# Patient Record
Sex: Female | Born: 1935 | Race: White | Hispanic: No | Smoking: Former smoker
Health system: Southern US, Community
[De-identification: ages and names within clinical notes are randomized; demographics above are authoritative.]

## PROBLEM LIST (undated history)

## (undated) DIAGNOSIS — Z8619 Personal history of other infectious and parasitic diseases: Secondary | ICD-10-CM

## (undated) DIAGNOSIS — R0600 Dyspnea, unspecified: Secondary | ICD-10-CM

## (undated) DIAGNOSIS — Z87442 Personal history of urinary calculi: Secondary | ICD-10-CM

## (undated) DIAGNOSIS — M1990 Unspecified osteoarthritis, unspecified site: Secondary | ICD-10-CM

## (undated) DIAGNOSIS — N189 Chronic kidney disease, unspecified: Secondary | ICD-10-CM

## (undated) DIAGNOSIS — G500 Trigeminal neuralgia: Secondary | ICD-10-CM

## (undated) DIAGNOSIS — K5090 Crohn's disease, unspecified, without complications: Secondary | ICD-10-CM

## (undated) DIAGNOSIS — E785 Hyperlipidemia, unspecified: Secondary | ICD-10-CM

## (undated) DIAGNOSIS — I509 Heart failure, unspecified: Secondary | ICD-10-CM

## (undated) DIAGNOSIS — I2510 Atherosclerotic heart disease of native coronary artery without angina pectoris: Secondary | ICD-10-CM

## (undated) DIAGNOSIS — F419 Anxiety disorder, unspecified: Secondary | ICD-10-CM

## (undated) DIAGNOSIS — I10 Essential (primary) hypertension: Secondary | ICD-10-CM

## (undated) DIAGNOSIS — N200 Calculus of kidney: Secondary | ICD-10-CM

## (undated) DIAGNOSIS — J449 Chronic obstructive pulmonary disease, unspecified: Secondary | ICD-10-CM

## (undated) DIAGNOSIS — K219 Gastro-esophageal reflux disease without esophagitis: Secondary | ICD-10-CM

## (undated) DIAGNOSIS — D649 Anemia, unspecified: Secondary | ICD-10-CM

## (undated) DIAGNOSIS — Z8489 Family history of other specified conditions: Secondary | ICD-10-CM

## (undated) DIAGNOSIS — I209 Angina pectoris, unspecified: Secondary | ICD-10-CM

## (undated) HISTORY — DX: Chronic obstructive pulmonary disease, unspecified: J44.9

## (undated) HISTORY — PX: OTHER SURGICAL HISTORY: SHX169

## (undated) HISTORY — PX: CHOLECYSTECTOMY: SHX55

## (undated) HISTORY — DX: Essential (primary) hypertension: I10

## (undated) HISTORY — PX: URETERAL STENT PLACEMENT: SHX822

## (undated) HISTORY — DX: Personal history of urinary calculi: Z87.442

## (undated) HISTORY — DX: Gastro-esophageal reflux disease without esophagitis: K21.9

## (undated) HISTORY — DX: Hyperlipidemia, unspecified: E78.5

## (undated) HISTORY — DX: Crohn's disease, unspecified, without complications: K50.90

## (undated) HISTORY — DX: Dyspnea, unspecified: R06.00

## (undated) HISTORY — PX: LITHOTRIPSY: SUR834

## (undated) HISTORY — DX: Atherosclerotic heart disease of native coronary artery without angina pectoris: I25.10

## (undated) HISTORY — PX: TOTAL ABDOMINAL HYSTERECTOMY: SHX209

## (undated) HISTORY — DX: Trigeminal neuralgia: G50.0

## (undated) HISTORY — DX: Personal history of other infectious and parasitic diseases: Z86.19

---

## 1997-09-24 ENCOUNTER — Emergency Department (HOSPITAL_COMMUNITY): Admission: EM | Admit: 1997-09-24 | Discharge: 1997-09-24 | Payer: Self-pay | Admitting: Emergency Medicine

## 1997-09-26 ENCOUNTER — Ambulatory Visit (HOSPITAL_COMMUNITY): Admission: RE | Admit: 1997-09-26 | Discharge: 1997-09-26 | Payer: Self-pay | Admitting: Urology

## 1998-07-12 ENCOUNTER — Emergency Department (HOSPITAL_COMMUNITY): Admission: EM | Admit: 1998-07-12 | Discharge: 1998-07-12 | Payer: Self-pay | Admitting: Emergency Medicine

## 1998-07-12 ENCOUNTER — Encounter: Payer: Self-pay | Admitting: Emergency Medicine

## 1998-12-16 ENCOUNTER — Encounter (INDEPENDENT_AMBULATORY_CARE_PROVIDER_SITE_OTHER): Payer: Self-pay | Admitting: Specialist

## 1998-12-16 ENCOUNTER — Encounter: Payer: Self-pay | Admitting: Gastroenterology

## 1998-12-16 ENCOUNTER — Ambulatory Visit (HOSPITAL_COMMUNITY): Admission: RE | Admit: 1998-12-16 | Discharge: 1998-12-16 | Payer: Self-pay | Admitting: Gastroenterology

## 1999-03-11 ENCOUNTER — Encounter: Admission: RE | Admit: 1999-03-11 | Discharge: 1999-03-11 | Payer: Self-pay | Admitting: Gastroenterology

## 1999-03-11 ENCOUNTER — Encounter: Payer: Self-pay | Admitting: Gastroenterology

## 1999-05-15 ENCOUNTER — Ambulatory Visit (HOSPITAL_COMMUNITY): Admission: RE | Admit: 1999-05-15 | Discharge: 1999-05-15 | Payer: Self-pay | Admitting: Gastroenterology

## 1999-05-15 ENCOUNTER — Encounter (INDEPENDENT_AMBULATORY_CARE_PROVIDER_SITE_OTHER): Payer: Self-pay | Admitting: Specialist

## 1999-07-03 ENCOUNTER — Encounter: Admission: RE | Admit: 1999-07-03 | Discharge: 1999-07-03 | Payer: Self-pay | Admitting: Gastroenterology

## 1999-07-03 ENCOUNTER — Encounter: Payer: Self-pay | Admitting: Gastroenterology

## 1999-11-17 ENCOUNTER — Encounter (HOSPITAL_COMMUNITY): Admission: RE | Admit: 1999-11-17 | Discharge: 2000-02-15 | Payer: Self-pay | Admitting: Internal Medicine

## 1999-12-22 ENCOUNTER — Encounter: Admission: RE | Admit: 1999-12-22 | Discharge: 1999-12-22 | Payer: Self-pay | Admitting: Gastroenterology

## 1999-12-22 ENCOUNTER — Encounter: Payer: Self-pay | Admitting: Gastroenterology

## 1999-12-31 ENCOUNTER — Encounter: Payer: Self-pay | Admitting: Gastroenterology

## 1999-12-31 ENCOUNTER — Encounter: Admission: RE | Admit: 1999-12-31 | Discharge: 1999-12-31 | Payer: Self-pay | Admitting: Gastroenterology

## 2000-02-16 ENCOUNTER — Encounter (HOSPITAL_COMMUNITY): Admission: RE | Admit: 2000-02-16 | Discharge: 2000-05-16 | Payer: Self-pay | Admitting: Internal Medicine

## 2000-12-06 ENCOUNTER — Ambulatory Visit (HOSPITAL_COMMUNITY): Admission: RE | Admit: 2000-12-06 | Discharge: 2000-12-07 | Payer: Self-pay | Admitting: *Deleted

## 2000-12-13 ENCOUNTER — Ambulatory Visit (HOSPITAL_COMMUNITY): Admission: RE | Admit: 2000-12-13 | Discharge: 2000-12-13 | Payer: Self-pay | Admitting: *Deleted

## 2000-12-17 ENCOUNTER — Encounter: Payer: Self-pay | Admitting: Cardiology

## 2000-12-17 ENCOUNTER — Inpatient Hospital Stay (HOSPITAL_COMMUNITY): Admission: EM | Admit: 2000-12-17 | Discharge: 2000-12-19 | Payer: Self-pay | Admitting: Emergency Medicine

## 2001-01-03 ENCOUNTER — Encounter: Payer: Self-pay | Admitting: Gastroenterology

## 2001-01-03 ENCOUNTER — Encounter: Admission: RE | Admit: 2001-01-03 | Discharge: 2001-01-03 | Payer: Self-pay | Admitting: Gastroenterology

## 2001-12-28 ENCOUNTER — Ambulatory Visit (HOSPITAL_COMMUNITY): Admission: RE | Admit: 2001-12-28 | Discharge: 2001-12-28 | Payer: Self-pay | Admitting: Internal Medicine

## 2002-01-04 ENCOUNTER — Encounter: Admission: RE | Admit: 2002-01-04 | Discharge: 2002-01-04 | Payer: Self-pay | Admitting: Gastroenterology

## 2002-01-04 ENCOUNTER — Encounter: Payer: Self-pay | Admitting: Gastroenterology

## 2002-07-27 ENCOUNTER — Encounter: Payer: Self-pay | Admitting: Internal Medicine

## 2002-07-27 ENCOUNTER — Ambulatory Visit (HOSPITAL_COMMUNITY): Admission: RE | Admit: 2002-07-27 | Discharge: 2002-07-27 | Payer: Self-pay | Admitting: Internal Medicine

## 2003-01-07 ENCOUNTER — Encounter: Admission: RE | Admit: 2003-01-07 | Discharge: 2003-01-07 | Payer: Self-pay | Admitting: Gastroenterology

## 2003-01-07 ENCOUNTER — Encounter: Payer: Self-pay | Admitting: Gastroenterology

## 2003-10-03 ENCOUNTER — Ambulatory Visit (HOSPITAL_COMMUNITY): Admission: RE | Admit: 2003-10-03 | Discharge: 2003-10-03 | Payer: Self-pay | Admitting: Interventional Cardiology

## 2003-10-10 ENCOUNTER — Inpatient Hospital Stay (HOSPITAL_BASED_OUTPATIENT_CLINIC_OR_DEPARTMENT_OTHER): Admission: RE | Admit: 2003-10-10 | Discharge: 2003-10-10 | Payer: Self-pay | Admitting: Interventional Cardiology

## 2004-01-10 ENCOUNTER — Encounter: Admission: RE | Admit: 2004-01-10 | Discharge: 2004-01-10 | Payer: Self-pay | Admitting: Gastroenterology

## 2004-04-27 ENCOUNTER — Ambulatory Visit: Payer: Self-pay | Admitting: Internal Medicine

## 2004-05-22 ENCOUNTER — Ambulatory Visit: Payer: Self-pay | Admitting: Internal Medicine

## 2004-07-09 ENCOUNTER — Ambulatory Visit: Payer: Self-pay | Admitting: Internal Medicine

## 2004-08-04 ENCOUNTER — Ambulatory Visit: Payer: Self-pay | Admitting: Internal Medicine

## 2004-09-18 ENCOUNTER — Ambulatory Visit: Payer: Self-pay | Admitting: Internal Medicine

## 2004-09-20 ENCOUNTER — Emergency Department (HOSPITAL_COMMUNITY): Admission: EM | Admit: 2004-09-20 | Discharge: 2004-09-20 | Payer: Self-pay | Admitting: Emergency Medicine

## 2004-11-13 ENCOUNTER — Ambulatory Visit: Payer: Self-pay | Admitting: Internal Medicine

## 2004-11-16 ENCOUNTER — Ambulatory Visit: Payer: Self-pay | Admitting: Cardiology

## 2004-11-16 ENCOUNTER — Ambulatory Visit: Payer: Self-pay | Admitting: Internal Medicine

## 2004-11-24 ENCOUNTER — Ambulatory Visit: Payer: Self-pay | Admitting: Internal Medicine

## 2005-01-22 ENCOUNTER — Encounter: Admission: RE | Admit: 2005-01-22 | Discharge: 2005-01-22 | Payer: Self-pay | Admitting: Gastroenterology

## 2005-02-08 ENCOUNTER — Ambulatory Visit: Payer: Self-pay | Admitting: Internal Medicine

## 2005-08-09 ENCOUNTER — Ambulatory Visit: Payer: Self-pay | Admitting: Internal Medicine

## 2006-01-26 ENCOUNTER — Encounter: Admission: RE | Admit: 2006-01-26 | Discharge: 2006-01-26 | Payer: Self-pay | Admitting: Gastroenterology

## 2006-02-07 ENCOUNTER — Ambulatory Visit: Payer: Self-pay | Admitting: Internal Medicine

## 2006-03-11 ENCOUNTER — Ambulatory Visit: Payer: Self-pay | Admitting: Internal Medicine

## 2006-09-07 ENCOUNTER — Ambulatory Visit: Payer: Self-pay | Admitting: Internal Medicine

## 2006-10-19 ENCOUNTER — Ambulatory Visit: Payer: Self-pay | Admitting: Internal Medicine

## 2007-02-01 ENCOUNTER — Encounter: Admission: RE | Admit: 2007-02-01 | Discharge: 2007-02-01 | Payer: Self-pay | Admitting: Gastroenterology

## 2007-03-21 ENCOUNTER — Encounter: Payer: Self-pay | Admitting: Internal Medicine

## 2007-03-21 DIAGNOSIS — I2510 Atherosclerotic heart disease of native coronary artery without angina pectoris: Secondary | ICD-10-CM

## 2007-03-21 DIAGNOSIS — K5090 Crohn's disease, unspecified, without complications: Secondary | ICD-10-CM | POA: Insufficient documentation

## 2007-03-21 DIAGNOSIS — J449 Chronic obstructive pulmonary disease, unspecified: Secondary | ICD-10-CM | POA: Insufficient documentation

## 2007-03-21 DIAGNOSIS — K219 Gastro-esophageal reflux disease without esophagitis: Secondary | ICD-10-CM

## 2007-03-21 DIAGNOSIS — J309 Allergic rhinitis, unspecified: Secondary | ICD-10-CM | POA: Insufficient documentation

## 2007-03-21 DIAGNOSIS — R0602 Shortness of breath: Secondary | ICD-10-CM

## 2007-03-21 DIAGNOSIS — I10 Essential (primary) hypertension: Secondary | ICD-10-CM

## 2007-04-21 ENCOUNTER — Ambulatory Visit: Payer: Self-pay | Admitting: Internal Medicine

## 2007-04-27 ENCOUNTER — Ambulatory Visit (HOSPITAL_COMMUNITY): Admission: RE | Admit: 2007-04-27 | Discharge: 2007-04-27 | Payer: Self-pay | Admitting: Urology

## 2007-04-30 ENCOUNTER — Emergency Department (HOSPITAL_COMMUNITY): Admission: EM | Admit: 2007-04-30 | Discharge: 2007-04-30 | Payer: Self-pay | Admitting: Emergency Medicine

## 2007-05-02 ENCOUNTER — Ambulatory Visit (HOSPITAL_BASED_OUTPATIENT_CLINIC_OR_DEPARTMENT_OTHER): Admission: RE | Admit: 2007-05-02 | Discharge: 2007-05-02 | Payer: Self-pay | Admitting: Urology

## 2007-05-02 ENCOUNTER — Other Ambulatory Visit: Payer: Self-pay | Admitting: Urology

## 2007-05-17 ENCOUNTER — Telehealth (INDEPENDENT_AMBULATORY_CARE_PROVIDER_SITE_OTHER): Payer: Self-pay | Admitting: *Deleted

## 2007-06-09 ENCOUNTER — Telehealth (INDEPENDENT_AMBULATORY_CARE_PROVIDER_SITE_OTHER): Payer: Self-pay | Admitting: *Deleted

## 2007-09-20 ENCOUNTER — Observation Stay (HOSPITAL_COMMUNITY): Admission: AD | Admit: 2007-09-20 | Discharge: 2007-09-21 | Payer: Self-pay | Admitting: Interventional Cardiology

## 2007-10-20 ENCOUNTER — Ambulatory Visit: Payer: Self-pay | Admitting: Internal Medicine

## 2007-12-07 ENCOUNTER — Encounter: Payer: Self-pay | Admitting: Internal Medicine

## 2007-12-07 ENCOUNTER — Telehealth (INDEPENDENT_AMBULATORY_CARE_PROVIDER_SITE_OTHER): Payer: Self-pay | Admitting: *Deleted

## 2007-12-28 ENCOUNTER — Encounter: Admission: RE | Admit: 2007-12-28 | Discharge: 2007-12-28 | Payer: Self-pay | Admitting: Internal Medicine

## 2008-01-03 ENCOUNTER — Telehealth: Payer: Self-pay | Admitting: Internal Medicine

## 2008-02-02 ENCOUNTER — Encounter: Admission: RE | Admit: 2008-02-02 | Discharge: 2008-02-02 | Payer: Self-pay | Admitting: Gastroenterology

## 2008-05-07 ENCOUNTER — Telehealth (INDEPENDENT_AMBULATORY_CARE_PROVIDER_SITE_OTHER): Payer: Self-pay | Admitting: *Deleted

## 2008-10-01 ENCOUNTER — Emergency Department (HOSPITAL_COMMUNITY): Admission: EM | Admit: 2008-10-01 | Discharge: 2008-10-02 | Payer: Self-pay | Admitting: Emergency Medicine

## 2008-10-02 ENCOUNTER — Telehealth (INDEPENDENT_AMBULATORY_CARE_PROVIDER_SITE_OTHER): Payer: Self-pay | Admitting: *Deleted

## 2008-10-08 ENCOUNTER — Ambulatory Visit: Payer: Self-pay | Admitting: Internal Medicine

## 2008-10-22 ENCOUNTER — Ambulatory Visit: Payer: Self-pay | Admitting: Internal Medicine

## 2008-10-25 ENCOUNTER — Encounter: Payer: Self-pay | Admitting: Internal Medicine

## 2008-12-19 ENCOUNTER — Telehealth (INDEPENDENT_AMBULATORY_CARE_PROVIDER_SITE_OTHER): Payer: Self-pay | Admitting: *Deleted

## 2008-12-20 ENCOUNTER — Encounter: Payer: Self-pay | Admitting: Internal Medicine

## 2009-02-05 ENCOUNTER — Encounter: Admission: RE | Admit: 2009-02-05 | Discharge: 2009-02-05 | Payer: Self-pay | Admitting: Internal Medicine

## 2009-02-25 ENCOUNTER — Ambulatory Visit: Payer: Self-pay | Admitting: Internal Medicine

## 2009-02-28 ENCOUNTER — Encounter: Payer: Self-pay | Admitting: Internal Medicine

## 2009-03-04 ENCOUNTER — Telehealth: Payer: Self-pay | Admitting: Internal Medicine

## 2009-03-05 LAB — CONVERTED CEMR LAB
Basophils Absolute: 0 10*3/uL (ref 0.0–0.1)
Eosinophils Absolute: 0.1 10*3/uL (ref 0.0–0.7)
Lymphocytes Relative: 22.4 % (ref 12.0–46.0)
MCHC: 34.5 g/dL (ref 30.0–36.0)
MCV: 90.5 fL (ref 78.0–100.0)
Monocytes Absolute: 0.5 10*3/uL (ref 0.1–1.0)
Neutrophils Relative %: 67.6 % (ref 43.0–77.0)
Platelets: 140 10*3/uL — ABNORMAL LOW (ref 150.0–400.0)

## 2009-03-13 ENCOUNTER — Ambulatory Visit: Payer: Self-pay | Admitting: Internal Medicine

## 2009-04-02 ENCOUNTER — Encounter: Payer: Self-pay | Admitting: Internal Medicine

## 2009-04-12 ENCOUNTER — Ambulatory Visit: Payer: Self-pay | Admitting: Internal Medicine

## 2009-04-12 ENCOUNTER — Inpatient Hospital Stay (HOSPITAL_COMMUNITY): Admission: EM | Admit: 2009-04-12 | Discharge: 2009-04-16 | Payer: Self-pay | Admitting: Emergency Medicine

## 2009-04-15 ENCOUNTER — Encounter: Payer: Self-pay | Admitting: Internal Medicine

## 2009-04-28 ENCOUNTER — Telehealth (INDEPENDENT_AMBULATORY_CARE_PROVIDER_SITE_OTHER): Payer: Self-pay | Admitting: *Deleted

## 2009-05-13 ENCOUNTER — Ambulatory Visit: Payer: Self-pay | Admitting: Internal Medicine

## 2009-05-26 ENCOUNTER — Telehealth: Payer: Self-pay | Admitting: Internal Medicine

## 2009-05-27 ENCOUNTER — Telehealth (INDEPENDENT_AMBULATORY_CARE_PROVIDER_SITE_OTHER): Payer: Self-pay | Admitting: *Deleted

## 2009-08-04 ENCOUNTER — Telehealth: Payer: Self-pay | Admitting: Internal Medicine

## 2009-09-11 ENCOUNTER — Ambulatory Visit: Payer: Self-pay | Admitting: Internal Medicine

## 2009-10-02 ENCOUNTER — Telehealth (INDEPENDENT_AMBULATORY_CARE_PROVIDER_SITE_OTHER): Payer: Self-pay | Admitting: *Deleted

## 2009-10-08 ENCOUNTER — Encounter: Payer: Self-pay | Admitting: Internal Medicine

## 2009-10-21 ENCOUNTER — Encounter: Payer: Self-pay | Admitting: Internal Medicine

## 2010-01-31 ENCOUNTER — Emergency Department (HOSPITAL_COMMUNITY): Admission: EM | Admit: 2010-01-31 | Discharge: 2010-01-31 | Payer: Self-pay | Admitting: Emergency Medicine

## 2010-02-25 ENCOUNTER — Encounter: Admission: RE | Admit: 2010-02-25 | Discharge: 2010-02-25 | Payer: Self-pay | Admitting: Internal Medicine

## 2010-03-10 ENCOUNTER — Ambulatory Visit: Payer: Self-pay | Admitting: Internal Medicine

## 2010-05-12 NOTE — Consult Note (Signed)
Summary: [facility]   Imported By: Sherian Rein 04/22/2009 09:34:07  _____________________________________________________________________  External Attachment:    Type:   Image     Comment:   External Document

## 2010-05-12 NOTE — Assessment & Plan Note (Signed)
Summary: 5 months/ mbw   Primary Provider/Referring Provider:  R. Johnella Moloney  CC:  5 month follow up visit-"SOB and tightness with hot air"..  History of Present Illness: 10/22/08- Allergic rhinitis, COPD Now breathing/ dyspnea  much better, staying indoors. Ginette Pitman took a long time to clear after doxycycline.  Does c/o persistent cough- needs hydromet. This dry cough began as she started Lisinopril- replacing Benicar which was too expensive. Little sputum but dry hacking cough til she almost retches. last CXR in June at [facility].  February 25, 2009- Allergic rhinitis, COPD Notices huffing and puffing waliking across yard and wanted to discuss home oxygen. Occasional angina relieved  by NTG and not more frequent.  Very little cough or wheeze and no routine congestion. Changed off lisinopril - helped her cough. left foot will sometimes swell by nighttime. Not aware if anemic but is on B12 shots.  April 15, 2009 seen for Dr Katrinka Blazing as inpt for hypoxemia, see Dictated note. Severe GERD  noted  May 13, 2009- Allergic rhinitis, COPD Hosp early january with chest pain and dyspnea unexplained . Cardiac w/u neg with stable patent coroinary grafts. She had some COPD and GERD contribution.  She notices occasional chest tightness eased by putting her oxygen on. Severe chest pain radiating to back has not returned. Still a little dry cough, not much different from ususal. Feet usually swell a little, left greater than right. She has been sleeping with oxygen at 2 but isn't convinced she needs it.  September 11, 2009- Allergic rhintis, COPD Doing well now. Spring pollen caused watery eyes, itch and sneeze. She had stoped allergy vaccine when she had shingles. Now takes an unknown otc allergy tablet about twice daily. Asks price relief for Spiriva and Symbicort. Sometimes wheeze. Coughs, productive clear mucus.Outside heat is bothering her.    Preventive Screening-Counseling &  Management  Alcohol-Tobacco     Smoking Status: quit > 6 months  Current Medications (verified): 1)  Xopenex Hfa 45 Mcg/act  Aero (Levalbuterol Tartrate) .... As Needed 2)  Symbicort 160-4.5 Mcg/act  Aero (Budesonide-Formoterol Fumarate) .... 2 Puffs Two Times A Day 3)  Zocor 20 Mg  Tabs (Simvastatin) .[name].. 1 By Mouth Daily 4)  Pepcid 20 Mg Tabs (Famotidine) .... Take 2 By Mouth Two Times A Day 5)  Klor-Con M20 20 Meq  Tbcr (Potassium Chloride Crys Cr) .... 2 By Mouth Daily 6)  Multivitamins   Tabs (Multiple Vitamin) .[name].. 1 By Mouth Daily 7)  B12 Injection .[name].. 1 Every Month 8)  Norvasc 10 Mg Tabs (Amlodipine Besylate) .... Take 1 By Mouth Once Daily 9)  Spiriva Handihaler 18 Mcg  Caps (Tiotropium Bromide Monohydrate) .... Inhale One Capsule Daily. 10)  Benicar 20 Mg Tabs (Olmesartan Medoxomil) .... Take 1 By Mouth Once Daily 11)  Isosorbide Mononitrate Cr 30 Mg Xr24h-Tab (Isosorbide Mononitrate) .... Take 1 By Mouth Once Daily  Allergies (verified): 1)  ! Codeine  Past History:  Past Medical History: Last updated: 04/15/2009 DYSPNEA (ICD-786.05) ESOPHAGEAL REFLUX (ICD-530.81)     - Barium swallow 04/15/2008 positive for moderate to severe GERD CAD (ICD-414.00) Stents HYPERTENSION (ICD-401.9)    - Intolerant to ACE CROHN'S DISEASE (ICD-555.9) ALLERGIC RHINITIS (ICD-477.9) COPD (ICD-496)    - PFT's 03/13/2009 FEV1 1.27 (65%) ratio 32 no better after B2, DLC0 48%    - CT Chest April 15, 2009 : mod emphysema only ALLERGIC RHINITIS     - Sinus CT April 15, 2009 : chronic diffuse  thickening without a/f levels Kidney Stone Shingles- left face, trigeminal neuralgia  Past Surgical History: Last updated: 10/20/2007  CoronaryStents lithotripsy ureteral stent Cholecystectomy Hysterectonmy Tonsillectomy Partial resections large and small bowel for Crohn's breast lumps benign  Family History: Last updated: 10/22/2008 Sister - alive breast cancer  Sister - died COPD Mother-  died CVA Father- died prostate cancer Brother- died prostate cancer Brother -died MI  Social History: Last updated: 10/20/2007 Widow Part-time - [employer] Patient states former smoker.   Risk Factors: Smoking Status: quit > 6 months (09/11/2009)  Social History: Smoking Status:  quit > 6 months  Review of Systems      See HPI       The patient complains of shortness of breath with activity, productive cough, and chest pain.  The patient denies shortness of breath at rest, non-productive cough, coughing up blood, irregular heartbeats, acid heartburn, indigestion, loss of appetite, weight change, abdominal pain, difficulty swallowing, sore throat, tooth/dental problems, headaches, and sneezing.         Rare NTG  Vital Signs:  Patient profile:   77 year old female Height:      65 inches Weight:      146 pounds BMI:     24.38 O2 Sat:      97 % on Room air Pulse rate:   75 / minute BP sitting:   122 / 74  (left arm) Cuff size:   regular  Vitals Entered By: Reynaldo Minium CMA (September 11, 2009 10:08 AM)  O2 Flow:  Room air  Physical Exam  Additional Exam:  General: A/Ox3; pleasant and cooperative, NAD, SKIN: no rash, lesions NODES: no lymphadenopathy HEENT: [place]/AT, EOM- WNL, Conjuctivae- clear, PERRLA, TM-WNL, Nose- clear, Throat- clear and wnl, Mallampati  III NECK: Supple w/ fair ROM, JVD- none, normal carotid impulses w/o bruits Thyroid-  CHEST: fine dry crackles. Note resting sat here is 97% on room air. HEART: RRR, no m/g/r heard ABDOMEN: Soft and nl;  WJX:BJYN, nl pulses,no edema  NEURO: Grossly intact to observation      Impression & Recommendations:  Problem # 1:  ALLERGIC RHINITIS (ICD-477.9)  Good control now that she is past the Spring pollens. We could try other therapies or reconsider her allergy vaccine if needed going forward.  Reviewed old allergy skin test results.  Orders: Est. Patient Level IV (82956)  Problem # 2:  COPD  (ICD-496) Stable persistent exertional dyspnea and mild chronic bronchitic cough. Cost of meds is a problem.  We will try taking Spiriva e very other day. Try changing Symbicort to 80/4.5 taken once daily. Stay in airconditioning during worst air quality days.  Medications Added to Medication List This Visit: 1)  Symbicort 80-4.5 Mcg/act Aero (Budesonide-formoterol fumarate) .... 2 puffs and rinse, twice daily 2)  Isosorbide Mononitrate Cr 30 Mg Xr24h-tab (Isosorbide mononitrate) .... Take 1 by mouth once daily  Patient Instructions: 1)  Please schedule a follow-up appointment in 6 months. 2)  Try sample/ script Symbicort 80/4.5- 3)  Try reducing it to 2 puffs once daily if you feel well and stable. Otherwise use it twice daily. rinse mouth. 4)  Try taking Spiriva every other day if you feel stable.  Prescriptions: SYMBICORT 80-4.5 MCG/ACT AERO (BUDESONIDE-FORMOTEROL FUMARATE) 2 puffs and rinse, twice daily  #1 x prn   Entered and Authorized by:   Waymon Budge MD   Signed by:   Waymon Budge MD on 09/11/2009   Method used:  Print then Give to Patient   RxID:   [id number]

## 2010-05-12 NOTE — Assessment & Plan Note (Signed)
Summary: Pulmonary consultation as inpt/ ? now needs 02   Primary Provider/Referring Provider:  R. Johnella Moloney   History of Present Illness: 10/20/07-77 year old woman returning for follow-up of allergic rhinitis and COPD.  Had chest pain with MI ruled out by Dr. Katrinka Blazing after heart catheter.  Stents are okay.  Had kidney stones in January and residual facial pain from shingles.  Her breathing is okay with exertional dyspnea, but stable.  It does not wake her.  Minor daily cough is no longer severe.  Last PFT was in 2006.  We discussed her experience with Spiriva.  Sometimes there is no powder in the capsule.  10/08/08- Allergic rhinitis, COPD Has had 2 colds this month. Prior had done well over past year. Went to ER 1 week ago for persistent cough and congestion. They said she was close to pneumonia. She was having minimal fever, much poroductive cough- green and yellow from chest and nose. They did cxr, gave neb, prednisone and doxycycline. Doing better today. Mucinex is helping. CXR 10/01/08- NAD.  10/22/08- Allergic rhinitis, COPD Now breathing/ dyspnea  much better, staying indoors. Ginette Pitman took a long time to clear after doxycycline.  Does c/o persistent cough- needs hydromet. This dry cough began as she started Lisinopril- replacing Benicar which was too expensive. Little sputum but dry hacking cough til she almost retches. last CXR in June at [facility].  February 25, 2009- Allergic rhinitis, COPD Notices huffing and puffing waliking across yard and wanted to discuss home oxygen. Occasional angina relieved  by NTG and not more frequent.  Very little cough or wheeze and no routine congestion. Changed off lisinopril - helped her cough. left foot will sometimes swell by nighttime. Not aware if anemic but is on B12 shots.  April 15, 2009 seen for Dr Katrinka Blazing as inpt for hypoxemia, see Dictated note. Severe GERD  noted   Current Medications (verified): 1)  Xopenex Hfa 45 Mcg/act  Aero  (Levalbuterol Tartrate) .... As Needed 2)  Symbicort 160-4.5 Mcg/act  Aero (Budesonide-Formoterol Fumarate) .... 2 Puffs Two Times A Day 3)  Zocor 20 Mg  Tabs (Simvastatin) .[name].. 1 By Mouth Daily 4)  Pepcid Ac 10 Mg  Tabs (Famotidine) .[name].. 1 By Mouth Once Daily - Three Times A Day 5)  Klor-Con M20 20 Meq  Tbcr (Potassium Chloride Crys Cr) .... 2 By Mouth Daily 6)  Multivitamins   Tabs (Multiple Vitamin) .[name].. 1 By Mouth Daily 7)  B12 Injection .[name].. 1 Every Month 8)  Norvasc 5 Mg  Tabs (Amlodipine Besylate) .[name].. 1 By Mouth Daily 9)  Spiriva Handihaler 18 Mcg  Caps (Tiotropium Bromide Monohydrate) .... Inhale One Capsule Daily. 10)  Benicar 20 Mg Tabs (Olmesartan Medoxomil) .... Take 1 By Mouth Once Daily  Allergies (verified): 1)  ! Codeine  Past History:  Past Medical History: DYSPNEA (ICD-786.05) ESOPHAGEAL REFLUX (ICD-530.81)     - Barium swallow 04/15/2008 positive for moderate to severe GERD CAD (ICD-414.00) Stents HYPERTENSION (ICD-401.9)    - Intolerant to ACE CROHN'S DISEASE (ICD-555.9) ALLERGIC RHINITIS (ICD-477.9) COPD (ICD-496)    - PFT's 03/13/2009 FEV1 1.27 (65%) ratio 32 no better after B2, DLC0 48%    - CT Chest April 15, 2009 : mod emphysema only ALLERGIC RHINITIS     - Sinus CT April 15, 2009 : chronic diffuse thickening without a/f levels Kidney Stone Shingles- left face, trigeminal neuralgia  Family History: Reviewed history from 10/22/2008 and no changes required. Sister - alive breast cancer  Sister -  died COPD Mother- died CVA Father- died prostate cancer Brother- died prostate cancer Brother -died MI  Social History: Reviewed history from 10/20/2007 and no changes required. Widow Part-time - [employer] Patient states former smoker.    Impression & Recommendations:  Problem # 1:  COPD (ICD-496)  Problem # 2:  ESOPHAGEAL REFLUX (ICD-530.81)  Her updated medication list for this problem includes:    Pepcid Ac 10 Mg Tabs  (Famotidine) .[name]... 1 by mouth once daily - three times a day

## 2010-05-12 NOTE — Assessment & Plan Note (Signed)
Summary: 6 months/apc   Primary Provider/Referring Provider:  R. Johnella Moloney  CC:  6 mth. f/u, sob slightly worse, cough occass. yellow, occass. wheeze, and went to [facility] 1 mth ago for sob.  History of Present Illness:  May 13, 2009- Allergic rhinitis, COPD Hosp early january with chest pain and dyspnea unexplained . Cardiac w/u neg with stable patent coroinary grafts. She had some COPD and GERD contribution.  She notices occasional chest tightness eased by putting her oxygen on. Severe chest pain radiating to back has not returned. Still a little dry cough, not much different from ususal. Feet usually swell a little, left greater than right. She has been sleeping with oxygen at 2 but isn't convinced she needs it.  September 11, 2009- Allergic rhintis, COPD Doing well now. Spring pollen caused watery eyes, itch and sneeze. She had stoped allergy vaccine when she had shingles. Now takes an unknown otc allergy tablet about twice daily. Asks price relief for Spiriva and Symbicort. Sometimes wheeze. Coughs, productive clear mucus.Outside heat is bothering her.  March 10, 2010- Allergic rhintis, COPD CC6 mth. f/u,sob slightly worse,cough occass. yellow,occass. wheeze,went to [facility] 1 mth ago for sob. Hosp 1 month ago SOB, went to [facility] ER, Rx'd prednisone, abx, nebs there. Never back to her baseline with persistent chest congestion and episodic coughing. . More short of breath w/ exertion. Some cough, wheeze and yellow, but no fever, chills or sweat since the fever she had at ER.  Xopenex is too expensive. Will get flu vax soon w/ Dr Kevan Ny.  Preventive Screening-Counseling & Management  Alcohol-Tobacco     Smoking Status: quit     Year Quit: 20 yrs. ago  Current Medications (verified): 1)  Xopenex Hfa 45 Mcg/act  Aero (Levalbuterol Tartrate) .... As Needed 2)  Symbicort 160-4.5 Mcg/act  Aero (Budesonide-Formoterol Fumarate) .... 2 Puffs Two Times A Day 3)  Zocor 20 Mg  Tabs (Simvastatin)  .[name].. 1 By Mouth Daily 4)  Klor-Con M20 20 Meq  Tbcr (Potassium Chloride Crys Cr) .... 2 By Mouth Daily 5)  Multivitamins   Tabs (Multiple Vitamin) .[name].. 1 By Mouth Daily 6)  B12 Injection .[name].. 1 Every Month 7)  Norvasc 10 Mg Tabs (Amlodipine Besylate) .... Take 1 By Mouth Once Daily 8)  Spiriva Handihaler 18 Mcg  Caps (Tiotropium Bromide Monohydrate) .... Inhale One Capsule Daily. 9)  Benicar 20 Mg Tabs (Olmesartan Medoxomil) .... Take 1 By Mouth Once Daily 10)  Isosorbide Mononitrate Cr 30 Mg Xr24h-Tab (Isosorbide Mononitrate) .... Take 1 By Mouth Once Daily 11)  Omeprazole 20 Mg Cpdr (Omeprazole) .... Take 1 Capsule Once Daily By Mouth  Allergies: 1)  ! Codeine 2)  ! Lisinopril  Past History:  Past Medical History: Last updated: 04/15/2009 DYSPNEA (ICD-786.05) ESOPHAGEAL REFLUX (ICD-530.81)     - Barium swallow 04/15/2008 positive for moderate to severe GERD CAD (ICD-414.00) Stents HYPERTENSION (ICD-401.9)    - Intolerant to ACE CROHN'S DISEASE (ICD-555.9) ALLERGIC RHINITIS (ICD-477.9) COPD (ICD-496)    - PFT's 03/13/2009 FEV1 1.27 (65%) ratio 32 no better after B2, DLC0 48%    - CT Chest April 15, 2009 : mod emphysema only ALLERGIC RHINITIS     - Sinus CT April 15, 2009 : chronic diffuse thickening without a/f levels Kidney Stone Shingles- left face, trigeminal neuralgia  Past Surgical History: Last updated: 10/20/2007  CoronaryStents lithotripsy ureteral stent Cholecystectomy Hysterectonmy Tonsillectomy Partial resections large and small bowel for Crohn's breast lumps benign  Family History:  Last updated: 10/22/2008 Sister - alive breast cancer  Sister - died COPD Mother- died CVA Father- died prostate cancer Brother- died prostate cancer Brother -died MI  Social History: Last updated: 10/20/2007 Widow Part-time - [employer] Patient states former smoker.   Risk Factors: Smoking Status: quit (03/10/2010)  Social History: Smoking  Status:  quit  Review of Systems      See HPI       The patient complains of shortness of breath with activity and productive cough.  The patient denies shortness of breath at rest, coughing up blood, chest pain, irregular heartbeats, acid heartburn, indigestion, loss of appetite, weight change, abdominal pain, difficulty swallowing, sore throat, tooth/dental problems, headaches, nasal congestion/difficulty breathing through nose, sneezing, and rash.    Vital Signs:  Patient profile:   77 year old female Height:      65 inches Weight:      142.38 pounds O2 Sat:      91 % on Room air Pulse rate:   83 / minute BP sitting:   148 / 70  (left arm) Cuff size:   regular  Vitals Entered By: Elray Buba RN (March 10, 2010 10:58 AM)  O2 Flow:  Room air CC: 6 mth. f/u,sob slightly worse,cough occass. yellow,occass. wheeze,went to [facility] 1 mth ago for sob Is Patient Diabetic? No Comments Medications reviewed with patient ,phone # verfied.Elray Buba RN  March 10, 2010 10:59 AM    Physical Exam  Additional Exam:  General: A/Ox3; pleasant and cooperative, NAD, medium build SKIN: no rash, lesions NODES: no lymphadenopathy HEENT: [place]/AT, EOM- WNL, Conjuctivae- clear, PERRLA, TM-WNL, Nose- clear, Throat- clear and wnl, Mallampati  III NECK: Supple w/ fair ROM, JVD- none, normal carotid impulses w/o bruits Thyroid-  CHEST: fine dry crackles. Note resting sat here is 91% on room air. Diminished breath sounds. HEART: RRR, no m/g/r heard ABDOMEN: Soft and nl;  BJY:NWGN, nl pulses,no edema  NEURO: Grossly intact to observation      Impression & Recommendations:  Problem # 1:  COPD (ICD-496) Acute exacerbaation of COPD. We will get her CXR report from ER vixsit. She needs sample Spiriva, albuterol replacement, and we will try replacing Symbicort w/ Dulera 200-5, neb and depo.   Problem # 2:  ESOPHAGEAL REFLUX (ICD-530.81) We reviewed reflux symptoms, consoideroing if her bronchitis  could be a result of reflux events, perhaps in sleep. The following medications were removed from the medication list:    Pepcid 20 Mg Tabs (Famotidine) .[name]... Take 2 by mouth two times a day Her updated medication list for this problem includes:    Omeprazole 20 Mg Cpdr (Omeprazole) .[name]... Take 1 capsule once daily by mouth  Medications Added to Medication List This Visit: 1)  Proair Hfa 108 (90 Base) Mcg/act Aers (Albuterol sulfate) .... 2 puffs four times a day as needed rescue inhaler 2)  Dulera 200-5 Mcg/act Aero (Mometasone furo-formoterol fum) .... 2 puffs and rinse mouth well, twice daily 3)  Omeprazole 20 Mg Cpdr (Omeprazole) .... Take 1 capsule once daily by mouth  Other Orders: Est. Patient Level IV (56213) Depo- Medrol 80mg  (J1040) Admin of Therapeutic Inj  intramuscular or subcutaneous (08657) Nebulizer Tx (84696)  Patient Instructions: 1)  Please schedule a follow-up appointment in 2 months. 2)  Sample and script Dulera 200-5, to use for now instead of Symbicort.       2 puffs and rinse mouth, twice daily 3)  Sample Spiriva 1 daily 4)  Sample  and script Proair rescue inhaler  2 puffs four times a day as needed  5)  Neb xop 1.25 depo 80 Prescriptions: PROAIR HFA 108 (90 BASE) MCG/ACT AERS (ALBUTEROL SULFATE) 2 puffs four times a day as needed rescue inhaler  #1 x prn   Entered and Authorized by:   Waymon Budge MD   Signed by:   Waymon Budge MD on 03/10/2010   Method used:   Print then Give to Patient   RxID:   [id number] DULERA 200-5 MCG/ACT AERO (MOMETASONE FURO-FORMOTEROL FUM) 2 puffs and rinse mouth well, twice daily  #1 x prn   Entered and Authorized by:   Waymon Budge MD   Signed by:   Waymon Budge MD on 03/10/2010   Method used:   Print then Give to Patient   RxID:   [id number]    Immunization History:  Influenza Immunization History:    Influenza:  historical (01/10/2009)  Pneumovax Immunization History:    Pneumovax:  historical  (01/10/2009)    Medication Administration  Injection # 1:    Medication: Depo- Medrol 80mg     Diagnosis: COPD (ICD-496)    Route: IM    Site: RUOQ gluteus    Exp Date: 08/2012    Lot #: [id number]    Mfr: Pharmacia    Patient tolerated injection without complications    Given by: Elray Buba RN (March 10, 2010 11:45 AM)  Medication # 1:    Medication: Xopenex 1.25mg     Diagnosis: COPD (ICD-496)    Dose: 1 vial    Route: inhaled    Exp Date: 08-12    Lot #: [id number]    Mfr: SEPRACOR    Patient tolerated medication without complications    Given by: Elray Buba RN (March 10, 2010 11:47 AM)  Orders Added: 1)  Est. Patient Level IV [52841] 2)  Depo- Medrol 80mg  [J1040] 3)  Admin of Therapeutic Inj  intramuscular or subcutaneous [96372] 4)  Nebulizer Tx [32440]

## 2010-05-12 NOTE — Progress Notes (Signed)
Summary: prescript  Phone Note Call from Patient   Caller: Patient Call For: young Summary of Call: need prescript for symbicort and spiriva called to pharmacy [facility] Initial call taken by: Rickard Patience,  August 04, 2009 1:56 PM  Follow-up for Phone Call        rx sent. pt request 90 day supply. Carron Curie CMA  August 04, 2009 4:12 PM     Prescriptions: SPIRIVA HANDIHALER 18 MCG  CAPS (TIOTROPIUM BROMIDE MONOHYDRATE) Inhale one capsule daily.  #90 x 3   Entered by:   Carron Curie CMA   Authorized by:   Waymon Budge MD   Signed by:   Carron Curie CMA on 08/04/2009   Method used:   Electronically to        [facility] [address] (retail)       [address] [place], [place]  [address]       Ph: [phone]       Fax: [phone]   RxID:   [id number] SYMBICORT 160-4.5 MCG/ACT  AERO (BUDESONIDE-FORMOTEROL FUMARATE) 2 puffs two times a day  #3 x 3   Entered by:   Carron Curie CMA   Authorized by:   Waymon Budge MD   Signed by:   Carron Curie CMA on 08/04/2009   Method used:   Electronically to        [facility] [address] (retail)       [address] [place], [place]  [address]       Ph: [phone]       Fax: [phone]   RxID:   [id number]

## 2010-05-12 NOTE — Progress Notes (Signed)
Summary: antibiotics  Phone Note Other Incoming Call back at [phone]   Caller: [facility]stephanie Summary of Call: Pt had one round of antibiotics, but now she's having increased sinus headaches and copia mucous. Should she have another round of antibiotics, please advise. [facility] Initial call taken by: Darletta Moll,  April 28, 2009 10:28 AM  Follow-up for Phone Call        Spoke with nurse at [facility].  She states that pt finished zithromax a few days ago and is now having increased sinus drainage and facial pressure/HA.  Would like to know if pt needs more abx called in.  Please advise thanks allergic to codiene Vernie Murders  April 28, 2009 10:47 AM   Additional Follow-up for Phone Call Additional follow up Details #1::        Per CD[facility]- give Biaxin 500mg  #14 take 1 by mouth two times a day after meals. no refills.Reynaldo Minium CMA  April 28, 2009 12:05 PM     Additional Follow-up for Phone Call Additional follow up Details #2::    Rx was sent to pharm.  Spoke with Judeth Cornfield and made aware this was done and she states that she will inform pt. Follow-up by: Vernie Murders,  April 28, 2009 12:09 PM  New/Updated Medications: BIAXIN 500 MG TABS (CLARITHROMYCIN) 1 by mouth two times a day after meals Prescriptions: BIAXIN 500 MG TABS (CLARITHROMYCIN) 1 by mouth two times a day after meals  #14 x 0   Entered by:   Vernie Murders   Authorized by:   Waymon Budge MD   Signed by:   Vernie Murders on 04/28/2009   Method used:   Electronically to        [facility]* (retail)       [address]       [place], [place]  [address]       Ph: [phone]       Fax: [phone]   RxID:   [id number]

## 2010-05-12 NOTE — Assessment & Plan Note (Signed)
Summary: ROV/ MBW   Primary Provider/Referring Provider:  R. Johnella Moloney  CC:  Follow up visit.  History of Present Illness: 10/22/08- Allergic rhinitis, COPD Now breathing/ dyspnea  much better, staying indoors. Ginette Pitman took a long time to clear after doxycycline.  Does c/o persistent cough- needs hydromet. This dry cough began as she started Lisinopril- replacing Benicar which was too expensive. Little sputum but dry hacking cough til she almost retches. last CXR in June at [facility].  February 25, 2009- Allergic rhinitis, COPD Notices huffing and puffing waliking across yard and wanted to discuss home oxygen. Occasional angina relieved  by NTG and not more frequent.  Very little cough or wheeze and no routine congestion. Changed off lisinopril - helped her cough. left foot will sometimes swell by nighttime. Not aware if anemic but is on B12 shots.  April 15, 2009 seen for Dr Katrinka Blazing as inpt for hypoxemia, see Dictated note. Severe GERD  noted  May 13, 2009- Allergic rhinitis, COPD Hosp early january with chest pain and dyspnea unexplained . Cardiac w/u neg with stable patent coroinary grafts. She had some COPD and GERD contribution.  She notices occasional chest tightness eased by putting her oxygen on. Severe chest pain radiating to back has not returned. Still a little dry cough, not much different from ususal. Feet usually swell a little, left greater than right. She has been sleeping with oxygen at 2 but isn't convinced she needs it.  Current Medications (verified): 1)  Xopenex Hfa 45 Mcg/act  Aero (Levalbuterol Tartrate) .... As Needed 2)  Symbicort 160-4.5 Mcg/act  Aero (Budesonide-Formoterol Fumarate) .... 2 Puffs Two Times A Day 3)  Zocor 20 Mg  Tabs (Simvastatin) .[name].. 1 By Mouth Daily 4)  Pepcid 20 Mg Tabs (Famotidine) .... Take 2 By Mouth Two Times A Day 5)  Klor-Con M20 20 Meq  Tbcr (Potassium Chloride Crys Cr) .... 2 By Mouth Daily 6)  Multivitamins   Tabs (Multiple  Vitamin) .[name].. 1 By Mouth Daily 7)  B12 Injection .[name].. 1 Every Month 8)  Norvasc 10 Mg Tabs (Amlodipine Besylate) .... Take 1 By Mouth Once Daily 9)  Spiriva Handihaler 18 Mcg  Caps (Tiotropium Bromide Monohydrate) .... Inhale One Capsule Daily. 10)  Benicar 20 Mg Tabs (Olmesartan Medoxomil) .... Take 1 By Mouth Once Daily 11)  Biaxin 500 Mg Tabs (Clarithromycin) .[name].. 1 By Mouth Two Times A Day After Meals  Allergies (verified): 1)  ! Codeine  Past History:  Past Medical History: Last updated: 04/15/2009 DYSPNEA (ICD-786.05) ESOPHAGEAL REFLUX (ICD-530.81)     - Barium swallow 04/15/2008 positive for moderate to severe GERD CAD (ICD-414.00) Stents HYPERTENSION (ICD-401.9)    - Intolerant to ACE CROHN'S DISEASE (ICD-555.9) ALLERGIC RHINITIS (ICD-477.9) COPD (ICD-496)    - PFT's 03/13/2009 FEV1 1.27 (65%) ratio 32 no better after B2, DLC0 48%    - CT Chest April 15, 2009 : mod emphysema only ALLERGIC RHINITIS     - Sinus CT April 15, 2009 : chronic diffuse thickening without a/f levels Kidney Stone Shingles- left face, trigeminal neuralgia  Past Surgical History: Last updated: 10/20/2007  CoronaryStents lithotripsy ureteral stent Cholecystectomy Hysterectonmy Tonsillectomy Partial resections large and small bowel for Crohn's breast lumps benign  Family History: Last updated: 10/22/2008 Sister - alive breast cancer  Sister - died COPD Mother- died CVA Father- died prostate cancer Brother- died prostate cancer Brother -died MI  Social History: Last updated: 10/20/2007 Widow Part-time - [employer] Patient  states former smoker.   Risk Factors: Smoking Status: quit (10/20/2007)  Review of Systems      See HPI       The patient complains of dyspnea on exertion and peripheral edema.  The patient denies anorexia, fever, weight loss, weight gain, vision loss, decreased hearing, hoarseness, chest pain, syncope, prolonged cough, headaches, hemoptysis,  abdominal pain, and severe indigestion/heartburn.    Vital Signs:  Patient profile:   77 year old female Height:      65 inches Weight:      146.50 pounds BMI:     24.47 O2 Sat:      92 % on Room air Pulse rate:   83 / minute BP sitting:   140 / 74  (left arm) Cuff size:   regular  Vitals Entered By: Reynaldo Minium CMA (May 13, 2009 4:06 PM)  O2 Flow:  Room air  Physical Exam  Additional Exam:  General: A/Ox3; pleasant and cooperative, NAD, SKIN: no rash, lesions NODES: no lymphadenopathy HEENT: [place]/AT, EOM- WNL, Conjuctivae- clear, PERRLA, TM-WNL, Nose- clear, Throat- clear and wnl NECK: Supple w/ fair ROM, JVD- none, normal carotid impulses w/o bruits Thyroid-  CHEST: fine dry crackles. Note resting sat here is 92% on room air. HEART: RRR, no m/g/r heard ABDOMEN: Soft and nl;  WUJ:WJXB, nl pulses, trace edema  NEURO: Grossly intact to observation      Impression & Recommendations:  Problem # 1:  COPD (ICD-496) Dyspnea reflects COPD and probably some cardiac limitation, but she seems near her baseline. She does her own housework and has gone back to bowling. She will let us know when meds need refilling. I'm not sure she needs oxygen any longer.  Problem # 2:  ALLERGIC RHINITIS (ICD-477.9) Anticipate more issues with season change and will respond then as needed.  Medications Added to Medication List This Visit: 1)  Pepcid 20 Mg Tabs (Famotidine) .... Take 2 by mouth two times a day 2)  Norvasc 10 Mg Tabs (Amlodipine besylate) .... Take 1 by mouth once daily 3)  Oxygen 2 L [facility]   Other Orders: Est. Patient Level II (14782)  Patient Instructions: 1)  Please schedule a follow-up appointment in 4 months. 2)  Call as needed. 3)  Ok to see how you do not using the oxygen.- [facility]

## 2010-05-12 NOTE — Progress Notes (Signed)
Summary: refills  Phone Note Call from Patient Call back at Home Phone [phone]   Caller: Patient Call For: young Reason for Call: Refill Medication Summary of Call: Needs refills for: xopenex hfa , spiriva , symbicort 160-4.84mcg, symbicort8-4.101mcg [facility]ed to [facility] @ [phone], Initial call taken by: Darletta Moll,  October 02, 2009 10:28 AM  Follow-up for Phone Call        PLS ADVISE  WHICH DOSE OF SYMBICORT IS CORRECT BECAUSE SHE IS ASKING FOR A REFILL ON BOTH STRENGTHS  Philipp Deputy [facility]  October 02, 2009 12:36 PM   Additional Follow-up for Phone Call Additional follow up Details #1::        Explain to her that insurance unlikely to allow refill of both. Suggest she stick with the stronger 160/4.5 until I see her next. Additional Follow-up by: Waymon Budge MD,  October 02, 2009 1:55 PM    Additional Follow-up for Phone Call Additional follow up Details #2::    Spoke with pt informed of Dr [name] recommendations and rx faxed to [facility].Kandice Hams [facility]  October 02, 2009 2:19 PM  Follow-up by: Kandice Hams CMA,  October 02, 2009 2:19 PM  Prescriptions: SPIRIVA HANDIHALER 18 MCG  CAPS (TIOTROPIUM BROMIDE MONOHYDRATE) Inhale one capsule daily.  #90 x 3   Entered by:   Kandice Hams CMA   Authorized by:   Waymon Budge MD   Signed by:   Kandice Hams CMA on 10/02/2009   Method used:   Faxed to ...       [facility] (mail-order)       [address]       [place], [place]  [address]       Ph: [phone]       Fax: [phone]   RxID:   [id number] SYMBICORT 160-4.5 MCG/ACT  AERO (BUDESONIDE-FORMOTEROL FUMARATE) 2 puffs two times a day  #3 x 3   Entered by:   Kandice Hams CMA   Authorized by:   Waymon Budge MD   Signed by:   Kandice Hams CMA on 10/02/2009   Method used:   Faxed to ...       [facility] (mail-order)       [address]       [place], [place]  [address]       Ph: [phone]  Fax: [phone]   RxID:   [id number] HCWCBJS HFA 45 MCG/ACT  AERO (LEVALBUTEROL TARTRATE) as needed  #3 x 3   Entered by:   Kandice Hams CMA   Authorized by:   Waymon Budge MD   Signed by:   Kandice Hams CMA on 10/02/2009   Method used:   Faxed to ...       [facility]* (mail-order)       [address]       [place], [place]  [address]       Ph: [phone]       Fax: [phone]   RxID:   [id number]

## 2010-05-12 NOTE — Progress Notes (Signed)
Summary: d/c o2  Phone Note Call from Patient   Caller: Patient Call For: young Summary of Call: pt need order to have oxygen picked up Initial call taken by: Rickard Patience,  May 27, 2009 8:27 AM  Follow-up for Phone Call        Cy sent order yesterday 05-26-2009 to d/c oxygen.  Will forward message to PCCs. Arman Filter LPN  May 27, 2009 8:43 AM   Additional Follow-up for Phone Call Additional follow up Details #1::        order was given to [facility] to dc 02  Additional Follow-up by: Oneita Jolly,  May 27, 2009 10:16 AM

## 2010-05-12 NOTE — Medication Information (Signed)
Summary: [facility]   Imported By: Lester Maywood 05/21/2009 07:57:01  _____________________________________________________________________  External Attachment:    Type:   Image     Comment:   External Document

## 2010-05-12 NOTE — Medication Information (Signed)
Summary: [facility]   Imported By: Valinda Hoar 10/21/2009 09:43:10  _____________________________________________________________________  External Attachment:    Type:   Image     Comment:   External Document

## 2010-05-12 NOTE — Progress Notes (Signed)
Summary: Ready to D/C home oxygen  Phone Note Call from Patient   Caller: [email or web address]  Call For: Ayjah Show Summary of Call: pt has called [facility] and wants her 02 picked up will need order from [facility]  Initial call taken by: Oneita Jolly,  May 26, 2009 12:16 PM  Follow-up for Phone Call        please advise if Natasha Bence to place order to have oxygen picked up. Carron Curie CMA  May 26, 2009 12:17 PM

## 2010-05-12 NOTE — Medication Information (Signed)
Summary: [facility] for Albuterol/[facility]  [facility] for Albuterol/[facility]   Imported By: Sherian Rein 10/20/2009 09:21:22  _____________________________________________________________________  External Attachment:    Type:   Image     Comment:   External Document

## 2010-06-24 LAB — COMPREHENSIVE METABOLIC PANEL
ALT: 39 U/L — ABNORMAL HIGH (ref 0–35)
AST: 40 U/L — ABNORMAL HIGH (ref 0–37)
Albumin: 3.4 g/dL — ABNORMAL LOW (ref 3.5–5.2)
Alkaline Phosphatase: 212 U/L — ABNORMAL HIGH (ref 39–117)
Chloride: 104 mEq/L (ref 96–112)
Creatinine, Ser: 0.95 mg/dL (ref 0.4–1.2)
GFR calc Af Amer: >60 mL/min (ref >60)
Potassium: 3.9 mEq/L (ref 3.5–5.1)
Sodium: 130 mEq/L — ABNORMAL LOW (ref 135–145)
Total Bilirubin: 2.2 mg/dL — ABNORMAL HIGH (ref 0.3–1.2)

## 2010-06-24 LAB — DIFFERENTIAL
Basophils Absolute: 0 10*3/uL (ref 0.0–0.1)
Eosinophils Absolute: 0 10*3/uL (ref 0.0–0.7)
Eosinophils Relative: 1 % (ref 0–5)
Lymphocytes Relative: 4 % — ABNORMAL LOW (ref 12–46)
Monocytes Absolute: 0.3 10*3/uL (ref 0.1–1.0)

## 2010-06-24 LAB — URINALYSIS, ROUTINE W REFLEX MICROSCOPIC
Bilirubin Urine: NEGATIVE
Ketones, ur: NEGATIVE mg/dL
Nitrite: NEGATIVE
Protein, ur: NEGATIVE mg/dL
pH: 5.5 (ref 5.0–8.0)

## 2010-06-24 LAB — CBC
Hemoglobin: 11.8 g/dL — ABNORMAL LOW (ref 12.0–15.0)
Platelets: 111 10*3/uL — ABNORMAL LOW (ref 150–400)
RBC: 3.89 MIL/uL (ref 3.87–5.11)
WBC: 6.7 10*3/uL (ref 4.0–10.5)

## 2010-06-24 LAB — URINE MICROSCOPIC-ADD ON

## 2010-06-28 LAB — BASIC METABOLIC PANEL
CO2: 22 mEq/L (ref 19–32)
CO2: 26 mEq/L (ref 19–32)
Calcium: 8.9 mg/dL (ref 8.4–10.5)
Calcium: 8.9 mg/dL (ref 8.4–10.5)
Chloride: 107 mEq/L (ref 96–112)
Creatinine, Ser: 0.92 mg/dL (ref 0.4–1.2)
Creatinine, Ser: 1.07 mg/dL (ref 0.4–1.2)
GFR calc Af Amer: 58 mL/min — ABNORMAL LOW (ref >60)
GFR calc Af Amer: >60 mL/min (ref >60)
GFR calc non Af Amer: 48 mL/min — ABNORMAL LOW (ref >60)
Glucose, Bld: 92 mg/dL (ref 70–99)
Sodium: 135 mEq/L (ref 135–145)
Sodium: 136 mEq/L (ref 135–145)

## 2010-06-28 LAB — POCT CARDIAC MARKERS: Troponin i, poc: <0.05 ng/mL (ref 0.00–0.09)

## 2010-06-28 LAB — COMPREHENSIVE METABOLIC PANEL
Albumin: 3 g/dL — ABNORMAL LOW (ref 3.5–5.2)
BUN: 14 mg/dL (ref 6–23)
Calcium: 9 mg/dL (ref 8.4–10.5)
Chloride: 108 mEq/L (ref 96–112)
Creatinine, Ser: 0.85 mg/dL (ref 0.4–1.2)
Total Bilirubin: 1.3 mg/dL — ABNORMAL HIGH (ref 0.3–1.2)

## 2010-06-28 LAB — CBC
HCT: 32.3 % — ABNORMAL LOW (ref 36.0–46.0)
Hemoglobin: 10.2 g/dL — ABNORMAL LOW (ref 12.0–15.0)
Hemoglobin: 12 g/dL (ref 12.0–15.0)
MCHC: 34.9 g/dL (ref 30.0–36.0)
MCHC: 35.3 g/dL (ref 30.0–36.0)
MCV: 89.7 fL (ref 78.0–100.0)
MCV: 89.8 fL (ref 78.0–100.0)
Platelets: 129 10*3/uL — ABNORMAL LOW (ref 150–400)
RBC: 3.25 MIL/uL — ABNORMAL LOW (ref 3.87–5.11)
RBC: 3.83 MIL/uL — ABNORMAL LOW (ref 3.87–5.11)
RDW: 14.2 % (ref 11.5–15.5)

## 2010-06-28 LAB — TSH: TSH: 1.796 u[IU]/mL (ref 0.350–4.500)

## 2010-06-28 LAB — LIPID PANEL
HDL: 54 mg/dL (ref >39)
HDL: 57 mg/dL (ref >39)
LDL Cholesterol: 48 mg/dL (ref 0–99)
Total CHOL/HDL Ratio: 2.3 RATIO
Triglycerides: 92 mg/dL (ref <150)
Triglycerides: 97 mg/dL (ref <150)
VLDL: 18 mg/dL (ref 0–40)
VLDL: 19 mg/dL (ref 0–40)

## 2010-06-28 LAB — DIFFERENTIAL
Basophils Relative: 0 % (ref 0–1)
Eosinophils Absolute: 0 10*3/uL (ref 0.0–0.7)
Eosinophils Relative: 1 % (ref 0–5)
Lymphs Abs: 0.7 10*3/uL (ref 0.7–4.0)
Monocytes Relative: 8 % (ref 3–12)
Neutrophils Relative %: 80 % — ABNORMAL HIGH (ref 43–77)

## 2010-06-28 LAB — CARDIAC PANEL(CRET KIN+CKTOT+MB+TROPI)
Relative Index: INVALID (ref 0.0–2.5)
Troponin I: 0.01 ng/mL (ref 0.00–0.06)
Troponin I: 0.01 ng/mL (ref 0.00–0.06)

## 2010-06-28 LAB — CK TOTAL AND CKMB (NOT AT ARMC)
CK, MB: 1.5 ng/mL (ref 0.3–4.0)
Total CK: 79 U/L (ref 7–177)

## 2010-06-28 LAB — BRAIN NATRIURETIC PEPTIDE: Pro B Natriuretic peptide (BNP): <30 pg/mL (ref 0.0–100.0)

## 2010-06-28 LAB — HEMOGLOBIN A1C: Hgb A1c MFr Bld: 6.4 % — ABNORMAL HIGH (ref 4.6–6.1)

## 2010-07-20 LAB — POCT CARDIAC MARKERS: Myoglobin, poc: 73.2 ng/mL (ref 12–200)

## 2010-07-20 LAB — CBC
MCHC: 33 g/dL (ref 30.0–36.0)
MCV: 90.8 fL (ref 78.0–100.0)
Platelets: 105 10*3/uL — ABNORMAL LOW (ref 150–400)

## 2010-07-20 LAB — DIFFERENTIAL
Basophils Relative: 0 % (ref 0–1)
Eosinophils Absolute: 0 10*3/uL (ref 0.0–0.7)
Neutrophils Relative %: 76 % (ref 43–77)

## 2010-08-25 NOTE — Assessment & Plan Note (Signed)
[facility]                             PULMONARY OFFICE NOTE   JREAM, BROYLES ANN                        MRN:          [id number]  DATE:09/07/2006                            DOB:          [birth year]    PROBLEMS:  1. Asthma/chronic obstructive pulmonary disease.  2. Allergic rhinitis.  3. Crohn's disease.  4. Hypertension.  5. Coronary disease/stent.  6. Esophageal reflux.   HISTORY:  She says that seasonal allergies have been bad for her this  spring with face itching, sneezing, post-nasal drip causing cough, but  no chest tightness or wheeze. Singulair no evident help. Benadryl helps  some and she says that it works better for her than either Zyrtec or  Claritin. Advair stimulated her heart too much which is why she needs to  stay with Xopenex HFA rescue inhaler. Bad heartburn especially when  supine, although she does have the head of her bed elevated. It makes  her cough and she is clear this is happening.   MEDICATIONS:  She had stopped allergy vaccine around 2006. She  continues:  1. Spiriva.  2. Zocor 20 mg.  3. Potassium.  4. Vitamins.  5. Prevacid.  6. Vitamin B-12 injection.  7. Norvasc 5 mg.  8. Singulair.  9. Red yeast rice.  10.Asmanex SR 1 b.i.d.  11.Home nebulizer with albuterol used occasionally p.r.n.  12.Xopenex HFA rescue inhaler.   Drug intolerant of CODEINE which makes her chest tight, and DIOVAN which  she thought made her chest hurt.   OBJECTIVE:  Weight 150 pounds, blood pressure 148/78, pulse 75, room air  saturation 95%. Heart sounds are regular with occasional extra beat, no  murmur or rub. No neck vein distension or edema. Chest is very clear,  although sounds seem a bit diminished bilaterally. Her throat is very  red. She is not hoarse. There is no strider. There is minor stuffing  quality to her speech, but the nasal membranes look normal and  unobstructed.   IMPRESSION:  Her complaints today focused  most around seasonal allergic  rhinitis and symptomatic GERD despite elevating the head of her bed and  taking Prevacid.   PLAN:  1. I emphasized reflux precautions and asked her to follow up with Dr.      Sherin Quarry on this issue.  2. I suggested that she add a second dose of acid blocker every day,      perhaps an over-the-counter if her insurance does not want to pay      for a b.i.d. of her current medication.  3. Try sample Symbicort 80 mg while continuing her Asmanex for now.      Medication talks were done.  4. She will use her Benadryl. She has some leftover nasal steroid      spray and is going to go back to using that regularly until she      uses up what she has, paying attention to what effect it may      develop on a cumulative basis.  5. Schedule return 6 weeks, earlier p.r.n.  [name], MD, Tonny Bollman, FACP  Electronically Signed    CDY/MedQ  DD: 09/11/2006  DT: 09/12/2006  Job #: [id number]   cc:   Tasia Catchings, M.D.

## 2010-08-25 NOTE — Assessment & Plan Note (Signed)
[facility]                             PULMONARY OFFICE NOTE   Brandi Cline, Brandi Cline                        MRN:          [id number]  DATE:10/19/2006                            DOB:          Oct 13, 1930    PROBLEM LIST:  1. Asthma/chronic obstructive pulmonary disease.  2. Allergic rhinitis.  3. Crohn's disease.  4. Hypertension.  5. Coronary disease and stent.  6. Esophageal reflux.   HISTORY:  Symbicort much better than Asthmanex alone at controlling  respiratory symptoms and she has been doing pretty well.  Occasional  breakthrough heartburn.  When Prevacid is not enough, she will  supplement with Zantac and tends to prefer using that b.i.d. instead of  Prevacid.   MEDICATIONS:  1. Spiriva.  2. Zocor.  3. Potassium.  4. Vitamins.  5. Zantac.  6. B12 injection.  7. Norvasc 5 mg.  8. Singulair.  9. Asthmanex 1 puff b.i.d.  10.Symbicort 80/4.5.  11.Nebulizer with albuterol.  12.Xopenex HFA rescue inhaler.   DRUG INTOLERANT:  1. CODEINE MAKES CHEST TIGHT.  2. DIOVAN CAUSED CHEST PAIN.   OBJECTIVE:  VITAL SIGNS:  Weight 150 pounds, BP 150/80, pulse 75, room  air saturation 975.  CHEST:  Breath sounds are clear, a little diminished but unlabored.  HEART:  Sounds are regular.  I do not hear a murmur.  HEENT:  Her throat looks somewhat red with drainage, adenopathy, or  stridor.  Nasal airway is not obstructed.   IMPRESSION:  Suspect her reflux is keeping her throat red and we  discussed this again.   She is encouraged to see Dr. Sherin Quarry.  Otherwise her rhinitis and  asthma/chronic obstructive pulmonary disease have been pretty stable.   PLAN:  1. Reflux precautions.  2. Gave permission to use Tums occasionally if she had any acute sense      of heartburn.  3. We will stop Asthmanex.  4. Change her Symbicort to 160/4.5 two puffs b.i.d.  5. Schedule return 6 months, earlier p.r.n.     Brandi D. [name], MD, Brandi Cline, FACP  Electronically Signed    CDY/MedQ  DD: 11/05/2006  DT: 11/06/2006  Job #: [id number]   cc:   Tasia Catchings, M.D.

## 2010-08-25 NOTE — Op Note (Signed)
NAME:  Brandi Cline, Brandi Cline                   ACCOUNT NO.:  [id number]   MEDICAL RECORD NO.:  [id number]          PATIENT TYPE:  AMB   LOCATION:  [facility]                         FACILITY:  [facility]   PHYSICIAN:  Mark C. Vernie Ammons, M.D.  DATE OF BIRTH:  04/16/1930   DATE OF PROCEDURE:  05/02/2007  DATE OF DISCHARGE:                               OPERATIVE REPORT   PREOP DIAGNOSIS:  Left ureteral calculi.   POSTOP DIAGNOSES:  1. One left ureteral calculi.  2. Structural abnormality of left ureter.   PROCEDURES:  Cystoscopy, left retrograde pyelogram with interpretation,  left ureteroscopy, stone extraction and double-J stent placement (no  string).   SURGEON:  Ihor Gully, M.D.   ANESTHESIA:  General.   SPECIMENS:  None.   BLOOD LOSS:  Minimal.   DRAINS:  A 5-French 24 cm Polaris stent (no string).   COMPLICATIONS:  None.   INDICATIONS:  The patient is a 77 year old white female who is status  post lithotripsy this past Thursday.  She had excellent fragmentation of  the stone and I thought it would easily pass, however, she had to go to  the emergency room over the weekend and presented to my office today in  severe pain requiring continued parenteral narcotics for pain control.  She, therefore, is brought to the operating room for ureteroscopic  extraction of the stone fragments.  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  The risks, complications and alternatives were  discussed.  She understands and elected to proceed.   DESCRIPTION OF OPERATION:  After informed consent, the patient was  brought to the major OR, placed on the table and administered general  anesthesia and then moved to the dorsal lithotomy position.  Her  genitalia was sterilely prepped with Betadine and draped and an official  time-out was then performed.   Cystoscopy was performed using the 22-French cystoscope and 12 degree  lens.  I note  the bladder upon full inspection is noted to be of free of  any tumors or inflammatory lesions.  Ureteral orifices were of normal  configuration and position.  There were multiple sand grain size stone  fragments on the floor of the bladder.  The left orifice was then  identified and cannulated with a 6-French open-end ureteral catheter.   A left retrograde pyelogram was then performed by injecting full  strength contrast through the left open-ended ureteral catheter which  was placed just in the opening of the left ureteral orifice.  This  revealed a normal ureter without abnormality up to the level of the  exact midportion of the L4 vertebral body.  At that location the ureter  takes a U-turn deviating medially and then back onto its normal course  where filling defects consistent with the Steinstrasse seen on KUB are  noted.  Proximal to that there is dilation of the ureter.  The  collecting system appeared normal though with no filling defects in the  kidney.   A 0.038 inch  floppy tip guidewire was then passed through the open-ended  ureteral stent and up the ureter in an attempt to negotiate this through  the U turn of the ureter.  I thought the guidewire was passing through,  but there seemed to be some difficulty so I did not force it and removed  the guidewire and replaced that with a Glidewire with a curve on the tip  hoping that this could follow the curve of the ureter.  I was still  unable to get this to pass up the ureter easily.  I, therefore, removed  the Glidewire and passed the 6-French rigid short ureteroscope under  direct visualization into the left orifice and up the left ureter  without difficulty until I encountered the area of the U-turn.  I was  able to manipulate the scope by gently directing it medially and then  again laterally and under direct visualization with gentle pressure I  was easily able to follow the course of the ureter and make it through  the  area of the U-turn and into the more proximal ureter where a few  small stone fragments were identified.  There were much fewer than were  noted on the KUB in my office and there appeared to be less when I  performed initial fluoroscopy at the beginning of the procedure.  None  was large enough to be causing obstruction such as a lead fragment.  I  passed the ureteroscope on up the ureter further and then passed the  guidewire through the ureteroscope into the area of the renal pelvis  where it curled appropriately in the renal pelvis.  I then backed  ureteroscope down out of the ureter, along the guidewire and noted a few  small mm to submillimeter stone fragments.  The ureter did appear normal  through this region with no filling defect or other abnormality seen.  The ureteroscope was then completely extracted.   The cystoscope was back loaded over the guidewire and the Polaris stent  passed over the guidewire.  As I removed the guidewire good curl was  noted in the renal pelvis.  There was the loops of the Polaris stent  noted exiting the ureteral orifice in the bladder.  I reinspected the  bladder and noted no further stone fragments present.  As I did back the  scope out I was able to extract some stones as they followed the scope  down and out of the ureter.  These were flushed from the bladder and no  further stones were identified on the floor of the bladder  cystoscopically.  I, therefore, drained the bladder and removed the  cystoscope and the patient was awakened and taken to the recovery room  in satisfactory condition.  She tolerated the procedure well.  There  were no intraoperative complications.  She was given a B&O suppository  at the end of the operation and also will be given a prescription for  Pyridium Plus #24.  I will have her remain on Flomax 0.4 mg a day.  I  gave her samples of those and I will plan to see her back in the office  in 1 week.  Written instructions  were given at the time of discharge.      Mark C. Vernie Ammons, M.D.  Electronically Signed     MCO/MEDQ  D:  05/02/2007  T:  05/03/2007  Job:  [id number]

## 2010-08-25 NOTE — H&P (Signed)
[name], [name] NO.:  [id number]   MEDICAL RECORD NO.:  [id number]          PATIENT TYPE:  INP   LOCATION:  3703                         FACILITY:  [facility]   PHYSICIAN:  Lyn Records, M.D.   DATE OF BIRTH:  May 25, 1930   DATE OF ADMISSION:  09/20/2007  DATE OF DISCHARGE:                              HISTORY & PHYSICAL   PRIMARY CARE PHYSICIAN:  Lyn Records, MD   CHIEF COMPLAINT:  Chest pain.   HISTORY OF PRESENT ILLNESS:  Brandi Cline is a pleasant 77 year old  female patient of Dr. Verdis Cline with known CAD status post stent  placement to the LAD and circumflex in August 2002.  She has had in the  last 2 weeks chest pain consistent with her usual angina.  It has been  increasing and is present at rest.  Nitroglycerin will typically relieve  it.  There was some associated shortness of breath but no sweats or  nausea.  Discomfort does not radiate.  On the morning of admission, she  was seeing at a computer and began experiencing that.  Describes as a  lower mid sternal pain.  It was 8 on a scale of 1/10 in severity.  She  took 1 nitroglycerin and by the time she reported to the office had  taken another one and was pain free.  She is now being admitted for  unstable angina and cardiac catheterization in the morning.   REVIEW OF SYSTEMS:  GENERAL:  No recent weight gain or loss.  No fever  or chills.  Energy level is fair.  HEENT:  No problems with vision, no  diplopia, no earache, tinnitus, nasal obstruction, epistaxis or sore  throat.  NECK:  No stiffness.  Occasionally, pain will radiate up into  her jaw.  CHEST:  Denies PND or orthopnea.  No cough or wheezing.  CARDIOVASCULAR:  As above.  No palpitations or tachyarrhythmias.  Also  denies any pleuritic discomfort.  ABDOMEN:  No abdominal pain.  History  of Crohn's, stable.  No nausea, vomiting, diarrhea, constipation,  hematochezia, or melena.  GU:  Denies dysuria, urinary frequency, or  hematuria.   EXTREMITIES:  No usual joint pain, no edema.  NEURO:  No  numbness, tingling, headache, seizures, memory loss, lightheadedness,  presyncope or syncope.   PAST MEDICAL HISTORY:  1. CAD, prior stent implantations to the LAD, circumflex August 2002.      a.     Repeat cath June 2005, widely patent stents, no significant       obstruction.  EF 60%.  2. Hypercholesterolemia.  3. Hypertension.  4. COPD.  5. GERD.  6. Anxiety.  7. Crohn disease.   PAST SURGICAL HISTORY:  1. Tonsillectomy.  2. Cholecystectomy.  3. TAH.  4. Breast biopsy on the left.  5. Terminal ileal bypass followed by resection and bypass with right      hemicolectomy.  6. Nasal septal repair.  7. Multiple urethral dilatations.  8. Basal cell carcinoma removed from back.  9. Stent implantation as above.   CURRENT MEDICATIONS:  1. Aspirin 81 mg a day.  2. Zocor 20 mg a day.  3. Potassium 20 mEq twice a day.  4. Vitamin B12 shots monthly.  5. Magnesium.  6. Spiriva 1 puff daily.  7. Prevacid 30 mg one a day.  8. Norvasc 5 mg a day.  9. Symbicort 2 puffs b.i.d.  10.HCT 25 mg 1 tablet daily.  11.Nitroglycerin 0.4 mg sublingual p.r.n.   DRUG ALLERGIES:  CODEINE.   SOCIAL HISTORY:  [place] native.  Part-time office work.  Widowed,  husband died of a stroke in 2000-09-17.  Quit smoking in 09-18-1991 after 20-pack  years.  No alcohol.   FAMILY HISTORY:  Father died at age 97 from prostate cancer.  Mother  died at age 39 with multiple infarct, dementia, diabetes, hypertension,  and uremia.  Two brothers, one died of an aneurysm, the other of  cirrhosis of the liver with peptic ulcer disease and prostate cancer.  He had also had a AAA repair.  Three sisters, one died of COPD, one  alive with chronic bronchitis, and third sister alive with breast  cancer.   OBJECTIVE:  VITAL SIGNS:  Weight is 143.4, pulse is 72, blood pressure  172/60.  GENERAL:  Very pleasant, no acute distress.  SKIN:  Warm and dry.  HEENT:  Ears,  nose, and throat unremarkable.  NECK:  No JVD.  No carotid bruits auscultated.  No adenopathy.  Thyroid  gland is not enlarged.  CHEST:  Good excursion, clear throughout without rales, wheezing, or  rhonchi.  HEART:  Normal S1 and S2 with a grade 1/6 systolic murmur, unchanged.  No rub or click.  ABDOMEN:  Soft, nondistended.  Bowel sounds are present.  No abdominal  bruits are auscultated.  No hepatosplenomegaly or masses.  GU/RECTAL:  Deferred.  EXTREMITIES:  MAE x4.  No clubbing, cyanosis or edema.  +2 distal  pulses.  NEURO:  Nonfocal.   EKG reveals normal sinus rhythm with no ST segment changes.   IMPRESSION:  1. Unstable angina.  2. Known coronary artery disease status post intervention as above.  3. Hypertension.  4. Hyperlipidemia, excellent control.  5. Crohn disease, stable.  6. Chronic bronchitis.   PLAN:  Admit to telemetry bed.  Obtain cardiac markers.  IV  nitroglycerin for chest pain and Lovenox therapy.  N.p.o. after midnight  for cardiac catheterization in the morning.  Dr. Katrinka Blazing to follow.      Tamera C. Lewis, N.P.      Lyn Records, M.D.  Electronically Signed    TCL/MEDQ  D:  09/20/2007  T:  09/21/2007  Job:  [id number]

## 2010-08-28 NOTE — Cardiovascular Report (Signed)
[place]. [facility]  Patient:    Brandi Cline, Brandi Cline Visit Number: [id number] MRN: [id number]          Service Type: CAT Location: 6500 [id number] Attending Physician:  Mora Appl Dictated by:   Darci Needle, M.D. Proc. Date: 12/06/00 Adm. Date:  12/06/2000   CC:         Genene Churn. Sherin Quarry, M.D.  Dr. Tresa Endo, [facility]  Meade Maw, M.D.   Cardiac Catheterization  INDICATION:  A two-month history of angina, abnormal exercise treadmill test. This procedure is being done after diagnostic cardiac catheterization found high-grade circumflex and significant LAD lesions.  PROCEDURES: 1. Direct stent left anterior descending artery. 2. Direct stent circumflex.  CARDIOLOGIST:  Darci Needle, M.D.  DESCRIPTION OF PROCEDURE:  After informed consent, the 6-French sheath previously inserted by Dr. Fraser Din for diagnostic catheterization was exchanged using double glove technique.  We then placed a 7-French sheath. The 7-French #4 left Judkins catheter was used to obtain guiding shots.  A BMW wire was used; 4000 units of IV heparin was administered; a double bolus followed by an infusion of Integrilin was then used.  The patient then had a 3.0 mm diameter x 15 mm long NIR stent deployed to 11 atmospheres.  A pristine result was obtained.  A 3.0 x 9 mm NIR was then deployed in the circumflex over the guidewire.  A beautiful result was achieved.  TIMI flow was 3 in both vessels.  ACT postprocedure 244 seconds.  CONCLUSIONS: 1. Successful stent to left anterior descending artery from 70% to 0%. 2. Successful stent circumflex from 95% to 0%.  PLAN:  Aspirin, Plavix, Integrilin.  Further care per Dr. Hillary Bow. Dictated by:   Darci Needle, M.D. Attending Physician:  Mora Appl DD:  12/06/00 TD:  12/07/00 Job: [id number]

## 2010-08-28 NOTE — Assessment & Plan Note (Signed)
[facility]                             PULMONARY OFFICE NOTE   Brandi Cline, Brandi Cline                        MRN:          [id number]  DATE:03/11/2006                            DOB:          1930-06-17    PROBLEMS:  1. Asthma/chronic obstructive pulmonary disease.  2. Allergic rhinitis.  3. Crohn's disease.  4. Hypertension.  5. Coronary disease/stent.   HISTORY:  She is quite comfortable now using a Xopenex HFA rescue  inhaler and Asmanex 1 puff b.i.d. as her maintenance steroid, and says  with these she is not getting the foot swelling that she attributed to  Advair.  She thinks she has a sinus infection because of frontal  headache, some dizziness and mildly bloody discharge.  We discussed  infection versus dryness from indoor heat.  Has had flu shot.   MEDICATIONS:  1. Spiriva.  2. Zocor 20 mg.  3. Potassium.  4. Prevacid.  5. B12 injection.  6. Steroid eye drops.  7. Norvasc 5 mg.  8. Singulair.  9. Red yeast rice.  10.Asmanex 1 puff b.i.d.  11.Home nebulizer used occasionally with albuterol.  12.Xopenex HFA.   ALLERGIES:  DRUG INTOLERANT OF CODEINE which caused chest tightness, and  DIOVAN, thought to cause chest pain.   OBJECTIVE:  Weight 150 pounds, BP 146/74, pulse regular 70, room air  saturation 97%.  Narrow bilateral nasal airway.  I do not see postnasal drainage or  obvious purulent discharge.  There is no adenopathy.  LUNGS:  Fields are quiet today with unlabored breathing and clear.  HEART:  Sounds regular, no murmur or gallop.   IMPRESSION:  1. Stable asthma with chronic obstructive pulmonary disease, well      controlled with maintenance inhaled steroid.  2. Rhinitis, possible early sinusitis, although I favor more of this      being a nasal drying congestion.   PLAN:  1. We discussed Sudafed for p.r.n. use, and gave a prescription      generic Ceftin 500 mg b.i.d. for 7 days to hold, with discussion.      We  have converted her Xopenex and Asmanex to the 61-month      prescriptions.  2. Schedule return in 6 months, earlier p.r.n.  3. She will follow with Dr. Sherin Quarry for primary care.     [name], MD, Tonny Bollman, FACP  Electronically Signed    CDY/MedQ  DD: 03/11/2006  DT: 03/12/2006  Job #: [id number]   cc:   Tasia Catchings, M.D.

## 2010-08-28 NOTE — Discharge Summary (Signed)
[place]. [facility]  Patient:    Brandi Cline, Brandi Cline Visit Number: [id number] MRN: [id number]          Service Type: MED Location: [phone] Attending Physician:  Meade Maw A Dictated by:   Anselm Lis, N.P. Admit Date:  12/17/2000 Discharge Date: 12/19/2000   CC:         Genene Churn. Sherin Quarry, M.D., primary care Keyera Hattabaugh                           Discharge Summary  DATE OF BIRTH:  1930/06/17.  PROCEDURES:  December 19, 2000, by Meade Maw, M.D.:  Previously placed stent as were patent.  No obstructive CAD apparent.  DISCHARGE DIAGNOSIS:  Chest discomfort, noncardiac in etiology.  Question related to gastroesophageal reflux disease.  PLAN: 1. The patient discharged to home in stable condition. 2. Discharge medications:    a. Plavix 75 mg p.o. q.d.    b. Aspirin once daily.    c. Zocor 20 mg per day.    d. B12.    e. Advair.    f. Albuterol.    g. Atrovent.    h. Potassium supplement.    i. (New) Aciphex 20 mg p.o. b.i.d. x 8 weeks. 3. Activity:  No heavy lifting or pushing or stair-climbing for 48 hours. 4. The patient to call our clinic for bleeding, pain, or swelling at cath    site. 5. Follow-up:  Meade Maw, M.D., in two weeks.  HISTORY OF PRESENT ILLNESS:  Brandi Cline is a pleasant 77 year old female with history of CAD, status post stent to left anterior descending and left circumflex December 06, 2000.  The patient presented to the emergency room after having intermittent chest discomfort over the last 12-24 hours.  Discomfort substernal radiating to her jaw with upper lip tightness similar to discomfort prior to placement of stents.  She originally had a night prior to admission causing her to wake up and lasting about 20 minutes with a repeat episode 11 a.m. the day of admission.  She had another subsequent episode with persistent heaviness and dyspnea.  She took sublingual nitrates with partial relief and was admitted to the  ER by Colleen Can. Deborah Chalk, M.D.  She was started on IV nitroglycerin and IV heparin.  The patient ruled out for MI by negative serial cardiac enzymes though her first troponin I was slightly elevated at 0.09.  On December 19, 2000, she was taken for coronary angiography by Dr. Hillary Bow who found area of previously placed stents and the LAD and circumflex were widely patent.  He had no evidence of obstructive CAD.  The patient was returned to her room in stable condition.  An H2 blocker was initiated.  She was discharged home later that evening.  PAST MEDICAL HISTORY:  1. History of coronary atherosclerotic heart disease.     a. December 06, 2000, PTCA/stent to the LAD and circumflex.  2. History of anxiety.  3. History of asthma.  4. Bronchitis.  5. COPD.  6. Crohns disease with previous resection of the bowel.  7. GERD.  8. Chronic urinary tract infection.  9. Dyslipidemia. 10. History of kidney stones. 11. History of vertigo. 12. History of tonsillectomy. 13. Cholecystectomy. 14. Total abdominal hysterectomy. 15. History of left breast biopsy. 16. Terminal ileal bypass.  LABORATORY TESTS AND DATA:  First CK of 87 with MB fraction 3.6 and troponin I 0.09, second  CK of 63 with MB fraction 3.2 and troponin I 0.02, third CK 57 with MB fraction 1.4.  Sodium 136, potassium 3.5, chloride 106, CO2 22, BUN 12, creatinine 0.7, glucose 107.  LFTs within normal range.  Admission coags within normal range.  Her wbc was 7.2, hemoglobin 11.7, hematocrit 32.5, platelets 308.  Chest x-ray revealed probable underlying COPD, no overt acute process seen.  Admission EKG revealed no ischemic changes. Dictated by:   Anselm Lis, N.P. Attending Physician:  Mora Appl DD:  01/25/01 TD:  01/26/01 Job: [id number]

## 2010-08-28 NOTE — Cardiovascular Report (Signed)
Brandi Cline, Brandi Cline                           ACCOUNT NO.:  [id number]   MEDICAL RECORD NO.:  [id number]                   PATIENT TYPE:  OIB   LOCATION:  6598                                 FACILITY:  [facility]   PHYSICIAN:  Lesleigh Noe, M.D.            DATE OF BIRTH:  May 10, 1930   DATE OF PROCEDURE:  10/10/2003  DATE OF DISCHARGE:                              CARDIAC CATHETERIZATION   INDICATIONS FOR PROCEDURE:  The patient is 48 and has a history of coronary  artery disease.  She underwent coronary artery stent implantation in the  left anterior descending and circumflex arteries in 2002.  Recent Cardiolite  study was negative with evidence of ischemia.  However, she has chest  discomfort and prior intervention on both the LAD and the circumflex.  She  had a normal Cardiolite in 2002.  This study is being done to rule out  significant coronary artery disease in this patient with known prior  Cardiolite but significant coronary disease.  She has abnormal  electrocardiographic response to exercise testing ischemia.   PROCEDURE PERFORMED:  1. Left heart catheterization.  2. Selective coronary angiogram.  3. Left ventriculography.   DESCRIPTION OF PROCEDURE:  After informed consent, a 4 French sheath was  placed in the right femoral artery using a modified Seldinger technique. A 4  French B2 multipurpose catheter was used for hemodynamic recordings, left  ventriculography by hand injection, and selective right coronary artery  angiography.  The left coronary angiography was performed with the 4 French  #4 left catheter.  The patient tolerated the procedure without  complications.   RESULTS:  I:  HEMODYNAMIC DATA:  a.  Aortic pressure 170/72.  b.  Left ventricular pressure 170/11.   II:  LEFT VENTRICULOGRAPHY:  The left ventricular cavity size and overall  systolic function are normal. The LVEF is 60%.  .   III:  SELECTIVE CORONARY ANGIOGRAPHY:  a.  Left main coronary  artery: Widely patent.  b.  Left anterior descending coronary: The LAD is a large vessel that wraps  around the left ventricular apex.  There is a widely patent stent in the  proximal LAD but perhaps 20-30% narrowing.  The diagonal arises from within  the stent and contains ostial 60% narrowing.  The diagonal is otherwise free  of any obstruction as is the remainder of the left anterior descending.  The  left anterior descending is transapical.  c.  Circumflex artery: The circumflex coronary artery is a large vessel and  gives origin to a large trifurcating first obtuse marginal.  The entire  circumflex system is normal.  d.  Right coronary: The right coronary is free of any significant  obstruction. There is eccentric 30% mid vessel narrowing.  Large PDA and  three left ventricular branches are noted.   CONCLUSIONS:  1. Widely patent left anterior descending and circumflex stents.  The LAD  contains up to 20-30% in-stent narrowing.  The ETL diagonal contains 50-     70% ostial narrowing and is unchanged from prior studies.  There is a 20-     30% narrowing in the mid right coronary artery.  No significant     obstructive lesions are present.  2. Normal left ventricular function.  3. False positive electrocardiographic response to exercise.   PLAN:  Continue risk factor modification and current medical therapy.  No  further cardiac evaluation or management changes are necessary.                                               Lesleigh Noe, M.D.    HWS/MEDQ  D:  10/10/2003  T:  10/10/2003  Job:  [id number]   cc:   Tasia Catchings, M.D.  [address]  [place]  [place] [address]  Fax: [phone]

## 2010-08-28 NOTE — Assessment & Plan Note (Signed)
[facility]                               PULMONARY OFFICE NOTE   MISHEEL, GOWANS ANN                        MRN:          [id number]  DATE:02/07/2006                            DOB:          Sep 07, 1930    PROBLEMS:  1. Asthmatic bronchitis.  2. Chronic obstructive pulmonary disease.  3. Allergic rhinitis.  4. Crohn's disease.  5. Hypertension.  6. Coronary artery disease/stent.   HISTORY:  She has felt somewhat more short of breath over the last few  months, nothing very specific but she is using her albuterol inhaler 3 or 4  times a day and noting more exertional dyspnea.  Little cough, which is  nonproductive.  No chest pain.  Breathing does not wake her at night.  She  has not had as much ankle edema as she used to.  There has been no fever or  bleeding and nothing very specific.  Pulmonary function tests in 2006 had  shown moderate COPD.  Spiriva does not hold her, seeming to wear off after a  few hours.  Advair seems to over stimulate.  She asked to try Asmanex  because she saw in a TV ad.  We discussed it.  She had stopped allergy  vaccine as planned last year.   MEDICATION:  1. Spiriva.  2. Zocor 20 mg.  3. Potassium.  4. Vitamins.  5. Prevacid.  6. B-12 injection.  7. Prednisone eyedrops.  8. Norvasc 5 mg.  9. Singulair.  10.Red yeast rice.  11.Nebulizer with albuterol use q.i.d. p.r.n. occasionally.  12.Hydromet cough syrup.  13.Albuterol rescue inhaler.  14.Drug intolerant of codeine which caused chest tightness.  15.Diovan which seemed to cause chest pain.   OBJECTIVE:  Weight 152 pounds, which is about stable from the spring.  BP  138/78.  Pulse regular 67.  Room air saturation 97%.  She looks comfortable sitting in the exam room.  Color is normal.  There is very faint, end-expiratory wheeze bilaterally without dullness or  increased work of breathing.  No neck vein distention or edema.  Heart sounds are regular without  murmur.   IMPRESSION:  Increased dyspnea.  May reflect exacerbation of chronic  obstructive pulmonary disease.  I am not sure if she did not just have  difficulty with the air quality from the summer.   PLAN:  1. Try Xopenex HFA metered inhaler sample as potentially less stimulating.  2. Try Asmanex 1 puff b.i.d. with instructions.  3. She will get her flu vaccine at Dr. Lucretia Kern, who also tracks her      Pneumovax.  4. Chest x-ray.  5. Schedule a return in 1 month, earlier p.r.n.     [name], MD, Tonny Bollman, FACP  Electronically Signed    CDY/MedQ  DD: 02/12/2006  DT: 02/13/2006  Job #: [id number]   cc:   Tasia Catchings, M.D.

## 2010-08-28 NOTE — H&P (Signed)
[place]. [facility]  Patient:    Brandi Cline, Brandi Cline Visit Number: [id number] MRN: [id number]          Service Type: MED Location: [phone] Attending Physician:  Hanley Seamen Dictated by:   Colleen Can. Deborah Chalk, M.D. Admit Date:  12/17/2000   CC:         Meade Maw, M.D.  Darci Needle, M.D.   History and Physical  CHIEF COMPLAINT: Ms. Sambrano is a 77 year old female, with known coronary artery disease.  She had stents to the left anterior descending and left circumflex on December 06, 2000.  She presented to the emergency room today after having intermittent chest discomfort over the last 12-24 hours.  HISTORY OF PRESENT ILLNESS: It is a substernal discomfort radiating to her jaw, with upper lip tightness, and was very similar to what her pain was before she had stents placed.  She originally had it last night and it caused her to wake up, lasting about 20 minutes, and then today at about 11 a.m. she had another episode with persistent heaviness and dyspnea.  She took nitroglycerin with partial relief.  She is admitted now for evaluation.  PAST MEDICAL HISTORY:  1. Prior abnormal exercise tolerance test with catheterization and with     stents to the LAD and left circumflex (NIR stents) on December 06, 2000.     The LAD stent was a 3.0 x 15 NIR stent and the left circumflex stent was a     3.0 x 9-mm NIR stent.  He felt he had excellent flow post procedure.  2. History of anxiety.  3. History of asthma.  4. Bronchitis.  5. COPD.  6. Crohns disease, with previous resection of the bowel.  7. Gastroesophageal reflux disease.  8. Chronic urinary tract infections.  9. Dyslipidemia. 10. History of kidney stones. 11. History of vertigo. 12. History of tonsillectomy. 13. Cholecystectomy. 14. Status post total abdominal hysterectomy. 15. History of left breast biopsy. 16. Terminal ileal bypass.  ALLERGIES:  1. CODEINE.  2. SULFA.  MEDICATIONS:  1. Plavix 75 mg.  2. Aspirin one q.d.  3. Zocor 20 mg q.d.  4. B-12.  5. Advair.  6. Albuterol.  7. Atrovent.  8. Potassium supplement.  FAMILY HISTORY: Father died at age 51 with prostate cancer.  Mother died at age 74 with dementia, diabetes, and hypertension.  SOCIAL HISTORY: Remote tobacco abuse, none since 1993; previously 20-pack-year history.  She has been widowed since January 2002.  She has two children.  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  REVIEW OF SYSTEMS: HEENT: Negative.  There has been some cough but this has been chronic.  Appetite has been reasonably satisfactory.  She has chronic diarrhea.  She has some generalized arthritis and sore feet.  GU: Negative. There has been slight increase in peripheral edema.  Otherwise, the Review Of Systems has been negative.  PHYSICAL EXAMINATION:  GENERAL: She is quiet, in no acute distress.  She is soft-spoken.  She is not depressed.  VITAL SIGNS: Blood pressure 159/88, heart rate 70s, respiratory rate 18.  SKIN: Warm and dry.  Color is normal.  NECK: Supple without bruits.  LUNGS: Basically clear.  Few crackles, especially on the right.  No wheezing.  HEART: Regular rate and rhythm.  No murmur.  ABDOMEN: Soft, normal bowel sounds.  No masses, no hepatosplenomegaly.  EXTREMITIES: Fleshy.  There is a thumb-size nodule in the right groin site  from a small resolving hematoma.  LABORATORY DATA: WBC 6000, hematocrit 35; platelets 308,000.  Potassium 3.5, BUN 12, creatinine 0.7, glucose 104.  CK negative.  Troponin 0.09.  EKG shows no acute changes.  OVERALL IMPRESSION:  1. Chest pain and angina.  2. Known coronary artery disease with recent stents to the left anterior     descending and circumflex.  3. Asthma.  4. Hypercholesterolemia.  PLAN: Will admit.  Certainly worry about subacute thrombosis.  Will start her on IV nitroglycerin and IV heparin.  She will need to have a  repeat catheterization. Dictated by:   Colleen Can Deborah Chalk, M.D. Attending Physician:  Hanley Seamen DD:  12/17/00 TD:  12/18/00 Job: [id number]

## 2010-08-28 NOTE — Discharge Summary (Signed)
NAMEKIMESHA, Brandi Cline NO.:  [id number]   MEDICAL RECORD NO.:  [id number]          PATIENT TYPE:  OBV   LOCATION:  3703                         FACILITY:  [facility]   PHYSICIAN:  Lyn Records, M.D.   DATE OF BIRTH:  10-17-1930   DATE OF ADMISSION:  09/20/2007  DATE OF DISCHARGE:  09/21/2007                               DISCHARGE SUMMARY   DISCHARGE DIAGNOSES:  1. Chest pain, resolved.  2. Nonobstructive coronary artery disease, medical therapy, normal LV      function.  3. Known coronary artery disease.  History of prior stent to the LAD      and circumflex in 2002.  4. Hypercholesterolemia.  5. Hypertension.  6. Chronic obstructive pulmonary disease.  7. Gastroesophageal reflux disease.  8. Anxiety.  9. Crohn's status post colon resection.   HOSPITAL COURSE:  Ms. Braddy is a 77 year old female with known coronary  artery disease as discussed above.  Over the 2 weeks prior to admission,  she complained of chest pain consistent with angina that she has had in  the past.  It mainly occurs at rest and nitroglycerin would typically  relieve it.   She was then admitted to [facility] for further evaluation.  Her  cardiac isoenzymes were negative for myocardial infarction.  Her BNP was  41, magnesium 2.0, sodium 139, potassium 3.4, BUN 12, creatinine 0.57,  hemoglobin of 13, and hematocrit of 38.2.   Because of her symptoms and known coronary artery disease, we elected to  perform a cardiac catheterization upon this patient.  The procedure was  performed by Dr. Verdis Prime and the patient was found to have  nonobstructive disease with normal LV function.  She was then discharged  to home in stable, but improved condition.   DISCHARGE MEDICATIONS:  1. Baby aspirin 81 mg a day.  2. Zocor 20 mg daily.  3. Potassium 20 mEq twice a day.  4. Prevacid 30 mg a day.  5. Norvasc 5 mg a day.  6. Symbicort two puffs twice a day.  7. Hydrochlorothiazide 25 mg a day.  8. Spiriva once daily.  9. Xanax 0.25 mg p.r.n.  10.Plavix 75 mg a day for 1 month.  11.Nitroglycerin p.r.n. chest pain.   The patient is to remain on a low-sodium, heart-healthy diet.  Activity  as tolerated.  Clean the cath site gently with soap and water.  No  scrubbing.  Follow up with Dr. Dellia Cloud, nurse practitioner on  October 05, 2007, at 1 p.m.      Guy Franco, P.A.      Lyn Records, M.D.  Electronically Signed    LB/MEDQ  D:  10/24/2007  T:  10/25/2007  Job:  [id number]   cc:   Lyn Records, M.D.

## 2010-08-28 NOTE — Cardiovascular Report (Signed)
[place]. [facility]  Patient:    Brandi Cline, Brandi Cline Visit Number: [id number] MRN: [id number]          Service Type: Attending:  Lemont Fillers. Fraser Din, M.D. Dictated by:   Lemont Fillers Fraser Din, M.D. Proc. Date: 12/19/00                          Cardiac Catheterization  PROCEDURE PERFORMED: Left heart catheterization, coronary angiography.  INDICATIONS FOR PROCEDURE: Recurrent chest pain following recently placed stents in the LAD and circumflex.  DESCRIPTION OF PROCEDURE: After obtaining written informed consent, the patient was brought to the cardiac catheterization lab in the postabsorptive state.  Preoperative sedation was achieved using IV Versed. The left groin was prepped and draped in the usual sterile fashion. It was elected to proceed with left femoral artery as there was still a small hematoma over the right femoral artery. Local anesthesia was achieved using 1% Xylocaine. A 6 French hemostasis sheath was placed into the left femoral artery using a modified Seldinger technique.  Selective coronary angiography was performed using a JL4, JR4 Judkins catheter. Left heart pressures were obtained using a 6 French pigtail curved catheter. All catheters exchanged were made over a guide wire. The hemostasis sheath was flushed following each engagement following the procedure.  The stents were patent.  The patient was transferred to holding area. The hemostasis sheath was removed. Hemostasis was achieved using digital pressure.  FINDINGS: There was no gradient on pullback.  CORONARY ANGIOGRAPHY: Left main coronary artery: The left main coronary artery bifurcated into the left anterior descending and circumflex vessels. There was no significant disease in the left main coronary artery.  Left anterior descending: The left anterior descending gave rise to a moderate bifurcating diagonal #1 and a smaller diagonal #2. The left anterior descending was somewhat tortuous.  The previously stented region in the LAD was patent.  Circumflex vessel: The circumflex vessel gave rise to a small OM-1, large bifurcating OM-2 and then went on to end as an AV groove vessel.  The previously stented region in the circumflex vessel was patent.  Right coronary artery: The right coronary artery was a large artery, gave rise to multiple RV marginals, moderate sized PDA, large PL branch which trifurcated.  There was no disease in the right coronary artery.  IMPRESSION: Previously placed stents, remain patent.  No gradient noted on pullback.  End-diastolic pressure of 12.  RECOMMENDATIONS: To consider other etiologies for her chest pain. Dictated by:   Lemont Fillers Fraser Din, M.D. Attending:  Lemont Fillers Fraser Din, M.D. DD:  12/19/00 TD:  12/19/00 Job: [id number]

## 2010-08-28 NOTE — Cardiovascular Report (Signed)
[place]. [facility]  Patient:    Brandi Cline, Brandi Cline Visit Number: [id number] MRN: [id number]          Service Type: CAT Location: [facility] 2899 [id number] Attending Physician:  Mora Appl Proc. Date: 12/06/00 Adm. Date:  12/06/2000                          Cardiac Catheterization  REFERRING PHYSICIANS:  Jerl Santos, M.D. and Genene Churn. Sherin Quarry, M.D.  INDICATIONS FOR PROCEDURE:  Early positive stress test.  BRIEF HISTORY:  The patient is a 77 year old female, who has been diagnosed with COPD in the last three years.  She was scheduled for a stress Cardiolite for further evaluation of her chest discomfort which she has had for the past three weeks.  The chest discomfort is becoming more frequent and provoked by less exertion.  She underwent a stress Cardiolite.  At 2 minutes into the stress test she was noted to have ST depression with abrupt onset of shortness of breath.  The ECG at peak revealed sinus tachycardia with 2 mm of horizontal ST depression.  At 4 minutes into the test she developed her typical chest tightness.  Based on these findings, it was elected to proceed with coronary angiography.  Of note, she underwent a stress Cardiolite which did not reveal reversible disease.  DESCRIPTION OF PROCEDURE:  After obtaining written informed consent, the patient was brought to the cardiac catheterization lab in the postabsorptive state.  Preoperative sedation was achieved using IV Versed.  The right groin was prepped and draped in the usual sterile fashion.  Local anesthesia was achieved using 1% Xylocaine.  A 6 French hemostasis sheath was placed into the right femoral artery using modified Seldinger technique.  Selective coronary angiography was performed using a JL4, JR4 Judkins catheter.  Multiple views were obtained.  All catheter exchanges were made over a guide wire.  Single plane ventriculogram was performed in the RAO position using a 6  French straight pigtail curved catheter.  Following review of the films, it was felt that intervention would be the patients best option.  Dr. Verdis Prime was consulted to review the films.  The patient was transferred to the holding area until films could be reviewed.  FINDINGS:  The aortic pressure is 150/65, LV pressure is 149/11.   Single plane ventriculogram revealed normal wall motion. The ejection fraction was approximately 65%.  CORONARY ANGIOGRAPHY:  Left main coronary artery:  The left main coronary artery bifurcates into the left anterior descending and circumflex vessel. There was no significant disease in the left main coronary artery.  Left anterior descending:  The left anterior descending gave rise to a moderate diagonal #1 and went on to end as an apical recurrent branch. There was a 70% lesion in the left anterior descending following the first diagonal.  Circumflex vessel:  The circumflex vessel was a large tortuous vessel.  There was an 80-90% proximal lesion noted in the circumflex.  It gave rise to a small OM-1, and a large trifurcating OM-2.  The circumflex went on to end as an AV groove vessel.  Right coronary artery:  The right coronary artery is was a large dominant artery that gave rise to three RV marginals and a moderate sized PDA as well as PL branch.  IMPRESSION:  Critical disease involving the mid left anterior descending and proximal circumflex.  The films are to be reviewed with Dr. Verdis Prime. Further  recommendations pending the review of the films. Attending Physician:  Mora Appl DD:  12/06/00 TD:  12/06/00 Job: [id number]

## 2010-09-03 ENCOUNTER — Encounter: Payer: Self-pay | Admitting: Internal Medicine

## 2010-09-14 ENCOUNTER — Ambulatory Visit (INDEPENDENT_AMBULATORY_CARE_PROVIDER_SITE_OTHER): Payer: Medicare Other | Admitting: Internal Medicine

## 2010-09-14 ENCOUNTER — Ambulatory Visit (INDEPENDENT_AMBULATORY_CARE_PROVIDER_SITE_OTHER)
Admission: RE | Admit: 2010-09-14 | Discharge: 2010-09-14 | Disposition: A | Discharge status: Home / Self Care | Payer: Medicare Other | Source: Ambulatory Visit | Attending: Internal Medicine | Admitting: Internal Medicine

## 2010-09-14 ENCOUNTER — Encounter: Payer: Self-pay | Admitting: Internal Medicine

## 2010-09-14 VITALS — BP 138/62 | HR 69 | Ht 65.0 in | Wt 144.8 lb

## 2010-09-14 DIAGNOSIS — J449 Chronic obstructive pulmonary disease, unspecified: Secondary | ICD-10-CM

## 2010-09-14 DIAGNOSIS — J309 Allergic rhinitis, unspecified: Secondary | ICD-10-CM

## 2010-09-14 DIAGNOSIS — K219 Gastro-esophageal reflux disease without esophagitis: Secondary | ICD-10-CM

## 2010-09-14 MED ORDER — TIOTROPIUM BROMIDE MONOHYDRATE 18 MCG IN CAPS: 18.0000 ug | ORAL_CAPSULE | Freq: Every day | RESPIRATORY_TRACT | Status: DC

## 2010-09-14 NOTE — Assessment & Plan Note (Signed)
Controlled.  

## 2010-09-14 NOTE — Assessment & Plan Note (Signed)
Known reflux. We will have her try bid acid blocker, reduce the strength of her inhaler, and see if that affects her hoarseness. She wishes to try those measures before referral to ENT.

## 2010-09-14 NOTE — Progress Notes (Signed)
  Subjective:    Patient ID: Brandi Cline, female    DOB: 03-03-1931, 77 y.o.   MRN: [id number]  HPI 09/14/10- 39 Yo F followed for allergic rhinitis, COPD, complicated by GERD Last here March 10, 2010- note reviewed.  Since last here she has not had any more significant respiratory problems or infections. Concerned about persistent variable hoarseness. Denies choke or strangle in sleep or with meals. Gradually worse over 6-8 months.  She has been alternating between [facility] and Symbicort. Minor dry cough at times. Some postnasal drip. Aware of minimal reflux and has been told on barium swallow that she had severe reflux- last year.    Review of Systems Constitutional:   No weight loss, night sweats,  Fevers, chills, fatigue, lassitude. HEENT:   No headaches,  Difficulty swallowing,  Tooth/dental problems,  Sore throat,                No sneezing, itching, ear ache, nasal congestion,   CV:  No chest pain,  Orthopnea, PND, swelling in lower extremities, anasarca, dizziness, palpitations  GI  No heartburn, indigestion, abdominal pain, nausea, vomiting, diarrhea, change in bowel habits, loss of appetite  Resp: No shortness of breath with exertion or at rest. ,  No coughing up of blood.  No change in color of mucus.  No wheezing.   Skin: no rash or lesions.  GU: no dysuria, change in color of urine, no urgency or frequency.  No flank pain.  MS:  No joint pain or swelling.  No decreased range of motion.  No back pain.  Psych:  No change in mood or affect. No depression or anxiety.  No memory loss.      Objective:   Physical Exam General- Alert, Oriented, Affect-appropriate, Distress- none acute  Skin- rash-none, lesions- none, excoriation- none  Lymphadenopathy- none  Head- atraumatic  Eyes- Gross vision intact, PERRLA, conjunctivae clear secretions  Ears- Hearing, canals, Tm - normal  Nose- Clear, no- Septal dev, mucus, polyps, erosion, perforation   Throat- Mallampati II ,  mucosa clear , drainage- none, tonsils- atrophic  Neck- flexible , trachea midline, no stridor , thyroid nl, carotid no bruit  Chest - symmetrical excursion , unlabored     Heart/CV- RRR , no murmur , no gallop  , no rub, nl s1 s2                     - JVD- none , edema- none, stasis changes- none, varices- none     Lung- Few crackles right > left wheeze- none, cough- none , dullness-none, rub- none     Chest wall-   Abd- tender-no, distended-no, bowel sounds-present, HSM- no  Br/ Gen/ Rectal- Not done, not indicated  Extrem- cyanosis- none, clubbing, none, atrophy- none, strength- nl  Neuro- grossly intact to observation        Assessment & Plan:

## 2010-09-14 NOTE — Patient Instructions (Signed)
Instead of Symbicort 160 or Dulera 200,   Use Symbicort 80   2 puffs and rinse mouth twice daily - watch over a few weeks to see if this helps your hoarseness  Spiriva refilled

## 2010-09-14 NOTE — Assessment & Plan Note (Signed)
See if we can get away with Symbicort 80 to reduce the effect of the higher steroid inhalers on her throat.

## 2010-09-18 ENCOUNTER — Telehealth: Payer: Self-pay | Admitting: Internal Medicine

## 2010-09-18 NOTE — Telephone Encounter (Signed)
Called spoke with patient, advised of cxr results as stated by CDY.  Pt verbalized her understanding.

## 2010-12-31 LAB — BASIC METABOLIC PANEL
BUN: 22
BUN: 11
BUN: 16
CO2: 29
CO2: 25
CO2: 25
Calcium: 9.1
Calcium: 9.1
Calcium: 9.3
Chloride: 105
Chloride: 98
Creatinine, Ser: 0.82
Creatinine, Ser: 1
GFR calc Af Amer: >60
GFR calc Af Amer: >60
GFR calc non Af Amer: >60
GFR calc non Af Amer: 41 — ABNORMAL LOW
GFR calc non Af Amer: 54 — ABNORMAL LOW
Glucose, Bld: 133 — ABNORMAL HIGH
Glucose, Bld: 111 — ABNORMAL HIGH
Glucose, Bld: 133 — ABNORMAL HIGH
Potassium: 2.9 — ABNORMAL LOW
Potassium: 3.5
Potassium: 3.6
Sodium: 141
Sodium: 131 — ABNORMAL LOW
Sodium: 133 — ABNORMAL LOW

## 2010-12-31 LAB — URINALYSIS, ROUTINE W REFLEX MICROSCOPIC
Bilirubin Urine: NEGATIVE
Ketones, ur: NEGATIVE
Leukocytes, UA: NEGATIVE
Nitrite: NEGATIVE
Protein, ur: NEGATIVE
pH: 5.5

## 2010-12-31 LAB — POTASSIUM: Potassium: 4.7

## 2010-12-31 LAB — HEMOGLOBIN AND HEMATOCRIT, BLOOD
HCT: 37.4
Hemoglobin: 13

## 2011-01-07 LAB — CBC
Hemoglobin: 13
Hemoglobin: 13.8
MCHC: 33.7
MCV: 88.1
Platelets: 128 — ABNORMAL LOW
RBC: 4.64
RDW: 13.7
RDW: 14.2
WBC: 6.6

## 2011-01-07 LAB — COMPREHENSIVE METABOLIC PANEL
ALT: 48 — ABNORMAL HIGH
BUN: 12
CO2: 25
Calcium: 9.6
Creatinine, Ser: 0.57
GFR calc non Af Amer: >60
Glucose, Bld: 132 — ABNORMAL HIGH
Total Protein: 6.6

## 2011-01-07 LAB — CARDIAC PANEL(CRET KIN+CKTOT+MB+TROPI)
CK, MB: 2.3
Relative Index: INVALID
Relative Index: INVALID
Total CK: 82
Total CK: 86
Total CK: 93
Troponin I: 0.01

## 2011-01-07 LAB — DIFFERENTIAL
Eosinophils Absolute: 0.1
Lymphocytes Relative: 25
Lymphs Abs: 2
Neutro Abs: 5.2
Neutrophils Relative %: 68

## 2011-01-07 LAB — MAGNESIUM: Magnesium: 2

## 2011-01-07 LAB — B-NATRIURETIC PEPTIDE (CONVERTED LAB): Pro B Natriuretic peptide (BNP): 41

## 2011-01-21 ENCOUNTER — Other Ambulatory Visit: Payer: Self-pay | Admitting: Internal Medicine

## 2011-01-21 DIAGNOSIS — Z1231 Encounter for screening mammogram for malignant neoplasm of breast: Secondary | ICD-10-CM

## 2011-03-11 ENCOUNTER — Ambulatory Visit
Admission: RE | Admit: 2011-03-11 | Discharge: 2011-03-11 | Disposition: A | Discharge status: Home / Self Care | Payer: Medicare Other | Source: Ambulatory Visit | Attending: Internal Medicine | Admitting: Internal Medicine

## 2011-03-11 DIAGNOSIS — Z1231 Encounter for screening mammogram for malignant neoplasm of breast: Secondary | ICD-10-CM

## 2011-03-16 ENCOUNTER — Encounter: Payer: Self-pay | Admitting: Internal Medicine

## 2011-03-16 ENCOUNTER — Ambulatory Visit (INDEPENDENT_AMBULATORY_CARE_PROVIDER_SITE_OTHER): Payer: Medicare Other | Admitting: Internal Medicine

## 2011-03-16 VITALS — BP 170/78 | HR 79 | Ht 65.0 in | Wt 146.4 lb

## 2011-03-16 DIAGNOSIS — J449 Chronic obstructive pulmonary disease, unspecified: Secondary | ICD-10-CM

## 2011-03-16 NOTE — Patient Instructions (Signed)
Please call as needed 

## 2011-03-16 NOTE — Progress Notes (Signed)
Patient ID: Brandi Cline, female    DOB: 06-15-1930, 77 y.o.   MRN: [id number]  HPI 09/14/10- 60 Yo F followed for allergic rhinitis, COPD, complicated by GERD Last here March 10, 2010- note reviewed.  Since last here she has not had any more significant respiratory problems or infections. Concerned about persistent variable hoarseness. Denies choke or strangle in sleep or with meals. Gradually worse over 6-8 months.  She has been alternating between [facility] and Symbicort. Minor dry cough at times. Some postnasal drip. Aware of minimal reflux and has been told on barium swallow that she had severe reflux- last year.   03/16/11-  77 Yo F followed for allergic rhinitis, COPD, complicated by GERD, Crohn's. Has had flu vaccine. Coughing again with throat tickle that goes comes, him up to a time. She does feels the irritation in the right side of her throat. Denies sinus drainage or headache. Rarely notices a little wheeze. Sometimes brings up small amounts of green sputum. Aware of ongoing reflux while continuing Nexium twice daily. She still bowls and does her own housework. Occasional hoarseness. This started an over-the-counter "allergy pill". Some days notices shortness of breath. PFT- 03/13/09-reviewed with her-severe COPD, FEV1/FVC 0.33 with no response to bronchodilator, hyperinflation, diffusion capacity reduced at 48%.   Review of Systems Constitutional:   No-   weight loss, night sweats, fevers, chills, fatigue, lassitude. HEENT:   No-  headaches, difficulty swallowing, tooth/dental problems, sore throat,       No-  sneezing, itching, ear ache, nasal congestion, post nasal drip,  CV:  No-   chest pain, orthopnea, PND, swelling in lower extremities, anasarca,                                  dizziness, palpitations Resp: + shortness of breath with exertion or at rest.              +  productive cough,  No non-productive cough,  No- coughing up of blood.              No-   change in color of  mucus.  No- wheezing.   Skin: No-   rash or lesions. GI:  +  heartburn, indigestion, abdominal pain, nausea, vomiting, diarrhea,                 change in bowel habits, loss of appetite GU: No-   dysuria, change in color of urine, no urgency or frequency.  No- flank pain. MS:  No-   joint pain or swelling.  No- decreased range of motion.  No- back pain. Neuro-     nothing unusual Psych:  No- change in mood or affect. No depression or anxiety.  No memory loss.     Objective:   Physical Exam General- Alert, Oriented, Affect-appropriate, Distress- none acute; trim Skin- rash-none, lesions- none, excoriation- none Lymphadenopathy- none Head- atraumatic            Eyes- Gross vision intact, PERRLA, conjunctivae clear secretions            Ears- Hearing, canals-normal            Nose- Clear, no-Septal dev, mucus, polyps, erosion, perforation             Throat- Mallampati II , mucosa clear , drainage- none, tonsils- atrophic Neck- flexible , trachea midline, no stridor , thyroid nl, carotid no bruit Chest - symmetrical  excursion , unlabored           Heart/CV- RRR , no murmur , no gallop  , no rub, nl s1 s2                           - JVD- none , edema- none, stasis changes- none, varices- none           Lung-  Decreased, clear to P&A, wheeze- none, cough- none , dullness-none, rub- none           Chest wall-  Abd- tender-no, distended-no, bowel sounds-present, HSM- no Br/ Gen/ Rectal- Not done, not indicated Extrem- cyanosis- none, clubbing, none, atrophy- none, strength- nl Neuro- grossly intact to observation

## 2011-03-19 NOTE — Assessment & Plan Note (Signed)
Acute exacerbation of COPD with bronchitis is part of the pattern of waxing and waning chronic bronchitis. Reviewed the use of maintenance medicines such as Qvar.

## 2011-07-15 ENCOUNTER — Telehealth: Payer: Self-pay | Admitting: Internal Medicine

## 2011-07-15 NOTE — Telephone Encounter (Signed)
I spoke with pt and she c/o trouble breathing, cough w/ green phlem, wheezing, chest tx, chest congestion, sinus headache x Saturday. She saw Dr. Kevan Ny on Monday and was given a zpak and prednisone taper. Pt states this is not helping. Starting today she take 4 tablets of the prednisone. Pt is wanting to come in and be seen but nothing available. Please advise Dr. [name], thanks  Allergies  Allergen Reactions  . Codeine   . Lisinopril

## 2011-07-15 NOTE — Telephone Encounter (Signed)
Per Cy pt can see TP .[name]  Spoke to [facility] TP doesn't have any appts today ...scheduled pt for fri April 4th  With TP .the patient is aware.advised pt is she got worse to go to the ED.Pt understood  And nothing further was needed.

## 2011-07-16 ENCOUNTER — Encounter: Payer: Self-pay | Admitting: Adult Health

## 2011-07-16 ENCOUNTER — Ambulatory Visit (INDEPENDENT_AMBULATORY_CARE_PROVIDER_SITE_OTHER)
Admission: RE | Admit: 2011-07-16 | Discharge: 2011-07-16 | Disposition: A | Discharge status: Home / Self Care | Payer: Medicare Other | Source: Ambulatory Visit | Attending: Adult Health | Admitting: Adult Health

## 2011-07-16 ENCOUNTER — Ambulatory Visit (INDEPENDENT_AMBULATORY_CARE_PROVIDER_SITE_OTHER): Payer: Medicare Other | Admitting: Adult Health

## 2011-07-16 VITALS — BP 156/78 | HR 94 | Temp 96.9°F | Ht 65.0 in | Wt 141.4 lb

## 2011-07-16 DIAGNOSIS — J449 Chronic obstructive pulmonary disease, unspecified: Secondary | ICD-10-CM

## 2011-07-16 MED ORDER — LEVALBUTEROL HCL 0.63 MG/3ML IN NEBU
0.6300 mg | INHALATION_SOLUTION | Freq: Once | RESPIRATORY_TRACT | Status: AC
  Administered 2011-07-16: 0.63 mg via RESPIRATORY_TRACT

## 2011-07-16 MED ORDER — ALBUTEROL SULFATE (2.5 MG/3ML) 0.083% IN NEBU: 2.5000 mg | INHALATION_SOLUTION | RESPIRATORY_TRACT | Status: DC | PRN

## 2011-07-16 MED ORDER — MOXIFLOXACIN HCL 400 MG PO TABS: 400.0000 mg | ORAL_TABLET | Freq: Every day | ORAL | Status: AC

## 2011-07-16 MED ORDER — METHYLPREDNISOLONE ACETATE 80 MG/ML IJ SUSP
120.0000 mg | Freq: Once | INTRAMUSCULAR | Status: AC
  Administered 2011-07-16: 120 mg via INTRAMUSCULAR

## 2011-07-16 MED ORDER — HYDROCODONE-HOMATROPINE 5-1.5 MG/5ML PO SYRP: 5.0000 mL | ORAL_SOLUTION | Freq: Four times a day (QID) | ORAL | Status: AC | PRN

## 2011-07-16 NOTE — Patient Instructions (Addendum)
Avelox 400mg  daily for 7 days  Mucinex DM Twice daily  As needed  Cough/congestion  Fluids and rest  Taper prednisone as directed.  Albuterol Neb every 4-6 hr As needed  Wheezing  Hydromet 1 tsp every 6 hr As needed  Cough- may make you sleepy.  Please contact office for sooner follow up if symptoms do not improve or worsen or seek emergency care  follow up Dr. [name]  In 1-2 weeks .

## 2011-07-16 NOTE — Assessment & Plan Note (Signed)
Exacerbation  Depo Medrol 120mg  IM x 1  Albuterol neb in office  Xray pending.   Plan:  Avelox 400mg  daily for 7 days  Mucinex DM Twice daily  As needed  Cough/congestion  Fluids and rest  Taper prednisone as directed.  Albuterol Neb every 4-6 hr As needed  Wheezing --new neb set up sent to [facility]  Hydromet 1 tsp every 6 hr As needed  Cough- may make you sleepy.  Please contact office for sooner follow up if symptoms do not improve or worsen or seek emergency care  follow up Dr. [name]  In 1-2 weeks .

## 2011-07-16 NOTE — Progress Notes (Signed)
   Patient ID: Brandi Cline, female    DOB: 05-23-1930, 78 y.o.   MRN: [id number]  HPI 09/14/10- 18 Yo F followed for allergic rhinitis, COPD, complicated by GERD Last here March 10, 2010- note reviewed.  Since last here she has not had any more significant respiratory problems or infections. Concerned about persistent variable hoarseness. Denies choke or strangle in sleep or with meals. Gradually worse over 6-8 months.  She has been alternating between [facility] and Symbicort. Minor dry cough at times. Some postnasal drip. Aware of minimal reflux and has been told on barium swallow that she had severe reflux- last year.   03/16/11-  79 Yo F followed for allergic rhinitis, COPD, complicated by GERD, Crohn's. Has had flu vaccine. Coughing again with throat tickle that goes comes, him up to a time. She does feels the irritation in the right side of her throat. Denies sinus drainage or headache. Rarely notices a little wheeze. Sometimes brings up small amounts of green sputum. Aware of ongoing reflux while continuing Nexium twice daily. She still bowls and does her own housework. Occasional hoarseness. This started an over-the-counter "allergy pill". Some days notices shortness of breath. PFT- 03/13/09-reviewed with her-severe COPD, FEV1/FVC 0.33 with no response to bronchodilator, hyperinflation, diffusion capacity reduced at 48%.  07/16/2011 Acute OV  Complains of tightness in chest, increased SOB, wheezing, prod cough with thick green mucus x1week. Was given zpak and pred taper on 4.1.13, finish abx this AM by PCP . Currently on Prednisone 30mg  today. No hemoptysis or chest pain . No edema . Appetite is good, w/ no n/vd.     Review of Systems Constitutional:   No  weight loss, night sweats,  Fevers, chills,  +fatigue, or  lassitude.  HEENT:   No headaches,  Difficulty swallowing,  Tooth/dental problems, or  Sore throat,                No sneezing, itching, ear ache, + nasal congestion, post nasal drip,    CV:  No chest pain,  Orthopnea, PND, swelling in lower extremities, anasarca, dizziness, palpitations, syncope.   GI  No heartburn, indigestion, abdominal pain, nausea, vomiting, diarrhea, change in bowel habits, loss of appetite, bloody stools.   Resp:   No coughing up of blood.   No chest wall deformity  Skin: no rash or lesions.  GU: no dysuria, change in color of urine, no urgency or frequency.  No flank pain, no hematuria   MS:  No joint pain or swelling.  No decreased range of motion.  No back pain.  Psych:  No change in mood or affect. No depression or anxiety.  No memory loss.      Objective:   Physical Exam  GEN: A/Ox3; pleasant , NAD, elderly   HEENT:  [place]/AT,  EACs-clear, TMs-wnl, NOSE-clear drainage , THROAT-clear, no lesions, no postnasal drip or exudate noted.   NECK:  Supple w/ fair ROM; no JVD; normal carotid impulses w/o bruits; no thyromegaly or nodules palpated; no lymphadenopathy.  RESP  Coarse BS with exp wheezes no accessory muscle use, no dullness to percussion  CARD:  RRR, no m/r/g  , no peripheral edema, pulses intact, no cyanosis or clubbing.  GI:   Soft & nt; nml bowel sounds; no organomegaly or masses detected.  Musco: Warm bil, no deformities or joint swelling noted.   Neuro: alert, no focal deficits noted.    Skin: Warm, no lesions or rashes

## 2011-07-21 ENCOUNTER — Telehealth: Payer: Self-pay | Admitting: Internal Medicine

## 2011-07-21 NOTE — Telephone Encounter (Signed)
I spoke with pt and she states she believes she is having an reaction to the avelox. She started this Saturday and since last night she c/o increase SOB, feels weak, shaking all over, BP is 175/89 and HR 87, and was not able to sleep. Pt states she has no rash and her throat does not feel like it's closing or swollen. I advised pt to seek emergency care bc she sounded like she was having a hard time breathing but refused. She asked I get recs from CDY. Please advise thanks  Allergies  Allergen Reactions  . Codeine   . Lisinopril

## 2011-07-21 NOTE — Telephone Encounter (Signed)
I spoke with pt and advised her of CDY recs. She voiced her understanding. I advised pt if she worsens then she needed to seek emergency care. She voiced her understanding and needed nothing further

## 2011-07-21 NOTE — Telephone Encounter (Signed)
This would be an unusual reaction, but stop the Avelox and see what happens.

## 2011-08-04 ENCOUNTER — Encounter: Payer: Self-pay | Admitting: Internal Medicine

## 2011-08-04 ENCOUNTER — Ambulatory Visit (INDEPENDENT_AMBULATORY_CARE_PROVIDER_SITE_OTHER): Payer: Medicare Other | Admitting: Internal Medicine

## 2011-08-04 VITALS — BP 124/68 | HR 78 | Ht 65.0 in | Wt 141.0 lb

## 2011-08-04 DIAGNOSIS — J449 Chronic obstructive pulmonary disease, unspecified: Secondary | ICD-10-CM

## 2011-08-04 DIAGNOSIS — J309 Allergic rhinitis, unspecified: Secondary | ICD-10-CM

## 2011-08-04 MED ORDER — AZITHROMYCIN 500 MG PO TABS: ORAL_TABLET | ORAL | Status: DC

## 2011-08-04 NOTE — Progress Notes (Signed)
Patient ID: Brandi Cline, female    DOB: Apr 28, 1930, 78 y.o.   MRN: [id number]  HPI 09/14/10- 50 Yo F followed for allergic rhinitis, COPD, complicated by GERD Last here March 10, 2010- note reviewed.  Since last here she has not had any more significant respiratory problems or infections. Concerned about persistent variable hoarseness. Denies choke or strangle in sleep or with meals. Gradually worse over 6-8 months.  She has been alternating between [facility] and Symbicort. Minor dry cough at times. Some postnasal drip. Aware of minimal reflux and has been told on barium swallow that she had severe reflux- last year.   03/16/11-  78 Yo F followed for allergic rhinitis, COPD, complicated by GERD, Crohn's. Has had flu vaccine. Coughing again with throat tickle that goes comes, him up to a time. She does feels the irritation in the right side of her throat. Denies sinus drainage or headache. Rarely notices a little wheeze. Sometimes brings up small amounts of green sputum. Aware of ongoing reflux while continuing Nexium twice daily. She still bowls and does her own housework. Occasional hoarseness. This started an over-the-counter "allergy pill". Some days notices shortness of breath. PFT- 03/13/09-reviewed with her-severe COPD, FEV1/FVC 0.33 with no response to bronchodilator, hyperinflation, diffusion capacity reduced at 48%.  07/16/2011 Acute OV  Complains of tightness in chest, increased SOB, wheezing, prod cough with thick green mucus x1week. Was given zpak and pred taper on 4.1.13, finish abx this AM by PCP . Currently on Prednisone 30mg  today. No hemoptysis or chest pain . No edema . Appetite is good, w/ no n/vd.   08/04/11- 78 Yo F followed for allergic rhinitis, COPD, complicated by GERD, Crohn's  F/U after seeing TP; feels better now; had reaction to Avelox with flushing, head congestion and shaking. Not coughing up colored phelgm anymore. Slowly feeling better.  Review of Systems Constitutional:    No  weight loss, night sweats,  Fevers, chills,  +fatigue, or  lassitude.  HEENT:   No headaches,  Difficulty swallowing,  Tooth/dental problems, or  Sore throat,                No sneezing, itching, ear ache, + nasal congestion, post nasal drip,   CV:  No chest pain,  Orthopnea, PND, swelling in lower extremities, anasarca, dizziness, palpitations, syncope.   GI  No heartburn, indigestion, abdominal pain, nausea, vomiting, diarrhea, change in bowel habits, loss of appetite, bloody stools.   Resp:   No coughing up of blood.   No chest wall deformity  Skin: no rash or lesions.  GU: no dysuria, change in color of urine, no urgency or frequency.  No flank pain, no hematuria   MS:  No joint pain or swelling.  No decreased range of motion.  No back pain.  Psych:  No change in mood or affect. No depression or anxiety.  No memory loss.   Objective:  OBJ- Physical Exam General- Alert, Oriented, Affect-appropriate, Distress- none acute, medium build Skin- rash-none, lesions- none, excoriation- none Lymphadenopathy- none Head- atraumatic            Eyes- Gross vision intact, PERRLA, conjunctivae and secretions clear            Ears- Hearing, canals-normal            Nose- Clear, no-Septal dev, mucus, polyps, erosion, perforation             Throat- Mallampati II , mucosa -red throat , drainage- none, tonsils- atrophic Neck-  flexible , trachea midline, no stridor , thyroid nl, carotid no bruit Chest - symmetrical excursion , unlabored           Heart/CV- RRR , no murmur , no gallop  , no rub, nl s1 s2                           - JVD- none , edema- none, stasis changes- none, varices- none           Lung- clear to P&A, wheeze- none, cough- none , dullness-none, rub- none           Chest wall-  Abd-  Br/ Gen/ Rectal- Not done, not indicated Extrem- cyanosis- none, clubbing, none, atrophy- none, strength- nl Neuro- grossly intact to observation

## 2011-08-04 NOTE — Patient Instructions (Signed)
Script printed for [employer]

## 2011-08-07 NOTE — Assessment & Plan Note (Signed)
Acute bronchitis syndrome did not respond to Zithromax plus prednisone. Which she was given Avelox at last visit it seemed to trigger a systemic reaction with flushing, head congestion and shaking so she only took took a few days before stopping. We think she is clearing now without further antibiotics. After discussion I agreed to give a standby prescription for her to start Zithromax on short notice in hopes of avoiding prolonged episodes.

## 2011-08-07 NOTE — Assessment & Plan Note (Signed)
Feels controls now. Antihistamines if needed.

## 2011-09-14 ENCOUNTER — Ambulatory Visit: Payer: Medicare Other | Admitting: Internal Medicine

## 2011-09-15 ENCOUNTER — Telehealth: Payer: Self-pay | Admitting: Internal Medicine

## 2011-09-15 ENCOUNTER — Ambulatory Visit (INDEPENDENT_AMBULATORY_CARE_PROVIDER_SITE_OTHER)
Admission: RE | Admit: 2011-09-15 | Discharge: 2011-09-15 | Disposition: A | Discharge status: Home / Self Care | Payer: Medicare Other | Source: Ambulatory Visit | Attending: Pulmonary Disease | Admitting: Pulmonary Disease

## 2011-09-15 ENCOUNTER — Ambulatory Visit (INDEPENDENT_AMBULATORY_CARE_PROVIDER_SITE_OTHER): Payer: Medicare Other | Admitting: Pulmonary Disease

## 2011-09-15 ENCOUNTER — Encounter: Payer: Self-pay | Admitting: Pulmonary Disease

## 2011-09-15 VITALS — BP 132/64 | HR 82 | Temp 97.7°F | Ht 65.0 in | Wt 141.8 lb

## 2011-09-15 DIAGNOSIS — J449 Chronic obstructive pulmonary disease, unspecified: Secondary | ICD-10-CM

## 2011-09-15 DIAGNOSIS — J441 Chronic obstructive pulmonary disease with (acute) exacerbation: Secondary | ICD-10-CM

## 2011-09-15 MED ORDER — PREDNISONE 10 MG PO TABS: ORAL_TABLET | ORAL | Status: DC

## 2011-09-15 NOTE — Telephone Encounter (Signed)
LMTCB

## 2011-09-15 NOTE — Patient Instructions (Signed)
Will treat with a course of prednisone.  If you do not see improvement in next 48hrs, please call us.  Continue your inhalers, and can use your nebulizer during day if needed. We must keep in mind the possibility this may be related to your known heart disease.  If your chest discomfort and breathing worsens, you need to go to emergency room Will send you down for cxr now, and will call you with results.

## 2011-09-15 NOTE — Telephone Encounter (Signed)
Please order ONOX on room air and 6 MWT  For dx COPD exacerbation

## 2011-09-15 NOTE — Progress Notes (Signed)
  Subjective:    Patient ID: Brandi Cline, female    DOB: Mar 31, 1931, 78 y.o.   MRN: [id number]  HPI The patient comes in today for an acute sick visit.  She has known significant COPD, and is usually followed by Dr. [name].  She gives a 2 to three-day history of increasing shortness of breath, and this culminated in severe shortness of breath this morning.  She is currently a little better after using her morning bronchodilators and her neb treatment.  She has no significant cough, mucus, or congestion.  She does feel a chest heaviness, especially on the left side.  She has a history of coronary disease with stents, but thinks that chest discomfort is different from her usual angina.  Surprisingly, her oxygen saturations today are excellent.   Review of Systems  Constitutional: Negative for fever and unexpected weight change.  HENT: Positive for rhinorrhea. Negative for ear pain, nosebleeds, congestion, sore throat, sneezing, trouble swallowing, dental problem, postnasal drip and sinus pressure.   Eyes: Negative.  Negative for redness and itching.  Respiratory: Positive for cough, chest tightness, shortness of breath and wheezing.   Cardiovascular: Positive for chest pain. Negative for palpitations and leg swelling.       Left sided chest pain below breast x 1 day  Gastrointestinal: Negative.  Negative for nausea and vomiting.  Genitourinary: Negative.  Negative for dysuria.  Musculoskeletal: Negative.  Negative for joint swelling.  Skin: Negative for rash.  Neurological: Positive for weakness and light-headedness. Negative for headaches.  Hematological: Negative.  Does not bruise/bleed easily.  Psychiatric/Behavioral: Negative.  Negative for dysphoric mood. The patient is not nervous/anxious.        Objective:   Physical Exam Well-developed female in no acute distress Nose without purulence or discharge noted Oropharynx clear Chest with mildly decreased breath sounds, but no wheezes or  rhonchi Cardiac exam with regular rate and rhythm Lower extremities with very mild left ankle edema, no cyanosis, no calf tenderness. Alert and oriented, moves all 4 extremities.       Assessment & Plan:

## 2011-09-15 NOTE — Assessment & Plan Note (Signed)
The patient has had increasing shortness of breath over the last 2-3 days with severe increase in shortness of breath this morning.  She is currently better.  Her oxygen saturations are adequate, and she does not have significant bronchospasm on exam.  It is unclear whether this is related to a COPD exacerbation, but need to keep in mind her history of coronary disease and her complaint of chest pressure.  She feels that her angina in the past has been different from this.  We'll treat with a course of prednisone, but have cautioned her that if it does not improve she needs to let us know.  I also asked her to go to the emergency room if her symptoms worsen.  We'll check a chest x-ray for completeness.  There is really nothing to her history to suggest thromboembolic disease, but need to keep this in mind as well.

## 2011-09-15 NOTE — Telephone Encounter (Signed)
Per Florentina Addison I have put pt on CY's schedule today. Nothing further needed. Hazel Sams

## 2011-09-15 NOTE — Telephone Encounter (Signed)
Please advise 

## 2011-09-16 NOTE — Telephone Encounter (Signed)
Spoke with pt and notified of recs per CDY. Pt verbalized understanding and states nothing further needed. Order sent to [facility] for ONO and appt for 6mw was scheduled.

## 2011-09-20 ENCOUNTER — Ambulatory Visit (INDEPENDENT_AMBULATORY_CARE_PROVIDER_SITE_OTHER): Payer: Medicare Other | Admitting: Internal Medicine

## 2011-09-20 DIAGNOSIS — J449 Chronic obstructive pulmonary disease, unspecified: Secondary | ICD-10-CM

## 2011-09-25 NOTE — Progress Notes (Signed)
Documentation for 6 minute walk test 

## 2011-09-30 ENCOUNTER — Telehealth: Payer: Self-pay | Admitting: Internal Medicine

## 2011-09-30 NOTE — Telephone Encounter (Signed)
Made in error. Brandi Cline  °

## 2011-10-04 ENCOUNTER — Other Ambulatory Visit: Payer: Self-pay | Admitting: Cardiology

## 2011-10-04 ENCOUNTER — Other Ambulatory Visit: Payer: Self-pay | Admitting: Interventional Cardiology

## 2011-10-04 NOTE — H&P (Signed)
Hs/referred back by pulmonology. 2. Brought in meds.      HPI:     General:           Brandi Cline is a 78 yo female followed by Dr Katrinka Blazing with a hx of CAD (status post stent to LAD, 2002. last cardiac cath 1/11 with-- Mod. LAD with < 50% in RCA and Cfx. She also has hx of significant COPD followed by pulmonolgy. Pt was seen by Dr Shelle Iron on 09/15/11 and noted she is more chest heaviness over the last 2-3 months associated with increase SOB. Dr Shelle Iron did 6 minute walk test and SaO2 > 93 % and over night pulse oximetry was also stable. CXR stable per pt report. She has alot of chest tightness and he ask she be seen by cardiology. She describes incresing episodes of chest tightness that feels like a knot in her chest that occurs with little exertion, such as ADLS, or even at times with walking in home. The discomfort does not radiate, rarely occurs at rest. It is associated with increasing SOB. She has not tried NTG sl. Inhalers have slightly helped. She has occasional right jaw pain unrelated to the chest discomfort, not related to eating or jaw movement. Last episode of chest tightness was yesterday. She has been having alot of nausea and using Phenergan prn, especially noted nausea earlier this am, yet she feels her Crohns has been stable and only occasional diarrhea. No GI pain, vomiting, black or bloody BMs, no fever, nor chills. .      ROS:      as noted in HPI, no headache, no neurological changes no falls, numbness, nor visual changes. denies any allergy to IVP dye.     Medical History: ASHD status post stent placement in LAD, 2002. Mod. LAD with < 505% in RCA and Cfx, 2011., Hypercholesterolemia, Chronic bronchitis, Crohn's disease status post 20 inches of terminal ileum and right colon removed in 93 with terminal ileal disease seen on colonoscopy in 2001, Left-sided diverticulosis, GERD, Muscle cramps from hypokalemia, Intermittent vertigo, Chronic urinary tract infection, Mild chronic anxiety, Right lower  rib cage pain, Kidney stones, Borderline glaucoma, Mildly abnormal liver function studies, Shingles involving left eye, with postherpetic neuralgia, low-back pain with right lower extremity sciatica, Fall, 2010, Allergic conjunctivitis, COPD, followed by Dr. Jetty Duhamel, sinusitis, January, 2011 oh hospitalized for chest pain/dyspnea, Rectal stricture/narrowing.      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      Family History:  Father: deceased 35 yrs Prostate CA Mother: deceased 74 yrs multi-infarct dementia, hypertension and uremia Brother 1: deceased aneurysm Brother2: deceased cirrhosis of the liver, peptic ulcer disease, prostate cancer and AAA repair Sister 1: deceased COPD Sister 2: alive Chronic bronchitis Sister 3: alive Breast cancer  2 children alive and well.     Social History:      General: History of smoking  cigarettes:  Former smoker, Quit in year  1993, Pack-year Hx:  20. no Smoking, Quit in 1993 after 20 pack/years. no Alcohol. Caffeine: yes, 2 cups daily. Occupation: Retired Film/video editor. Marital Status: Married from 31 until she was widowed in 2002.     Shoals native.     Medications: Isosorbide Mononitrate 30 MG Tablet Extended Release 24 Hour 1 tablet Once a day,  Amlodipine Besylate 10 MG Tablet 1 tablet Once a day, HCTZ 25 MG 25 MG Tablet 1 tablet q AM, Potassium Chloride 20 MEQ Tablet Extended Release one tablet Once a day, Vitamin B 12 injection 1000ug liquid 1 ml monthly, Magnesium Oxide 400 MG Tablet 3 tablets daily, Centrum Silver Ultra Womens Tablet 1 tablet one daily, Tramadol HCl 50 MG Tablet 1 tablet as needed for pain Once a day, Acyclovir 800 MG Tablet 1 tablet Three times a day for 5 days as needed for fever blisters, Simvastatin 20 MG Tablet  STOPPED 1 MONTH AGO MWF---followed by Dr Kevan Ny, Spiriva HandiHaler 18 MCG Capsule 1 capsule by mouth Once a day, Symbicort 160-4.5 MCG/ACT Aerosol 2 puffs Twice a day, Aspirin EC 81 mg Tablet Delayed Release 1 tablet qd, Fish Oil 1200 MG m 2 tablets qd, Albuterol Sulfate HFA 108 (90 Base) MCG/ACT Aerosol Solution 2 puffs as needed every 4 hrs, Promethazine HCl 12.5 MG Tablet 1 tablet as needed for nausea Three times a day, Azithromycin 250 MG Tablet 2 tablets on the first day, then 1 tablet daily for 4 days Once a day, stop date 10/02/2011, Xanax 0.5 MG Tablet 1 tablet bid prn, Nitroglycerin 0.4 MG Tablet Sublingual 1 tablet under the tongue as needed, Losartan Potassium 50 MG Tablet 1 tablet Once a day, Estradiol 0.5 MG Tablet 1/2 tablet Once a day, Omeprazole 20 MG Capsule Delayed Release 1 capsule Once a day, Albuterol Sulfate (2.5 MG/3ML) 0.083% Nebulization Solution 3 ml as needed a sdirected, Medication List reviewed and reconciled with the patient     Allergies: Codeine, Sulfa drugs, Altace, Lisinopril: itching and a cough, simvastatin: severe muscle aches at 20 mg.      Objective:    Vitals: Wt 143.4, Wt change .4 lb, Ht 64, BMI 24.61, Pulse sitting 96, BP sitting 152/72.     Examination:     Cardiology Exam:         GENERAL APPEARANCE:  NAD, anxious.  HEENT: normal.  CAROTID UPSTROKE: no bruit, upstrokes intact.  JVD: flat.  HEART: regular rate and rhythm, normal S1S2, no rub, no gallop, or click.  HEART MURMUR: 1/6 systolic murmur.  LUNGS: clear to auscultation, no wheezing/rhonchi/rales, bibasilar decrease breath sounds.  ABDOMEN: soft, non-tender,+ bowel sounds.  EXTREMITIES: no leg edema.  PERIPHERAL PULSES: 2+, bilateral.  NEUROLOGIC: grossly intact, cranial nerves intact, gait WNL.  MOOD: normal.            Assessment:    Assessment:  1. Shortness of breath - 786.05 (Primary)   2. Coronary atherosclerosis of unspecified type of vessel, native or graft - 414.00   3. Essential  hypertension, benign - 401.1   4. Coronary atherosclerosis of native coronary artery - 414.01   5. Chest pain - 786.50     Plan:    1. Essential hypertension, benign  Continue Amlodipine Besylate Tablet, 10 MG, 1 tablet, Orally, Once a day ;  Continue HCTZ 25 MG Tablet, 25 MG, 1 tablet, Orally, q AM ;  Continue Potassium Chloride Tablet Extended Release, 20 MEQ, one tablet, Orally, Once a day ;  Continue Losartan Potassium Tablet, 50 MG, 1 tablet, Orally, Once a day .       2. Coronary atherosclerosis of native coronary artery  Continue Aspirin EC Tablet Delayed Release, 81 mg, 1 tablet, Orally, qd ;  Continue Simvastatin Tablet, 20 MG, STOPPED 1 MONTH AGO, Orally, MWF---followed by Dr Kevan Ny .    Diagnostic Imaging:EKG NSR, Boehler,Eileen 10/01/2011 11:33:57 AM >  Updated Dr Katrinka Blazing by phone, previous cath 1/11 reviewed and plan to proceed with cardiac cath next week. Risks and benefits of cardiac catheterization have been reviewed including risk of stroke, heart attack, death, bleeding, renal impariment and arterial damage. There was ample oppurtuny to answer questions. Alternatives were discussed. Patient understands and wishes to proceed. I explained to pt if symptoms worsening to call 911 and do not wait for upcoming procedure. pt verbalized understanding and agrees to increase her Imdur to 60 mg po qd starting today upon returning home.       3. Chest pain  Increase Isosorbide Mononitrate Tablet Extended Release 24 Hour, 30 MG, 2 tablet, Orally, Once a day ;  Continue Nitroglycerin Tablet Sublingual, 0.4 MG, 1 tablet under the tongue, Sublingual, as needed .         LAB: CBC with Diff     WBC 6.0 4.0-11.0 - K/ul        RBC 4.57 4.20-5.40 - M/uL        HGB 14.0 12.0-16.0 - g/dL        HCT 16.1 09.6-04.5 - %        MCH 30.7 27.0-33.0 - pg        MPV 8.9 7.5-10.7 - fL        MCV 92.7 81.0-99.0 - fL        MCHC 33.1 32.0-36.0 - g/dL        RDW 40.9 81.1-91.4 - %        NRBC# 0.00 -          PLT 145 150-400 - K/uL L        NEUT % 72.3 43.3-71.9 - % H       NRBC% 0.00 - %         LYMPH% 14.3 16.8-43.5 - % L       MONO % 11.8 4.6-12.4 - %        EOS % 1.0 0.0-7.8 - %        BASO % 0.6 0.0-1.0 - %        NEUT # 4.3 1.9-7.2 - K/uL         LYMPH# 0.90 1.10-2.70 - K/uL L       MONO # 0.7 0.3-0.8 - K/uL        EOS # 0.1 0.0-0.6 - K/uL        BASO # 0.0 0.0-0.1 - K/uL               Bobetta Korf A 10/01/2011 01:09:10 PM > ok for cath        LAB: Comp Metabolic Panel      GLUCOSE 101 70-99 - mg/dL H       BUN 15 7-82 - mg/dL        CREATININE 9.56 0.60-1.30 - mg/dl        eGFR (NON-AFRICAN AMERICAN) 73 >60 - calc        eGFR (AFRICAN AMERICAN) 89 >60 - calc         SODIUM 134 136-145 - mmol/L L       POTASSIUM 4.6 3.5-5.5 - mmol/L        CHLORIDE 101 98-107 - mmol/L        C02 29 22-32 - mg/dL        ANION GAP 8.7 2.1-30.8 - mmol/L        CALCIUM 9.7 8.6-10.3 - mg/dL        T PROTEIN 6.7 6.5-7.8 -  g/dL        ALBUMIN 4.0 1.6-1.0 - g/dL         T.BILI 1.9 0.3-1.0 - mg/dL H        ALP 960 45-409 - U/L H        AST 52 0-39 - U/L H        ALT 53 0-52 - U/L H              Dee Paden A 10/01/2011 03:32:30 PM > reviewed with Dr Katrinka Blazing, ok for cath cc to PCP [facility] 10/01/2011 04:34:51 PM > Pt notified.To Dr. Marden Noble.        LAB: PT and PTT ([id number])     aPTT 29 24-33 - SEC        INR 1.1 0.8-1.2 -        Prothrombin Time 11.3 9.1-12.0 - SEC               Ritik Stavola A 10/03/2011 07:27:55 PM > ok for cath   Diagnostic Imaging:Chest PA/Lat (Ordered for 10/01/2011) Phairas,Jodi 10/01/2011 12:21:26 PM > , x-ray completed.          Immunizations:       Labs:      Procedure Codes: 78295 EKG I AND R, 85025 ECL CBC PLATELET DIFF, 80053 ECL COMP METABOLIC PANEL, 36415 BLOOD COLLECTION ROUTINE VENIPUNCTURE     Preventive:           Follow Up: HS pending cath (Reason: S/P cardiac cath)        Provider: Michaell Cowing. Emelda Fear, NP  Patient: Brandi Cline, [place]  DOB:  06/15/1930  Date: 10/01/2011

## 2011-10-05 ENCOUNTER — Emergency Department (HOSPITAL_COMMUNITY): Payer: Medicare Other

## 2011-10-05 ENCOUNTER — Inpatient Hospital Stay (HOSPITAL_COMMUNITY)
Admission: EM | Admit: 2011-10-05 | Discharge: 2011-10-08 | DRG: 190 | Disposition: A | Discharge status: Home / Self Care | Payer: Medicare Other | Attending: Internal Medicine | Admitting: Internal Medicine

## 2011-10-05 ENCOUNTER — Encounter (HOSPITAL_COMMUNITY): Payer: Self-pay | Admitting: Pharmacy Technician

## 2011-10-05 ENCOUNTER — Encounter (HOSPITAL_COMMUNITY): Payer: Self-pay | Admitting: *Deleted

## 2011-10-05 DIAGNOSIS — R0600 Dyspnea, unspecified: Secondary | ICD-10-CM

## 2011-10-05 DIAGNOSIS — K5090 Crohn's disease, unspecified, without complications: Secondary | ICD-10-CM | POA: Diagnosis present

## 2011-10-05 DIAGNOSIS — R079 Chest pain, unspecified: Secondary | ICD-10-CM

## 2011-10-05 DIAGNOSIS — J441 Chronic obstructive pulmonary disease with (acute) exacerbation: Principal | ICD-10-CM | POA: Diagnosis present

## 2011-10-05 DIAGNOSIS — I10 Essential (primary) hypertension: Secondary | ICD-10-CM | POA: Diagnosis present

## 2011-10-05 DIAGNOSIS — J189 Pneumonia, unspecified organism: Secondary | ICD-10-CM | POA: Diagnosis present

## 2011-10-05 DIAGNOSIS — F411 Generalized anxiety disorder: Secondary | ICD-10-CM | POA: Diagnosis present

## 2011-10-05 DIAGNOSIS — I2510 Atherosclerotic heart disease of native coronary artery without angina pectoris: Secondary | ICD-10-CM | POA: Diagnosis present

## 2011-10-05 DIAGNOSIS — Z9861 Coronary angioplasty status: Secondary | ICD-10-CM

## 2011-10-05 DIAGNOSIS — Z9049 Acquired absence of other specified parts of digestive tract: Secondary | ICD-10-CM

## 2011-10-05 DIAGNOSIS — J449 Chronic obstructive pulmonary disease, unspecified: Secondary | ICD-10-CM | POA: Diagnosis present

## 2011-10-05 DIAGNOSIS — Z87891 Personal history of nicotine dependence: Secondary | ICD-10-CM

## 2011-10-05 DIAGNOSIS — K219 Gastro-esophageal reflux disease without esophagitis: Secondary | ICD-10-CM | POA: Diagnosis present

## 2011-10-05 DIAGNOSIS — Z85828 Personal history of other malignant neoplasm of skin: Secondary | ICD-10-CM

## 2011-10-05 DIAGNOSIS — E7800 Pure hypercholesterolemia, unspecified: Secondary | ICD-10-CM | POA: Diagnosis present

## 2011-10-05 DIAGNOSIS — B0229 Other postherpetic nervous system involvement: Secondary | ICD-10-CM | POA: Diagnosis present

## 2011-10-05 DIAGNOSIS — Z79899 Other long term (current) drug therapy: Secondary | ICD-10-CM

## 2011-10-05 LAB — DIFFERENTIAL
Basophils Relative: 1 % (ref 0–1)
Eosinophils Absolute: 0.1 10*3/uL (ref 0.0–0.7)
Monocytes Absolute: 0.4 10*3/uL (ref 0.1–1.0)
Monocytes Relative: 7 % (ref 3–12)

## 2011-10-05 LAB — BLOOD GAS, ARTERIAL
Bicarbonate: 22.2 mEq/L (ref 20.0–24.0)
Patient temperature: 98.6
pH, Arterial: 7.401 — ABNORMAL HIGH (ref 7.350–7.400)
pO2, Arterial: 86.1 mmHg (ref 80.0–100.0)

## 2011-10-05 LAB — CBC
HCT: 37.8 % (ref 36.0–46.0)
HCT: 37.2 % (ref 36.0–46.0)
Hemoglobin: 12.8 g/dL (ref 12.0–15.0)
Hemoglobin: 12.9 g/dL (ref 12.0–15.0)
MCH: 30.1 pg (ref 26.0–34.0)
MCHC: 33.9 g/dL (ref 30.0–36.0)
RBC: 4.24 MIL/uL (ref 3.87–5.11)
WBC: 5.8 10*3/uL (ref 4.0–10.5)

## 2011-10-05 LAB — BASIC METABOLIC PANEL
BUN: 17 mg/dL (ref 6–23)
Chloride: 103 mEq/L (ref 96–112)
Creatinine, Ser: 0.72 mg/dL (ref 0.50–1.10)
GFR calc non Af Amer: 80 mL/min — ABNORMAL LOW (ref >90)
Glucose, Bld: 131 mg/dL — ABNORMAL HIGH (ref 70–99)
Potassium: 3.5 mEq/L (ref 3.5–5.1)

## 2011-10-05 LAB — HEPATIC FUNCTION PANEL
AST: 55 U/L — ABNORMAL HIGH (ref 0–37)
Albumin: 2.9 g/dL — ABNORMAL LOW (ref 3.5–5.2)
Bilirubin, Direct: 0.2 mg/dL (ref 0.0–0.3)

## 2011-10-05 LAB — D-DIMER, QUANTITATIVE: D-Dimer, Quant: 0.34 ug/mL-FEU (ref 0.00–0.48)

## 2011-10-05 MED ORDER — ENOXAPARIN SODIUM 40 MG/0.4ML ~~LOC~~ SOLN
40.0000 mg | SUBCUTANEOUS | Status: DC
  Administered 2011-10-05 – 2011-10-07 (×3): 40 mg via SUBCUTANEOUS
  Filled 2011-10-05 (×4): qty 0.4

## 2011-10-05 MED ORDER — SENNA 8.6 MG PO TABS
1.0000 | ORAL_TABLET | Freq: Two times a day (BID) | ORAL | Status: DC
  Administered 2011-10-05: 8.6 mg via ORAL
  Filled 2011-10-05 (×7): qty 1

## 2011-10-05 MED ORDER — ISOSORBIDE MONONITRATE ER 60 MG PO TB24
60.0000 mg | ORAL_TABLET | Freq: Every day | ORAL | Status: DC
  Administered 2011-10-06 – 2011-10-08 (×3): 60 mg via ORAL
  Filled 2011-10-05 (×3): qty 1

## 2011-10-05 MED ORDER — SODIUM CHLORIDE 0.9 % IV SOLN
INTRAVENOUS | Status: DC
  Administered 2011-10-06 (×2): via INTRAVENOUS

## 2011-10-05 MED ORDER — ALBUTEROL SULFATE (5 MG/ML) 0.5% IN NEBU: 2.5000 mg | INHALATION_SOLUTION | Freq: Four times a day (QID) | RESPIRATORY_TRACT | Status: DC

## 2011-10-05 MED ORDER — POTASSIUM CHLORIDE CRYS ER 20 MEQ PO TBCR
20.0000 meq | EXTENDED_RELEASE_TABLET | Freq: Every day | ORAL | Status: DC
  Administered 2011-10-06 – 2011-10-08 (×3): 20 meq via ORAL
  Filled 2011-10-05 (×3): qty 1

## 2011-10-05 MED ORDER — TIOTROPIUM BROMIDE MONOHYDRATE 18 MCG IN CAPS
18.0000 ug | ORAL_CAPSULE | Freq: Every day | RESPIRATORY_TRACT | Status: DC
  Filled 2011-10-05: qty 5

## 2011-10-05 MED ORDER — SODIUM CHLORIDE 0.9 % IJ SOLN
3.0000 mL | Freq: Two times a day (BID) | INTRAMUSCULAR | Status: DC
  Administered 2011-10-05 – 2011-10-07 (×3): 3 mL via INTRAVENOUS

## 2011-10-05 MED ORDER — LEVALBUTEROL HCL 0.63 MG/3ML IN NEBU
0.6300 mg | INHALATION_SOLUTION | Freq: Four times a day (QID) | RESPIRATORY_TRACT | Status: DC
  Administered 2011-10-06 – 2011-10-07 (×8): 0.63 mg via RESPIRATORY_TRACT
  Filled 2011-10-05 (×10): qty 3

## 2011-10-05 MED ORDER — ONDANSETRON HCL 4 MG PO TABS: 4.0000 mg | ORAL_TABLET | Freq: Four times a day (QID) | ORAL | Status: DC | PRN

## 2011-10-05 MED ORDER — IPRATROPIUM BROMIDE 0.02 % IN SOLN
0.5000 mg | Freq: Four times a day (QID) | RESPIRATORY_TRACT | Status: DC
  Administered 2011-10-06 (×2): 0.5 mg via RESPIRATORY_TRACT
  Filled 2011-10-05 (×2): qty 2.5

## 2011-10-05 MED ORDER — ALPRAZOLAM 0.25 MG PO TABS
0.2500 mg | ORAL_TABLET | Freq: Two times a day (BID) | ORAL | Status: DC | PRN
  Administered 2011-10-06 – 2011-10-07 (×3): 0.25 mg via ORAL
  Filled 2011-10-05 (×3): qty 1

## 2011-10-05 MED ORDER — ACETAMINOPHEN 325 MG PO TABS: 650.0000 mg | ORAL_TABLET | Freq: Four times a day (QID) | ORAL | Status: DC | PRN

## 2011-10-05 MED ORDER — LOSARTAN POTASSIUM 50 MG PO TABS
50.0000 mg | ORAL_TABLET | Freq: Every day | ORAL | Status: DC
  Administered 2011-10-06 – 2011-10-08 (×3): 50 mg via ORAL
  Filled 2011-10-05 (×3): qty 1

## 2011-10-05 MED ORDER — DEXTROSE 5 % IV SOLN
1.0000 g | INTRAVENOUS | Status: DC
  Administered 2011-10-06 (×2): 1 g via INTRAVENOUS
  Filled 2011-10-05 (×3): qty 10

## 2011-10-05 MED ORDER — ACETAMINOPHEN 650 MG RE SUPP: 650.0000 mg | Freq: Four times a day (QID) | RECTAL | Status: DC | PRN

## 2011-10-05 MED ORDER — POTASSIUM CHLORIDE CRYS ER 20 MEQ PO TBCR: 20.0000 meq | EXTENDED_RELEASE_TABLET | Freq: Every day | ORAL | Status: DC

## 2011-10-05 MED ORDER — AMLODIPINE BESYLATE 10 MG PO TABS
10.0000 mg | ORAL_TABLET | Freq: Every day | ORAL | Status: DC
  Administered 2011-10-06 – 2011-10-08 (×3): 10 mg via ORAL
  Filled 2011-10-05 (×3): qty 1

## 2011-10-05 MED ORDER — DOCUSATE SODIUM 100 MG PO CAPS
100.0000 mg | ORAL_CAPSULE | Freq: Two times a day (BID) | ORAL | Status: DC
  Administered 2011-10-05: 100 mg via ORAL
  Filled 2011-10-05 (×7): qty 1

## 2011-10-05 MED ORDER — AZITHROMYCIN 250 MG PO TABS
250.0000 mg | ORAL_TABLET | Freq: Every day | ORAL | Status: DC
  Administered 2011-10-06: 250 mg via ORAL
  Filled 2011-10-05: qty 1

## 2011-10-05 MED ORDER — NITROGLYCERIN 0.4 MG SL SUBL: 0.4000 mg | SUBLINGUAL_TABLET | SUBLINGUAL | Status: DC | PRN

## 2011-10-05 MED ORDER — IPRATROPIUM BROMIDE 0.02 % IN SOLN: 0.5000 mg | Freq: Four times a day (QID) | RESPIRATORY_TRACT | Status: DC

## 2011-10-05 MED ORDER — ONDANSETRON HCL 4 MG/2ML IJ SOLN
4.0000 mg | Freq: Four times a day (QID) | INTRAMUSCULAR | Status: DC | PRN
  Administered 2011-10-05: 4 mg via INTRAVENOUS
  Filled 2011-10-05: qty 2

## 2011-10-05 MED ORDER — OMEGA-3-ACID ETHYL ESTERS 1 G PO CAPS
2.0000 g | ORAL_CAPSULE | Freq: Two times a day (BID) | ORAL | Status: DC
  Administered 2011-10-05 – 2011-10-08 (×6): 2 g via ORAL
  Filled 2011-10-05 (×7): qty 2

## 2011-10-05 MED ORDER — BUDESONIDE-FORMOTEROL FUMARATE 160-4.5 MCG/ACT IN AERO
2.0000 | INHALATION_SPRAY | Freq: Two times a day (BID) | RESPIRATORY_TRACT | Status: DC
  Administered 2011-10-06: 2 via RESPIRATORY_TRACT
  Filled 2011-10-05: qty 6

## 2011-10-05 MED ORDER — HYDROCHLOROTHIAZIDE 25 MG PO TABS
25.0000 mg | ORAL_TABLET | Freq: Every day | ORAL | Status: DC
  Administered 2011-10-06 – 2011-10-08 (×3): 25 mg via ORAL
  Filled 2011-10-05 (×3): qty 1

## 2011-10-05 MED ORDER — AZITHROMYCIN 500 MG PO TABS
500.0000 mg | ORAL_TABLET | Freq: Every day | ORAL | Status: AC
  Administered 2011-10-06: 500 mg via ORAL
  Filled 2011-10-05: qty 1

## 2011-10-05 MED ORDER — OMEGA-3 FATTY ACIDS 1000 MG PO CAPS: 2.0000 g | ORAL_CAPSULE | Freq: Two times a day (BID) | ORAL | Status: DC

## 2011-10-05 NOTE — ED Notes (Signed)
Pt . Continues to have sob with exertion, She denies any chest pain , skin is w/d, resp. E/u , family at bedside

## 2011-10-05 NOTE — ED Provider Notes (Signed)
Dr. Jayme Cloud with [facility] will see patient tonight. Pt complaining of subjective shortness of breath. Cardiac Cath scheduled to be done 2 days, she currently has two stents.   PCP: Dr. Kevan Ny   When pt is walked to the bathroom, less than 10 steps, she becomes too labored in breathing to continue to walk on her own. She has a pmh of COPD, hypertension, and Crohns dz. The patient is due for a cath in two days and Dr. Effie Shy has advised to stay in the ER until her cath is completed.  I have consulted [facility] who have agreed to admit her to [facility] Team 8, Telemetry.   Dorthula Matas, PA 10/05/11 [phone]

## 2011-10-05 NOTE — ED Notes (Signed)
Pt from home.  Reports chest tightness x 2 weeks.  Pt became SOB (hx of COPD) today so decided to call EMS.  EKG unremarkable.  Pt breathing through pursed lips, bilaterally clear BS.  No acute distress.  Vitals stable.  2SL nitro, 324 asa.  Pt noted to have a cough-productive yellow sputum.  20ga R-wrist.

## 2011-10-05 NOTE — H&P (Addendum)
H&P      HPI:   General:  Brandi Cline is a 78 yo female followed by Dr Katrinka Blazing with a hx of CAD (status post stent to LAD, 2002. last cardiac cath 1/11 with-- Mod. LAD with < 50% in RCA and Cfx. She also has hx of significant COPD followed by pulmonolgy and sees Dr Shelle Iron .She presented with persistent, and worsening, shortness of breath. She was treated by EMS prior to and during transport with subsequent nitroglycerin aspirin, and oxygen. The patient feels better after oxygen . She's had a productive cough of yellow sputum for 5 days; which is an improvement from green sputum following a course of Zithromax.    . Pt was seen by Dr Shelle Iron on 09/15/11 and noted she is more chest heaviness over the last 2-3 months described as chest pressure ,  associated with increase SOB. Dr Shelle Iron did 6 minute walk test and SaO2 > 93 % and over night pulse oximetry was also stable. CXR stable per pt report. She has alot of chest tightness and he asked she be seen by cardiology. She describes incresing episodes of chest tightness that feels like a knot in her chest that occurs with little exertion, such as ADLS, or even at times with walking in home. The discomfort does not radiate, rarely occurs at rest. It is associated with increasing SOB. She has not tried NTG sl. Inhalers have slightly helped. She has occasional right jaw pain unrelated to the chest discomfort, not related to eating or jaw movement. Last episode of chest tightness was yesterday. She has been having alot of nausea and using Phenergan prn, especially noted nausea earlier this am, yet she feels her Crohns has been stable and only occasional diarrhea. No GI pain, vomiting, black or bloody BMs, no fever, nor chills. .    ROS:  as noted in HPI, no headache, no neurological changes no falls, numbness, nor visual changes.   denies any allergy to IVP dye.    Past Medical History   Diagnosis  Date   .  Dyspnea    .  Esophageal reflux    .  CAD (coronary  artery disease)    .  Hypertension    .  Crohn's disease    .  Allergic rhinitis    .  COPD (chronic obstructive pulmonary disease)    .  History of kidney stones    .  History of shingles    .  Trigeminal neuralgia      Medical History: ASHD status post stent placement in LAD, 2002. Mod. LAD with < 505% in RCA and Cfx, 2011., Hypercholesterolemia, Chronic bronchitis, Crohn's disease status post 20 inches of terminal ileum and right colon removed in 93 with terminal ileal disease seen on colonoscopy in 2001, Left-sided diverticulosis, GERD, Muscle cramps from hypokalemia, Intermittent vertigo, Chronic urinary tract infection, Mild chronic anxiety, Right lower rib cage pain, Kidney stones, Borderline glaucoma, Mildly abnormal liver function studies, Shingles involving left eye, with postherpetic neuralgia, low-back pain with right lower extremity sciatica, Fall, 2010, Allergic conjunctivitis, COPD, followed by Dr. Jetty Duhamel, sinusitis, January, 2011 [place] hospitalized for chest pain/dyspnea, Rectal stricture/narrowing.    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    Family History: Father: deceased 13 yrs Prostate CA Mother: deceased  82 yrs multi-infarct dementia, hypertension and uremia Brother 1: deceased aneurysm Brother2: deceased cirrhosis of the liver, peptic ulcer disease, prostate cancer and AAA repair Sister 1: deceased COPD Sister 2: alive Chronic bronchitis Sister 3: alive Breast cancer  2 children alive and well.    Social History:  General: History of smoking cigarettes: Former smoker, Quit in year 1993, Pack-year Hx: 20. no Smoking, Quit in 1993 after 20 pack/years. no Alcohol. Caffeine: yes, 2 cups daily. Occupation: Retired Film/video editor. Marital Status: Married  from 52 until she was widowed in 2002.  [place] native.    Medications: Isosorbide Mononitrate 30 MG Tablet Extended Release 24 Hour 1 tablet Once a day, Amlodipine Besylate 10 MG Tablet 1 tablet Once a day, HCTZ 25 MG 25 MG Tablet 1 tablet q AM, Potassium Chloride 20 MEQ Tablet Extended Release one tablet Once a day, Vitamin B 12 injection 1000ug liquid 1 ml monthly, Magnesium Oxide 400 MG Tablet 3 tablets daily, Centrum Silver Ultra Womens Tablet 1 tablet one daily, Tramadol HCl 50 MG Tablet 1 tablet as needed for pain Once a day, Acyclovir 800 MG Tablet 1 tablet Three times a day for 5 days as needed for fever blisters, Simvastatin 20 MG Tablet STOPPED 1 MONTH AGO MWF---followed by Dr Kevan Ny, Spiriva HandiHaler 18 MCG Capsule 1 capsule by mouth Once a day, Symbicort 160-4.5 MCG/ACT Aerosol 2 puffs Twice a day, Aspirin EC 81 mg Tablet Delayed Release 1 tablet qd, Fish Oil 1200 MG m 2 tablets qd, Albuterol Sulfate HFA 108 (90 Base) MCG/ACT Aerosol Solution 2 puffs as needed every 4 hrs, Promethazine HCl 12.5 MG Tablet 1 tablet as needed for nausea Three times a day, Azithromycin 250 MG Tablet 2 tablets on the first day, then 1 tablet daily for 4 days Once a day, stop date 10/02/2011, Xanax 0.5 MG Tablet 1 tablet bid prn, Nitroglycerin 0.4 MG Tablet Sublingual 1 tablet under the tongue as needed, Losartan Potassium 50 MG Tablet 1 tablet Once a day, Estradiol 0.5 MG Tablet 1/2 tablet Once a day, Omeprazole 20 MG Capsule Delayed Release 1 capsule Once a day, Albuterol Sulfate (2.5 MG/3ML) 0.083% Nebulization Solution 3 ml as needed a sdirected, Medication List reviewed and reconciled with the patient   Allergies: Codeine, Sulfa drugs, Altace, Lisinopril: itching and a cough, simvastatin: severe muscle aches at 20 mg.    Objective:  Vitals: Wt 143.4, Wt change .4 lb, Ht 64, BMI 24.61, Pulse sitting 96, BP sitting 152/72.  Examination:  Cardiology Exam:  GENERAL APPEARANCE:Constitutional: She is  oriented to person, place, and time. She appears well-developed and well-nourished. She appears distressed (Uncomfortable).  HENT:  Head: Normocephalic and atraumatic.  Eyes: Conjunctivae and EOM are normal. Pupils are equal, round, and reactive to light.  Neck: Normal range of motion and phonation normal. Neck supple.  Cardiovascular: Normal rate, regular rhythm and intact distal pulses.  Pulmonary/Chest: Effort normal. She has no wheezes. She exhibits no tenderness.  She is breathless at rest  Abdominal: Soft. She exhibits no distension. There is no tenderness. There is no guarding.  Musculoskeletal: Normal range of motion.  Neurological: She is alert and oriented to person, place, and time. She has normal strength. She exhibits normal muscle tone.  Skin: Skin is warm and dry.  Psychiatric: Her behavior is normal. Judgment and thought content normal.  She is anxious  .  Assessment:  Assessment:  1. Shortness of breath - due to copd exacerbation/ early PNA, anginal equivalent  2. Coronary atherosclerosis of  unspecified type of vessel, native or graft  3. Essential hypertension, benign  4. Coronary atherosclerosis of native coronary artery  5. Chest pain   Plan:  1. Essential hypertension, benign Continue Amlodipine Besylate Tablet, 10 MG, 1 tablet, Orally, Once a day ; Continue HCTZ 25 MG Tablet, 25 MG, 1 tablet, Orally, q AM ; Continue Potassium Chloride Tablet Extended Release, 20 MEQ, one tablet, Orally, Once a day ; Continue Losartan Potassium Tablet, 50 MG, 1 tablet, Orally, Once a day .    2. Coronary atherosclerosis of native coronary artery Continue Aspirin EC Tablet Delayed Release, 81 mg, 1 tablet, Orally, qd ; Continue Simvastatin Tablet, 20 MG, Cardiology consulted by EDP for possible cardiac cath scheduled this week.continue  Isosorbide Mononitrate Tablet Extended Release 24 Hour, 30 MG, 2 tablet, Orally, Once a day ; Continue Nitroglycerin Tablet Sublingual, 0.4 MG, 1 tablet  under the tongue, Sublingual, as needed . Cardiology consult called by EDP, to [facility] cardiology   Full code

## 2011-10-05 NOTE — ED Notes (Signed)
Pt reports productive cough that she thinks is the cause of her SOB.  Pt noted to have labored breathing through pursed lips.  Expiratory wheezing noted.  Pt does report a tightness in her chest.  Pt reports nausea and diaphoresis.  No acute distress noted.  Pt A/O x 4.

## 2011-10-05 NOTE — ED Provider Notes (Signed)
History     CSN: [id number]  Arrival date & time 10/05/11  1050   First MD Initiated Contact with Patient 10/05/11 1127      Chief Complaint  Patient presents with  . Chest Pain    (Consider location/radiation/quality/duration/timing/severity/associated sxs/prior treatment) HPI Comments: Brandi Cline is a 78 y.o. Female with persistent, and worsening, shortness of breath. She was treated by EMS prior to and during transport with subsequent left glycerin aspirin, and oxygen. The patient feels better after oxygen . She's had a productive cough of yellow sputum for 5 days; which is an improvement from green sputum following a course of Zithromax. She has increased short of breath  sensation with exertion. She has persistent chest tightness , for 3 weeks. She's been using her usual medications without relief. She was evaluated by cardiology last week, scheduled for a outpatient catheterization in 2 days. Her shortness of breath is progressive, and not manageable currently at home. She has not seen her PCP, recently. She had a test for need of oxygen 3 weeks ago, by a pulmonologist and that was negative.  The history is provided by the patient.    Past Medical History  Diagnosis Date  . Dyspnea   . Esophageal reflux   . CAD (coronary artery disease)   . Hypertension   . Crohn's disease   . Allergic rhinitis   . COPD (chronic obstructive pulmonary disease)   . History of kidney stones   . History of shingles   . Trigeminal neuralgia     Past Surgical History  Procedure Date  . Coronary stents   . Lithotripsy   . Ureteral stent placement   . Cholecystectomy   . Total abdominal hysterectomy   . Partial resections large and small bowel for crohn's   . Breast lumps benign     Family History  Problem Relation Age of Onset  . Breast cancer Sister   . COPD Sister   . Stroke Mother   . Prostate cancer Father   . Prostate cancer Brother   . Heart attack Brother     History    Substance Use Topics  . Smoking status: Former Smoker -- 1.0 packs/day for 30 years    Types: Cigarettes    Quit date: 04/12/1990  . Smokeless tobacco: Not on file  . Alcohol Use: Not on file    OB History    Grav Para Term Preterm Abortions TAB SAB Ect Mult Living                  Review of Systems  All other systems reviewed and are negative.    Allergies  Avelox; Codeine; Milk-related compounds; and Lisinopril  Home Medications   Current Outpatient Rx  Name Route Sig Dispense Refill  . ALBUTEROL SULFATE HFA 108 (90 BASE) MCG/ACT IN AERS Inhalation Inhale 2 puffs into the lungs every 4 (four) hours as needed. Shortness of breath    . ALBUTEROL SULFATE (2.5 MG/3ML) 0.083% IN NEBU Nebulization Take 2.5 mg by nebulization every 4 (four) hours as needed. Shortness of breath    . ALPRAZOLAM 0.5 MG PO TABS Oral Take 0.25 mg by mouth 2 (two) times daily as needed. For anxiety    . AMLODIPINE BESYLATE 10 MG PO TABS Oral Take 10 mg by mouth daily.     . ASPIRIN 81 MG PO TABS Oral Take 81 mg by mouth daily.    . BUDESONIDE-FORMOTEROL FUMARATE 160-4.5 MCG/ACT IN AERO Inhalation Inhale 2 puffs  into the lungs 2 (two) times daily.     [name] ESTRADIOL 0.5 MG PO TABS Oral Take 0.25 mg by mouth daily.     . OMEGA-3 FATTY ACIDS 1000 MG PO CAPS Oral Take 2 g by mouth 2 (two) times daily.    . GUAIFENESIN 400 MG PO TABS Oral Take 400 mg by mouth every 4 (four) hours as needed. For chest congestion    . HYDROCHLOROTHIAZIDE 25 MG PO TABS Oral Take 25 mg by mouth daily.    . ISOSORBIDE MONONITRATE ER 30 MG PO TB24 Oral Take 60 mg by mouth daily.     [name] LOSARTAN POTASSIUM 25 MG PO TABS Oral Take 50 mg by mouth daily.     [name] MAGNESIUM 250 MG PO TABS Oral Take 2 tablets by mouth daily.     [name] ONE-DAILY MULTI VITAMINS PO TABS Oral Take 1 tablet by mouth daily.     [name] NITROGLYCERIN 0.4 MG SL SUBL Sublingual Place 0.4 mg under the tongue every 5 (five) minutes as needed. Chest pain    . OMEPRAZOLE 20 MG PO  CPDR Oral Take 20 mg by mouth daily.     [name] POTASSIUM CHLORIDE CRYS ER 20 MEQ PO TBCR Oral Take 20 mEq by mouth daily.     [name] TIOTROPIUM BROMIDE MONOHYDRATE 18 MCG IN CAPS Inhalation Place 18 mcg into inhaler and inhale daily.    [name] VITAMIN D (ERGOCALCIFEROL) PO Oral Take 1 tablet by mouth every other day.      BP 154/60  Pulse 73  Temp 97.6 F (36.4 C)  Resp 22  SpO2 96%  Physical Exam  Nursing note and vitals reviewed. Constitutional: She is oriented to person, place, and time. She appears well-developed and well-nourished. She appears distressed (Uncomfortable).  HENT:  Head: Normocephalic and atraumatic.  Eyes: Conjunctivae and EOM are normal. Pupils are equal, round, and reactive to light.  Neck: Normal range of motion and phonation normal. Neck supple.  Cardiovascular: Normal rate, regular rhythm and intact distal pulses.   Pulmonary/Chest: Effort normal. She has no wheezes. She exhibits no tenderness.       She is breathless at rest  Abdominal: Soft. She exhibits no distension. There is no tenderness. There is no guarding.  Musculoskeletal: Normal range of motion.  Neurological: She is alert and oriented to person, place, and time. She has normal strength. She exhibits normal muscle tone.  Skin: Skin is warm and dry.  Psychiatric: Her behavior is normal. Judgment and thought content normal.       She is anxious    ED Course  Procedures (including critical care time)  Reevaluation: While on oxygen her saturation is 95%, normal. She is comfortable, and has no respiratory distress or complaint of shortness of breath.    I discussed the case with cardiology, Dr. Anne Fu; he will see as a consultant and help to arrange the cardiac catheterization as an IP in 2 days    Labs Reviewed  BASIC METABOLIC PANEL - Abnormal; Notable for the following:    Glucose, Bld 131 (*)     GFR calc non Af Amer 80 (*)     All other components within normal limits  CBC - Abnormal; Notable for  the following:    Platelets 133 (*)     All other components within normal limits  DIFFERENTIAL  TROPONIN I   Dg Chest Port 1 View  10/05/2011  *RADIOLOGY REPORT*  Clinical Data: Weakness and shortness of breath.  History of prior cardiac stents.  PORTABLE CHEST - 1 VIEW  Comparison: 09/15/2011  Findings: Mild hyperinflation. Midline trachea.  Mild cardiomegaly. No right and no definite left pleural effusion.  Lower lobe predominant interstitial thickening is again identified.  More confluent opacity at the left lung base is similar back to 07/16/2011, given differences in technique.  IMPRESSION: COPD/chronic bronchitis.  Apical and probable left base scarring. If there is ongoing concern of left lower lobe pneumonia, consider PA and lateral radiographs.  Original Report Authenticated By: Consuello Bossier, M.D.     1. Dyspnea   2. Chest pain       MDM  Nonspecific dyspnea, without project the findings of cardiac or pulmonary illness. She is in the process of being evaluated for coronary artery disease. The patient feels that she cannot go home.        Flint Melter, MD 10/05/11 [phone]

## 2011-10-06 DIAGNOSIS — J441 Chronic obstructive pulmonary disease with (acute) exacerbation: Principal | ICD-10-CM

## 2011-10-06 DIAGNOSIS — J189 Pneumonia, unspecified organism: Secondary | ICD-10-CM

## 2011-10-06 DIAGNOSIS — R0609 Other forms of dyspnea: Secondary | ICD-10-CM

## 2011-10-06 DIAGNOSIS — K219 Gastro-esophageal reflux disease without esophagitis: Secondary | ICD-10-CM

## 2011-10-06 LAB — CBC
HCT: 36 % (ref 36.0–46.0)
MCH: 30.4 pg (ref 26.0–34.0)
MCHC: 34.2 g/dL (ref 30.0–36.0)
MCV: 88.9 fL (ref 78.0–100.0)
RDW: 14.2 % (ref 11.5–15.5)

## 2011-10-06 LAB — HEMOGLOBIN A1C: Hgb A1c MFr Bld: 6 % — ABNORMAL HIGH (ref <5.7)

## 2011-10-06 LAB — CARDIAC PANEL(CRET KIN+CKTOT+MB+TROPI)
CK, MB: 1.9 ng/mL (ref 0.3–4.0)
Relative Index: INVALID (ref 0.0–2.5)
Total CK: 51 U/L (ref 7–177)
Total CK: 65 U/L (ref 7–177)

## 2011-10-06 LAB — BASIC METABOLIC PANEL
BUN: 12 mg/dL (ref 6–23)
CO2: 25 mEq/L (ref 19–32)
Chloride: 104 mEq/L (ref 96–112)
Creatinine, Ser: 0.8 mg/dL (ref 0.50–1.10)
GFR calc Af Amer: 80 mL/min — ABNORMAL LOW (ref >90)
Glucose, Bld: 107 mg/dL — ABNORMAL HIGH (ref 70–99)

## 2011-10-06 MED ORDER — METHYLPREDNISOLONE SODIUM SUCC 40 MG IJ SOLR
40.0000 mg | Freq: Two times a day (BID) | INTRAMUSCULAR | Status: DC
  Administered 2011-10-06 (×2): 40 mg via INTRAVENOUS
  Filled 2011-10-06 (×5): qty 1

## 2011-10-06 MED ORDER — METHYLPREDNISOLONE SODIUM SUCC 40 MG IJ SOLR: 30.0000 mg | Freq: Two times a day (BID) | INTRAMUSCULAR | Status: DC

## 2011-10-06 NOTE — ED Provider Notes (Signed)
Medical screening examination/treatment/procedure(s) were performed by non-physician practitioner and as supervising physician I was immediately available for consultation/collaboration.   Javontae Marlette, MD 10/06/11 0025 

## 2011-10-06 NOTE — Progress Notes (Addendum)
Subjective: This disease to presented to the hospital yesterday with increasing shortness of breath.  Has severe COPD but also coronary artery disease and plans were for a heart catheterization tomorrow to rule out recurrent coronary ischemia causing chest tightness and shortness of breath.  She is bringing up some yellow phlegm but has been afebrile.  White count has been normal.  Denies fevers or chills.  Has been primarily concerned with her severe shortness of breath with any activity  Objective: Weight change:   Intake/Output Summary (Last 24 hours) at 10/06/11 0802 Last data filed at 10/06/11 0140  Gross per 24 hour  Intake     50 ml  Output    250 ml  Net   -200 ml    Filed Vitals:   10/05/11 2100 10/05/11 2234 10/06/11 0217 10/06/11 0501  BP: 138/53 142/65  104/41  Pulse: 75 72  77  Temp:  98.2 F (36.8 C)  98.1 F (36.7 C)  TempSrc:  Oral  Oral  Resp: 18 20  18   Height:  5\' 5"  (1.651 m)    Weight:  63.8 kg (140 lb 10.5 oz)    SpO2: 97% 95% 96% 95%    General Appearance: Alert, cooperative, no distress, appears stated age Head: Normocephalic, without obvious abnormality, atraumatic Neck: Supple, symmetrical Lungs: Distant breath sounds throughout, barely audible.  No rales Heart: Regular rate and rhythm, S1 and S2 normal, no murmur, rub or gallop Abdomen: Soft, non-tender, bowel sounds active all four quadrants, no masses, no organomegaly Extremities: Extremities normal, atraumatic, no cyanosis or edema Pulses: 2+ and symmetric all extremities Skin: Skin color, texture, turgor normal, no rashes or lesions Neuro: CNII-XII intact. Normal strength, sensation and reflexes throughout   Lab Results:  Basename 10/06/11 0455 10/05/11 1151  NA 139 138  K 3.9 3.5  CL 104 103  CO2 25 22  GLUCOSE 107* 131*  BUN 12 17  CREATININE 0.80 0.72  CALCIUM 9.0 9.1  MG -- --  PHOS -- --    Basename 10/05/11 2138  AST 55*  ALT 44*  ALKPHOS 159*  BILITOT 0.9  PROT 6.1    ALBUMIN 2.9*   No results found for this basename: LIPASE:2,AMYLASE:2 in the last 72 hours  Basename 10/06/11 0455 10/05/11 2138 10/05/11 1151  WBC 4.0 5.8 --  NEUTROABS -- -- 3.8  HGB 12.3 12.9 --  HCT 36.0 37.2 --  MCV 88.9 87.7 --  PLT 127* 131* --    Basename 10/06/11 0450 10/05/11 2138 10/05/11 1151  CKTOTAL 51 58 --  CKMB 1.9 2.4 --  CKMBINDEX -- -- --  TROPONINI <0.30 <0.30 <0.30   No components found with this basename: POCBNP:3  Basename 10/05/11 1946  DDIMER 0.34    Basename 10/05/11 2138  HGBA1C 6.0*   No results found for this basename: CHOL:2,HDL:2,LDLCALC:2,TRIG:2,CHOLHDL:2,LDLDIRECT:2 in the last 72 hours  Basename 10/05/11 2138  TSH 1.541  T4TOTAL --  T3FREE --  THYROIDAB --   No results found for this basename: VITAMINB12:2,FOLATE:2,FERRITIN:2,TIBC:2,IRON:2,RETICCTPCT:2 in the last 72 hours  Studies/Results: Dg Chest Port 1 View  10/05/2011  *RADIOLOGY REPORT*  Clinical Data: Weakness and shortness of breath.  History of prior cardiac stents.  PORTABLE CHEST - 1 VIEW  Comparison: 09/15/2011  Findings: Mild hyperinflation. Midline trachea.  Mild cardiomegaly. No right and no definite left pleural effusion.  Lower lobe predominant interstitial thickening is again identified.  More confluent opacity at the left lung base is similar back to 07/16/2011, given differences in  technique.  IMPRESSION: COPD/chronic bronchitis.  Apical and probable left base scarring. If there is ongoing concern of left lower lobe pneumonia, consider PA and lateral radiographs.  Original Report Authenticated By: Consuello Bossier, M.D.   Medications: Scheduled Meds:   . amLODipine  10 mg Oral Daily  . azithromycin  500 mg Oral Daily   Followed by  . azithromycin  250 mg Oral Daily  . budesonide-formoterol  2 puff Inhalation BID  . cefTRIAXone (ROCEPHIN)  IV  1 g Intravenous Q24H  . docusate sodium  100 mg Oral BID  . enoxaparin  40 mg Subcutaneous Q24H  . hydrochlorothiazide   25 mg Oral Daily  . ipratropium  0.5 mg Nebulization Q6H  . isosorbide mononitrate  60 mg Oral Daily  . levalbuterol  0.63 mg Nebulization Q6H  . losartan  50 mg Oral Daily  . omega-3 acid ethyl esters  2 g Oral BID  . potassium chloride SA  20 mEq Oral Daily  . senna  1 tablet Oral BID  . sodium chloride  3 mL Intravenous Q12H  . DISCONTD: albuterol  2.5 mg Nebulization QID  . DISCONTD: fish oil-omega-3 fatty acids  2 g Oral BID  . DISCONTD: ipratropium  0.5 mg Nebulization Q6H  . DISCONTD: potassium chloride SA  20 mEq Oral Daily  . DISCONTD: tiotropium  18 mcg Inhalation Daily   Continuous Infusions:   . sodium chloride 50 mL/hr at 10/06/11 0139   PRN Meds:.acetaminophen, acetaminophen, ALPRAZolam, nitroGLYCERIN, ondansetron (ZOFRAN) IV, ondansetron  Assessment/Plan: Patient Active Problem List   Diagnosis Date Noted  . COPD exacerbation - we'll add low-dose Solu-Medrol 40 milligrams every 12  09/15/2011  . HYPERTENSION - controlled  03/21/2007  . CAD - may indeed have recurrent ischemia, coronary.  Dr. Katrinka Blazing aware of admission  03/21/2007  . ALLERGIC RHINITIS 03/21/2007    03/21/2007  . Esophageal reflux 03/21/2007  . CROHN'S DISEASE 03/21/2007  . DYSPNEA 03/21/2007     LOS: 1 day   Jsean Taussig NEVILL 10/06/2011, 8:02 AM

## 2011-10-06 NOTE — Progress Notes (Addendum)
Patient Name: Brandi Cline Date of Encounter: 10/06/2011    SUBJECTIVE: Dyspnea and chest tightness but dissilmilar to prior angina.  TELEMETRY:  NSR: Filed Vitals:   10/06/11 0217 10/06/11 0501 10/06/11 0819 10/06/11 1050  BP:  104/41  126/69  Pulse:  77  83  Temp:  98.1 F (36.7 C)    TempSrc:  Oral    Resp:  18  16  Height:      Weight:      SpO2: 96% 95% 96% 95%    Intake/Output Summary (Last 24 hours) at 10/06/11 1337 Last data filed at 10/06/11 1107  Gross per 24 hour  Intake    253 ml  Output    250 ml  Net      3 ml    LABS: Basic Metabolic Panel:  Basename 10/06/11 0455 10/05/11 1151  NA 139 138  K 3.9 3.5  CL 104 103  CO2 25 22  GLUCOSE 107* 131*  BUN 12 17  CREATININE 0.80 0.72  CALCIUM 9.0 9.1  MG -- --  PHOS -- --   CBC:  Basename 10/06/11 0455 10/05/11 2138 10/05/11 1151  WBC 4.0 5.8 --  NEUTROABS -- -- 3.8  HGB 12.3 12.9 --  HCT 36.0 37.2 --  MCV 88.9 87.7 --  PLT 127* 131* --   Cardiac Enzymes:  Basename 10/06/11 1244 10/06/11 0450 10/05/11 2138  CKTOTAL PENDING 51 58  CKMB 2.1 1.9 2.4  CKMBINDEX -- -- --  TROPONINI <0.30 <0.30 <0.30   BNP: No components found with this basename: POCBNP:3 Hemoglobin A1C:  Basename 10/05/11 2138  HGBA1C 6.0*    Radiology/Studies:  IMPRESSION:  COPD/chronic bronchitis. Apical and probable left base scarring.  If there is ongoing concern of left lower lobe pneumonia, consider  PA and lateral radiographs.   Physical Exam: Blood pressure 126/69, pulse 83, temperature 98.1 F (36.7 C), temperature source Oral, resp. rate 16, height 5\' 5"  (1.651 m), weight 63.8 kg (140 lb 10.5 oz), SpO2 95.00%. Weight change:    S4 gallop. Chest clear  ASSESSMENT:  1. This seems more pulmonary. She is was set for cath tomorrow but I will cancel and follow progress with you.   Plan:  1. Cancel cath for AM.  Signed, Lesleigh Noe 10/06/2011, 1:37 PM

## 2011-10-06 NOTE — Progress Notes (Signed)
Utilization review completed.  

## 2011-10-06 NOTE — Consult Note (Signed)
Name: Brandi Cline MRN: [id number] DOB: 02/22/1931    LOS: 1  Referring Provider:  triad Reason for Referral:  AECOPD  PULMONARY / CRITICAL CARE MEDICINE  HPI:  78 yo wf, pulmonary pt. of dr [name] who presents with purulent yellow-green sputum, fever, chills, no sweats and increased SOB. She is limited in ADL by sob in walking >50 feet flat. Currently SOB at rest.  Took z-pak last week ( self administered per prior discussions with dr [name]) , received prednisone taper after sick OV 09/14/11  Past Medical History  Diagnosis Date  . Dyspnea   . Esophageal reflux   . CAD (coronary artery disease)   . Hypertension   . Crohn's disease   . Allergic rhinitis   . COPD (chronic obstructive pulmonary disease)   . History of kidney stones   . History of shingles   . Trigeminal neuralgia    Past Surgical History  Procedure Date  . Coronary stents   . Lithotripsy   . Ureteral stent placement   . Cholecystectomy   . Total abdominal hysterectomy   . Partial resections large and small bowel for crohn's   . Breast lumps benign    Prior to Admission medications   Medication Sig Start Date End Date Taking? Authorizing Provider  albuterol (PROAIR HFA) 108 (90 BASE) MCG/ACT inhaler Inhale 2 puffs into the lungs every 4 (four) hours as needed. Shortness of breath   Yes Historical Provider, MD  albuterol (PROVENTIL) (2.5 MG/3ML) 0.083% nebulizer solution Take 2.5 mg by nebulization every 4 (four) hours as needed. Shortness of breath 07/16/11 07/15/12 Yes Tammy S Parrett, NP  ALPRAZolam (XANAX) 0.5 MG tablet Take 0.25 mg by mouth 2 (two) times daily as needed. For anxiety   Yes Historical Provider, MD  amLODipine (NORVASC) 10 MG tablet Take 10 mg by mouth daily.    Yes Historical Provider, MD  aspirin 81 MG tablet Take 81 mg by mouth daily.   Yes Historical Provider, MD  budesonide-formoterol (SYMBICORT) 160-4.5 MCG/ACT inhaler Inhale 2 puffs into the lungs 2 (two) times daily.    Yes Historical  Provider, MD  estradiol (ESTRACE) 0.5 MG tablet Take 0.25 mg by mouth daily.    Yes Historical Provider, MD  fish oil-omega-3 fatty acids 1000 MG capsule Take 2 g by mouth 2 (two) times daily.   Yes Historical Provider, MD  guaifenesin (HUMIBID E) 400 MG TABS Take 400 mg by mouth every 4 (four) hours as needed. For chest congestion   Yes Historical Provider, MD  hydrochlorothiazide (HYDRODIURIL) 25 MG tablet Take 25 mg by mouth daily.   Yes Historical Provider, MD  isosorbide mononitrate (IMDUR) 30 MG 24 hr tablet Take 60 mg by mouth daily.    Yes Historical Provider, MD  losartan (COZAAR) 25 MG tablet Take 50 mg by mouth daily.    Yes Historical Provider, MD  Magnesium 250 MG TABS Take 2 tablets by mouth daily.    Yes Historical Provider, MD  Multiple Vitamin (MULTIVITAMIN) tablet Take 1 tablet by mouth daily.    Yes Historical Provider, MD  nitroGLYCERIN (NITROSTAT) 0.4 MG SL tablet Place 0.4 mg under the tongue every 5 (five) minutes as needed. Chest pain   Yes Historical Provider, MD  omeprazole (PRILOSEC) 20 MG capsule Take 20 mg by mouth daily.    Yes Historical Provider, MD  potassium chloride SA (K-DUR,KLOR-CON) 20 MEQ tablet Take 20 mEq by mouth daily.    Yes Historical Provider, MD  tiotropium (SPIRIVA) 18 MCG  inhalation capsule Place 18 mcg into inhaler and inhale daily. 09/14/10  Yes Waymon Budge, MD  VITAMIN D, ERGOCALCIFEROL, PO Take 1 tablet by mouth every other day.   Yes Historical Provider, MD   Allergies Allergies  Allergen Reactions  . Avelox (Moxifloxacin Hcl In Nacl) Shortness Of Breath, Swelling and Rash  . Codeine Other (See Comments)    Couldn't breath  . Milk-Related Compounds Other (See Comments)    Hurts stomach  . Lisinopril Rash    Family History Family History  Problem Relation Age of Onset  . Breast cancer Sister   . COPD Sister   . Stroke Mother   . Prostate cancer Father   . Prostate cancer Brother   . Heart attack Brother    Social History   reports that she quit smoking about 21 years ago. Her smoking use included Cigarettes. She has a 30 pack-year smoking history. She does not have any smokeless tobacco history on file. She reports that she does not drink alcohol or use illicit drugs.  Review Of Systems: Taken extensively see hpi.  Brief patient description:  EWF sob at rest  Events Since Admission:   Current Status: NAD at rest Vital Signs: Temp:  [97.6 F (36.4 C)-98.2 F (36.8 C)] 98.1 F (36.7 C) (06/26 0501) Pulse Rate:  [68-83] 83  (06/26 1050) Resp:  [16-22] 16  (06/26 1050) BP: (104-154)/(41-97) 126/69 mmHg (06/26 1050) SpO2:  [95 %-98 %] 95 % (06/26 1050) FiO2 (%):  [28 %] 28 % (06/26 0217) Weight:  [140 lb 10.5 oz (63.8 kg)] 140 lb 10.5 oz (63.8 kg) (06/25 2234)  Physical Examination: General:  WNWD Neuro:  intact HEENT:  No adenopathy Neck:  No jvd Cardiovascular:  hsr Lungs:  +exp wheeze. Congested cough. Green sputum Abdomen:  +bs Musculoskeletal:  intact Skin:  Warm and dry  Active Problems:  HYPERTENSION  CAD  COPD  Esophageal reflux  COPD exacerbation   ASSESSMENT AND PLAN  PULMONARY  Lab 10/05/11 2248  PHART 7.401*  PCO2ART 36.6  PO2ART 86.1  HCO3 22.2  O2SAT 97.4   Ventilator Settings: Vent Mode:  [-]  FiO2 (%):  [28 %] 28 % CXR:  Dg Chest Port 1 View  10/05/2011  *RADIOLOGY REPORT*  Clinical Data: Weakness and shortness of breath.  History of prior cardiac stents.  PORTABLE CHEST - 1 VIEW  Comparison: 09/15/2011  Findings: Mild hyperinflation. Midline trachea.  Mild cardiomegaly. No right and no definite left pleural effusion.  Lower lobe predominant interstitial thickening is again identified.  More confluent opacity at the left lung base is similar back to 07/16/2011, given differences in technique.  IMPRESSION: COPD/chronic bronchitis.  Apical and probable left base scarring. If there is ongoing concern of left lower lobe pneumonia, consider PA and lateral radiographs.   Original Report Authenticated By: Consuello Bossier, M.D.    ETT:   A:  AECOPD with purulent sputum and bronchitis - failed out pt therapy, note LLL asd on CXR s/o CAP P:   DC symbicort and atrovent. Use Xopenex and steroids . Agree with ceftx , dc azithro since she took this as outpt already O2 as needed BID PPI Solumedrol 40 q 12h  CARDIOVASCULAR  Lab 10/06/11 0450 10/05/11 2138 10/05/11 1151  TROPONINI <0.30 <0.30 <0.30  LATICACIDVEN -- -- --  PROBNP -- 128.3 --   ECG:   Lines:  A: CAD with hx of stents P: Agree with deferring cath, this appears more pulmonary  A: GERD  P: -ppi Steve Minor ACNP [facility] PCCM Pager [phone] till 3 pm If no answer page [phone] 10/06/2011, 12:33 PM   Independently examined pt, evaluated data & formulated above care plan with NP  [facility].  [phone]

## 2011-10-07 ENCOUNTER — Encounter (HOSPITAL_COMMUNITY): Admission: RE | Payer: Self-pay | Source: Ambulatory Visit

## 2011-10-07 ENCOUNTER — Ambulatory Visit (HOSPITAL_COMMUNITY)
Admission: RE | Admit: 2011-10-07 | Payer: Medicare Other | Source: Ambulatory Visit | Admitting: Interventional Cardiology

## 2011-10-07 SURGERY — LEFT HEART CATHETERIZATION WITH CORONARY ANGIOGRAM
Anesthesia: LOCAL

## 2011-10-07 MED ORDER — GUAIFENESIN ER 600 MG PO TB12
600.0000 mg | ORAL_TABLET | Freq: Two times a day (BID) | ORAL | Status: DC
  Administered 2011-10-07 – 2011-10-08 (×3): 600 mg via ORAL
  Filled 2011-10-07 (×5): qty 1

## 2011-10-07 MED ORDER — CEFUROXIME AXETIL 500 MG PO TABS
500.0000 mg | ORAL_TABLET | Freq: Two times a day (BID) | ORAL | Status: DC
  Administered 2011-10-07 – 2011-10-08 (×3): 500 mg via ORAL
  Filled 2011-10-07 (×5): qty 1

## 2011-10-07 MED ORDER — LEVALBUTEROL HCL 0.63 MG/3ML IN NEBU
0.6300 mg | INHALATION_SOLUTION | Freq: Four times a day (QID) | RESPIRATORY_TRACT | Status: DC | PRN
  Filled 2011-10-07: qty 3

## 2011-10-07 MED ORDER — CALCIUM CARBONATE ANTACID 500 MG PO CHEW
3.0000 | CHEWABLE_TABLET | Freq: Once | ORAL | Status: AC
  Administered 2011-10-07: 600 mg via ORAL
  Filled 2011-10-07: qty 3

## 2011-10-07 MED ORDER — LEVALBUTEROL HCL 0.63 MG/3ML IN NEBU
0.6300 mg | INHALATION_SOLUTION | Freq: Three times a day (TID) | RESPIRATORY_TRACT | Status: DC
  Administered 2011-10-08: 0.63 mg via RESPIRATORY_TRACT
  Filled 2011-10-07 (×4): qty 3

## 2011-10-07 MED ORDER — PREDNISONE 50 MG PO TABS
50.0000 mg | ORAL_TABLET | Freq: Every day | ORAL | Status: DC
  Administered 2011-10-07 – 2011-10-08 (×2): 50 mg via ORAL
  Filled 2011-10-07 (×3): qty 1

## 2011-10-07 MED ORDER — PANTOPRAZOLE SODIUM 40 MG PO TBEC
40.0000 mg | DELAYED_RELEASE_TABLET | Freq: Every day | ORAL | Status: DC
  Administered 2011-10-07: 40 mg via ORAL
  Filled 2011-10-07: qty 1

## 2011-10-07 NOTE — Progress Notes (Signed)
1800 . Placed a call to Dr. Kevan Ny re; pt's complaint of indigestion . Dr Clent Ridges called with orders

## 2011-10-07 NOTE — Progress Notes (Signed)
SATURATION QUALIFICATIONS:  Patient Saturations on Room Air at Rest = 94%  Patient Saturations on Room Air while Ambulating = 87%  Patient Saturations on 2 Liters of oxygen while Ambulating = 93%   

## 2011-10-07 NOTE — Progress Notes (Signed)
Subjective: Patient is feeling much better today.  Chest pressure has resolved.  Antibiotic therapy as well as bronchodilators and steroids have helped a great deal.  She has been walking to the bathroom.  She has not ambulated in the halls yet.  Needs O2 saturation checks at room air at rest and also after ambulating.  Appreciate Dr. Reginia Naas help and guidance with her COPD  Objective: Weight change: 0.429 kg (15.1 oz)  Intake/Output Summary (Last 24 hours) at 10/07/11 0831 Last data filed at 10/07/11 0829  Gross per 24 hour  Intake 2740.5 ml  Output    725 ml  Net 2015.5 ml   Filed Vitals:   10/06/11 2117 10/06/11 2120 10/07/11 0221 10/07/11 0627  BP: 96/48 110/49  131/62  Pulse: 77 75 69 75  Temp:  97.7 F (36.5 C)  97.4 F (36.3 C)  TempSrc:  Oral  Oral  Resp:  16 16 16   Height:      Weight:    64.229 kg (141 lb 9.6 oz)  SpO2:  95% 96% 96%    General Appearance: Alert, cooperative, no distress, appears stated age  Head: Normocephalic, without obvious abnormality, atraumatic  Neck: Supple, symmetrical  Lungs: Distant breath sounds throughout, barely audible. No rales  Heart: Regular rate and rhythm, S1 and S2 normal, no murmur, rub or gallop  Extremities: Extremities normal, atraumatic, no cyanosis or edema  Pulses: 2+ and symmetric all extremities  Skin:  no rashes or lesions  Neuro: CNII-XII intact. Normal strength, sensation and reflexes throughout    Lab Results:  Basename 10/06/11 0455 10/05/11 1151  NA 139 138  K 3.9 3.5  CL 104 103  CO2 25 22  GLUCOSE 107* 131*  BUN 12 17  CREATININE 0.80 0.72  CALCIUM 9.0 9.1  MG -- --  PHOS -- --    Basename 10/05/11 2138  AST 55*  ALT 44*  ALKPHOS 159*  BILITOT 0.9  PROT 6.1  ALBUMIN 2.9*   No results found for this basename: LIPASE:2,AMYLASE:2 in the last 72 hours  Basename 10/06/11 0455 10/05/11 2138 10/05/11 1151  WBC 4.0 5.8 --  NEUTROABS -- -- 3.8  HGB 12.3 12.9 --  HCT 36.0 37.2 --  MCV 88.9 87.7  --  PLT 127* 131* --    Basename 10/06/11 1244 10/06/11 0450 10/05/11 2138  CKTOTAL 65 51 58  CKMB 2.1 1.9 2.4  CKMBINDEX -- -- --  TROPONINI <0.30 <0.30 <0.30   No components found with this basename: POCBNP:3  Basename 10/05/11 1946  DDIMER 0.34    Basename 10/05/11 2138  HGBA1C 6.0*   No results found for this basename: CHOL:2,HDL:2,LDLCALC:2,TRIG:2,CHOLHDL:2,LDLDIRECT:2 in the last 72 hours  Basename 10/05/11 2138  TSH 1.541  T4TOTAL --  T3FREE --  THYROIDAB --   No results found for this basename: VITAMINB12:2,FOLATE:2,FERRITIN:2,TIBC:2,IRON:2,RETICCTPCT:2 in the last 72 hours  Studies/Results: Dg Chest Port 1 View  10/05/2011  *RADIOLOGY REPORT*  Clinical Data: Weakness and shortness of breath.  History of prior cardiac stents.  PORTABLE CHEST - 1 VIEW  Comparison: 09/15/2011  Findings: Mild hyperinflation. Midline trachea.  Mild cardiomegaly. No right and no definite left pleural effusion.  Lower lobe predominant interstitial thickening is again identified.  More confluent opacity at the left lung base is similar back to 07/16/2011, given differences in technique.  IMPRESSION: COPD/chronic bronchitis.  Apical and probable left base scarring. If there is ongoing concern of left lower lobe pneumonia, consider PA and lateral radiographs.  Original Report Authenticated  By: Consuello Bossier, M.D.   Medications: Scheduled Meds:   . amLODipine  10 mg Oral Daily  . cefUROXime  500 mg Oral BID WC  . docusate sodium  100 mg Oral BID  . enoxaparin  40 mg Subcutaneous Q24H  . hydrochlorothiazide  25 mg Oral Daily  . isosorbide mononitrate  60 mg Oral Daily  . levalbuterol  0.63 mg Nebulization Q6H  . losartan  50 mg Oral Daily  . omega-3 acid ethyl esters  2 g Oral BID  . potassium chloride SA  20 mEq Oral Daily  . predniSONE  50 mg Oral Q breakfast  . senna  1 tablet Oral BID  . sodium chloride  3 mL Intravenous Q12H  . DISCONTD: azithromycin  250 mg Oral Daily  .  DISCONTD: budesonide-formoterol  2 puff Inhalation BID  . DISCONTD: cefTRIAXone (ROCEPHIN)  IV  1 g Intravenous Q24H  . DISCONTD: ipratropium  0.5 mg Nebulization Q6H  . DISCONTD: methylPREDNISolone (SOLU-MEDROL) injection  40 mg Intravenous Q12H   Continuous Infusions:   . sodium chloride 50 mL/hr at 10/06/11 2224   PRN Meds:.acetaminophen, acetaminophen, ALPRAZolam, nitroGLYCERIN, ondansetron (ZOFRAN) IV, ondansetron  Assessment/Plan:  Patient Active Problem List  Diagnosis  Date Noted  .  COPD exacerbation -  we'll go ahead and change Solu-Medrol to oral prednisone and discontinue Rocephin and start Ceftin for 7 days.  Check room air O2 sats 4 and after ambulation.  May need home O2 09/15/2011  .  HYPERTENSION - controlled  03/21/2007  .  CAD - chest pressure has resolved with current therapies.  He not be ischemic heart disease.  We'll plan for delay cardiac catheterization 03/21/2007  .  ALLERGIC RHINITIS  03/21/2007  03/21/2007  .  Esophageal reflux  03/21/2007  .  CROHN'S DISEASE  03/21/2007  .  DYSPNEA  03/21/2007  Disposition - probable discharge in a.m., June 28 with home oxygen to be set up     LOS: 2 days   Deaire Mcwhirter NEVILL 10/07/2011, 8:31 AM

## 2011-10-07 NOTE — Progress Notes (Addendum)
Name: Brandi Cline MRN: [id number] DOB: December 10, 1930    LOS: 2  Referring Provider:  triad Reason for Referral:  AECOPD  PULMONARY / CRITICAL CARE MEDICINE  HPI:  78 yo wf, pulmonary pt. of dr [name] who presents with purulent yellow-green sputum, fever, chills, no sweats and increased SOB. She is limited in ADL by sob in walking >50 feet flat. Currently SOB at rest.  Took z-pak last week ( self administered per prior discussions with dr [name]) , received prednisone taper after sick OV 09/14/11  Past Medical History  Diagnosis Date  . Dyspnea   . Esophageal reflux   . CAD (coronary artery disease)   . Hypertension   . Crohn's disease   . Allergic rhinitis   . COPD (chronic obstructive pulmonary disease)   . History of kidney stones   . History of shingles   . Trigeminal neuralgia    Allergies Allergies  Allergen Reactions  . Avelox (Moxifloxacin Hcl In Nacl) Shortness Of Breath, Swelling and Rash  . Codeine Other (See Comments)    Couldn't breath  . Milk-Related Compounds Other (See Comments)    Hurts stomach  . Lisinopril Rash    Current Status: Breathing better, c/o cough but not able to bring up mucus  Vital Signs: Temp:  [97.4 F (36.3 C)-98.6 F (37 C)] 97.4 F (36.3 C) (06/27 0627) Pulse Rate:  [69-83] 75  (06/27 0627) Resp:  [16-18] 16  (06/27 0627) BP: (96-131)/(48-69) 131/62 mmHg (06/27 0627) SpO2:  [95 %-96 %] 96 % (06/27 0627) Weight:  [141 lb 9.6 oz (64.229 kg)] 141 lb 9.6 oz (64.229 kg) (06/27 4098)  Physical Examination: General:  WNWD. feels better 6/27, off o2 Neuro:  intact HEENT:  No adenopathy Neck:  No jvd Cardiovascular:  hsr Lungs:  Decreased exp wheeze. Congested cough. Green sputum Abdomen:  +bs Musculoskeletal:  intact Skin:  Warm and dry  Active Problems:  HYPERTENSION  CAD  COPD  Esophageal reflux  COPD exacerbation  Pneumonia, community acquired   ASSESSMENT AND PLAN  PULMONARY  Lab 10/05/11 2248  PHART 7.401*  PCO2ART  36.6  PO2ART 86.1  HCO3 22.2  O2SAT 97.4   Ventilator Settings:   CXR:  Dg Chest Port 1 View  10/05/2011  *RADIOLOGY REPORT*  Clinical Data: Weakness and shortness of breath.  History of prior cardiac stents.  PORTABLE CHEST - 1 VIEW  Comparison: 09/15/2011  Findings: Mild hyperinflation. Midline trachea.  Mild cardiomegaly. No right and no definite left pleural effusion.  Lower lobe predominant interstitial thickening is again identified.  More confluent opacity at the left lung base is similar back to 07/16/2011, given differences in technique.  IMPRESSION: COPD/chronic bronchitis.  Apical and probable left base scarring. If there is ongoing concern of left lower lobe pneumonia, consider PA and lateral radiographs.  Original Report Authenticated By: Consuello Bossier, M.D.    ETT:   A:  AECOPD with purulent sputum and bronchitis - failed out pt therapy, note LLL asd on CXR s/o CAP P:    Use Xopenex and steroids . Agree with ceftx , dc azithro since she took this as outpt already O2 as needed Resume  PPI Solumedrol 40 q 12h - OK to change to PO steroids, slow taper over 2 weeks based on improvement Add mucinex Resume symbicort and spiriva on dc. Desatn to 89% on walking -hopefully will not need O2 on dc  CARDIOVASCULAR  Lab 10/06/11 1244 10/06/11 0450 10/05/11 2138 10/05/11 1151  TROPONINI <0.30 <0.30 <  0.30 <0.30  LATICACIDVEN -- -- -- --  PROBNP -- -- 128.3 --   ECG:   Lines:  A: CAD with hx of stents P: Agree with deferring cath, this appears more pulmonary  A: GERD P: -ppi   Steve Minor ACNP [facility] PCCM Pager [phone] till 3 pm If no answer page [phone] 10/07/2011, 8:50 AM  Independently examined pt, evaluated data & formulated above care plan with NP  [facility].

## 2011-10-07 NOTE — Progress Notes (Signed)
Patient Name: Brandi Cline Date of Encounter: 10/07/2011    SUBJECTIVE:She is breathing much better today. Tightness in chest is resolving.  TELEMETRY:  NSR: Filed Vitals:   10/07/11 0221 10/07/11 0627 10/07/11 1000 10/07/11 1038  BP:  131/62  148/55  Pulse: 69 75  92  Temp:  97.4 F (36.3 C)    TempSrc:  Oral    Resp: 16 16    Height:      Weight:  64.229 kg (141 lb 9.6 oz)    SpO2: 96% 96% 94% 95%    Intake/Output Summary (Last 24 hours) at 10/07/11 1110 Last data filed at 10/07/11 0829  Gross per 24 hour  Intake 2537.5 ml  Output    725 ml  Net 1812.5 ml    LABS: Basic Metabolic Panel:  Basename 10/06/11 0455 10/05/11 1151  NA 139 138  K 3.9 3.5  CL 104 103  CO2 25 22  GLUCOSE 107* 131*  BUN 12 17  CREATININE 0.80 0.72  CALCIUM 9.0 9.1  MG -- --  PHOS -- --   CBC:  Basename 10/06/11 0455 10/05/11 2138 10/05/11 1151  WBC 4.0 5.8 --  NEUTROABS -- -- 3.8  HGB 12.3 12.9 --  HCT 36.0 37.2 --  MCV 88.9 87.7 --  PLT 127* 131* --   Cardiac Enzymes:  Basename 10/06/11 1244 10/06/11 0450 10/05/11 2138  CKTOTAL 65 51 58  CKMB 2.1 1.9 2.4  CKMBINDEX -- -- --  TROPONINI <0.30 <0.30 <0.30   BNP: No components found with this basename: POCBNP:3 Hemoglobin A1C:  Basename 10/05/11 2138  HGBA1C 6.0*    Radiology/Studies:  No new.  Physical Exam: Blood pressure 148/55, pulse 92, temperature 97.4 F (36.3 C), temperature source Oral, resp. rate 16, height 5\' 5"  (1.651 m), weight 64.229 kg (141 lb 9.6 oz), SpO2 95.00%. Weight change: 0.429 kg (15.1 oz)   Seems to be moving more air. No rales.  Less coughing with deep inspiration.  ASSESSMENT:  1. Chest tightness and dyspnea improving with therapy for lungs.   Plan:  1. No role for further cardiac w/u. Will not cath unless something else comes up. Call if we can help further.  Selinda Eon 10/07/2011, 11:10 AM

## 2011-10-08 ENCOUNTER — Inpatient Hospital Stay (HOSPITAL_COMMUNITY): Payer: Medicare Other

## 2011-10-08 MED ORDER — CEFUROXIME AXETIL 500 MG PO TABS: 500.0000 mg | ORAL_TABLET | Freq: Two times a day (BID) | ORAL | Status: AC

## 2011-10-08 MED ORDER — PREDNISONE 10 MG PO TABS: ORAL_TABLET | ORAL | Status: AC

## 2011-10-08 MED ORDER — PREDNISONE 50 MG PO TABS: ORAL_TABLET | ORAL | Status: DC

## 2011-10-08 NOTE — Discharge Summary (Signed)
Physician Discharge Summary  NAME:Brandi Cline  JYN:[id number]  DOB: January 30, 1931   Admit date: 10/05/2011 Discharge date: 10/08/2011  Discharge Diagnoses:  Active Problems:  COPD exacerbation - symptomatically much improved at discharge.  Purulent phlegm has resolved and chest tightness has resolved.  We'll continue prednisone taper over 11 days and 7 more days of antibiotic therapy orally.  Will be discharged on home oxygen therapy to use 2 liters nasal cannula while sleeping and while ambulating   HYPERTENSION - controlled  CAD - cardiac catheterization postponed at this time.  Followup with Dr. Verdis Prime  Esophageal reflux - continue PPI therapy  Left lower lobe scarring    Discharge Condition: Improved and stable  Hospital Course:   Very pleasant 78 year old female with history of COPD followed by Dr. Fannie Knee.  Prior to hospitalization she was having a lot of chest tightness and discomfort and shortness of breath with exertion.  Plans were for cardiac catheterization.  However, she was admitted on 10/05/2011 for increasing respiratory symptoms including purulent phlegm, chest tightness assessment and, and increasing dyspnea on exertion.           [name] has responded very well to first intravenous steroids and Rocephin and she has been changed over to oral prednisone and Ceftin and she was found to have an O2 saturation of 87% on ambulation and has being prescribed home O2. Purulent phlegm has resolved.  She is eager to go home.  We'll followup in the office in one week.  Oxygen saturation testing on June 27 at 6:50 PM:  SATURATION QUALIFICATIONS:  Patient Saturations on Room Air at Rest = 94%  Patient Saturations on [employer] while Ambulating =87%  Patient Saturations on 2 Liters of oxygen while Ambulating =93%       Consults: Treatment Team:     Cardiology: Lesleigh Noe, MD                                                    Pulmonary critical care:  Dr. Cyril Mourning  Disposition:  Plans are for discharge home this morning using portable O2  Discharge physical exam:  Filed Vitals:   10/07/11 1300 10/07/11 2015 10/07/11 2048 10/08/11 0501  BP: 128/66 128/61  121/54  Pulse: 90 79  75  Temp: 97.3 F (36.3 C) 97.6 F (36.4 C)  97.7 F (36.5 C)  TempSrc: Oral Oral  Oral  Resp: 18 18  18   Height:      Weight:    64.048 kg (141 lb 3.2 oz)  SpO2: 96% 98% 95% 98%   General Appearance: Alert, cooperative, no distress, appears stated age  Head: Normocephalic, without obvious abnormality, atraumatic  Neck: Supple, symmetrical  Lungs: Distant breath sounds throughout, barely audible. No rales  Heart: Regular rate and rhythm, S1 and S2 normal, no murmur, rub or gallop  Extremities: Extremities normal, atraumatic, no cyanosis or edema  Pulses: 2+ and symmetric all extremities  Skin: no rashes or lesions  Neuro: CNII-XII intact. Normal strength, sensation and reflexes throughout   Discharge Orders    Future Appointments: Provider: Department: Dept Phone: Center:   12/06/2011 10:15 AM Waymon Budge, MD [facility] [phone] None     Future Orders Please Complete By Expires   For home use only DME oxygen  Comments:   2 liters nasal cannula O2 portable and with home oxygen concentrator.  Recommend using at all times during sleep and also with ambulation     Medication List  As of 10/08/2011  7:35 AM   TAKE these medications         ALPRAZolam 0.5 MG tablet   Commonly known as: XANAX   Take 0.25 mg by mouth 2 (two) times daily as needed. For anxiety      amLODipine 10 MG tablet   Commonly known as: NORVASC   Take 10 mg by mouth daily.      aspirin 81 MG tablet   Take 81 mg by mouth daily.      budesonide-formoterol 160-4.5 MCG/ACT inhaler   Commonly known as: SYMBICORT   Inhale 2 puffs into the lungs 2 (two) times daily.      cefUROXime 500 MG tablet   Commonly known as: CEFTIN   Take 1 tablet (500 mg total) by mouth 2  (two) times daily with a meal.      estradiol 0.5 MG tablet   Commonly known as: ESTRACE   Take 0.25 mg by mouth daily.      fish oil-omega-3 fatty acids 1000 MG capsule   Take 2 g by mouth 2 (two) times daily.      guaifenesin 400 MG Tabs   Commonly known as: HUMIBID E   Take 400 mg by mouth every 4 (four) hours as needed. For chest congestion      hydrochlorothiazide 25 MG tablet   Commonly known as: HYDRODIURIL   Take 25 mg by mouth daily.      isosorbide mononitrate 30 MG 24 hr tablet   Commonly known as: IMDUR   Take 60 mg by mouth daily.      losartan 25 MG tablet   Commonly known as: COZAAR   Take 50 mg by mouth daily.      Magnesium 250 MG Tabs   Take 2 tablets by mouth daily.      multivitamin tablet   Take 1 tablet by mouth daily.      nitroGLYCERIN 0.4 MG SL tablet   Commonly known as: NITROSTAT   Place 0.4 mg under the tongue every 5 (five) minutes as needed. Chest pain      potassium chloride SA 20 MEQ tablet   Commonly known as: K-DUR,KLOR-CON   Take 20 mEq by mouth daily.      predniSONE 10 MG tablet   Commonly known as: DELTASONE   Take 5 pills daily in the morning with food for 3 days and then 4 pills daily for 2 days then 3 pills daily for 2 days then 2 pills daily for 2 days then 1 pill daily for 2 days then discontinue - 5,5,5,4,4,3,3,2,2,1,1      PRILOSEC 20 MG capsule   Generic drug: omeprazole   Take 20 mg by mouth daily.      PROAIR HFA 108 (90 BASE) MCG/ACT inhaler   Generic drug: albuterol   Inhale 2 puffs into the lungs every 4 (four) hours as needed. Shortness of breath      albuterol (2.5 MG/3ML) 0.083% nebulizer solution   Commonly known as: PROVENTIL   Take 2.5 mg by nebulization every 4 (four) hours as needed. Shortness of breath      tiotropium 18 MCG inhalation capsule   Commonly known as: SPIRIVA   Place 18 mcg into inhaler and inhale daily.      VITAMIN D (  ERGOCALCIFEROL) PO   Take 1 tablet by mouth every other day.              Things to follow up in the outpatient setting: If patient develops increasing shortness of breath or fever she should contact physician immediately.  Plans are to followup in office in one week  Time coordinating discharge:  33 minutes  The results of significant diagnostics from this hospitalization (including imaging, microbiology, ancillary and laboratory) are listed below for reference.    Significant Diagnostic Studies: Dg Chest 2 View  09/15/2011  *RADIOLOGY REPORT*  Clinical Data: COPD, hypertension, shortness of breath.  CHEST - 2 VIEW  Comparison: 07/16/2011  Findings: There is hyperinflation of the lungs compatible with COPD.  Scarring in the apices and left base, stable.  No acute infiltrates or effusions.  No acute bony abnormality.  IMPRESSION: COPD/chronic changes.  No active disease.  Original Report Authenticated By: [name], M.D.   Dg Chest Port 1 View  10/05/2011  *RADIOLOGY REPORT*  Clinical Data: Weakness and shortness of breath.  History of prior cardiac stents.  PORTABLE CHEST - 1 VIEW  Comparison: 09/15/2011  Findings: Mild hyperinflation. Midline trachea.  Mild cardiomegaly. No right and no definite left pleural effusion.  Lower lobe predominant interstitial thickening is again identified.  More confluent opacity at the left lung base is similar back to 07/16/2011, given differences in technique.  IMPRESSION: COPD/chronic bronchitis.  Apical and probable left base scarring. If there is ongoing concern of left lower lobe pneumonia, consider PA and lateral radiographs.  Original Report Authenticated By: Consuello Bossier, M.D.    Microbiology: No results found for this or any previous visit (from the past 240 hour(s)).   Labs: Results for orders placed during the hospital encounter of 10/05/11  BASIC METABOLIC PANEL      Component Value Range   Sodium 138  135 - 145 mEq/L   Potassium 3.5  3.5 - 5.1 mEq/L   Chloride 103  96 - 112 mEq/L   CO2 22  19 - 32  mEq/L   Glucose, Bld 131 (*) 70 - 99 mg/dL   BUN 17  6 - 23 mg/dL   Creatinine, Ser 0.98  0.50 - 1.10 mg/dL   Calcium 9.1  8.4 - 11.9 mg/dL   GFR calc non Af Amer 80 (*) >90 mL/min   GFR calc Af Amer >90  >90 mL/min  CBC      Component Value Range   WBC 5.3  4.0 - 10.5 K/uL   RBC 4.25  3.87 - 5.11 MIL/uL   Hemoglobin 12.8  12.0 - 15.0 g/dL   HCT 14.7  82.9 - 56.2 %   MCV 88.9  78.0 - 100.0 fL   MCH 30.1  26.0 - 34.0 pg   MCHC 33.9  30.0 - 36.0 g/dL   RDW 13.0  86.5 - 78.4 %   Platelets 133 (*) 150 - 400 K/uL  DIFFERENTIAL      Component Value Range   Neutrophils Relative 72  43 - 77 %   Neutro Abs 3.8  1.7 - 7.7 K/uL   Lymphocytes Relative 19  12 - 46 %   Lymphs Abs 1.0  0.7 - 4.0 K/uL   Monocytes Relative 7  3 - 12 %   Monocytes Absolute 0.4  0.1 - 1.0 K/uL   Eosinophils Relative 2  0 - 5 %   Eosinophils Absolute 0.1  0.0 - 0.7 K/uL   Basophils  Relative 1  0 - 1 %   Basophils Absolute 0.0  0.0 - 0.1 K/uL  TROPONIN I      Component Value Range   Troponin I <0.30  <0.30 ng/mL  D-DIMER, QUANTITATIVE      Component Value Range   D-Dimer, Quant 0.34  0.00 - 0.48 ug/mL-FEU  CBC      Component Value Range   WBC 5.8  4.0 - 10.5 K/uL   RBC 4.24  3.87 - 5.11 MIL/uL   Hemoglobin 12.9  12.0 - 15.0 g/dL   HCT 91.4  78.2 - 95.6 %   MCV 87.7  78.0 - 100.0 fL   MCH 30.4  26.0 - 34.0 pg   MCHC 34.7  30.0 - 36.0 g/dL   RDW 21.3  08.6 - 57.8 %   Platelets 131 (*) 150 - 400 K/uL  BLOOD GAS, ARTERIAL      Component Value Range   FIO2 0.28     Delivery systems NASAL CANNULA     pH, Arterial 7.401 (*) 7.350 - 7.400   pCO2 arterial 36.6  35.0 - 45.0 mmHg   pO2, Arterial 86.1  80.0 - 100.0 mmHg   Bicarbonate 22.2  20.0 - 24.0 mEq/L   TCO2 23.4  0 - 100 mmol/L   Acid-base deficit 1.8  0.0 - 2.0 mmol/L   O2 Saturation 97.4     Patient temperature 98.6     Collection site RIGHT RADIAL     Drawn by [phone]     Sample type ARTERIAL DRAW     Allens test (pass/fail) PASS  PASS  HEPATIC  FUNCTION PANEL      Component Value Range   Total Protein 6.1  6.0 - 8.3 g/dL   Albumin 2.9 (*) 3.5 - 5.2 g/dL   AST 55 (*) 0 - 37 U/L   ALT 44 (*) 0 - 35 U/L   Alkaline Phosphatase 159 (*) 39 - 117 U/L   Total Bilirubin 0.9  0.3 - 1.2 mg/dL   Bilirubin, Direct 0.2  0.0 - 0.3 mg/dL   Indirect Bilirubin 0.7  0.3 - 0.9 mg/dL  TSH      Component Value Range   TSH 1.541  0.350 - 4.500 uIU/mL  CARDIAC PANEL(CRET KIN+CKTOT+MB+TROPI)      Component Value Range   Total CK 51  7 - 177 U/L   CK, MB 1.9  0.3 - 4.0 ng/mL   Troponin I <0.30  <0.30 ng/mL   Relative Index RELATIVE INDEX IS INVALID  0.0 - 2.5  HEMOGLOBIN A1C      Component Value Range   Hemoglobin A1C 6.0 (*) <5.7 %   Mean Plasma Glucose 126 (*) <117 mg/dL  BASIC METABOLIC PANEL      Component Value Range   Sodium 139  135 - 145 mEq/L   Potassium 3.9  3.5 - 5.1 mEq/L   Chloride 104  96 - 112 mEq/L   CO2 25  19 - 32 mEq/L   Glucose, Bld 107 (*) 70 - 99 mg/dL   BUN 12  6 - 23 mg/dL   Creatinine, Ser 9.52  0.50 - 1.10 mg/dL   Calcium 9.0  8.4 - 84.1 mg/dL   GFR calc non Af Amer 69 (*) >90 mL/min   GFR calc Af Amer 80 (*) >90 mL/min  CBC      Component Value Range   WBC 4.0  4.0 - 10.5 K/uL   RBC 4.05  3.87 - 5.11 MIL/uL  Hemoglobin 12.3  12.0 - 15.0 g/dL   HCT 16.1  09.6 - 04.5 %   MCV 88.9  78.0 - 100.0 fL   MCH 30.4  26.0 - 34.0 pg   MCHC 34.2  30.0 - 36.0 g/dL   RDW 40.9  81.1 - 91.4 %   Platelets 127 (*) 150 - 400 K/uL  CARDIAC PANEL(CRET KIN+CKTOT+MB+TROPI)      Component Value Range   Total CK 58  7 - 177 U/L   CK, MB 2.4  0.3 - 4.0 ng/mL   Troponin I <0.30  <0.30 ng/mL   Relative Index RELATIVE INDEX IS INVALID  0.0 - 2.5  PRO B NATRIURETIC PEPTIDE      Component Value Range   Pro B Natriuretic peptide (BNP) 128.3  0 - 450 pg/mL  CARDIAC PANEL(CRET KIN+CKTOT+MB+TROPI)      Component Value Range   Total CK 65  7 - 177 U/L   CK, MB 2.1  0.3 - 4.0 ng/mL   Troponin I <0.30  <0.30 ng/mL   Relative Index  RELATIVE INDEX IS INVALID  0.0 - 2.5     Signed: Zoeann Mol NEVILL 10/08/2011, 7:35 AM

## 2011-10-08 NOTE — Progress Notes (Signed)
Discharge instructions given Verbalized understanding . Case manager notified for need of home O2  Upon discharge

## 2011-10-08 NOTE — Progress Notes (Signed)
Subjective:  Feeling better, no CP. Ready to go home  Objective:  Vital Signs in the last 24 hours: Temp:  [97.3 F (36.3 C)-97.7 F (36.5 C)] 97.7 F (36.5 C) (06/28 0501) Pulse Rate:  [75-92] 75  (06/28 0501) Resp:  [18] 18  (06/28 0501) BP: (121-148)/(54-66) 121/54 mmHg (06/28 0501) SpO2:  [93 %-98 %] 93 % (06/28 0746) Weight:  [64.048 kg (141 lb 3.2 oz)] 64.048 kg (141 lb 3.2 oz) (06/28 0501)  Intake/Output from previous day: 06/27 0701 - 06/28 0700 In: 960 [P.O.:960] Out: 1125 [Urine:1125]   Physical Exam: General: Well developed, well nourished, in no acute distress. Head:  Normocephalic and atraumatic. Lungs: Clear to auscultation and percussion. Heart: Normal S1 and S2.  No murmur, rubs or gallops.  Abdomen: soft, non-tender, positive bowel sounds. Extremities: No clubbing or cyanosis. No edema. Neurologic: Alert and oriented x 3.    Lab Results:  Basename 10/06/11 0455 10/05/11 2138  WBC 4.0 5.8  HGB 12.3 12.9  PLT 127* 131*    Basename 10/06/11 0455 10/05/11 1151  NA 139 138  K 3.9 3.5  CL 104 103  CO2 25 22  GLUCOSE 107* 131*  BUN 12 17  CREATININE 0.80 0.72    Basename 10/06/11 1244 10/06/11 0450  TROPONINI <0.30 <0.30   Hepatic Function Panel  Basename 10/05/11 2138  PROT 6.1  ALBUMIN 2.9*  AST 55*  ALT 44*  ALKPHOS 159*  BILITOT 0.9  BILIDIR 0.2  IBILI 0.7    Imaging: Dg Chest 2 View  10/08/2011  *RADIOLOGY REPORT*  Clinical Data: Pneumonia, cough, congestion.  CHEST - 2 VIEW  Comparison: 10/05/2011  Findings: There is hyperinflation of the lungs compatible with COPD.  Biapical and bibasilar scarring.  Heart is normal size.  No acute infiltrates or effusions.  No acute bony abnormality.  IMPRESSION: COPD/chronic changes.  No active disease.  Original Report Authenticated By: [name], M.D.   Personally viewed.   Telemetry: No adverse rhythm Personally viewed.   Assessment/Plan:  Active Problems:  HYPERTENSION  CAD   COPD  Esophageal reflux  COPD exacerbation  Pneumonia, community acquired   Dr. Katrinka Blazing will see as outpatient continued. Stable. Cardiac cath cancelled.  Pulmonary issues.   Brandi Cline 10/08/2011, 10:21 AM

## 2011-10-08 NOTE — Progress Notes (Signed)
Name: Brandi Cline MRN: [id number] DOB: 12/13/1930    LOS: 3  Referring Provider:  [facility] Reason for Referral:  [facility]  PULMONARY / CRITICAL CARE MEDICINE  HPI:  78 yo wf, pulmonary pt. of dr [name] who presents with purulent yellow-green sputum, fever, chills, no sweats and increased SOB. She is limited in ADL by sob in walking >50 feet flat. Currently SOB at rest.  Took z-pak last week ( self administered per prior discussions with dr [name]) , received prednisone taper after sick OV 09/14/11  Past Medical History  Diagnosis Date  . Dyspnea   . Esophageal reflux   . CAD (coronary artery disease)   . Hypertension   . Crohn's disease   . Allergic rhinitis   . COPD (chronic obstructive pulmonary disease)   . History of kidney stones   . History of shingles   . Trigeminal neuralgia    Allergies Allergies  Allergen Reactions  . Avelox (Moxifloxacin Hcl In Nacl) Shortness Of Breath, Swelling and Rash  . Codeine Other (See Comments)    Couldn't breath  . Milk-Related Compounds Other (See Comments)    Hurts stomach  . Lisinopril Rash    Current Status: Breathing better, c/o cough but not able to bring up mucus  Vital Signs: Temp:  [97.3 F (36.3 C)-97.7 F (36.5 C)] 97.7 F (36.5 C) (06/28 0501) Pulse Rate:  [75-92] 75  (06/28 0501) Resp:  [18] 18  (06/28 0501) BP: (121-148)/(54-66) 121/54 mmHg (06/28 0501) SpO2:  [93 %-98 %] 93 % (06/28 0746) Weight:  [141 lb 3.2 oz (64.048 kg)] 141 lb 3.2 oz (64.048 kg) (06/28 0501)  Physical Examination: General:  WNWD. feels better 6/28, off o2 Neuro:  intact HEENT:  No adenopathy Neck:  No jvd Cardiovascular:  hsr Lungs:  Decreased exp wheeze.  Abdomen:  +bs Musculoskeletal:  intact Skin:  Warm and dry  Active Problems:  HYPERTENSION  CAD  COPD  Esophageal reflux  COPD exacerbation  Pneumonia, community acquired   ASSESSMENT AND PLAN  PULMONARY  Lab 10/05/11 2248  PHART 7.401*  PCO2ART 36.6  PO2ART 86.1  HCO3  22.2  O2SAT 97.4   Ventilator Settings:   CXR:  Dg Chest 2 View  10/08/2011  *RADIOLOGY REPORT*  Clinical Data: Pneumonia, cough, congestion.  CHEST - 2 VIEW  Comparison: 10/05/2011  Findings: There is hyperinflation of the lungs compatible with COPD.  Biapical and bibasilar scarring.  Heart is normal size.  No acute infiltrates or effusions.  No acute bony abnormality.  IMPRESSION: COPD/chronic changes.  No active disease.  Original Report Authenticated By: [name], M.D.       A:  AECOPD with purulent sputum and bronchitis - failed out pt therapy, note LLL asd on CXR s/o CAP P:    Use Xopenex and steroids . Agree with ceftx , dc azithro since she took this as outpt already O2 as needed Resume  PPI OK to change to PO steroids, slow taper over 2 weeks based on improvement Add mucinex Resume symbicort and spiriva on dc. Desatn to 89% on walking -hopefully will not need O2 on dc Off o2 has appointment with Clint Young in august.  CARDIOVASCULAR  Lab 10/06/11 1244 10/06/11 0450 10/05/11 2138 10/05/11 1151  TROPONINI <0.30 <0.30 <0.30 <0.30  LATICACIDVEN -- -- -- --  PROBNP -- -- 128.3 --   ECG:   Lines:  A: CAD with hx of stents P: Agree with deferring cath, this appears more pulmonary  A: GERD  P: -ppi   Brandi Cline ACNP Brandi Cline PCCM Pager [phone] till 3 pm If no answer page [phone] 10/08/2011, 10:32 AM  Independently examined pt, evaluated data & formulated above care plan with NP  She is much improved & ready for dc today Taper prednisone over 2 weeks Brandi Chestnut V.

## 2011-10-26 ENCOUNTER — Encounter (HOSPITAL_COMMUNITY): Payer: Self-pay | Admitting: Pharmacy Technician

## 2011-10-27 ENCOUNTER — Other Ambulatory Visit: Payer: Self-pay | Admitting: Interventional Cardiology

## 2011-10-27 ENCOUNTER — Other Ambulatory Visit: Payer: Self-pay | Admitting: Cardiology

## 2011-10-27 NOTE — H&P (Signed)
HS/PER Brandi Cline/FOLLOWUP CP/POST HOSPITAL & pre cath work up.      HPI:     General:           Brandi Cline is a 78 yo female followed by Dr Katrinka Blazing with a hx of CAD (status post stent to LAD, 2002. last cardiac cath 1/11 with-- Mod. LAD with < 50% in RCA and Cfx. She also has hx of significant COPD followed by pulmonology. Pt was seen by Dr Brandi Cline on 09/15/11 and noted she is more chest heaviness over the last 2-3 months associated with increase SOB. She was hospitalized 10/05/11 after increasing SOB and productive cough, and she states she was told by she had CAP, and CXR with out NAD--increase COPD. She was treated with prednisone, antibiotics and home oxygen. She states the cough resolved and her breathing is stable now. She last saw me 10/01/11 due to having alot of chest tightness that feels like a knot in her chest and radiated at times into her right jaw. The jaw discomfort is mild and aching. She ask to be seen today due intermittent chest pain across her entire chest into her back along with right jaw aching that feels like chest pain she use to have with her heart and is different than on 10/01/11. Sunday she was awakened at 5 am with chest pain releaved by NTG X 1 then reoccurred at rest while at church and took ntg x3, xanax 1/2 tab and omeprazole before pain resolved. She has had 2 slight episodes yesterday but did not take NTG.         She denies palpitations, no problems with GERD at this time, no dizziness nor syncope, no PND.     ROS:      as noted in HPI, no headache, no neurological changes, no falls, numbness, nor visual changes. denies any allergy to IVP dye,.     Medical History: ASHD status post stent placement in LAD, 2002. Mod. LAD with < 505% in RCA and Cfx, 2011., Hypercholesterolemia, Chronic bronchitis, Crohn's disease status post 20 inches of terminal ileum and right colon removed in 93 with terminal ileal disease seen on colonoscopy in 2001, Left-sided diverticulosis, GERD, Muscle cramps  from hypokalemia, Intermittent vertigo, Chronic urinary tract infection, Mild chronic anxiety, Right lower rib cage pain, Kidney stones, Borderline glaucoma, Mildly abnormal liver function studies, Shingles involving left eye, with postherpetic neuralgia, low-back pain with right lower extremity sciatica, Fall, 2010, Allergic conjunctivitis, COPD, followed by Dr. Jetty Duhamel, sinusitis, January, 2011 [place] hospitalized for chest pain/dyspnea, Rectal stricture/narrowing.      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      Family History:  Father: deceased 33 yrs Prostate CA Mother: deceased 57 yrs multi-infarct dementia, hypertension and uremia Brother 1: deceased aneurysm Brother2: deceased cirrhosis of the liver, peptic ulcer disease, prostate cancer and AAA repair Sister 1: deceased COPD Sister 2: alive Chronic bronchitis Sister 3: alive Breast cancer  2 children alive and well.     Social History:      General: History of smoking  cigarettes:  Former smoker, Quit in year  1993, Pack-year Hx:  20. no Smoking, Quit in 1993 after 20 pack/years. no Alcohol. Caffeine: yes, 2 cups daily. Occupation: Retired Film/video editor. Marital Status: Married from 71 until she  was widowed in 2002.     [place] native.     Medications: Vitamin B 12 injection 1000ug liquid 1 ml monthly, Magnesium Oxide 400 MG Tablet 3 tablets daily, Centrum Silver Ultra Womens Tablet 1 tablet one daily, Tramadol HCl 50 MG Tablet 1 tablet as needed for pain Once a day, Acyclovir 800 MG Tablet 1 tablet Three times a day for 5 days as needed for fever blisters, Spiriva HandiHaler 18 MCG Capsule 1 capsule by mouth Once a day, Symbicort 160-4.5 MCG/ACT Aerosol 2 puffs Twice a day, Fish Oil 1200 MG m 2 tablets qd, Albuterol  Sulfate HFA 108 (90 Base) MCG/ACT Aerosol Solution 2 puffs as needed every 4 hrs, Promethazine HCl 12.5 MG Tablet 1 tablet as needed for nausea Three times a day, Estradiol 0.5 MG Tablet 1/2 tablet Once a day, Omeprazole 20 MG Capsule Delayed Release 1 capsule Once a day, Albuterol Sulfate (2.5 MG/3ML) 0.083% Nebulization Solution 3 ml as needed a sdirected, Amlodipine Besylate 10 MG Tablet 1 tablet Once a day, HCTZ 25 MG 25 MG Tablet 1 tablet q AM, Potassium Chloride 20 MEQ Tablet Extended Release one tablet Once a day, Losartan Potassium 50 MG Tablet 1 tablet Once a day, Isosorbide Mononitrate 30 MG Tablet Extended Release 24 Hour 2 tablet Once a day, Nitroglycerin 0.4 MG Tablet Sublingual 1 tablet under the tongue as needed, Aspirin EC 81 mg Tablet Delayed Release 1 tablet qd, Simvastatin 20 MG Tablet STOPPED 1 MONTH AGO MWF---followed by Dr Kevan Ny, Xanax 0.5 MG Tablet 1 tablet bid, Medication List reviewed and reconciled with the patient     Allergies: Codeine, Sulfa drugs, Altace, Lisinopril: itching and a cough, simvastatin: severe muscle aches at 20 mg, Avelox.      Objective:    Vitals: Wt 142.8, Wt change .8 lb, Ht 64, BMI 24.51, Pulse sitting 98, BP sitting 130/70.     Examination:     Cardiology Exam:         GENERAL APPEARANCE: NAD, currently pain free.  HEENT: normal.  CAROTID UPSTROKE: no bruit, upstrokes intact.  JVD: flat.  HEART: regular rate and rhythm, normal S1S2, no rub, no gallop, or click.  HEART MURMUR: 1/6 systolic murmur.  LUNGS: clear to auscultation, no wheezing/rhonchi/rales, bibasilar decrease breath sounds.  ABDOMEN: soft, non-tender,+ bowel sounds.  EXTREMITIES: trace edema in feet only.  PERIPHERAL PULSES: 2+, bilateral.  NEUROLOGIC: grossly intact, cranial nerves intact, gait WNL.  MOOD: normal.            Assessment:    Assessment:  1. Atherosclerotic heart disease native coronary artery w/angina pectoris - 414.01 (Primary)   2. Coronary atherosclerosis of native  coronary artery - 414.01   3. Essential hypertension, benign - 401.1   4. Chest pain - 786.50     Plan:    1. Coronary atherosclerosis of native coronary artery   Diagnostic Imaging:EKG NSR, Boehler,Brandi Cline 10/26/2011 12:35:07 PM > Adriel Desrosier A 10/26/2011 01:27:22 PM > unchanged compared to 10/01/11       2. Chest pain        LAB: CBC with Diff     WBC 8.6 4.0-11.0 - K/ul        RBC 4.44 4.20-5.40 - M/uL        HGB 13.1 12.0-16.0 - g/dL        HCT 16.1 09.6-04.5 - %        MCH 29.5 27.0-33.0 - pg        MPV 8.8 7.5-10.7 - fL  MCV 93.7 81.0-99.0 - fL         MCHC 31.5 32.0-36.0 - g/dL L       RDW 91.4 78.2-95.6 - %        NRBC# 0.00 -         PLT 138 150-400 - K/uL L        NEUT % 74.7 43.3-71.9 - % H       NRBC% 0.00 - %         LYMPH% 15.2 16.8-43.5 - % L       MONO % 8.2 4.6-12.4 - %        EOS % 1.2 0.0-7.8 - %        BASO % 0.7 0.0-1.0 - %        NEUT # 6.5 1.9-7.2 - K/uL        LYMPH# 1.30 1.10-2.70 - K/uL        MONO # 0.7 0.3-0.8 - K/uL        EOS # 0.1 0.0-0.6 - K/uL        BASO # 0.1 0.0-0.1 - K/uL               Sevanna Ballengee A 10/26/2011 01:27:47 PM > ok for cath        LAB: Comp Metabolic Panel      GLUCOSE 125 70-99 - mg/dL H       BUN 15 2-13 - mg/dL        CREATININE 0.86 0.60-1.30 - mg/dl        eGFR (NON-AFRICAN AMERICAN) 69 >60 - calc        eGFR (AFRICAN AMERICAN) 84 >60 - calc        SODIUM 139 136-145 - mmol/L        POTASSIUM 3.8 3.5-5.5 - mmol/L        CHLORIDE 102 98-107 - mmol/L        C02 28 22-32 - mg/dL        ANION GAP 57.8 4.6-96.2 - mmol/L        CALCIUM 9.5 8.6-10.3 - mg/dL        T PROTEIN 7.0 9.5-2.8 - g/dL        ALBUMIN 4.1 4.1-3.2 - g/dL         T.BILI 1.5 0.3-1.0 - mg/dL H        ALP 440 10-272 - U/L H        AST 52 0-39 - U/L H        ALT 64 0-52 - U/L H              Addalynne Golding A 10/26/2011 05:33:53 PM > for cath, cc to Dr Kevan Ny        LAB: PT and PTT ([id number])     aPTT 24 24-33 - SEC        INR 1.1 0.8-1.2  -        Prothrombin Time 11.0 9.1-12.0 - SEC               Jeancarlos Marchena A 10/26/2011 05:29:03 PM > ok   Dr Katrinka Blazing in with the recurrent chest pain that is changing and responding to her nTG we will proceed with cardiac cath. Risks and benefits of cardiac catheterization have been reviewed including risk of stroke, heart attack, death, bleeding, renal impariment and arterial damage. There was ample oppurtuny to answer questions. Alternatives were discussed. Patient understands and wishes to proceed.  Immunizations:       Labs:      Procedure Codes: 08657 ECL CBC PLATELET DIFF, 80053 ECL COMP METABOLIC PANEL, 93000 EKG I AND R, [id number] BLOOD COLLECTION ROUTINE VENIPUNCTURE     Preventive:           Follow Up: HS post cath (Reason: CAD, chest pain S/P cath)        Provider: Michaell Cowing. Emelda Fear, NP  Patient: Brandi Cline, Brandi Cline  DOB: 1930/08/25  Date: 10/26/2011

## 2011-10-28 ENCOUNTER — Ambulatory Visit (HOSPITAL_COMMUNITY)
Admission: RE | Admit: 2011-10-28 | Discharge: 2011-10-28 | Disposition: A | Discharge status: Home / Self Care | Payer: Medicare Other | Source: Ambulatory Visit | Attending: Interventional Cardiology | Admitting: Interventional Cardiology

## 2011-10-28 ENCOUNTER — Encounter (HOSPITAL_COMMUNITY)
Admission: RE | Disposition: A | Discharge status: Home / Self Care | Payer: Self-pay | Source: Ambulatory Visit | Attending: Interventional Cardiology

## 2011-10-28 DIAGNOSIS — T82897A Other specified complication of cardiac prosthetic devices, implants and grafts, initial encounter: Secondary | ICD-10-CM | POA: Insufficient documentation

## 2011-10-28 DIAGNOSIS — I2510 Atherosclerotic heart disease of native coronary artery without angina pectoris: Secondary | ICD-10-CM | POA: Insufficient documentation

## 2011-10-28 DIAGNOSIS — R079 Chest pain, unspecified: Secondary | ICD-10-CM | POA: Insufficient documentation

## 2011-10-28 DIAGNOSIS — Z9861 Coronary angioplasty status: Secondary | ICD-10-CM | POA: Insufficient documentation

## 2011-10-28 DIAGNOSIS — Y831 Surgical operation with implant of artificial internal device as the cause of abnormal reaction of the patient, or of later complication, without mention of misadventure at the time of the procedure: Secondary | ICD-10-CM | POA: Insufficient documentation

## 2011-10-28 DIAGNOSIS — M542 Cervicalgia: Secondary | ICD-10-CM | POA: Insufficient documentation

## 2011-10-28 HISTORY — PX: LEFT HEART CATHETERIZATION WITH CORONARY ANGIOGRAM: SHX5451

## 2011-10-28 SURGERY — LEFT HEART CATHETERIZATION WITH CORONARY ANGIOGRAM
Anesthesia: LOCAL

## 2011-10-28 MED ORDER — MIDAZOLAM HCL 2 MG/2ML IJ SOLN
INTRAMUSCULAR | Status: AC
  Filled 2011-10-28: qty 2

## 2011-10-28 MED ORDER — ACETAMINOPHEN 325 MG PO TABS: 650.0000 mg | ORAL_TABLET | ORAL | Status: DC | PRN

## 2011-10-28 MED ORDER — SODIUM CHLORIDE 0.9 % IV SOLN
INTRAVENOUS | Status: DC
  Administered 2011-10-28: 1000 mL via INTRAVENOUS

## 2011-10-28 MED ORDER — DIAZEPAM 5 MG PO TABS
5.0000 mg | ORAL_TABLET | ORAL | Status: AC
  Administered 2011-10-28: 5 mg via ORAL
  Filled 2011-10-28: qty 1

## 2011-10-28 MED ORDER — NITROGLYCERIN 0.2 MG/ML ON CALL CATH LAB
INTRAVENOUS | Status: AC
  Filled 2011-10-28: qty 1

## 2011-10-28 MED ORDER — ASPIRIN 81 MG PO CHEW
324.0000 mg | CHEWABLE_TABLET | ORAL | Status: AC
  Administered 2011-10-28: 324 mg via ORAL
  Filled 2011-10-28: qty 4

## 2011-10-28 MED ORDER — SODIUM CHLORIDE 0.9 % IV SOLN: INTRAVENOUS | Status: AC

## 2011-10-28 MED ORDER — SODIUM CHLORIDE 0.9 % IJ SOLN: 3.0000 mL | INTRAMUSCULAR | Status: DC | PRN

## 2011-10-28 MED ORDER — LIDOCAINE HCL (PF) 1 % IJ SOLN
INTRAMUSCULAR | Status: AC
  Filled 2011-10-28: qty 30

## 2011-10-28 MED ORDER — ONDANSETRON HCL 4 MG/2ML IJ SOLN: 4.0000 mg | Freq: Four times a day (QID) | INTRAMUSCULAR | Status: DC | PRN

## 2011-10-28 MED ORDER — HEPARIN (PORCINE) IN NACL 2-0.9 UNIT/ML-% IJ SOLN
INTRAMUSCULAR | Status: AC
  Filled 2011-10-28: qty 2000

## 2011-10-28 MED ORDER — FENTANYL CITRATE 0.05 MG/ML IJ SOLN
INTRAMUSCULAR | Status: AC
  Filled 2011-10-28: qty 2

## 2011-10-28 MED ORDER — OXYCODONE-ACETAMINOPHEN 5-325 MG PO TABS: 1.0000 | ORAL_TABLET | ORAL | Status: DC | PRN

## 2011-10-28 MED ORDER — SODIUM CHLORIDE 0.9 % IV SOLN: 250.0000 mL | INTRAVENOUS | Status: DC | PRN

## 2011-10-28 MED ORDER — SODIUM CHLORIDE 0.9 % IJ SOLN
3.0000 mL | Freq: Two times a day (BID) | INTRAMUSCULAR | Status: DC
Start: 2011-10-28 — End: 2011-10-28

## 2011-10-28 NOTE — H&P (Signed)
  She was previously scheduled to have coronary angiography in the early part of July after developing exertional dyspnea and chest discomfort. She was ultimately admitted to the hospital with a respiratory infection and dyspnea improved. Since discharge from the hospital however she is continued to experience chest and neck discomfort is responsive to nitroglycerin. She feels this is compatible with her prior angina. She has a history of drug-eluting stent implantation in the LAD and circumflex in 2002. Last catheterization 2 years ago revealed mild LAD in-stent restenosis. The studies being done to evaluate the possibility of progression of coronary disease accounting for the current symptoms. No significant change in symptoms have occurred since the office notes contained within the computer records in preparation for this procedure.  In the presence of the patient's family we discussed the procedure and risks of catheterization and PCI including stroke, death, MI, emergency bypass surgery, bleeding, among others. The patient accepted the procedure and is willing to proceed.

## 2011-10-28 NOTE — CV Procedure (Signed)
     Diagnostic Cardiac Catheterization Report  Brandi Cline  78 y.o.  female 1931/04/05  Procedure Date:10/28/2011 Referring Physician: Johnella Moloney M.D. Primary Cardiologist:: Gwynneth Albright, M.D.   PROCEDURE:  Left heart catheterization with selective coronary angiography, left ventriculogram.  INDICATIONS:  Recurring chest and neck discomfort responsive to nitroglycerin. History of CAD with prior LAD and circumflex drug-eluting stents in 2002. The studies indicated and is being performed to exclude progression of coronary disease and or in-stent restenosis the  The risks, benefits, and details of the procedure were explained to the patient.  The patient verbalized understanding and wanted to proceed.  Informed written consent was obtained.  PROCEDURE TECHNIQUE:  After Xylocaine anesthesia a 5 French sheath was placed in the right radial artery with a single anterior needle wall stick.   Coronary angiography was done using a 5 [place] A2 MP catheter.  Left ventriculography was done using a same catheter.    CONTRAST:  Total of 90 cc.  COMPLICATIONS:  None.    HEMODYNAMICS:  Aortic pressure was 130/60 mmHg; LV pressure was 137/15 mmHg; LVEDP 17 mm mercury.  There was no gradient between the left ventricle and aorta.    ANGIOGRAPHIC DATA:   The left main coronary artery is widely patent..  The left anterior descending artery is widely patent and contains mid vessel irregularities with mild in-stent restenosis. A large first diagonal previously jailed by the LAD stent contains ostial 80-90% obstruction. This is unchanged from prior imaging over the last 11 years to.  The left circumflex artery is mild irregularities within the mid circumflex stent. Less than 40% obstruction is noted in the proximal and mid vessel..  The right coronary artery is mid eccentric 30% narrowing. Otherwise widely patent.[name]  LEFT VENTRICULOGRAM:  Left ventricular angiogram was done in the 30 RAO  projection and revealed normal left ventricular wall motion and systolic function with an estimated ejection fraction of 60% %.    IMPRESSIONS:  1. Mild to moderate LAD in-stent restenosis. There is not significant progression since the last catheterization. There is 80-90% obstruction in the ostium of the first diagonal. This branches have this appearance since initial stenting in 2002.  2. 30% proximal and mid circumflex and 30-40% mid RCA stenoses.  3. Normal LV function   RECOMMENDATION:  Treat for the possibility of coronary artery spasm. Will increase the dose of long-acting nitrate therapy. No mechanical intervention seems necessary at this time.[name]

## 2011-11-03 ENCOUNTER — Encounter: Payer: Self-pay | Admitting: Internal Medicine

## 2011-12-06 ENCOUNTER — Encounter: Payer: Self-pay | Admitting: Internal Medicine

## 2011-12-06 ENCOUNTER — Ambulatory Visit (INDEPENDENT_AMBULATORY_CARE_PROVIDER_SITE_OTHER): Payer: Medicare Other | Admitting: Internal Medicine

## 2011-12-06 VITALS — BP 122/68 | HR 74 | Ht 65.0 in | Wt 145.8 lb

## 2011-12-06 DIAGNOSIS — J449 Chronic obstructive pulmonary disease, unspecified: Secondary | ICD-10-CM

## 2011-12-06 DIAGNOSIS — J4489 Other specified chronic obstructive pulmonary disease: Secondary | ICD-10-CM

## 2011-12-06 DIAGNOSIS — J189 Pneumonia, unspecified organism: Secondary | ICD-10-CM

## 2011-12-06 NOTE — Patient Instructions (Addendum)
Order- [facility]- evaluate for light portable O2 system, 2 L/M     Dx COPD  Use your aerochamber spacer tube on your Symbicort   Please call as needed  Sample Symbicort 160 and Spiriva if available

## 2011-12-06 NOTE — Progress Notes (Signed)
Subjective:    Patient ID: Brandi Cline, female    DOB: 12-Apr-1931, 78 y.o.   MRN: [id number] Acute visit- Dr Shelle Iron 09/28/11- The patient comes in today for an acute sick visit.  She has known significant COPD, and is usually followed by Dr. [name].  She gives a 2 to three-day history of increasing shortness of breath, and this culminated in severe shortness of breath this morning.  She is currently a little better after using her morning bronchodilators and her neb treatment.  She has no significant cough, mucus, or congestion.  She does feel a chest heaviness, especially on the left side.  She has a history of coronary disease with stents, but thinks that chest discomfort is different from her usual angina.  Surprisingly, her oxygen saturations today are excellent.  12/06/11 - 80 yoF followed for COPD, complicated by allergic rhinitis, GERD, Crohns Disease, CAD/ stents Patient states had pneumonia/ hosp/ heart cath( ok w/ no new stents) in June. States breathing is a little worse since last office visit. States wears oxygen on 2L as needed. c/o sob, wheezing, and chest tightness every now and then. Denies chest pain and cough. Occasional scant clear mucus. Denies chest pain. Notices dyspnea mainly with exertion, worse in humid weather. CXR 10/08/11-reviewed with her IMPRESSION:  COPD/chronic changes. No active disease.  Original Report Authenticated By: [name], M.D.   ROS-see HPI Constitutional:   No-   weight loss, night sweats, fevers, chills, fatigue, lassitude. HEENT:   No-  headaches, difficulty swallowing, tooth/dental problems, sore throat,       No-  sneezing, itching, ear ache, nasal congestion, post nasal drip,  CV:  No-   chest pain, orthopnea, PND, swelling in lower extremities, anasarca, dizziness, palpitations Resp: +  shortness of breath with exertion or at rest.              No-   productive cough,  No non-productive cough,  No- coughing up of blood.              No-    change in color of mucus.  No- wheezing.   Skin: No-   rash or lesions. GI:  No-   heartburn, indigestion, abdominal pain, nausea, vomiting,  GU:  MS:  No-   joint pain or swelling.   Neuro-     nothing unusual Psych:  No- change in mood or affect. No depression or anxiety.  No memory loss.  Objective:  OBJ- Physical Exam General- Alert, Oriented, Affect-appropriate, Distress- none acute Skin- rash-none, lesions- none, excoriation- none Lymphadenopathy- none Head- atraumatic            Eyes- Gross vision intact, PERRLA, conjunctivae and secretions clear            Ears- +Hearing aids            Nose- Clear, no-Septal dev, mucus, polyps, erosion, perforation             Throat- Mallampati II , mucosa clear , drainage- none, tonsils- atrophic Neck- flexible , trachea midline, no stridor , thyroid nl, carotid no bruit Chest - symmetrical excursion , unlabored           Heart/CV- RRR , no murmur , no gallop  , no rub, nl s1 s2                           - JVD- none , edema- none, stasis changes- none, varices-  none           Lung- + mild basilar crackles/decreased breath sounds, wheeze- none, cough- none , dullness-none, rub- none           Chest wall-  Abd-  Br/ Gen/ Rectal- Not done, not indicated Extrem- cyanosis- none, clubbing, none, atrophy- none, strength- nl Neuro- grossly intact to observation  Assessment & Plan:

## 2011-12-12 ENCOUNTER — Encounter: Payer: Self-pay | Admitting: Internal Medicine

## 2011-12-12 NOTE — Assessment & Plan Note (Signed)
She is near baseline now, using oxygen only occasionally and recognizing dyspnea with exertion mostly in humid weather. Her portable oxygen is too heavy and she would probably use a lighter device routinely. Plan-[facility] to assess for light portable oxygen system

## 2011-12-12 NOTE — Assessment & Plan Note (Signed)
Clinically resolved after acute episode this summer. We discussed when to update chest x-ray

## 2012-01-04 ENCOUNTER — Telehealth: Payer: Self-pay | Admitting: Internal Medicine

## 2012-01-04 MED ORDER — PREDNISONE 10 MG PO TABS: 10.0000 mg | ORAL_TABLET | Freq: Every day | ORAL | Status: DC

## 2012-01-04 NOTE — Telephone Encounter (Signed)
Per CY-okay to give Prednisone 10 mg #20 take 4 x 2 days, 3 x 2 days, 2 x 2 days, 1 x 2 days then stop no refills.  

## 2012-01-04 NOTE — Telephone Encounter (Signed)
  Pt c/o increased sob and chest tightness since yesterday afternoon. She says she cannot take a deep breath. She denies any cough or congestion, no wheezing, no fever. She is using all of her inhalers as prescribed. Pt was last seen 12/06/11 and  Pls advise. Allergies  Allergen Reactions  . Avelox (Moxifloxacin Hcl In Nacl) Shortness Of Breath, Swelling and Rash  . Codeine Other (See Comments)    Couldn't breath  . Milk-Related Compounds Other (See Comments)    Hurts stomach  . Lisinopril Rash

## 2012-01-04 NOTE — Telephone Encounter (Signed)
Called and spoke with patient, patient aware Rx will be sent [facility] as requested. Nothing further needed at this time.  Prednisone 10 mg #20 take 4 x 2 days, 3 x 2 days,2 x 2 days, 1 x 2 days then stop no refills.

## 2012-02-07 ENCOUNTER — Other Ambulatory Visit: Payer: Self-pay | Admitting: Internal Medicine

## 2012-02-07 DIAGNOSIS — Z1231 Encounter for screening mammogram for malignant neoplasm of breast: Secondary | ICD-10-CM

## 2012-03-13 ENCOUNTER — Ambulatory Visit
Admission: RE | Admit: 2012-03-13 | Discharge: 2012-03-13 | Disposition: A | Discharge status: Home / Self Care | Payer: Medicare Other | Source: Ambulatory Visit | Attending: Internal Medicine | Admitting: Internal Medicine

## 2012-03-13 DIAGNOSIS — Z1231 Encounter for screening mammogram for malignant neoplasm of breast: Secondary | ICD-10-CM

## 2012-03-17 ENCOUNTER — Ambulatory Visit: Payer: Medicare Other | Admitting: Internal Medicine

## 2012-03-17 ENCOUNTER — Ambulatory Visit (INDEPENDENT_AMBULATORY_CARE_PROVIDER_SITE_OTHER): Payer: Medicare Other | Admitting: Internal Medicine

## 2012-03-17 ENCOUNTER — Encounter: Payer: Self-pay | Admitting: Internal Medicine

## 2012-03-17 VITALS — BP 122/64 | HR 80 | Ht 65.0 in | Wt 147.2 lb

## 2012-03-17 DIAGNOSIS — J441 Chronic obstructive pulmonary disease with (acute) exacerbation: Secondary | ICD-10-CM

## 2012-03-17 DIAGNOSIS — I2510 Atherosclerotic heart disease of native coronary artery without angina pectoris: Secondary | ICD-10-CM

## 2012-03-17 NOTE — Patient Instructions (Addendum)
Sample Breo Ellipta    1 puff then rinse mouth, one time every day...    Try this instead of Symbicort  Sample Tudorza         1 puff, twice daily        Try this instead of Spiriva  When the samples run out, go back to your regular meds and compare.   Please call as needed

## 2012-03-17 NOTE — Progress Notes (Signed)
Subjective:    Patient ID: Brandi Cline, female    DOB: 1930/10/07, 78 y.o.   MRN: [id number] Acute visit- Dr Shelle Iron 09/28/11- The patient comes in today for an acute sick visit.  She has known significant COPD, and is usually followed by Dr. [name].  She gives a 2 to three-day history of increasing shortness of breath, and this culminated in severe shortness of breath this morning.  She is currently a little better after using her morning bronchodilators and her neb treatment.  She has no significant cough, mucus, or congestion.  She does feel a chest heaviness, especially on the left side.  She has a history of coronary disease with stents, but thinks that chest discomfort is different from her usual angina.  Surprisingly, her oxygen saturations today are excellent.  12/06/11 - 78 yoF followed for COPD, complicated by allergic rhinitis, GERD, Crohns Disease, CAD/ stents Patient states had pneumonia/ hosp/ heart cath( ok w/ no new stents) in June. States breathing is a little worse since last office visit. States wears oxygen on 2L as needed. c/o sob, wheezing, and chest tightness every now and then. Denies chest pain and cough. Occasional scant clear mucus. Denies chest pain. Notices dyspnea mainly with exertion, worse in humid weather. CXR 10/08/11-reviewed with her IMPRESSION:  COPD/chronic changes. No active disease.  Original Report Authenticated By: [name], M.D.   03/17/12- 78 yoF followed for COPD, complicated by allergic rhinitis, GERD, Crohns Disease, CAD/ stents FOLLOWS FOR: feels like symibcort and Spiriva not helping anymore-would like something different and cheaper Sister here 2 URI's since August treated by PCP. Needed prednisone.  Feels Symbicort wear off before 12 hour next dose. Wearing O2 more and using neb twice daily.  Little cough- scant clear mucus. Aware of angina despite isorbide. Feet swell- varies, not new or progressive. CXR 10/07/11- COPD  ROS-see  HPI Constitutional:   No-   weight loss, night sweats, fevers, chills, fatigue, lassitude. HEENT:   No-  headaches, difficulty swallowing, tooth/dental problems, sore throat,       No-  sneezing, itching, ear ache, nasal congestion, post nasal drip,  CV:  No-   chest pain, orthopnea, PND, +swelling in lower extremities, anasarca, dizziness, palpitations Resp: +  shortness of breath with exertion or at rest.              No-   productive cough,  No non-productive cough,  No- coughing up of blood.              No-   change in color of mucus.  No- wheezing.   Skin: No-   rash or lesions. GI:  No-   heartburn, indigestion, abdominal pain, nausea, vomiting,  GU:  MS:  No-   joint pain or swelling.   Neuro-     nothing unusual Psych:  No- change in mood or affect. No depression or anxiety.  No memory loss.  Objective:  OBJ- Physical Exam General- Alert, Oriented, Affect-appropriate, Distress- none acute Skin- rash-none, lesions- none, excoriation- none Lymphadenopathy- none Head- atraumatic            Eyes- Gross vision intact, PERRLA, conjunctivae and secretions clear            Ears- +Hearing aids            Nose- Clear, no-Septal dev, mucus, polyps, erosion, perforation             Throat- Mallampati II , mucosa clear , drainage- none, tonsils-  atrophic Neck- flexible , trachea midline, no stridor , thyroid nl, carotid no bruit Chest - symmetrical excursion , unlabored           Heart/CV- RRR , no murmur , no gallop  , no rub, nl s1 s2                           - JVD-?trace , edema- 2+, stasis changes- none, varices- none           Lung- decreased breath sounds, wheeze- none, cough- none , dullness-none, rub- none           Chest wall-  Abd-  Br/ Gen/ Rectal- Not done, not indicated Extrem- cyanosis- none, clubbing, none, atrophy- none, strength- nl Neuro- grossly intact to observation  Assessment & Plan:

## 2012-03-17 NOTE — Assessment & Plan Note (Signed)
Cost and duration of medication effectiveness are issues. Ankle edema suggests at least cor pulmonale. Last CXR in June w/o CHF and she was not anemic this summer. Stable angina raises concern for angina equivalent dyspnea, but not only exertional.  We will try extended duration Breo Ellipta sample to compare with Symbicort.

## 2012-03-17 NOTE — Assessment & Plan Note (Signed)
Describing stable non-exertional angina. Not new.

## 2012-04-27 ENCOUNTER — Ambulatory Visit (INDEPENDENT_AMBULATORY_CARE_PROVIDER_SITE_OTHER): Payer: Medicare Other | Admitting: Internal Medicine

## 2012-04-27 ENCOUNTER — Encounter: Payer: Self-pay | Admitting: Internal Medicine

## 2012-04-27 VITALS — BP 120/72 | HR 75 | Ht 65.0 in | Wt 146.6 lb

## 2012-04-27 DIAGNOSIS — J449 Chronic obstructive pulmonary disease, unspecified: Secondary | ICD-10-CM

## 2012-04-27 DIAGNOSIS — R0602 Shortness of breath: Secondary | ICD-10-CM

## 2012-04-27 MED ORDER — TIOTROPIUM BROMIDE MONOHYDRATE 18 MCG IN CAPS: 18.0000 ug | ORAL_CAPSULE | Freq: Every day | RESPIRATORY_TRACT | Status: DC

## 2012-04-27 MED ORDER — BUDESONIDE-FORMOTEROL FUMARATE 160-4.5 MCG/ACT IN AERO: 2.0000 | INHALATION_SPRAY | Freq: Two times a day (BID) | RESPIRATORY_TRACT | Status: DC

## 2012-04-27 NOTE — Progress Notes (Signed)
Subjective:    Patient ID: Brandi Cline, female    DOB: 1930-11-23, 79 y.o.   MRN: [id number] Acute visit- Dr Shelle Iron 09/28/11- The patient comes in today for an acute sick visit.  She has known significant COPD, and is usually followed by Dr. [name].  She gives a 2 to three-day history of increasing shortness of breath, and this culminated in severe shortness of breath this morning.  She is currently a little better after using her morning bronchodilators and her neb treatment.  She has no significant cough, mucus, or congestion.  She does feel a chest heaviness, especially on the left side.  She has a history of coronary disease with stents, but thinks that chest discomfort is different from her usual angina.  Surprisingly, her oxygen saturations today are excellent.  12/06/11 - 80 yoF followed for COPD, complicated by allergic rhinitis, GERD, Crohns Disease, CAD/ stents Patient states had pneumonia/ hosp/ heart cath( ok w/ no new stents) in June. States breathing is a little worse since last office visit. States wears oxygen on 2L as needed. c/o sob, wheezing, and chest tightness every now and then. Denies chest pain and cough. Occasional scant clear mucus. Denies chest pain. Notices dyspnea mainly with exertion, worse in humid weather. CXR 10/08/11-reviewed with her IMPRESSION:  COPD/chronic changes. No active disease.  Original Report Authenticated By: [name], M.D.   03/17/12- 80 yoF followed for COPD, complicated by allergic rhinitis, GERD, Crohns Disease, CAD/ stents FOLLOWS FOR: feels like symibcort and Spiriva not helping anymore-would like something different and cheaper Sister here 2 URI's since August treated by PCP. Needed prednisone.  Feels Symbicort wear off before 12 hour next dose. Wearing O2 more and using neb twice daily.  Little cough- scant clear mucus. Aware of angina despite isorbide. Feet swell- varies, not new or progressive. CXR 10/07/11- COPD  04/27/12-  81 yoF  followed for COPD, complicated by allergic rhinitis, GERD, Crohns Disease, CAD/ stents FOLLOWS FOR: Increased SOB today-unsure of cause(used nebulizer before OV-not much  difference), did not hear any wheezing. Denies any cough or chest congestion Often feels tight at night. Aware of increased fluid retention occasionally. Denies chest pain, infection, wheeze or cough. Last visit we gave sample Breo ellipta, which she blames for fluid retention. Continues oxygen 2 L/Advanced for sleep and as needed  ROS-see HPI Constitutional:   No-   weight loss, night sweats, fevers, chills, fatigue, lassitude. HEENT:   No-  headaches, difficulty swallowing, tooth/dental problems, sore throat,       No-  sneezing, itching, ear ache, nasal congestion, post nasal drip,  CV:  No-   chest pain, orthopnea, PND, +swelling in lower extremities, anasarca, dizziness, palpitations Resp: +  shortness of breath with exertion or at rest.              No-   productive cough,  No non-productive cough,  No- coughing up of blood.              No-   change in color of mucus.  No- wheezing.   Skin: No-   rash or lesions. GI:  No-   heartburn, indigestion, abdominal pain, nausea, vomiting,  GU:  MS:  No-   joint pain or swelling.   Neuro-     nothing unusual Psych:  No- change in mood or affect. No depression or anxiety.  No memory loss.  Objective:  OBJ- Physical Exam General- Alert, Oriented, Affect-appropriate, Distress- none acute Skin- rash-none, lesions- none,  excoriation- none Lymphadenopathy- none Head- atraumatic            Eyes- Gross vision intact, PERRLA, conjunctivae and secretions clear            Ears- +Hearing aids            Nose- Clear, no-Septal dev, mucus, polyps, erosion, perforation             Throat- Mallampati II , mucosa clear , drainage- none, tonsils- atrophic Neck- flexible , trachea midline, no stridor , thyroid nl, carotid no bruit Chest - symmetrical excursion , unlabored            Heart/CV- RRR , no murmur , no gallop  , no rub, nl s1 s2                           - JVD-+ full, edema- 1+, stasis changes- none, varices- none           Lung- decreased breath sounds, wheeze- none, cough- none , dullness-none, rub- none           Chest wall-  Abd-  Br/ Gen/ Rectal- Not done, not indicated Extrem- cyanosis- none, clubbing, none, atrophy- none, strength- nl Neuro- grossly intact to observation  Assessment & Plan:

## 2012-04-27 NOTE — Patient Instructions (Addendum)
Try taking an extra HCTZ diuretic once today when you get home. See if that makes any difference in the next day in your shortness of breath.  Please call as needed  Sample       Spiriva  script sent  Sample        script for Symbicort sent

## 2012-05-10 NOTE — Assessment & Plan Note (Signed)
Full neck veins and mild ankle edema suggest an important part of her dyspnea is fluid retention. We will have her increase her HCTZ diuretic

## 2012-05-10 NOTE — Assessment & Plan Note (Signed)
Known lung disease. The question is whether the recent worsening is heart failure or pulmonary Plan-gentle increase in diuresis.

## 2012-05-13 ENCOUNTER — Emergency Department (HOSPITAL_COMMUNITY): Payer: Medicare Other

## 2012-05-13 ENCOUNTER — Inpatient Hospital Stay (HOSPITAL_COMMUNITY): Payer: Medicare Other

## 2012-05-13 ENCOUNTER — Encounter (HOSPITAL_COMMUNITY): Payer: Self-pay | Admitting: Emergency Medicine

## 2012-05-13 ENCOUNTER — Inpatient Hospital Stay (HOSPITAL_COMMUNITY)
Admission: EM | Admit: 2012-05-13 | Discharge: 2012-05-15 | DRG: 192 | Disposition: A | Discharge status: Home / Self Care | Payer: Medicare Other | Attending: Internal Medicine | Admitting: Internal Medicine

## 2012-05-13 DIAGNOSIS — I2510 Atherosclerotic heart disease of native coronary artery without angina pectoris: Secondary | ICD-10-CM | POA: Diagnosis present

## 2012-05-13 DIAGNOSIS — Z9981 Dependence on supplemental oxygen: Secondary | ICD-10-CM

## 2012-05-13 DIAGNOSIS — Z9861 Coronary angioplasty status: Secondary | ICD-10-CM

## 2012-05-13 DIAGNOSIS — I10 Essential (primary) hypertension: Secondary | ICD-10-CM | POA: Diagnosis present

## 2012-05-13 DIAGNOSIS — K219 Gastro-esophageal reflux disease without esophagitis: Secondary | ICD-10-CM | POA: Diagnosis present

## 2012-05-13 DIAGNOSIS — Z79899 Other long term (current) drug therapy: Secondary | ICD-10-CM

## 2012-05-13 DIAGNOSIS — Z87891 Personal history of nicotine dependence: Secondary | ICD-10-CM

## 2012-05-13 DIAGNOSIS — J449 Chronic obstructive pulmonary disease, unspecified: Secondary | ICD-10-CM

## 2012-05-13 DIAGNOSIS — G500 Trigeminal neuralgia: Secondary | ICD-10-CM | POA: Diagnosis present

## 2012-05-13 DIAGNOSIS — Z87442 Personal history of urinary calculi: Secondary | ICD-10-CM

## 2012-05-13 DIAGNOSIS — J9600 Acute respiratory failure, unspecified whether with hypoxia or hypercapnia: Secondary | ICD-10-CM

## 2012-05-13 DIAGNOSIS — J441 Chronic obstructive pulmonary disease with (acute) exacerbation: Principal | ICD-10-CM | POA: Diagnosis present

## 2012-05-13 DIAGNOSIS — J309 Allergic rhinitis, unspecified: Secondary | ICD-10-CM | POA: Diagnosis present

## 2012-05-13 DIAGNOSIS — J9611 Chronic respiratory failure with hypoxia: Secondary | ICD-10-CM | POA: Diagnosis present

## 2012-05-13 DIAGNOSIS — J9601 Acute respiratory failure with hypoxia: Secondary | ICD-10-CM

## 2012-05-13 LAB — COMPREHENSIVE METABOLIC PANEL
ALT: 70 U/L — ABNORMAL HIGH (ref 0–35)
AST: 75 U/L — ABNORMAL HIGH (ref 0–37)
Albumin: 4.1 g/dL (ref 3.5–5.2)
Alkaline Phosphatase: 129 U/L — ABNORMAL HIGH (ref 39–117)
Chloride: 98 mEq/L (ref 96–112)
Potassium: 3.8 mEq/L (ref 3.5–5.1)
Sodium: 131 mEq/L — ABNORMAL LOW (ref 135–145)
Total Bilirubin: 2.1 mg/dL — ABNORMAL HIGH (ref 0.3–1.2)
Total Protein: 7.2 g/dL (ref 6.0–8.3)

## 2012-05-13 LAB — TROPONIN I: Troponin I: <0.3 ng/mL (ref <0.30)

## 2012-05-13 LAB — CBC
HCT: 40.8 % (ref 36.0–46.0)
Hemoglobin: 14.3 g/dL (ref 12.0–15.0)
MCH: 31.5 pg (ref 26.0–34.0)
MCHC: 35.5 g/dL (ref 30.0–36.0)
MCHC: 35 g/dL (ref 30.0–36.0)
MCV: 89.9 fL (ref 78.0–100.0)
Platelets: 153 10*3/uL (ref 150–400)
RDW: 13.7 % (ref 11.5–15.5)
WBC: 9.7 10*3/uL (ref 4.0–10.5)

## 2012-05-13 LAB — CREATININE, SERUM: Creatinine, Ser: 0.75 mg/dL (ref 0.50–1.10)

## 2012-05-13 MED ORDER — PANTOPRAZOLE SODIUM 40 MG PO TBEC
40.0000 mg | DELAYED_RELEASE_TABLET | Freq: Every day | ORAL | Status: DC
  Administered 2012-05-13 – 2012-05-14 (×2): 40 mg via ORAL
  Filled 2012-05-13 (×2): qty 1

## 2012-05-13 MED ORDER — OMEGA-3 FATTY ACIDS 1000 MG PO CAPS
2.0000 g | ORAL_CAPSULE | Freq: Two times a day (BID) | ORAL | Status: DC
  Administered 2012-05-13: 2 g via ORAL
  Filled 2012-05-13 (×2): qty 2

## 2012-05-13 MED ORDER — ALPRAZOLAM 0.25 MG PO TABS
0.2500 mg | ORAL_TABLET | Freq: Two times a day (BID) | ORAL | Status: DC | PRN
  Administered 2012-05-13 – 2012-05-14 (×2): 0.25 mg via ORAL
  Filled 2012-05-13 (×2): qty 1

## 2012-05-13 MED ORDER — ENOXAPARIN SODIUM 40 MG/0.4ML ~~LOC~~ SOLN
40.0000 mg | SUBCUTANEOUS | Status: DC
  Administered 2012-05-13 – 2012-05-14 (×2): 40 mg via SUBCUTANEOUS
  Filled 2012-05-13 (×3): qty 0.4

## 2012-05-13 MED ORDER — POTASSIUM CHLORIDE CRYS ER 20 MEQ PO TBCR
EXTENDED_RELEASE_TABLET | ORAL | Status: AC
  Filled 2012-05-13: qty 1

## 2012-05-13 MED ORDER — POTASSIUM CHLORIDE CRYS ER 20 MEQ PO TBCR
20.0000 meq | EXTENDED_RELEASE_TABLET | Freq: Every day | ORAL | Status: DC
  Administered 2012-05-13 – 2012-05-15 (×3): 20 meq via ORAL
  Filled 2012-05-13 (×2): qty 1

## 2012-05-13 MED ORDER — SODIUM CHLORIDE 0.9 % IV SOLN: 250.0000 mL | INTRAVENOUS | Status: DC | PRN

## 2012-05-13 MED ORDER — IPRATROPIUM BROMIDE 0.02 % IN SOLN: 0.5000 mg | Freq: Once | RESPIRATORY_TRACT | Status: DC

## 2012-05-13 MED ORDER — IOHEXOL 350 MG/ML SOLN
80.0000 mL | Freq: Once | INTRAVENOUS | Status: AC | PRN
  Administered 2012-05-13: 80 mL via INTRAVENOUS

## 2012-05-13 MED ORDER — ACETAMINOPHEN 650 MG RE SUPP: 650.0000 mg | Freq: Four times a day (QID) | RECTAL | Status: DC | PRN

## 2012-05-13 MED ORDER — ALBUTEROL SULFATE (5 MG/ML) 0.5% IN NEBU: 5.0000 mg | INHALATION_SOLUTION | RESPIRATORY_TRACT | Status: DC | PRN

## 2012-05-13 MED ORDER — IPRATROPIUM BROMIDE 0.02 % IN SOLN
0.5000 mg | Freq: Once | RESPIRATORY_TRACT | Status: AC
  Administered 2012-05-13: 0.5 mg via RESPIRATORY_TRACT
  Filled 2012-05-13: qty 2.5

## 2012-05-13 MED ORDER — ALBUTEROL SULFATE (5 MG/ML) 0.5% IN NEBU: 5.0000 mg | INHALATION_SOLUTION | Freq: Once | RESPIRATORY_TRACT | Status: DC

## 2012-05-13 MED ORDER — ASPIRIN 81 MG PO TABS
81.0000 mg | ORAL_TABLET | Freq: Every day | ORAL | Status: DC
  Administered 2012-05-13: 81 mg via ORAL
  Filled 2012-05-13 (×2): qty 1

## 2012-05-13 MED ORDER — SENNOSIDES-DOCUSATE SODIUM 8.6-50 MG PO TABS
1.0000 | ORAL_TABLET | Freq: Every evening | ORAL | Status: DC | PRN
  Filled 2012-05-13: qty 1

## 2012-05-13 MED ORDER — SODIUM CHLORIDE 0.9 % IJ SOLN: 3.0000 mL | INTRAMUSCULAR | Status: DC | PRN

## 2012-05-13 MED ORDER — ACETAMINOPHEN 325 MG PO TABS
650.0000 mg | ORAL_TABLET | Freq: Four times a day (QID) | ORAL | Status: DC | PRN
  Administered 2012-05-14: 650 mg via ORAL
  Filled 2012-05-13: qty 2

## 2012-05-13 MED ORDER — SODIUM CHLORIDE 0.9 % IJ SOLN: 3.0000 mL | Freq: Two times a day (BID) | INTRAMUSCULAR | Status: DC

## 2012-05-13 MED ORDER — OMEGA-3-ACID ETHYL ESTERS 1 G PO CAPS
2.0000 g | ORAL_CAPSULE | Freq: Two times a day (BID) | ORAL | Status: DC
  Administered 2012-05-14 – 2012-05-15 (×3): 2 g via ORAL
  Filled 2012-05-13 (×5): qty 2

## 2012-05-13 MED ORDER — ALBUTEROL SULFATE (5 MG/ML) 0.5% IN NEBU
5.0000 mg | INHALATION_SOLUTION | Freq: Once | RESPIRATORY_TRACT | Status: AC
  Administered 2012-05-13: 5 mg via RESPIRATORY_TRACT
  Filled 2012-05-13: qty 1

## 2012-05-13 MED ORDER — ONDANSETRON HCL 4 MG/2ML IJ SOLN: 4.0000 mg | Freq: Four times a day (QID) | INTRAMUSCULAR | Status: DC | PRN

## 2012-05-13 MED ORDER — ALBUTEROL SULFATE (5 MG/ML) 0.5% IN NEBU: 2.5000 mg | INHALATION_SOLUTION | RESPIRATORY_TRACT | Status: DC | PRN

## 2012-05-13 MED ORDER — IPRATROPIUM BROMIDE 0.02 % IN SOLN
0.5000 mg | Freq: Four times a day (QID) | RESPIRATORY_TRACT | Status: DC
  Administered 2012-05-13 – 2012-05-15 (×8): 0.5 mg via RESPIRATORY_TRACT
  Filled 2012-05-13 (×9): qty 2.5

## 2012-05-13 MED ORDER — ISOSORBIDE MONONITRATE ER 60 MG PO TB24
60.0000 mg | ORAL_TABLET | Freq: Every day | ORAL | Status: DC
  Administered 2012-05-13 – 2012-05-15 (×3): 60 mg via ORAL
  Filled 2012-05-13 (×3): qty 1

## 2012-05-13 MED ORDER — LOSARTAN POTASSIUM 25 MG PO TABS
25.0000 mg | ORAL_TABLET | Freq: Every day | ORAL | Status: DC
  Administered 2012-05-13 – 2012-05-15 (×3): 25 mg via ORAL
  Filled 2012-05-13 (×3): qty 1

## 2012-05-13 MED ORDER — ONE-DAILY MULTI VITAMINS PO TABS
1.0000 | ORAL_TABLET | Freq: Every day | ORAL | Status: DC
  Administered 2012-05-13 – 2012-05-15 (×3): 1 via ORAL
  Filled 2012-05-13 (×3): qty 1

## 2012-05-13 MED ORDER — SODIUM CHLORIDE 0.9 % IV SOLN
INTRAVENOUS | Status: DC
  Administered 2012-05-13: 20 mL/h via INTRAVENOUS

## 2012-05-13 MED ORDER — SODIUM CHLORIDE 0.9 % IJ SOLN
3.0000 mL | Freq: Two times a day (BID) | INTRAMUSCULAR | Status: DC
  Administered 2012-05-13 – 2012-05-15 (×4): 3 mL via INTRAVENOUS

## 2012-05-13 MED ORDER — BUDESONIDE-FORMOTEROL FUMARATE 160-4.5 MCG/ACT IN AERO
2.0000 | INHALATION_SPRAY | Freq: Two times a day (BID) | RESPIRATORY_TRACT | Status: DC
  Administered 2012-05-13 – 2012-05-15 (×4): 2 via RESPIRATORY_TRACT
  Filled 2012-05-13: qty 6

## 2012-05-13 MED ORDER — ALBUTEROL SULFATE (5 MG/ML) 0.5% IN NEBU
2.5000 mg | INHALATION_SOLUTION | Freq: Once | RESPIRATORY_TRACT | Status: AC
  Administered 2012-05-13: 5 mg via RESPIRATORY_TRACT
  Filled 2012-05-13: qty 1

## 2012-05-13 MED ORDER — OXYCODONE HCL 5 MG PO TABS: 5.0000 mg | ORAL_TABLET | ORAL | Status: DC | PRN

## 2012-05-13 MED ORDER — AMLODIPINE BESYLATE 5 MG PO TABS
5.0000 mg | ORAL_TABLET | Freq: Every day | ORAL | Status: DC
  Administered 2012-05-13 – 2012-05-15 (×3): 5 mg via ORAL
  Filled 2012-05-13 (×3): qty 1

## 2012-05-13 MED ORDER — METHYLPREDNISOLONE SODIUM SUCC 125 MG IJ SOLR
80.0000 mg | Freq: Three times a day (TID) | INTRAMUSCULAR | Status: DC
  Administered 2012-05-13 – 2012-05-14 (×3): 80 mg via INTRAVENOUS
  Filled 2012-05-13 (×6): qty 1.28

## 2012-05-13 MED ORDER — METHYLPREDNISOLONE SODIUM SUCC 125 MG IJ SOLR
125.0000 mg | Freq: Once | INTRAMUSCULAR | Status: AC
  Administered 2012-05-13: 125 mg via INTRAVENOUS
  Filled 2012-05-13: qty 2

## 2012-05-13 MED ORDER — NITROGLYCERIN 0.4 MG SL SUBL: 0.4000 mg | SUBLINGUAL_TABLET | SUBLINGUAL | Status: DC | PRN

## 2012-05-13 MED ORDER — ONDANSETRON HCL 4 MG PO TABS: 4.0000 mg | ORAL_TABLET | Freq: Four times a day (QID) | ORAL | Status: DC | PRN

## 2012-05-13 MED ORDER — ALBUTEROL SULFATE (5 MG/ML) 0.5% IN NEBU
2.5000 mg | INHALATION_SOLUTION | Freq: Four times a day (QID) | RESPIRATORY_TRACT | Status: DC
  Administered 2012-05-13 – 2012-05-15 (×8): 2.5 mg via RESPIRATORY_TRACT
  Filled 2012-05-13 (×9): qty 0.5

## 2012-05-13 NOTE — ED Notes (Signed)
States took 1/2 xanax this morning.

## 2012-05-13 NOTE — ED Notes (Signed)
C/o increased shortness of breath since Tuesday=had a cortisone injection on Monday in right shoulder and left elbow. States shortness of breath worse since then,

## 2012-05-13 NOTE — ED Notes (Signed)
Pt given meal

## 2012-05-13 NOTE — H&P (Addendum)
[facility]          History and Physical    PCP:   GATES,ROBERT NEVILL, MD   Chief Complaint:  SOB  HPI: Pleasant 79 y/o woman with a h/o oxygen dependant COPD, CAD s/p PTCA and HTN, presents to the hospital with a 3 day history of progressively worsening SOB. She has been using her albuterol every 3 hours at least for the past 24 hours. She gest very short-winded even going to the bathroom. She denies fever, chills, sick contacts, cough. She does have pleuritic CP that worsens with deep inspiration over her right rib cage. She has been given a few nebs in the ED and is still SOB and we have been asked to admit her for further evaluation and management.  Allergies:   Allergies  Allergen Reactions  . Avelox (Moxifloxacin Hcl In Nacl) Shortness Of Breath, Swelling and Rash  . Codeine Other (See Comments)    Couldn't breath  . Milk-Related Compounds Other (See Comments)    Hurts stomach..   . Lisinopril Rash      Past Medical History  Diagnosis Date  . Dyspnea   . Esophageal reflux   . CAD (coronary artery disease)   . Hypertension   . Crohn's disease   . Allergic rhinitis   . COPD (chronic obstructive pulmonary disease)   . History of kidney stones   . History of shingles   . Trigeminal neuralgia     Past Surgical History  Procedure Date  . Coronary stents   . Lithotripsy   . Ureteral stent placement   . Cholecystectomy   . Total abdominal hysterectomy   . Partial resections large and small bowel for crohn's   . Breast lumps benign     Prior to Admission medications   Medication Sig Start Date End Date Taking? Authorizing Provider  albuterol (PROAIR HFA) 108 (90 BASE) MCG/ACT inhaler Inhale 2 puffs into the lungs every 4 (four) hours as needed. Shortness of breath   Yes Historical Provider, MD  albuterol (PROVENTIL) (2.5 MG/3ML) 0.083% nebulizer solution Take 2.5 mg by nebulization every 4 (four) hours as needed. Shortness of breath 07/16/11  07/15/12 Yes Tammy S Parrett, NP  ALPRAZolam (XANAX) 0.5 MG tablet Take 0.25 mg by mouth 2 (two) times daily as needed. For anxiety   Yes Historical Provider, MD  amLODipine (NORVASC) 10 MG tablet Take 5 mg by mouth daily.    Yes Historical Provider, MD  aspirin 81 MG tablet Take 81 mg by mouth daily.   Yes Historical Provider, MD  azithromycin (ZITHROMAX) 500 MG tablet Take 500 mg by mouth daily. 4 day course completed yesterday   Yes Historical Provider, MD  budesonide-formoterol (SYMBICORT) 160-4.5 MCG/ACT inhaler Inhale 2 puffs into the lungs 2 (two) times daily. 04/27/12  Yes Waymon Budge, MD  estradiol (ESTRACE) 0.5 MG tablet Take 0.25 mg by mouth daily.    Yes Historical Provider, MD  fish oil-omega-3 fatty acids 1000 MG capsule Take 2 g by mouth 2 (two) times daily.   Yes Historical Provider, MD  hydrochlorothiazide (HYDRODIURIL) 25 MG tablet Take 25 mg by mouth daily.   Yes Historical Provider, MD  isosorbide mononitrate (IMDUR) 30 MG 24 hr tablet Take 60 mg by mouth daily.    Yes Historical Provider, MD  losartan (COZAAR) 25 MG tablet Take 25 mg by mouth daily.    Yes Historical Provider, MD  Magnesium 250 MG TABS Take 2 tablets by mouth 3 (three) times daily.  Yes Historical Provider, MD  Multiple Vitamin (MULTIVITAMIN) tablet Take 1 tablet by mouth daily.    Yes Historical Provider, MD  nitroGLYCERIN (NITROSTAT) 0.4 MG SL tablet Place 0.4 mg under the tongue every 5 (five) minutes as needed. Chest pain   Yes Historical Provider, MD  omeprazole (PRILOSEC) 20 MG capsule Take 20 mg by mouth daily.    Yes Historical Provider, MD  potassium chloride SA (K-DUR,KLOR-CON) 20 MEQ tablet Take 20 mEq by mouth daily.    Yes Historical Provider, MD  tiotropium (SPIRIVA) 18 MCG inhalation capsule Place 1 capsule (18 mcg total) into inhaler and inhale daily. 04/27/12 04/27/13 Yes Clinton D Young, MD  VITAMIN D, ERGOCALCIFEROL, PO Take 1 tablet by mouth daily.    Yes Historical Provider, MD    Social  History:  reports that she quit smoking about 22 years ago. Her smoking use included Cigarettes. She has a 30 pack-year smoking history. She does not have any smokeless tobacco history on file. She reports that she does not drink alcohol or use illicit drugs.  Family History  Problem Relation Age of Onset  . Breast cancer Sister   . COPD Sister   . Stroke Mother   . Prostate cancer Father   . Prostate cancer Brother   . Heart attack Brother     Review of Systems:  Constitutional: Denies fever, chills, diaphoresis, appetite change and fatigue.  HEENT: Denies photophobia, eye pain, redness, hearing loss, ear pain, congestion, sore throat, rhinorrhea, sneezing, mouth sores, trouble swallowing, neck pain, neck stiffness and tinnitus.   Respiratory: Denies  Cough. Cardiovascular: Denies palpitations and leg swelling.  Gastrointestinal: Denies nausea, vomiting, abdominal pain, diarrhea, constipation, blood in stool and abdominal distention.  Genitourinary: Denies dysuria, urgency, frequency, hematuria, flank pain and difficulty urinating.  Musculoskeletal: Denies myalgias, back pain, joint swelling, arthralgias and gait problem.  Skin: Denies pallor, rash and wound.  Neurological: Denies dizziness, seizures, syncope, weakness, light-headedness, numbness and headaches.  Hematological: Denies adenopathy. Easy bruising, personal or family bleeding history  Psychiatric/Behavioral: Denies suicidal ideation, mood changes, confusion, nervousness, sleep disturbance and agitation   Physical Exam: Blood pressure 150/44, pulse 72, temperature 98.1 F (36.7 C), temperature source Oral, resp. rate 18, SpO2 96.00%. Gen: AA Ox3, having difficulty speaking in full sentences. HEENT: [place]/AT/PERRL/EOMI Neck: supple, no JVD, no LAD, no bruits, no goiter. CV:RRR, no M/R/G Lungs: no wheezes, poor air movement. Abd: S/NT/ND/+BS/no masses or organomegaly noted. Ext: no C/C/E, +pedal pulses. Neuro: grossly  intact and non-focal.  Labs on Admission:  Results for orders placed during the hospital encounter of 05/13/12 (from the past 48 hour(s))  CBC     Status: Normal   Collection Time   05/13/12 10:46 AM      Component Value Range Comment   WBC 9.7  4.0 - 10.5 K/uL    RBC 4.54  3.87 - 5.11 MIL/uL    Hemoglobin 14.5  12.0 - 15.0 g/dL    HCT 16.1  09.6 - 04.5 %    MCV 89.9  78.0 - 100.0 fL    MCH 31.9  26.0 - 34.0 pg    MCHC 35.5  30.0 - 36.0 g/dL    RDW 40.9  81.1 - 91.4 %    Platelets 153  150 - 400 K/uL   COMPREHENSIVE METABOLIC PANEL     Status: Abnormal   Collection Time   05/13/12 10:46 AM      Component Value Range Comment   Sodium 131 (*) 135 -  145 mEq/L    Potassium 3.8  3.5 - 5.1 mEq/L    Chloride 98  96 - 112 mEq/L    CO2 19  19 - 32 mEq/L    Glucose, Bld 93  70 - 99 mg/dL    BUN 25 (*) 6 - 23 mg/dL    Creatinine, Ser 1.61  0.50 - 1.10 mg/dL    Calcium 9.7  8.4 - 09.6 mg/dL    Total Protein 7.2  6.0 - 8.3 g/dL    Albumin 4.1  3.5 - 5.2 g/dL    AST 75 (*) 0 - 37 U/L    ALT 70 (*) 0 - 35 U/L    Alkaline Phosphatase 129 (*) 39 - 117 U/L    Total Bilirubin 2.1 (*) 0.3 - 1.2 mg/dL    GFR calc non Af Amer 80 (*) >90 mL/min    GFR calc Af Amer >90  >90 mL/min   TROPONIN I     Status: Normal   Collection Time   05/13/12 10:47 AM      Component Value Range Comment   Troponin I <0.30  <0.30 ng/mL   PRO B NATRIURETIC PEPTIDE     Status: Normal   Collection Time   05/13/12 10:47 AM      Component Value Range Comment   Pro B Natriuretic peptide (BNP) 264.5  0 - 450 pg/mL     Radiological Exams on Admission: Dg Chest Portable 1 View  05/13/2012  *RADIOLOGY REPORT*  Clinical Data: Short of breath  PORTABLE CHEST - 1 VIEW  Comparison: Prior chest x-ray 10/08/2011  Findings: Increased linear atelectasis versus scarring in the left base.  Otherwise, stable background changes of hyperexpansion chronic bronchitic changes and emphysema.  Cardiac and mediastinal contours remain within normal  limits.  No pneumothorax or pleural effusion.  Aortic atherosclerosis again noted.  IMPRESSION:  1.  Left basilar atelectasis versus scarring 2.  Stable background changes of COPD/emphysema   Original Report Authenticated By: Malachy Moan, M.D.     Assessment/Plan Principal Problem:  *Acute respiratory failure with hypoxia Active Problems:  HYPERTENSION  CAD  COPD with acute exacerbation   Acute Hypoxic Respiratory Failure -CXR without evidence for PNA or edema. -I think it is important to rule out PE, as she is not wheezing at present, making a COPD Exacerbation less likely. -Will check CT Angio Chest. -IV steroids, frequent nebs. -Will also rule out ACS by cycling cardiac enzymes. -Note that first trop in the ED is WNL and an EKG shows no acute abnormality.  DVT Prophylaxis -Lovenox.  Code Status -Full code. -Discussed with patient's son and daughter at bedside.  She will be admitted to the service of Dr. Kevan Ny. Please call [facility] with questions until tomorrow am.    Time Spent on Admission: 75 minutes  HERNANDEZ ACOSTA,ESTELA [facility] Pager: [phone] 05/13/2012, 1:36 PM

## 2012-05-13 NOTE — ED Provider Notes (Signed)
History     CSN: [id number]  Arrival date & time 05/13/12  1002   First MD Initiated Contact with Patient 05/13/12 1025      Chief Complaint  Patient presents with  . Shortness of Breath    (Consider location/radiation/quality/duration/timing/severity/associated sxs/prior treatment) Patient is a 79 y.o. female presenting with shortness of breath. The history is provided by the patient and a relative.  Shortness of Breath  Associated symptoms include shortness of breath. Pertinent negatives include no fever and no cough.  pt with hx cad/stent, copd on home o2, c/o progressive dyspnea x 3-4 days. Worse w activity/exertion. Denies cough, nasal congestion, fever or other uri/flu symptoms. Feels tight in chest, constant, but no episodic chest pain. No pleuritic pain. No orthopnea or pnd. No leg pain or swelling. Similar symptoms w copd in past. Denies recent change in meds x received cortisone injection in shoulder for rotator cuff injury earlier this week. Compliant w normal meds. Had not used any albuterol at home yet today.     Past Medical History  Diagnosis Date  . Dyspnea   . Esophageal reflux   . CAD (coronary artery disease)   . Hypertension   . Crohn's disease   . Allergic rhinitis   . COPD (chronic obstructive pulmonary disease)   . History of kidney stones   . History of shingles   . Trigeminal neuralgia     Past Surgical History  Procedure Date  . Coronary stents   . Lithotripsy   . Ureteral stent placement   . Cholecystectomy   . Total abdominal hysterectomy   . Partial resections large and small bowel for crohn's   . Breast lumps benign     Family History  Problem Relation Age of Onset  . Breast cancer Sister   . COPD Sister   . Stroke Mother   . Prostate cancer Father   . Prostate cancer Brother   . Heart attack Brother     History  Substance Use Topics  . Smoking status: Former Smoker -- 1.0 packs/day for 30 years    Types: Cigarettes    Quit  date: 04/12/1990  . Smokeless tobacco: Not on file  . Alcohol Use: No    OB History    Grav Para Term Preterm Abortions TAB SAB Ect Mult Living                  Review of Systems  Constitutional: Negative for fever and chills.  HENT: Negative for neck pain.   Eyes: Negative for redness.  Respiratory: Positive for shortness of breath. Negative for cough.   Cardiovascular: Negative for palpitations and leg swelling.  Gastrointestinal: Negative for abdominal pain.  Genitourinary: Negative for flank pain.  Musculoskeletal: Negative for back pain.  Skin: Negative for rash.  Neurological: Negative for headaches.  Hematological: Does not bruise/bleed easily.  Psychiatric/Behavioral: Negative for confusion.    Allergies  Avelox; Codeine; Milk-related compounds; and Lisinopril  Home Medications   Current Outpatient Rx  Name  Route  Sig  Dispense  Refill  . ALBUTEROL SULFATE HFA 108 (90 BASE) MCG/ACT IN AERS   Inhalation   Inhale 2 puffs into the lungs every 4 (four) hours as needed. Shortness of breath         . ALBUTEROL SULFATE (2.5 MG/3ML) 0.083% IN NEBU   Nebulization   Take 2.5 mg by nebulization every 4 (four) hours as needed. Shortness of breath         . ALPRAZOLAM  0.5 MG PO TABS   Oral   Take 0.25 mg by mouth 2 (two) times daily as needed. For anxiety         . AMLODIPINE BESYLATE 10 MG PO TABS   Oral   Take 5 mg by mouth daily.          . ASPIRIN 81 MG PO TABS   Oral   Take 81 mg by mouth daily.         . BUDESONIDE-FORMOTEROL FUMARATE 160-4.5 MCG/ACT IN AERO   Inhalation   Inhale 2 puffs into the lungs 2 (two) times daily.   3 Inhaler   3   . BUDESONIDE-FORMOTEROL FUMARATE 160-4.5 MCG/ACT IN AERO   Inhalation   Inhale 2 puffs into the lungs 2 (two) times daily.   1 Inhaler   0   . ESTRADIOL 0.5 MG PO TABS   Oral   Take 0.25 mg by mouth daily.          . OMEGA-3 FATTY ACIDS 1000 MG PO CAPS   Oral   Take 2 g by mouth 2 (two) times  daily.         . GUAIFENESIN 400 MG PO TABS   Oral   Take 400 mg by mouth every 4 (four) hours as needed. For chest congestion         . HYDROCHLOROTHIAZIDE 25 MG PO TABS   Oral   Take 25 mg by mouth daily.         . ISOSORBIDE MONONITRATE ER 30 MG PO TB24   Oral   Take 60 mg by mouth daily.          [name] LOSARTAN POTASSIUM 25 MG PO TABS   Oral   Take 25 mg by mouth daily.          [name] MAGNESIUM 250 MG PO TABS   Oral   Take 2 tablets by mouth 3 (three) times daily.          [name] ONE-DAILY MULTI VITAMINS PO TABS   Oral   Take 1 tablet by mouth daily.          [name] NITROGLYCERIN 0.4 MG SL SUBL   Sublingual   Place 0.4 mg under the tongue every 5 (five) minutes as needed. Chest pain         . OMEPRAZOLE 20 MG PO CPDR   Oral   Take 20 mg by mouth daily.          [name] POTASSIUM CHLORIDE CRYS ER 20 MEQ PO TBCR   Oral   Take 20 mEq by mouth daily.          [name] TIOTROPIUM BROMIDE MONOHYDRATE 18 MCG IN CAPS   Inhalation   Place 1 capsule (18 mcg total) into inhaler and inhale daily.   90 capsule   3   . TIOTROPIUM BROMIDE MONOHYDRATE 18 MCG IN CAPS   Inhalation   Place 1 capsule (18 mcg total) into inhaler and inhale daily.   10 capsule   0   . VITAMIN D (ERGOCALCIFEROL) PO   Oral   Take 1 tablet by mouth daily.            BP 171/61  Pulse 69  Temp 98.1 F (36.7 C) (Oral)  Resp 22  SpO2 99%  Physical Exam  Nursing note and vitals reviewed. Constitutional: She is oriented to person, place, and time. She appears well-developed and well-nourished. No distress.  HENT:  Mouth/Throat: Oropharynx is clear  and moist.  Eyes: Conjunctivae normal are normal. No scleral icterus.  Neck: Neck supple. No JVD present. No tracheal deviation present.  Cardiovascular: Normal rate, regular rhythm, normal heart sounds and intact distal pulses.   Pulmonary/Chest: Effort normal. No respiratory distress.       Decreased bs bil. Exp wheeze.   Abdominal: Soft. Normal  appearance. She exhibits no distension. There is no tenderness.  Musculoskeletal: She exhibits no edema and no tenderness.  Neurological: She is alert and oriented to person, place, and time.  Skin: Skin is warm and dry. No rash noted.  Psychiatric: She has a normal mood and affect.    ED Course  Procedures (including critical care time)   Results for orders placed during the hospital encounter of 05/13/12  CBC      Component Value Range   WBC 9.7  4.0 - 10.5 K/uL   RBC 4.54  3.87 - 5.11 MIL/uL   Hemoglobin 14.5  12.0 - 15.0 g/dL   HCT 16.1  09.6 - 04.5 %   MCV 89.9  78.0 - 100.0 fL   MCH 31.9  26.0 - 34.0 pg   MCHC 35.5  30.0 - 36.0 g/dL   RDW 40.9  81.1 - 91.4 %   Platelets 153  150 - 400 K/uL  COMPREHENSIVE METABOLIC PANEL      Component Value Range   Sodium 131 (*) 135 - 145 mEq/L   Potassium 3.8  3.5 - 5.1 mEq/L   Chloride 98  96 - 112 mEq/L   CO2 19  19 - 32 mEq/L   Glucose, Bld 93  70 - 99 mg/dL   BUN 25 (*) 6 - 23 mg/dL   Creatinine, Ser 7.82  0.50 - 1.10 mg/dL   Calcium 9.7  8.4 - 95.6 mg/dL   Total Protein 7.2  6.0 - 8.3 g/dL   Albumin 4.1  3.5 - 5.2 g/dL   AST 75 (*) 0 - 37 U/L   ALT 70 (*) 0 - 35 U/L   Alkaline Phosphatase 129 (*) 39 - 117 U/L   Total Bilirubin 2.1 (*) 0.3 - 1.2 mg/dL   GFR calc non Af Amer 80 (*) >90 mL/min   GFR calc Af Amer >90  >90 mL/min  TROPONIN I      Component Value Range   Troponin I <0.30  <0.30 ng/mL  PRO B NATRIURETIC PEPTIDE      Component Value Range   Pro B Natriuretic peptide (BNP) 264.5  0 - 450 pg/mL   Dg Chest Portable 1 View  05/13/2012  *RADIOLOGY REPORT*  Clinical Data: Short of breath  PORTABLE CHEST - 1 VIEW  Comparison: Prior chest x-ray 10/08/2011  Findings: Increased linear atelectasis versus scarring in the left base.  Otherwise, stable background changes of hyperexpansion chronic bronchitic changes and emphysema.  Cardiac and mediastinal contours remain within normal limits.  No pneumothorax or pleural  effusion.  Aortic atherosclerosis again noted.  IMPRESSION:  1.  Left basilar atelectasis versus scarring 2.  Stable background changes of COPD/emphysema   Original Report Authenticated By: Malachy Moan, M.D.       MDM  Iv ns. Labs. ecg cxr.   Reviewed nursing notes and prior charts for additional history.    Date: 05/13/2012  Rate: 68  Rhythm: normal sinus rhythm  QRS Axis: normal  Intervals: normal  ST/T Wave abnormalities: nonspecific ST changes  Conduction Disutrbances:none  Narrative Interpretation:   Old EKG Reviewed: unchanged  Albuterol and atrovent neb.  Post 3 alb/atrovent nebs. Improved air exchange, but wheezing/dyspnea persists, not at baseline.  No chest pain.   [facility] called to admit - they will admit, they request tele to Dr Kevan Ny, they will do admit.        Suzi Roots, MD 05/13/12 1256

## 2012-05-13 NOTE — ED Notes (Signed)
Pt undressed, in gown, on monitor, continuous pulse oximetry, blood pressure cuff and oxygen [place](2L); vitals and EKG being performed

## 2012-05-14 LAB — BASIC METABOLIC PANEL
BUN: 27 mg/dL — ABNORMAL HIGH (ref 6–23)
CO2: 22 mEq/L (ref 19–32)
Chloride: 97 mEq/L (ref 96–112)
GFR calc non Af Amer: 79 mL/min — ABNORMAL LOW (ref >90)
Glucose, Bld: 201 mg/dL — ABNORMAL HIGH (ref 70–99)
Potassium: 4.3 mEq/L (ref 3.5–5.1)
Sodium: 130 mEq/L — ABNORMAL LOW (ref 135–145)

## 2012-05-14 LAB — CBC
HCT: 36 % (ref 36.0–46.0)
Hemoglobin: 12.6 g/dL (ref 12.0–15.0)
MCH: 31.4 pg (ref 26.0–34.0)
MCHC: 35 g/dL (ref 30.0–36.0)
MCV: 89.8 fL (ref 78.0–100.0)
RBC: 4.01 MIL/uL (ref 3.87–5.11)

## 2012-05-14 MED ORDER — PREDNISONE 50 MG PO TABS
60.0000 mg | ORAL_TABLET | Freq: Every day | ORAL | Status: DC
  Administered 2012-05-15: 60 mg via ORAL
  Filled 2012-05-14 (×2): qty 1

## 2012-05-14 MED ORDER — PANTOPRAZOLE SODIUM 40 MG PO TBEC
40.0000 mg | DELAYED_RELEASE_TABLET | Freq: Every day | ORAL | Status: DC
  Administered 2012-05-15: 40 mg via ORAL
  Filled 2012-05-14: qty 1

## 2012-05-14 MED ORDER — ASPIRIN 81 MG PO CHEW
81.0000 mg | CHEWABLE_TABLET | Freq: Every day | ORAL | Status: DC
  Administered 2012-05-14 – 2012-05-15 (×2): 81 mg via ORAL
  Filled 2012-05-14 (×2): qty 1

## 2012-05-14 NOTE — Progress Notes (Signed)
Subjective: Fells better but still some tightness in chest  Objective: Vital signs in last 24 hours: Temp:  [98.2 F (36.8 C)-98.6 F (37 C)] 98.6 F (37 C) (02/02 0654) Pulse Rate:  [61-82] 82  (02/02 0654) Resp:  [17-20] 20  (02/02 0654) BP: (128-157)/(37-49) 132/48 mmHg (02/02 0654) SpO2:  [95 %-98 %] 95 % (02/02 0746) Weight:  [63.141 kg (139 lb 3.2 oz)-63.231 kg (139 lb 6.4 oz)] 63.231 kg (139 lb 6.4 oz) (02/02 0654) Weight change:  Last BM Date: 05/13/12  Intake/Output from previous day: 02/01 0701 - 02/02 0700 In: 110 [P.O.:110] Out: -  Intake/Output this shift: Total I/O In: 240 [P.O.:240] Out: -   General appearance: alert and cooperative Resp: few faint bilateral expiratory wheezes Cardio: regular rate and rhythm, S1, S2 normal, no murmur, click, rub or gallop GI: soft, non-tender; bowel sounds normal; no masses,  no organomegaly Extremities: extremities normal, atraumatic, no cyanosis or edema  Lab Results:  Basename 05/14/12 0550 05/13/12 1615  WBC 6.3 6.8  HGB 12.6 14.3  HCT 36.0 40.8  PLT 120* 126*   BMET  Basename 05/14/12 0550 05/13/12 1615 05/13/12 1046  NA 130* -- 131*  K 4.3 -- 3.8  CL 97 -- 98  CO2 22 -- 19  GLUCOSE 201* -- 93  BUN 27* -- 25*  CREATININE 0.73 0.75 --  CALCIUM 9.4 -- 9.7    Studies/Results: Ct Angio Chest Pe W/cm &/or Wo Cm  05/13/2012  *RADIOLOGY REPORT*  Clinical Data: Chest tightness, difficulty breathing, evaluate for PE  CT ANGIOGRAPHY CHEST  Technique:  Multidetector CT imaging of the chest using the standard protocol during bolus administration of intravenous contrast. Multiplanar reconstructed images including MIPs were obtained and reviewed to evaluate the vascular anatomy.  Contrast: 80mL OMNIPAQUE IOHEXOL 350 MG/ML SOLN  Comparison: Chest radiographs dated 05/13/2012  Findings: No evidence of pulmonary embolism.  Extensive centrilobular emphysematous changes.  No suspicious pulmonary nodules.  Mild linear scarring at  the left lung base.  No pleural effusion or pneumothorax.  Visualized thyroid is unremarkable.  The heart is normal in size.  No pericardial effusion.  Coronary atherosclerosis.  Atherosclerotic calcifications of the aortic arch.  No suspicious mediastinal or axillary lymphadenopathy.  Visualized upper abdomen is notable for cirrhosis and trace perihepatic ascites.  Mild degenerative changes of the visualized thoracolumbar spine.  IMPRESSION: No evidence of pulmonary embolism.  Extensive centrilobular emphysematous changes.  Left basilar scarring.  Suspected cirrhosis.   Original Report Authenticated By: Charline Bills, M.D.    Dg Chest Portable 1 View  05/13/2012  *RADIOLOGY REPORT*  Clinical Data: Short of breath  PORTABLE CHEST - 1 VIEW  Comparison: Prior chest x-ray 10/08/2011  Findings: Increased linear atelectasis versus scarring in the left base.  Otherwise, stable background changes of hyperexpansion chronic bronchitic changes and emphysema.  Cardiac and mediastinal contours remain within normal limits.  No pneumothorax or pleural effusion.  Aortic atherosclerosis again noted.  IMPRESSION:  1.  Left basilar atelectasis versus scarring 2.  Stable background changes of COPD/emphysema   Original Report Authenticated By: Malachy Moan, M.D.     Medications: I have reviewed the patient's current medications.  Assessment/Plan: Principal Problem:  *Acute respiratory failure with hypoxia improved oxygenating well on O2 2L, apparently hypoxemic yesterday. CT neg for PE Active Problems:  COPD with acute exacerbation very little wheezing on exam, change steroids to lower dose/po, and  Continue bronchodilaters and O2, no antibiotics  HYPERTENSION  CAD stable   LOS: 1  day   [facility] 05/14/2012, 11:07 AM

## 2012-05-15 LAB — CBC
MCH: 30.6 pg (ref 26.0–34.0)
MCHC: 34.1 g/dL (ref 30.0–36.0)
MCV: 89.6 fL (ref 78.0–100.0)
Platelets: 119 10*3/uL — ABNORMAL LOW (ref 150–400)
RBC: 4.15 MIL/uL (ref 3.87–5.11)

## 2012-05-15 LAB — BASIC METABOLIC PANEL
BUN: 32 mg/dL — ABNORMAL HIGH (ref 6–23)
CO2: 24 mEq/L (ref 19–32)
Calcium: 9.3 mg/dL (ref 8.4–10.5)
GFR calc non Af Amer: 77 mL/min — ABNORMAL LOW (ref >90)
Glucose, Bld: 103 mg/dL — ABNORMAL HIGH (ref 70–99)

## 2012-05-15 MED ORDER — PREDNISONE 10 MG PO TABS: 40.0000 mg | ORAL_TABLET | Freq: Every day | ORAL | Status: AC

## 2012-05-15 MED ORDER — AMLODIPINE BESYLATE 10 MG PO TABS: 5.0000 mg | ORAL_TABLET | Freq: Every day | ORAL | Status: DC

## 2012-05-15 NOTE — Discharge Summary (Signed)
Physician Discharge Summary  NAME:Brandi Cline  XBJ:[id number]  DOB: [birth year]   Admit date: 05/13/2012 Discharge date: 05/15/2012  Discharge Diagnoses:  Principal Problem:  Acute respiratory failure with hypoxia - patient has had a COPD exacerbation, perhaps by a viral syndrome.  Workup for PE negative.  Much improved on bronchodilator therapy and steroids.  She is chronically O2 dependent but not on steroids chronically Active Problems:  HYPERTENSION - controlled  CAD - complaining of some chest pressure felt to be likely secondary to COPD  COPD with acute exacerbation - improving   Discharge Physical Exam:  General Appearance: Alert, cooperative, no distress, appears stated age  Weight change: 0.181 kg (6.4 oz)  Intake/Output Summary (Last 24 hours) at 05/15/12 0759 Last data filed at 05/14/12 1300  Gross per 24 hour  Intake    480 ml  Output      0 ml  Net    480 ml   Filed Vitals:   05/14/12 2038 05/14/12 2100 05/15/12 0224 05/15/12 0530  BP:  118/43  112/47  Pulse:  78  72  Temp:  97.6 F (36.4 C)  98.5 F (36.9 C)  TempSrc:  Oral  Oral  Resp:  16  16  Height:      Weight:    63.322 kg (139 lb 9.6 oz)  SpO2: 96% 95% 97% 98%    Lungs: Diminished breath sounds throughout, chronic and consistent with usual exam Heart: Regular rate and rhythm, S1 and S2 normal, no murmur, rub or gallop Abdomen: Soft, non-tender, bowel sounds active all four quadrants, no masses, no organomegaly Extremities: Extremities normal, atraumatic, no cyanosis or edema Neuro: Alert and oriented x3, nonfocal  Discharge Condition: Improved  Hospital Course: Very pleasant 79 year old female with history of severe COPD, oxygen dependent, CAD status post PTCA and hypertension.  Patient presented to the hospital 2 days ago with a three-day history of progressively worsening shortness of breath.  Albuterol nebs every 3 hours for 24 hours was not helpful.  Shortness of breath was accompanied with  pleuritic chest pain.  CT angio was negative for PE.  Has improved with usual therapies for COPD exacerbation.  Currently not on antibiotics and is afebrile.  If patient is able to walk well in the halls this morning we'll discharge this afternoon on a prednisone taper and usual medications  Things to follow up in the outpatient setting:     Return of shortness of breath  Consults: Noncontributory  Disposition: 01-Home or Self Care  Discharge Orders    Future Appointments: Provider: Department: Dept Phone: Center:   06/26/2012 2:15 PM Waymon Budge, MD [facility] [phone] None     Future Orders Please Complete By Expires   Diet - low sodium heart healthy      Increase activity slowly      Discharge instructions      Comments:   Continue nebulizer therapy and oxygen at home.  Notify M.D. if fever occurs or increasing shortness of breath.  Continue prednisone 40 milligrams daily until you see Dr. Kevan Ny in the office later this week   Call MD for:  temperature >100.4      Call MD for:  difficulty breathing, headache or visual disturbances          Medication List     As of 05/15/2012  7:59 AM    STOP taking these medications         azithromycin 500 MG tablet   Commonly known  as: ZITHROMAX      TAKE these medications         ALPRAZolam 0.5 MG tablet   Commonly known as: XANAX   Take 0.25 mg by mouth 2 (two) times daily as needed. For anxiety      amLODipine 10 MG tablet   Commonly known as: NORVASC   Take 0.5 tablets (5 mg total) by mouth daily.      aspirin 81 MG tablet   Take 81 mg by mouth daily.      budesonide-formoterol 160-4.5 MCG/ACT inhaler   Commonly known as: SYMBICORT   Inhale 2 puffs into the lungs 2 (two) times daily.      estradiol 0.5 MG tablet   Commonly known as: ESTRACE   Take 0.25 mg by mouth daily.      fish oil-omega-3 fatty acids 1000 MG capsule   Take 2 g by mouth 2 (two) times daily.      hydrochlorothiazide 25 MG tablet    Commonly known as: HYDRODIURIL   Take 25 mg by mouth daily.      isosorbide mononitrate 30 MG 24 hr tablet   Commonly known as: IMDUR   Take 60 mg by mouth daily.      losartan 25 MG tablet   Commonly known as: COZAAR   Take 25 mg by mouth daily.      Magnesium 250 MG Tabs   Take 2 tablets by mouth 3 (three) times daily.      multivitamin tablet   Take 1 tablet by mouth daily.      nitroGLYCERIN 0.4 MG SL tablet   Commonly known as: NITROSTAT   Place 0.4 mg under the tongue every 5 (five) minutes as needed. Chest pain      potassium chloride SA 20 MEQ tablet   Commonly known as: K-DUR,KLOR-CON   Take 20 mEq by mouth daily.      predniSONE 10 MG tablet   Commonly known as: DELTASONE   Take 4 tablets (40 mg total) by mouth daily.      PRILOSEC 20 MG capsule   Generic drug: omeprazole   Take 20 mg by mouth daily.      PROAIR HFA 108 (90 BASE) MCG/ACT inhaler   Generic drug: albuterol   Inhale 2 puffs into the lungs every 4 (four) hours as needed. Shortness of breath      albuterol (2.5 MG/3ML) 0.083% nebulizer solution   Commonly known as: PROVENTIL   Take 2.5 mg by nebulization every 4 (four) hours as needed. Shortness of breath      tiotropium 18 MCG inhalation capsule   Commonly known as: SPIRIVA   Place 1 capsule (18 mcg total) into inhaler and inhale daily.      VITAMIN D (ERGOCALCIFEROL) PO   Take 1 tablet by mouth daily.         The results of significant diagnostics from this hospitalization (including imaging, microbiology, ancillary and laboratory) are listed below for reference.    Significant Diagnostic Studies: Ct Angio Chest Pe W/cm &/or Wo Cm  05/13/2012  *RADIOLOGY REPORT*  Clinical Data: Chest tightness, difficulty breathing, evaluate for PE  CT ANGIOGRAPHY CHEST  Technique:  Multidetector CT imaging of the chest using the standard protocol during bolus administration of intravenous contrast. Multiplanar reconstructed images including MIPs were  obtained and reviewed to evaluate the vascular anatomy.  Contrast: 80mL OMNIPAQUE IOHEXOL 350 MG/ML SOLN  Comparison: Chest radiographs dated 05/13/2012  Findings: No evidence of pulmonary embolism.  Extensive centrilobular emphysematous changes.  No suspicious pulmonary nodules.  Mild linear scarring at the left lung base.  No pleural effusion or pneumothorax.  Visualized thyroid is unremarkable.  The heart is normal in size.  No pericardial effusion.  Coronary atherosclerosis.  Atherosclerotic calcifications of the aortic arch.  No suspicious mediastinal or axillary lymphadenopathy.  Visualized upper abdomen is notable for cirrhosis and trace perihepatic ascites.  Mild degenerative changes of the visualized thoracolumbar spine.  IMPRESSION: No evidence of pulmonary embolism.  Extensive centrilobular emphysematous changes.  Left basilar scarring.  Suspected cirrhosis.   Original Report Authenticated By: Charline Bills, M.D.    Dg Chest Portable 1 View  05/13/2012  *RADIOLOGY REPORT*  Clinical Data: Short of breath  PORTABLE CHEST - 1 VIEW  Comparison: Prior chest x-ray 10/08/2011  Findings: Increased linear atelectasis versus scarring in the left base.  Otherwise, stable background changes of hyperexpansion chronic bronchitic changes and emphysema.  Cardiac and mediastinal contours remain within normal limits.  No pneumothorax or pleural effusion.  Aortic atherosclerosis again noted.  IMPRESSION:  1.  Left basilar atelectasis versus scarring 2.  Stable background changes of COPD/emphysema   Original Report Authenticated By: Malachy Moan, M.D.     Microbiology: No results found for this or any previous visit (from the past 240 hour(s)).   Labs: Results for orders placed during the hospital encounter of 05/13/12  CBC      Component Value Range   WBC 9.7  4.0 - 10.5 K/uL   RBC 4.54  3.87 - 5.11 MIL/uL   Hemoglobin 14.5  12.0 - 15.0 g/dL   HCT 21.3  08.6 - 57.8 %   MCV 89.9  78.0 - 100.0 fL   MCH  31.9  26.0 - 34.0 pg   MCHC 35.5  30.0 - 36.0 g/dL   RDW 46.9  62.9 - 52.8 %   Platelets 153  150 - 400 K/uL  COMPREHENSIVE METABOLIC PANEL      Component Value Range   Sodium 131 (*) 135 - 145 mEq/L   Potassium 3.8  3.5 - 5.1 mEq/L   Chloride 98  96 - 112 mEq/L   CO2 19  19 - 32 mEq/L   Glucose, Bld 93  70 - 99 mg/dL   BUN 25 (*) 6 - 23 mg/dL   Creatinine, Ser 4.13  0.50 - 1.10 mg/dL   Calcium 9.7  8.4 - 24.4 mg/dL   Total Protein 7.2  6.0 - 8.3 g/dL   Albumin 4.1  3.5 - 5.2 g/dL   AST 75 (*) 0 - 37 U/L   ALT 70 (*) 0 - 35 U/L   Alkaline Phosphatase 129 (*) 39 - 117 U/L   Total Bilirubin 2.1 (*) 0.3 - 1.2 mg/dL   GFR calc non Af Amer 80 (*) >90 mL/min   GFR calc Af Amer >90  >90 mL/min  TROPONIN I      Component Value Range   Troponin I <0.30  <0.30 ng/mL  PRO B NATRIURETIC PEPTIDE      Component Value Range   Pro B Natriuretic peptide (BNP) 264.5  0 - 450 pg/mL  CBC      Component Value Range   WBC 6.8  4.0 - 10.5 K/uL   RBC 4.54  3.87 - 5.11 MIL/uL   Hemoglobin 14.3  12.0 - 15.0 g/dL   HCT 01.0  27.2 - 53.6 %   MCV 89.9  78.0 - 100.0 fL   MCH 31.5  26.0 -  34.0 pg   MCHC 35.0  30.0 - 36.0 g/dL   RDW 16.1  09.6 - 04.5 %   Platelets 126 (*) 150 - 400 K/uL  CREATININE, SERUM      Component Value Range   Creatinine, Ser 0.75  0.50 - 1.10 mg/dL   GFR calc non Af Amer 78 (*) >90 mL/min   GFR calc Af Amer >90  >90 mL/min  BASIC METABOLIC PANEL      Component Value Range   Sodium 130 (*) 135 - 145 mEq/L   Potassium 4.3  3.5 - 5.1 mEq/L   Chloride 97  96 - 112 mEq/L   CO2 22  19 - 32 mEq/L   Glucose, Bld 201 (*) 70 - 99 mg/dL   BUN 27 (*) 6 - 23 mg/dL   Creatinine, Ser 4.09  0.50 - 1.10 mg/dL   Calcium 9.4  8.4 - 81.1 mg/dL   GFR calc non Af Amer 79 (*) >90 mL/min   GFR calc Af Amer >90  >90 mL/min  CBC      Component Value Range   WBC 6.3  4.0 - 10.5 K/uL   RBC 4.01  3.87 - 5.11 MIL/uL   Hemoglobin 12.6  12.0 - 15.0 g/dL   HCT 91.4  78.2 - 95.6 %   MCV 89.8   78.0 - 100.0 fL   MCH 31.4  26.0 - 34.0 pg   MCHC 35.0  30.0 - 36.0 g/dL   RDW 21.3  08.6 - 57.8 %   Platelets 120 (*) 150 - 400 K/uL  BASIC METABOLIC PANEL      Component Value Range   Sodium 130 (*) 135 - 145 mEq/L   Potassium 4.2  3.5 - 5.1 mEq/L   Chloride 97  96 - 112 mEq/L   CO2 24  19 - 32 mEq/L   Glucose, Bld 103 (*) 70 - 99 mg/dL   BUN 32 (*) 6 - 23 mg/dL   Creatinine, Ser 4.69  0.50 - 1.10 mg/dL   Calcium 9.3  8.4 - 62.9 mg/dL   GFR calc non Af Amer 77 (*) >90 mL/min   GFR calc Af Amer 90 (*) >90 mL/min  CBC      Component Value Range   WBC 7.2  4.0 - 10.5 K/uL   RBC 4.15  3.87 - 5.11 MIL/uL   Hemoglobin 12.7  12.0 - 15.0 g/dL   HCT 52.8  41.3 - 24.4 %   MCV 89.6  78.0 - 100.0 fL   MCH 30.6  26.0 - 34.0 pg   MCHC 34.1  30.0 - 36.0 g/dL   RDW 01.0  27.2 - 53.6 %   Platelets PENDING  150 - 400 K/uL    Time coordinating discharge: 25 minutes  Signed: Pearla Dubonnet, MD 05/15/2012, 7:59 AM

## 2012-06-26 ENCOUNTER — Ambulatory Visit: Payer: Medicare Other | Admitting: Internal Medicine

## 2012-07-05 ENCOUNTER — Emergency Department (HOSPITAL_COMMUNITY): Payer: Medicare Other

## 2012-07-05 ENCOUNTER — Inpatient Hospital Stay (HOSPITAL_COMMUNITY)
Admission: EM | Admit: 2012-07-05 | Discharge: 2012-07-12 | DRG: 191 | Disposition: A | Discharge status: Home Health | Payer: Medicare Other | Attending: Internal Medicine | Admitting: Internal Medicine

## 2012-07-05 ENCOUNTER — Encounter (HOSPITAL_COMMUNITY): Payer: Self-pay | Admitting: *Deleted

## 2012-07-05 DIAGNOSIS — Z8042 Family history of malignant neoplasm of prostate: Secondary | ICD-10-CM

## 2012-07-05 DIAGNOSIS — Z7982 Long term (current) use of aspirin: Secondary | ICD-10-CM

## 2012-07-05 DIAGNOSIS — J329 Chronic sinusitis, unspecified: Secondary | ICD-10-CM | POA: Diagnosis present

## 2012-07-05 DIAGNOSIS — J309 Allergic rhinitis, unspecified: Secondary | ICD-10-CM

## 2012-07-05 DIAGNOSIS — G500 Trigeminal neuralgia: Secondary | ICD-10-CM | POA: Diagnosis present

## 2012-07-05 DIAGNOSIS — J441 Chronic obstructive pulmonary disease with (acute) exacerbation: Secondary | ICD-10-CM | POA: Diagnosis present

## 2012-07-05 DIAGNOSIS — Z823 Family history of stroke: Secondary | ICD-10-CM

## 2012-07-05 DIAGNOSIS — J449 Chronic obstructive pulmonary disease, unspecified: Secondary | ICD-10-CM

## 2012-07-05 DIAGNOSIS — K219 Gastro-esophageal reflux disease without esophagitis: Secondary | ICD-10-CM | POA: Diagnosis present

## 2012-07-05 DIAGNOSIS — I10 Essential (primary) hypertension: Secondary | ICD-10-CM | POA: Diagnosis present

## 2012-07-05 DIAGNOSIS — E785 Hyperlipidemia, unspecified: Secondary | ICD-10-CM | POA: Diagnosis present

## 2012-07-05 DIAGNOSIS — J440 Chronic obstructive pulmonary disease with acute lower respiratory infection: Principal | ICD-10-CM | POA: Diagnosis present

## 2012-07-05 DIAGNOSIS — Z836 Family history of other diseases of the respiratory system: Secondary | ICD-10-CM

## 2012-07-05 DIAGNOSIS — K5090 Crohn's disease, unspecified, without complications: Secondary | ICD-10-CM | POA: Diagnosis present

## 2012-07-05 DIAGNOSIS — IMO0002 Reserved for concepts with insufficient information to code with codable children: Secondary | ICD-10-CM

## 2012-07-05 DIAGNOSIS — Z8249 Family history of ischemic heart disease and other diseases of the circulatory system: Secondary | ICD-10-CM

## 2012-07-05 DIAGNOSIS — Z79899 Other long term (current) drug therapy: Secondary | ICD-10-CM

## 2012-07-05 DIAGNOSIS — I2510 Atherosclerotic heart disease of native coronary artery without angina pectoris: Secondary | ICD-10-CM | POA: Diagnosis present

## 2012-07-05 DIAGNOSIS — J209 Acute bronchitis, unspecified: Principal | ICD-10-CM | POA: Diagnosis present

## 2012-07-05 DIAGNOSIS — Z87891 Personal history of nicotine dependence: Secondary | ICD-10-CM

## 2012-07-05 DIAGNOSIS — E876 Hypokalemia: Secondary | ICD-10-CM | POA: Diagnosis not present

## 2012-07-05 DIAGNOSIS — Z803 Family history of malignant neoplasm of breast: Secondary | ICD-10-CM

## 2012-07-05 DIAGNOSIS — Z888 Allergy status to other drugs, medicaments and biological substances status: Secondary | ICD-10-CM

## 2012-07-05 DIAGNOSIS — Z87442 Personal history of urinary calculi: Secondary | ICD-10-CM

## 2012-07-05 DIAGNOSIS — Z9861 Coronary angioplasty status: Secondary | ICD-10-CM

## 2012-07-05 DIAGNOSIS — Z9981 Dependence on supplemental oxygen: Secondary | ICD-10-CM

## 2012-07-05 HISTORY — DX: Anxiety disorder, unspecified: F41.9

## 2012-07-05 HISTORY — DX: Angina pectoris, unspecified: I20.9

## 2012-07-05 HISTORY — DX: Unspecified osteoarthritis, unspecified site: M19.90

## 2012-07-05 HISTORY — DX: Family history of other specified conditions: Z84.89

## 2012-07-05 LAB — CBC
MCH: 29.9 pg (ref 26.0–34.0)
MCH: 29.8 pg (ref 26.0–34.0)
MCHC: 34.3 g/dL (ref 30.0–36.0)
MCV: 87.6 fL (ref 78.0–100.0)
Platelets: 139 10*3/uL — ABNORMAL LOW (ref 150–400)
RBC: 4.18 MIL/uL (ref 3.87–5.11)
RDW: 14.5 % (ref 11.5–15.5)
RDW: 14.6 % (ref 11.5–15.5)

## 2012-07-05 LAB — CREATININE, SERUM
Creatinine, Ser: 0.69 mg/dL (ref 0.50–1.10)
GFR calc non Af Amer: 81 mL/min — ABNORMAL LOW (ref >90)

## 2012-07-05 LAB — BASIC METABOLIC PANEL
CO2: 24 mEq/L (ref 19–32)
Calcium: 9.5 mg/dL (ref 8.4–10.5)
Creatinine, Ser: 0.64 mg/dL (ref 0.50–1.10)

## 2012-07-05 LAB — POCT I-STAT TROPONIN I: Troponin i, poc: 0 ng/mL (ref 0.00–0.08)

## 2012-07-05 MED ORDER — PANTOPRAZOLE SODIUM 40 MG PO TBEC
40.0000 mg | DELAYED_RELEASE_TABLET | Freq: Every day | ORAL | Status: DC
  Administered 2012-07-06 – 2012-07-12 (×7): 40 mg via ORAL
  Filled 2012-07-05 (×7): qty 1

## 2012-07-05 MED ORDER — ALBUTEROL SULFATE (5 MG/ML) 0.5% IN NEBU
5.0000 mg | INHALATION_SOLUTION | Freq: Once | RESPIRATORY_TRACT | Status: AC
  Administered 2012-07-05: 5 mg via RESPIRATORY_TRACT
  Filled 2012-07-05: qty 1

## 2012-07-05 MED ORDER — SODIUM CHLORIDE 0.9 % IJ SOLN: 3.0000 mL | INTRAMUSCULAR | Status: DC | PRN

## 2012-07-05 MED ORDER — ISOSORBIDE MONONITRATE ER 30 MG PO TB24
30.0000 mg | ORAL_TABLET | Freq: Two times a day (BID) | ORAL | Status: DC
  Administered 2012-07-05 – 2012-07-11 (×12): 30 mg via ORAL
  Filled 2012-07-05 (×15): qty 1

## 2012-07-05 MED ORDER — SODIUM CHLORIDE 0.9 % IJ SOLN
3.0000 mL | Freq: Two times a day (BID) | INTRAMUSCULAR | Status: DC
  Administered 2012-07-05 – 2012-07-07 (×2): 3 mL via INTRAVENOUS

## 2012-07-05 MED ORDER — ALBUTEROL SULFATE HFA 108 (90 BASE) MCG/ACT IN AERS: 2.0000 | INHALATION_SPRAY | RESPIRATORY_TRACT | Status: DC | PRN

## 2012-07-05 MED ORDER — HYDROCHLOROTHIAZIDE 25 MG PO TABS
25.0000 mg | ORAL_TABLET | Freq: Every day | ORAL | Status: DC
  Administered 2012-07-06 – 2012-07-11 (×6): 25 mg via ORAL
  Filled 2012-07-05 (×7): qty 1

## 2012-07-05 MED ORDER — SIMVASTATIN 20 MG PO TABS
20.0000 mg | ORAL_TABLET | Freq: Every evening | ORAL | Status: DC
  Administered 2012-07-06 – 2012-07-11 (×6): 20 mg via ORAL
  Filled 2012-07-05 (×7): qty 1

## 2012-07-05 MED ORDER — POTASSIUM CHLORIDE CRYS ER 20 MEQ PO TBCR
20.0000 meq | EXTENDED_RELEASE_TABLET | Freq: Every day | ORAL | Status: DC
  Administered 2012-07-06 – 2012-07-11 (×6): 20 meq via ORAL
  Filled 2012-07-05 (×7): qty 1

## 2012-07-05 MED ORDER — ASPIRIN 81 MG PO TABS: 81.0000 mg | ORAL_TABLET | Freq: Every day | ORAL | Status: DC

## 2012-07-05 MED ORDER — SODIUM CHLORIDE 0.9 % IV SOLN
INTRAVENOUS | Status: DC
  Administered 2012-07-06: 50 mL/h via INTRAVENOUS
  Administered 2012-07-07 – 2012-07-11 (×5): via INTRAVENOUS

## 2012-07-05 MED ORDER — SODIUM CHLORIDE 0.9 % IV SOLN
250.0000 mL | INTRAVENOUS | Status: DC | PRN
  Administered 2012-07-05: 250 mL via INTRAVENOUS

## 2012-07-05 MED ORDER — ALBUTEROL SULFATE (5 MG/ML) 0.5% IN NEBU: 5.0000 mg | INHALATION_SOLUTION | RESPIRATORY_TRACT | Status: DC | PRN

## 2012-07-05 MED ORDER — ASPIRIN 81 MG PO CHEW
81.0000 mg | CHEWABLE_TABLET | Freq: Every day | ORAL | Status: DC
Start: 2012-07-06 — End: 2012-07-12
  Administered 2012-07-06 – 2012-07-11 (×6): 81 mg via ORAL
  Filled 2012-07-05 (×8): qty 1

## 2012-07-05 MED ORDER — PREDNISONE 20 MG PO TABS
40.0000 mg | ORAL_TABLET | Freq: Once | ORAL | Status: DC
  Administered 2012-07-05: 40 mg via ORAL

## 2012-07-05 MED ORDER — IPRATROPIUM BROMIDE 0.02 % IN SOLN
0.5000 mg | RESPIRATORY_TRACT | Status: DC
  Administered 2012-07-05 – 2012-07-06 (×2): 0.5 mg via RESPIRATORY_TRACT
  Filled 2012-07-05 (×3): qty 2.5

## 2012-07-05 MED ORDER — PREDNISONE 10 MG PO TABS: 40.0000 mg | ORAL_TABLET | Freq: Every day | ORAL | Status: DC

## 2012-07-05 MED ORDER — PREDNISONE 20 MG PO TABS
40.0000 mg | ORAL_TABLET | Freq: Every day | ORAL | Status: DC
  Administered 2012-07-06: 40 mg via ORAL
  Filled 2012-07-05 (×3): qty 2

## 2012-07-05 MED ORDER — AEROCHAMBER PLUS FLO-VU MEDIUM MISC: 1.0000 | Freq: Once | Status: DC

## 2012-07-05 MED ORDER — AMLODIPINE BESYLATE 5 MG PO TABS
5.0000 mg | ORAL_TABLET | Freq: Every day | ORAL | Status: DC
  Administered 2012-07-06 – 2012-07-11 (×6): 5 mg via ORAL
  Filled 2012-07-05 (×7): qty 1

## 2012-07-05 MED ORDER — ENOXAPARIN SODIUM 40 MG/0.4ML ~~LOC~~ SOLN
40.0000 mg | SUBCUTANEOUS | Status: DC
Start: 2012-07-05 — End: 2012-07-12
  Administered 2012-07-05 – 2012-07-11 (×7): 40 mg via SUBCUTANEOUS
  Filled 2012-07-05 (×8): qty 0.4

## 2012-07-05 MED ORDER — POTASSIUM CHLORIDE CRYS ER 20 MEQ PO TBCR: 40.0000 meq | EXTENDED_RELEASE_TABLET | Freq: Once | ORAL | Status: DC

## 2012-07-05 MED ORDER — ALBUTEROL SULFATE (5 MG/ML) 0.5% IN NEBU: 5.0000 mg | INHALATION_SOLUTION | Freq: Once | RESPIRATORY_TRACT | Status: DC

## 2012-07-05 MED ORDER — LOSARTAN POTASSIUM 50 MG PO TABS
50.0000 mg | ORAL_TABLET | Freq: Every day | ORAL | Status: DC
  Administered 2012-07-06 – 2012-07-11 (×6): 50 mg via ORAL
  Filled 2012-07-05 (×7): qty 1

## 2012-07-05 MED ORDER — ALBUTEROL SULFATE (5 MG/ML) 0.5% IN NEBU
2.5000 mg | INHALATION_SOLUTION | RESPIRATORY_TRACT | Status: DC
  Administered 2012-07-05 – 2012-07-06 (×2): 2.5 mg via RESPIRATORY_TRACT
  Filled 2012-07-05 (×3): qty 0.5

## 2012-07-05 MED ORDER — IPRATROPIUM BROMIDE 0.02 % IN SOLN: 0.5000 mg | Freq: Once | RESPIRATORY_TRACT | Status: DC

## 2012-07-05 MED ORDER — ALPRAZOLAM 0.5 MG PO TABS
0.5000 mg | ORAL_TABLET | Freq: Two times a day (BID) | ORAL | Status: DC | PRN
  Filled 2012-07-05: qty 1

## 2012-07-05 MED ORDER — DEXTROSE 5 % IV SOLN
500.0000 mg | INTRAVENOUS | Status: DC
  Administered 2012-07-05 – 2012-07-11 (×7): 500 mg via INTRAVENOUS
  Filled 2012-07-05 (×9): qty 500

## 2012-07-05 NOTE — ED Notes (Signed)
Patient is Oxygen dependant at home, on 3L [place].

## 2012-07-05 NOTE — ED Notes (Signed)
Called report to Sophia on 6N.

## 2012-07-05 NOTE — H&P (Signed)
PCP:   GATES,ROBERT NEVILL, MD   Chief Complaint:  Shortness of breath  HPI: 79 year old female with a history of COPD, O2 dependent who has been having worsening of shortness of breath, cough, stuffy nose over past few days and was seen at Dr. Kevan Ny office and was sent to the ED for admission. Patient says that today she coughed up with green-colored phlegm, and she has stuffy nose with nasal discharge. She has been having hard time breathing and she feels tight in the chest. She denies any fever, nausea vomiting or diarrhea for dysuria urgency frequency of urination. She denies chest pain  Allergies:   Allergies  Allergen Reactions  . Avelox (Moxifloxacin Hcl In Nacl) Shortness Of Breath, Swelling and Rash  . Codeine Other (See Comments)    Couldn't breath  . Milk-Related Compounds Other (See Comments)    Hurts stomach..   . Lisinopril Rash      Past Medical History  Diagnosis Date  . Dyspnea   . Esophageal reflux   . CAD (coronary artery disease)   . Hypertension   . Crohn's disease   . Allergic rhinitis   . COPD (chronic obstructive pulmonary disease)   . History of kidney stones   . History of shingles   . Trigeminal neuralgia     Past Surgical History  Procedure Laterality Date  . Coronary stents    . Lithotripsy    . Ureteral stent placement    . Cholecystectomy    . Total abdominal hysterectomy    . Partial resections large and small bowel for crohn's    . Breast lumps benign      Prior to Admission medications   Medication Sig Start Date End Date Taking? Authorizing Provider  albuterol (PROAIR HFA) 108 (90 BASE) MCG/ACT inhaler Inhale 2 puffs into the lungs every 4 (four) hours as needed. Shortness of breath   Yes Historical Provider, MD  albuterol (PROVENTIL) (2.5 MG/3ML) 0.083% nebulizer solution Take 2.5 mg by nebulization every 4 (four) hours as needed. Shortness of breath 07/16/11 07/15/12 Yes Tammy S Parrett, NP  ALPRAZolam (XANAX) 0.5 MG tablet Take 0.5  mg by mouth 2 (two) times daily as needed for anxiety. For anxiety   Yes Historical Provider, MD  amLODipine (NORVASC) 10 MG tablet Take 5 mg by mouth daily. 05/15/12  Yes Marden Noble, MD  aspirin 81 MG tablet Take 81 mg by mouth daily.   Yes Historical Provider, MD  budesonide-formoterol (SYMBICORT) 160-4.5 MCG/ACT inhaler Inhale 2 puffs into the lungs 2 (two) times daily. 04/27/12  Yes Waymon Budge, MD  Cholecalciferol (VITAMIN D-3) 5000 UNITS TABS Take 1 tablet by mouth daily.   Yes Historical Provider, MD  cyanocobalamin (,VITAMIN B-12,) 1000 MCG/ML injection Inject 1,000 mcg into the muscle once.   Yes Historical Provider, MD  estradiol (ESTRACE) 0.5 MG tablet Take 0.25 mg by mouth daily.    Yes Historical Provider, MD  fish oil-omega-3 fatty acids 1000 MG capsule Take 2 g by mouth daily.    Yes Historical Provider, MD  hydrochlorothiazide (HYDRODIURIL) 25 MG tablet Take 25 mg by mouth daily.   Yes Historical Provider, MD  isosorbide mononitrate (IMDUR) 30 MG 24 hr tablet Take 30 mg by mouth 2 (two) times daily.    Yes Historical Provider, MD  losartan (COZAAR) 50 MG tablet Take 50 mg by mouth daily.   Yes Historical Provider, MD  magnesium oxide (MAG-OX) 400 MG tablet Take 800 mg by mouth daily.   Yes  Historical Provider, MD  Multiple Vitamin (MULTIVITAMIN) tablet Take 1 tablet by mouth daily.    Yes Historical Provider, MD  nitroGLYCERIN (NITROSTAT) 0.4 MG SL tablet Place 0.4 mg under the tongue every 5 (five) minutes as needed. Chest pain   Yes Historical Provider, MD  omeprazole (PRILOSEC) 20 MG capsule Take 20 mg by mouth daily.    Yes Historical Provider, MD  potassium chloride SA (K-DUR,KLOR-CON) 20 MEQ tablet Take 20 mEq by mouth daily.    Yes Historical Provider, MD  predniSONE (DELTASONE) 20 MG tablet Take 20-60 mg by mouth daily. Take 3 tablets daily for 3 days, then 2 tablets daily for 3 days, then 1 tablet daily for 3 days   Yes Historical Provider, MD  simvastatin (ZOCOR) 20 MG  tablet Take 20 mg by mouth every evening.   Yes Historical Provider, MD  tiotropium (SPIRIVA) 18 MCG inhalation capsule Place 1 capsule (18 mcg total) into inhaler and inhale daily. 04/27/12 04/27/13 Yes Clinton D [name], MD  albuterol (PROVENTIL HFA;VENTOLIN HFA) 108 (90 BASE) MCG/ACT inhaler Inhale 2 puffs into the lungs every 4 (four) hours as needed for wheezing or shortness of breath. 07/05/12   Doug Sou, MD  predniSONE (DELTASONE) 10 MG tablet Take 4 tablets (40 mg total) by mouth daily. 07/05/12   Doug Sou, MD    Social History:  reports that she quit smoking about 22 years ago. Her smoking use included Cigarettes. She has a 30 pack-year smoking history. She does not have any smokeless tobacco history on file. She reports that she does not drink alcohol or use illicit drugs.  Family History  Problem Relation Age of Onset  . Breast cancer Sister   . COPD Sister   . Stroke Mother   . Prostate cancer Father   . Prostate cancer Brother   . Heart attack Brother     Review of Systems:  HEENT: Denies headache, blurred vision, positive runny nose, no sore throat,  Neck: Denies thyroid problems,lymphadenopathy Chest : See history of present illness Heart : Denies Chest pain,  coronary arterey disease GI: Denies  nausea, vomiting, diarrhea, constipation GU: Denies dysuria, urgency, frequency of urination, hematuria Neuro: Denies stroke, seizures, syncope Psych: Denies depression, anxiety, hallucinations   Physical Exam: Blood pressure 138/46, pulse 76, temperature 97.8 F (36.6 C), temperature source Oral, resp. rate 17, SpO2 96.00%. Constitutional:   Patient is a well-developed and well-nourished female in no acute distress and cooperative with exam. Head: Normocephalic and atraumatic Nose: Nasal mucosa is mildly erythematous Mouth: Mucus membranes moist Eyes: PERRL, EOMI, conjunctivae normal Neck: Supple, No Thyromegaly Cardiovascular: RRR, S1 normal, S2  normal Pulmonary/Chest: Clear" bilaterally though the breath sounds are reduced Abdominal: Soft. Non-tender, non-distended, bowel sounds are normal, no masses, organomegaly, or guarding present.  Neurological: A&O x3, Strenght is normal and symmetric bilaterally, cranial nerve II-XII are grossly intact, no focal motor deficit, sensory intact to light touch bilaterally.  Extremities : No Cyanosis, Clubbing or Edema   Labs on Admission:  Results for orders placed during the hospital encounter of 07/05/12 (from the past 48 hour(s))  CBC     Status: Abnormal   Collection Time    07/05/12  3:21 PM      Result Value Range   WBC 6.3  4.0 - 10.5 K/uL   RBC 4.18  3.87 - 5.11 MIL/uL   Hemoglobin 12.5  12.0 - 15.0 g/dL   HCT 30.8  65.7 - 84.6 %   MCV 87.6  78.0 -  100.0 fL   MCH 29.9  26.0 - 34.0 pg   MCHC 34.2  30.0 - 36.0 g/dL   RDW 16.1  09.6 - 04.5 %   Platelets 139 (*) 150 - 400 K/uL  BASIC METABOLIC PANEL     Status: Abnormal   Collection Time    07/05/12  3:21 PM      Result Value Range   Sodium 138  135 - 145 mEq/L   Potassium 3.2 (*) 3.5 - 5.1 mEq/L   Chloride 99  96 - 112 mEq/L   CO2 24  19 - 32 mEq/L   Glucose, Bld 236 (*) 70 - 99 mg/dL   BUN 16  6 - 23 mg/dL   Creatinine, Ser 4.09  0.50 - 1.10 mg/dL   Calcium 9.5  8.4 - 81.1 mg/dL   GFR calc non Af Amer 83 (*) >90 mL/min   GFR calc Af Amer >90  >90 mL/min   Comment:            The eGFR has been calculated     using the CKD EPI equation.     This calculation has not been     validated in all clinical     situations.     eGFR's persistently     <90 mL/min signify     possible Chronic Kidney Disease.  POCT I-STAT TROPONIN I     Status: None   Collection Time    07/05/12  4:00 PM      Result Value Range   Troponin i, poc 0.00  0.00 - 0.08 ng/mL   Comment 3            Comment: Due to the release kinetics of cTnI,     a negative result within the first hours     of the onset of symptoms does not rule out     myocardial  infarction with certainty.     If myocardial infarction is still suspected,     repeat the test at appropriate intervals.    Radiological Exams on Admission: Dg Chest 2 View (if Patient Has Fever And/or Copd)  07/05/2012  *RADIOLOGY REPORT*  Clinical Data: Shortness of breath  CHEST - 2 VIEW  Comparison: 05/13/2012  Findings: Cardiomediastinal silhouette is stable.  Hyperinflation again noted.  Again noted chronic mild interstitial prominence. Stable left basilar atelectasis or scarring.  No segmental infiltrate or pulmonary edema.  IMPRESSION: .  Stable hyperinflation and chronic interstitial prominence. Again noted left basilar scarring.  No acute infiltrate or pulmonary edema.   Original Report Authenticated By: [name], M.D.     Assessment/Plan Acute bronchitis Sinusitis Hypokalemia COPD Hypertension  Acute bronchitis Patient has symptoms of acute bronchitis, will start the patient on Zithromax, prednisone, DuoNeb nebulizers every 4 hours  Sinusitis Will start patient on Zithromax as above  Hypokalemia We'll replace the potassium and check BMP in the morning  COPD  patient has home O2, we'll continue her home oxygen.  Hypertension Will continue patient on home medications  Hyperlipidemia Continue Zocor  DVT prophylaxis Lovenox   Time Spent on Admission: 75 min  Hamzah Savoca S [facility] Pager: [phone] 07/05/2012, 7:15 PM

## 2012-07-05 NOTE — ED Provider Notes (Addendum)
History     CSN: [id number]  Arrival date & time 07/05/12  1421   First MD Initiated Contact with Patient 07/05/12 1513      Chief Complaint  Patient presents with  . Shortness of Breath    (Consider location/radiation/quality/duration/timing/severity/associated sxs/prior treatment) HPI Complains of productive cough with green sputum accompanied by shortness of breath onset 2 days ago. She was seen by Dr. Kevan Ny yesterday received a steel at shot however reports no relief. She denies fever denies other associated symptoms no vomiting no other complaint. Nothing makes symptoms better or worse however remained short of breath today. Past Medical History  Diagnosis Date  . Dyspnea   . Esophageal reflux   . CAD (coronary artery disease)   . Hypertension   . Crohn's disease   . Allergic rhinitis   . COPD (chronic obstructive pulmonary disease)   . History of kidney stones   . History of shingles   . Trigeminal neuralgia     Past Surgical History  Procedure Laterality Date  . Coronary stents    . Lithotripsy    . Ureteral stent placement    . Cholecystectomy    . Total abdominal hysterectomy    . Partial resections large and small bowel for crohn's    . Breast lumps benign      Family History  Problem Relation Age of Onset  . Breast cancer Sister   . COPD Sister   . Stroke Mother   . Prostate cancer Father   . Prostate cancer Brother   . Heart attack Brother     History  Substance Use Topics  . Smoking status: Former Smoker -- 1.00 packs/day for 30 years    Types: Cigarettes    Quit date: 04/12/1990  . Smokeless tobacco: Not on file  . Alcohol Use: No    OB History   Grav Para Term Preterm Abortions TAB SAB Ect Mult Living                  Review of Systems  Constitutional: Negative.   HENT: Negative.   Respiratory: Positive for cough and shortness of breath.   Cardiovascular: Negative.   Gastrointestinal: Negative.   Musculoskeletal: Negative.    Skin: Negative.   Neurological: Negative.   Psychiatric/Behavioral: Negative.   All other systems reviewed and are negative.    Allergies  Avelox; Codeine; Milk-related compounds; and Lisinopril  Home Medications   Current Outpatient Rx  Name  Route  Sig  Dispense  Refill  . albuterol (PROAIR HFA) 108 (90 BASE) MCG/ACT inhaler   Inhalation   Inhale 2 puffs into the lungs every 4 (four) hours as needed. Shortness of breath         . albuterol (PROVENTIL) (2.5 MG/3ML) 0.083% nebulizer solution   Nebulization   Take 2.5 mg by nebulization every 4 (four) hours as needed. Shortness of breath         . ALPRAZolam (XANAX) 0.5 MG tablet   Oral   Take 0.25 mg by mouth 2 (two) times daily as needed. For anxiety         . amLODipine (NORVASC) 10 MG tablet   Oral   Take 0.5 tablets (5 mg total) by mouth daily.   30 tablet   1   . aspirin 81 MG tablet   Oral   Take 81 mg by mouth daily.         . budesonide-formoterol (SYMBICORT) 160-4.5 MCG/ACT inhaler   Inhalation  Inhale 2 puffs into the lungs 2 (two) times daily.   1 Inhaler   0   . estradiol (ESTRACE) 0.5 MG tablet   Oral   Take 0.25 mg by mouth daily.          . fish oil-omega-3 fatty acids 1000 MG capsule   Oral   Take 2 g by mouth 2 (two) times daily.         . hydrochlorothiazide (HYDRODIURIL) 25 MG tablet   Oral   Take 25 mg by mouth daily.         . isosorbide mononitrate (IMDUR) 30 MG 24 hr tablet   Oral   Take 60 mg by mouth daily.          [name] losartan (COZAAR) 25 MG tablet   Oral   Take 25 mg by mouth daily.          . Magnesium 250 MG TABS   Oral   Take 2 tablets by mouth 3 (three) times daily.          . Multiple Vitamin (MULTIVITAMIN) tablet   Oral   Take 1 tablet by mouth daily.          . nitroGLYCERIN (NITROSTAT) 0.4 MG SL tablet   Sublingual   Place 0.4 mg under the tongue every 5 (five) minutes as needed. Chest pain         . omeprazole (PRILOSEC) 20 MG  capsule   Oral   Take 20 mg by mouth daily.          . potassium chloride SA (K-DUR,KLOR-CON) 20 MEQ tablet   Oral   Take 20 mEq by mouth daily.          [name] tiotropium (SPIRIVA) 18 MCG inhalation capsule   Inhalation   Place 1 capsule (18 mcg total) into inhaler and inhale daily.   90 capsule   3   . VITAMIN D, ERGOCALCIFEROL, PO   Oral   Take 1 tablet by mouth daily.            BP 140/104  Pulse 87  Temp(Src) 97.5 F (36.4 C) (Oral)  Resp 26  SpO2 95%  Physical Exam  Nursing note and vitals reviewed. Constitutional: She appears well-developed and well-nourished.  HENT:  Head: Normocephalic and atraumatic.  Eyes: Conjunctivae are normal. Pupils are equal, round, and reactive to light.  Neck: Neck supple. No tracheal deviation present. No thyromegaly present.  Cardiovascular: Normal rate and regular rhythm.   No murmur heard. Pulmonary/Chest: Effort normal and breath sounds normal. She has no rales.  Rales at bases bilaterally  Abdominal: Soft. Bowel sounds are normal. She exhibits no distension. There is no tenderness.  Musculoskeletal: Normal range of motion. She exhibits no edema and no tenderness.  Neurological: She is alert. Coordination normal.  Skin: Skin is warm and dry. No rash noted.  Psychiatric: She has a normal mood and affect.    ED Course  Procedures (including critical care time)  Labs Reviewed  CBC  BASIC METABOLIC PANEL   No results found.  Chest x-ray viewed by me At 6:45 PM breathing is improved after nebulized treatment but not at baseline. She appears mildly to moderately dyspneic. His not to want to go home. No diagnosis found.  Results for orders placed during the hospital encounter of 07/05/12  CBC      Result Value Range   WBC 6.3  4.0 - 10.5 K/uL   RBC 4.18  3.87 - 5.11  MIL/uL   Hemoglobin 12.5  12.0 - 15.0 g/dL   HCT 16.1  09.6 - 04.5 %   MCV 87.6  78.0 - 100.0 fL   MCH 29.9  26.0 - 34.0 pg   MCHC 34.2  30.0 - 36.0  g/dL   RDW 40.9  81.1 - 91.4 %   Platelets 139 (*) 150 - 400 K/uL  BASIC METABOLIC PANEL      Result Value Range   Sodium 138  135 - 145 mEq/L   Potassium 3.2 (*) 3.5 - 5.1 mEq/L   Chloride 99  96 - 112 mEq/L   CO2 24  19 - 32 mEq/L   Glucose, Bld 236 (*) 70 - 99 mg/dL   BUN 16  6 - 23 mg/dL   Creatinine, Ser 7.82  0.50 - 1.10 mg/dL   Calcium 9.5  8.4 - 95.6 mg/dL   GFR calc non Af Amer 83 (*) >90 mL/min   GFR calc Af Amer >90  >90 mL/min  POCT I-STAT TROPONIN I      Result Value Range   Troponin i, poc 0.00  0.00 - 0.08 ng/mL   Comment 3            Dg Chest 2 View (if Patient Has Fever And/or Copd)  07/05/2012  *RADIOLOGY REPORT*  Clinical Data: Shortness of breath  CHEST - 2 VIEW  Comparison: 05/13/2012  Findings: Cardiomediastinal silhouette is stable.  Hyperinflation again noted.  Again noted chronic mild interstitial prominence. Stable left basilar atelectasis or scarring.  No segmental infiltrate or pulmonary edema.  IMPRESSION: .  Stable hyperinflation and chronic interstitial prominence. Again noted left basilar scarring.  No acute infiltrate or pulmonary edema.   Original Report Authenticated By: [name], M.D.      Spoke with Dr. Sharl Ma Medical decision making: 23 hour, oxygen nebulizers treatments, steroids Diagnosis#1exacerbation of COPD #2 hyperglycemia #3 hypokalemia        Doug Sou, MD 07/05/12 1859

## 2012-07-05 NOTE — ED Notes (Signed)
Pt to ED c/o sob since Sun.  Was seen by her pcp yesterday who gave her a steroid shot.  Returned to her pcp today with no improvement and was sent here.  Sats of 96% on 2 L, but breathing is labored and lungs are tight.  Pt coughed up "green stuff" today.  Denies chest pain or increased swelling to extremitites.

## 2012-07-06 ENCOUNTER — Encounter (HOSPITAL_COMMUNITY): Payer: Self-pay | Admitting: General Practice

## 2012-07-06 LAB — BASIC METABOLIC PANEL
BUN: 15 mg/dL (ref 6–23)
Creatinine, Ser: 0.67 mg/dL (ref 0.50–1.10)
GFR calc Af Amer: >90 mL/min (ref >90)
GFR calc non Af Amer: 81 mL/min — ABNORMAL LOW (ref >90)
Glucose, Bld: 112 mg/dL — ABNORMAL HIGH (ref 70–99)

## 2012-07-06 MED ORDER — ALBUTEROL SULFATE (5 MG/ML) 0.5% IN NEBU
2.5000 mg | INHALATION_SOLUTION | Freq: Four times a day (QID) | RESPIRATORY_TRACT | Status: DC
  Administered 2012-07-06 (×2): 2.5 mg via RESPIRATORY_TRACT
  Filled 2012-07-06: qty 0.5

## 2012-07-06 MED ORDER — BUDESONIDE-FORMOTEROL FUMARATE 160-4.5 MCG/ACT IN AERO
2.0000 | INHALATION_SPRAY | Freq: Two times a day (BID) | RESPIRATORY_TRACT | Status: DC
  Administered 2012-07-06 – 2012-07-12 (×13): 2 via RESPIRATORY_TRACT
  Filled 2012-07-06: qty 6

## 2012-07-06 MED ORDER — LORATADINE 10 MG PO TABS
10.0000 mg | ORAL_TABLET | Freq: Every day | ORAL | Status: DC
  Administered 2012-07-06 – 2012-07-11 (×6): 10 mg via ORAL
  Filled 2012-07-06 (×7): qty 1

## 2012-07-06 MED ORDER — SALINE SPRAY 0.65 % NA SOLN
1.0000 | NASAL | Status: DC | PRN
  Administered 2012-07-06: 1 via NASAL
  Filled 2012-07-06: qty 44

## 2012-07-06 MED ORDER — GUAIFENESIN ER 600 MG PO TB12
600.0000 mg | ORAL_TABLET | Freq: Two times a day (BID) | ORAL | Status: DC
  Administered 2012-07-06 – 2012-07-11 (×12): 600 mg via ORAL
  Filled 2012-07-06 (×14): qty 1

## 2012-07-06 MED ORDER — ALBUTEROL SULFATE (5 MG/ML) 0.5% IN NEBU
2.5000 mg | INHALATION_SOLUTION | Freq: Three times a day (TID) | RESPIRATORY_TRACT | Status: DC
  Administered 2012-07-07 – 2012-07-12 (×16): 2.5 mg via RESPIRATORY_TRACT
  Filled 2012-07-06 (×17): qty 0.5

## 2012-07-06 MED ORDER — ALPRAZOLAM 0.5 MG PO TABS
0.5000 mg | ORAL_TABLET | Freq: Three times a day (TID) | ORAL | Status: DC | PRN
Start: 2012-07-06 — End: 2012-07-12
  Administered 2012-07-06 – 2012-07-12 (×10): 0.5 mg via ORAL
  Filled 2012-07-06 (×10): qty 1

## 2012-07-06 MED ORDER — IPRATROPIUM BROMIDE 0.02 % IN SOLN
0.5000 mg | Freq: Four times a day (QID) | RESPIRATORY_TRACT | Status: DC
  Administered 2012-07-06 (×2): 0.5 mg via RESPIRATORY_TRACT
  Filled 2012-07-06: qty 2.5

## 2012-07-06 MED ORDER — ALBUTEROL SULFATE (5 MG/ML) 0.5% IN NEBU
2.5000 mg | INHALATION_SOLUTION | RESPIRATORY_TRACT | Status: DC | PRN
  Administered 2012-07-06 – 2012-07-09 (×3): 2.5 mg via RESPIRATORY_TRACT
  Filled 2012-07-06 (×3): qty 0.5

## 2012-07-06 MED ORDER — TIOTROPIUM BROMIDE MONOHYDRATE 18 MCG IN CAPS
18.0000 ug | ORAL_CAPSULE | Freq: Every day | RESPIRATORY_TRACT | Status: DC
  Administered 2012-07-06 – 2012-07-12 (×7): 18 ug via RESPIRATORY_TRACT
  Filled 2012-07-06 (×3): qty 5

## 2012-07-06 NOTE — Progress Notes (Signed)
UR of chart completed.  Received consult re: patient wanting [facility] at d/c to provide her necessary home services.  Unfortunately [facility] is not in network with her managed Medicare plan, [employer].  I confirmed this with a phone call to the [facility] rep for the hospital.  In her network are [facility] and [facility].  If the patient will remain another midnight in the hospital for treatment, MD should change her admission status to INPATIENT.

## 2012-07-06 NOTE — Progress Notes (Signed)
Subjective: Brandi Cline still with labored breathing.  Needs breathing treatments more than every 4 hours only, she also needs when necessary treatments.  No fevers or chills.  No chest pain.  Markedly short of breath with activity even on oxygen.  Phlegm is thick and hard to clear.  Objective: Weight change:   Intake/Output Summary (Last 24 hours) at 07/06/12 0802 Last data filed at 07/05/12 2200  Gross per 24 hour  Intake      0 ml  Output      0 ml  Net      0 ml   Filed Vitals:   07/05/12 2104 07/05/12 2315 07/06/12 0320 07/06/12 0516  BP: 135/51   119/53  Pulse: 78 76 73 68  Temp: 97.7 F (36.5 C)   98 F (36.7 C)  TempSrc: Oral   Oral  Resp: 18 18 18 18   Height: 5\' 5"  (1.651 m)     SpO2: 100% 97% 97% 93%    General Appearance: Alert, cooperative, mild respiratory distress Lungs: Decreased breath sounds throughout with faint end expiratory wheezes Heart: Regular rate and rhythm, S1 and S2 normal, no murmur, rub or gallop Abdomen: Soft, non-tender, bowel sounds active all four quadrants, no masses, no organomegaly Extremities: Extremities normal, atraumatic, no cyanosis or edema Neuro: Oriented x3 nonfocal  Lab Results: Results for orders placed during the hospital encounter of 07/05/12 (from the past 48 hour(s))  CBC     Status: Abnormal   Collection Time    07/05/12  3:21 PM      Result Value Range   WBC 6.3  4.0 - 10.5 K/uL   RBC 4.18  3.87 - 5.11 MIL/uL   Hemoglobin 12.5  12.0 - 15.0 g/dL   HCT 16.1  09.6 - 04.5 %   MCV 87.6  78.0 - 100.0 fL   MCH 29.9  26.0 - 34.0 pg   MCHC 34.2  30.0 - 36.0 g/dL   RDW 40.9  81.1 - 91.4 %   Platelets 139 (*) 150 - 400 K/uL  BASIC METABOLIC PANEL     Status: Abnormal   Collection Time    07/05/12  3:21 PM      Result Value Range   Sodium 138  135 - 145 mEq/L   Potassium 3.2 (*) 3.5 - 5.1 mEq/L   Chloride 99  96 - 112 mEq/L   CO2 24  19 - 32 mEq/L   Glucose, Bld 236 (*) 70 - 99 mg/dL   BUN 16  6 - 23 mg/dL   Creatinine, Ser  7.82  0.50 - 1.10 mg/dL   Calcium 9.5  8.4 - 95.6 mg/dL   GFR calc non Af Amer 83 (*) >90 mL/min   GFR calc Af Amer >90  >90 mL/min   Comment:            The eGFR has been calculated     using the CKD EPI equation.     This calculation has not been     validated in all clinical     situations.     eGFR's persistently     <90 mL/min signify     possible Chronic Kidney Disease.  POCT I-STAT TROPONIN I     Status: None   Collection Time    07/05/12  4:00 PM      Result Value Range   Troponin i, poc 0.00  0.00 - 0.08 ng/mL   Comment 3  Comment: Due to the release kinetics of cTnI,     a negative result within the first hours     of the onset of symptoms does not rule out     myocardial infarction with certainty.     If myocardial infarction is still suspected,     repeat the test at appropriate intervals.  CBC     Status: Abnormal   Collection Time    07/05/12 10:20 PM      Result Value Range   WBC 6.0  4.0 - 10.5 K/uL   RBC 4.09  3.87 - 5.11 MIL/uL   Hemoglobin 12.2  12.0 - 15.0 g/dL   HCT 16.1 (*) 09.6 - 04.5 %   MCV 87.0  78.0 - 100.0 fL   MCH 29.8  26.0 - 34.0 pg   MCHC 34.3  30.0 - 36.0 g/dL   RDW 40.9  81.1 - 91.4 %   Platelets 130 (*) 150 - 400 K/uL  CREATININE, SERUM     Status: Abnormal   Collection Time    07/05/12 10:20 PM      Result Value Range   Creatinine, Ser 0.69  0.50 - 1.10 mg/dL   GFR calc non Af Amer 81 (*) >90 mL/min   GFR calc Af Amer >90  >90 mL/min   Comment:            The eGFR has been calculated     using the CKD EPI equation.     This calculation has not been     validated in all clinical     situations.     eGFR's persistently     <90 mL/min signify     possible Chronic Kidney Disease.  BASIC METABOLIC PANEL     Status: Abnormal   Collection Time    07/06/12  5:15 AM      Result Value Range   Sodium 137  135 - 145 mEq/L   Potassium 3.4 (*) 3.5 - 5.1 mEq/L   Chloride 100  96 - 112 mEq/L   CO2 28  19 - 32 mEq/L   Glucose,  Bld 112 (*) 70 - 99 mg/dL   BUN 15  6 - 23 mg/dL   Creatinine, Ser 7.82  0.50 - 1.10 mg/dL   Calcium 9.1  8.4 - 95.6 mg/dL   GFR calc non Af Amer 81 (*) >90 mL/min   GFR calc Af Amer >90  >90 mL/min   Comment:            The eGFR has been calculated     using the CKD EPI equation.     This calculation has not been     validated in all clinical     situations.     eGFR's persistently     <90 mL/min signify     possible Chronic Kidney Disease.    Studies/Results: Dg Chest 2 View (if Patient Has Fever And/or Copd)  07/05/2012  *RADIOLOGY REPORT*  Clinical Data: Shortness of breath  CHEST - 2 VIEW  Comparison: 05/13/2012  Findings: Cardiomediastinal silhouette is stable.  Hyperinflation again noted.  Again noted chronic mild interstitial prominence. Stable left basilar atelectasis or scarring.  No segmental infiltrate or pulmonary edema.  IMPRESSION: .  Stable hyperinflation and chronic interstitial prominence. Again noted left basilar scarring.  No acute infiltrate or pulmonary edema.   Original Report Authenticated By: [name], M.D.    Medications: Scheduled Meds: . albuterol  2.5 mg Nebulization Q6H  And  . ipratropium  0.5 mg Nebulization Q6H  . amLODipine  5 mg Oral Daily  . aspirin  81 mg Oral Daily  . azithromycin  500 mg Intravenous Q24H  . enoxaparin (LOVENOX) injection  40 mg Subcutaneous Q24H  . guaiFENesin  600 mg Oral BID  . hydrochlorothiazide  25 mg Oral Daily  . isosorbide mononitrate  30 mg Oral BID  . losartan  50 mg Oral Daily  . pantoprazole  40 mg Oral Daily  . potassium chloride SA  20 mEq Oral Daily  . predniSONE  40 mg Oral Q breakfast  . simvastatin  20 mg Oral QPM  . sodium chloride  3 mL Intravenous Q12H   Continuous Infusions: . sodium chloride 50 mL/hr (07/06/12 0020)   PRN Meds:.sodium chloride, albuterol, ALPRAZolam, sodium chloride, sodium chloride  Assessment/Plan:  Acute bronchitis  Sinusitis  Hypokalemia  COPD   Hypertension Allergies    Acute bronchitis  Patient has symptoms of acute bronchitis, will continue the patient on IV Zithromax, oral prednisone, DuoNeb nebulizers every 4 hours   Sinusitis - antibiotics and nasal saline rinses   Hypokalemia - continue to replace  COPD  -  patient has home O2, we'll continue her home oxygen and treat as above   Hypertension - continue usual hypertension medications   Hyperlipidemia - simvastatin  DVT prophylaxis  -  Lovenox    Allergies - Zyrtec  Disposition - expect discharge to home in several days   LOS: 1 day   Pearla Dubonnet, MD 07/06/2012, 8:02 AM

## 2012-07-07 MED ORDER — PREDNISONE 50 MG PO TABS
50.0000 mg | ORAL_TABLET | Freq: Every day | ORAL | Status: DC
  Administered 2012-07-07 – 2012-07-12 (×6): 50 mg via ORAL
  Filled 2012-07-07 (×7): qty 1

## 2012-07-07 NOTE — Progress Notes (Signed)
Subjective: Married continues to be congested.  Markedly short of breath with ambulation in the room.  Slight improvement from yesterday.  Will probably need to 3 more days prior to going home.  If she is not clear prior to going home she tends to bounce back to the hospital quite easily.  Would be prudent to keep her for 2-3 more days.  Continues to need IV azithromycin and may need IV Solu-Medrol as well if it is not improving today  Objective: Weight change:   Intake/Output Summary (Last 24 hours) at 07/07/12 1191 Last data filed at 07/07/12 0700  Gross per 24 hour  Intake 1533.34 ml  Output      0 ml  Net 1533.34 ml   Filed Vitals:   07/06/12 1427 07/06/12 2039 07/06/12 2130 07/07/12 0632  BP:   120/74 124/60  Pulse:   75 75  Temp:   97.7 F (36.5 C) 98.1 F (36.7 C)  TempSrc:   Oral   Resp:   16 19  Height:      Weight:      SpO2: 97% 94% 97% 96%   General Appearance: Alert, cooperative, mild respiratory distress  Lungs: Decreased breath sounds throughout with end expiratory wheezes  Heart: Regular rate and rhythm, S1 and S2 normal, no murmur, rub or gallop  Abdomen: Soft, non-tender, bowel sounds active all four quadrants, no masses, no organomegaly  Extremities: Extremities normal, atraumatic, no cyanosis or edema  Neuro: Oriented x3 nonfocal   Lab Results: Results for orders placed during the hospital encounter of 07/05/12 (from the past 48 hour(s))  CBC     Status: Abnormal   Collection Time    07/05/12  3:21 PM      Result Value Range   WBC 6.3  4.0 - 10.5 K/uL   RBC 4.18  3.87 - 5.11 MIL/uL   Hemoglobin 12.5  12.0 - 15.0 g/dL   HCT 47.8  29.5 - 62.1 %   MCV 87.6  78.0 - 100.0 fL   MCH 29.9  26.0 - 34.0 pg   MCHC 34.2  30.0 - 36.0 g/dL   RDW 30.8  65.7 - 84.6 %   Platelets 139 (*) 150 - 400 K/uL  BASIC METABOLIC PANEL     Status: Abnormal   Collection Time    07/05/12  3:21 PM      Result Value Range   Sodium 138  135 - 145 mEq/L   Potassium 3.2 (*) 3.5  - 5.1 mEq/L   Chloride 99  96 - 112 mEq/L   CO2 24  19 - 32 mEq/L   Glucose, Bld 236 (*) 70 - 99 mg/dL   BUN 16  6 - 23 mg/dL   Creatinine, Ser 9.62  0.50 - 1.10 mg/dL   Calcium 9.5  8.4 - 95.2 mg/dL   GFR calc non Af Amer 83 (*) >90 mL/min   GFR calc Af Amer >90  >90 mL/min   Comment:            The eGFR has been calculated     using the CKD EPI equation.     This calculation has not been     validated in all clinical     situations.     eGFR's persistently     <90 mL/min signify     possible Chronic Kidney Disease.  POCT I-STAT TROPONIN I     Status: None   Collection Time    07/05/12  4:00 PM  Result Value Range   Troponin i, poc 0.00  0.00 - 0.08 ng/mL   Comment 3            Comment: Due to the release kinetics of cTnI,     a negative result within the first hours     of the onset of symptoms does not rule out     myocardial infarction with certainty.     If myocardial infarction is still suspected,     repeat the test at appropriate intervals.  CBC     Status: Abnormal   Collection Time    07/05/12 10:20 PM      Result Value Range   WBC 6.0  4.0 - 10.5 K/uL   RBC 4.09  3.87 - 5.11 MIL/uL   Hemoglobin 12.2  12.0 - 15.0 g/dL   HCT 11.9 (*) 14.7 - 82.9 %   MCV 87.0  78.0 - 100.0 fL   MCH 29.8  26.0 - 34.0 pg   MCHC 34.3  30.0 - 36.0 g/dL   RDW 56.2  13.0 - 86.5 %   Platelets 130 (*) 150 - 400 K/uL  CREATININE, SERUM     Status: Abnormal   Collection Time    07/05/12 10:20 PM      Result Value Range   Creatinine, Ser 0.69  0.50 - 1.10 mg/dL   GFR calc non Af Amer 81 (*) >90 mL/min   GFR calc Af Amer >90  >90 mL/min   Comment:            The eGFR has been calculated     using the CKD EPI equation.     This calculation has not been     validated in all clinical     situations.     eGFR's persistently     <90 mL/min signify     possible Chronic Kidney Disease.  BASIC METABOLIC PANEL     Status: Abnormal   Collection Time    07/06/12  5:15 AM      Result  Value Range   Sodium 137  135 - 145 mEq/L   Potassium 3.4 (*) 3.5 - 5.1 mEq/L   Chloride 100  96 - 112 mEq/L   CO2 28  19 - 32 mEq/L   Glucose, Bld 112 (*) 70 - 99 mg/dL   BUN 15  6 - 23 mg/dL   Creatinine, Ser 7.84  0.50 - 1.10 mg/dL   Calcium 9.1  8.4 - 69.6 mg/dL   GFR calc non Af Amer 81 (*) >90 mL/min   GFR calc Af Amer >90  >90 mL/min   Comment:            The eGFR has been calculated     using the CKD EPI equation.     This calculation has not been     validated in all clinical     situations.     eGFR's persistently     <90 mL/min signify     possible Chronic Kidney Disease.    Studies/Results: Dg Chest 2 View (if Patient Has Fever And/or Copd)  07/05/2012  *RADIOLOGY REPORT*  Clinical Data: Shortness of breath  CHEST - 2 VIEW  Comparison: 05/13/2012  Findings: Cardiomediastinal silhouette is stable.  Hyperinflation again noted.  Again noted chronic mild interstitial prominence. Stable left basilar atelectasis or scarring.  No segmental infiltrate or pulmonary edema.  IMPRESSION: .  Stable hyperinflation and chronic interstitial prominence. Again noted left basilar scarring.  No acute infiltrate or pulmonary edema.   Original Report Authenticated By: [name], M.D.    Medications: Scheduled Meds: . albuterol  2.5 mg Nebulization TID  . amLODipine  5 mg Oral Daily  . aspirin  81 mg Oral Daily  . azithromycin  500 mg Intravenous Q24H  . budesonide-formoterol  2 puff Inhalation BID  . enoxaparin (LOVENOX) injection  40 mg Subcutaneous Q24H  . guaiFENesin  600 mg Oral BID  . hydrochlorothiazide  25 mg Oral Daily  . isosorbide mononitrate  30 mg Oral BID  . loratadine  10 mg Oral Daily  . losartan  50 mg Oral Daily  . pantoprazole  40 mg Oral Daily  . potassium chloride SA  20 mEq Oral Daily  . predniSONE  50 mg Oral Q breakfast  . simvastatin  20 mg Oral QPM  . sodium chloride  3 mL Intravenous Q12H  . tiotropium  18 mcg Inhalation Daily   Continuous Infusions: .  sodium chloride 50 mL/hr (07/06/12 0020)   PRN Meds:.sodium chloride, albuterol, ALPRAZolam, sodium chloride, sodium chloride  Assessment/Plan:  Acute bronchitis  Sinusitis  Hypokalemia  COPD  Hypertension  Allergies    Acute bronchitis  Patient has symptoms of acute bronchitis - will continue the patient on IV Zithromax, increase oral prednisone, DuoNeb nebulizers every 4 hours  Sinusitis - antibiotics and nasal saline rinses  Hypokalemia - continue to replace  COPD - as above  Hypertension - continue usual hypertension medications  Hyperlipidemia - simvastatin  DVT prophylaxis - Lovenox  Allergies - Zyrtec  Disposition - expect discharge to home in several days     LOS: 2 days   Pearla Dubonnet, MD 07/07/2012, 7:12 AM

## 2012-07-08 DIAGNOSIS — E876 Hypokalemia: Secondary | ICD-10-CM | POA: Diagnosis not present

## 2012-07-08 LAB — BASIC METABOLIC PANEL
BUN: 23 mg/dL (ref 6–23)
CO2: 28 mEq/L (ref 19–32)
Calcium: 8.8 mg/dL (ref 8.4–10.5)
Creatinine, Ser: 0.78 mg/dL (ref 0.50–1.10)
Glucose, Bld: 85 mg/dL (ref 70–99)

## 2012-07-08 NOTE — Progress Notes (Signed)
Assessment/Plan: Principal Problem:   COPD exacerbation - slow improvement. Continue current meds.  Active Problems:   HYPERTENSION   COPD   CROHN'S DISEASE - suspect diarrhea due to this but if recurs will check for C diff.    Hypokalemia   Subjective: Had seven diarrheal stools last night. Not really unusual for her as she has Crohn's but timing was surprising to her (it usually happens when she gets up in the morning). She is better now. No abd pain.   Her breathing is about the same, maybe a fraction better.   Objective:  Vital Signs: Filed Vitals:   07/07/12 2046 07/07/12 2146 07/08/12 0528 07/08/12 0815  BP:  116/38 107/44   Pulse:  73 71   Temp:  97.7 F (36.5 C) 98.2 F (36.8 C)   TempSrc:  Oral Oral   Resp:  18 16   Height:      Weight:      SpO2: 94% 98% 98% 96%     EXAM: Decreased breath sounds.    Intake/Output Summary (Last 24 hours) at 07/08/12 0923 Last data filed at 07/08/12 0535  Gross per 24 hour  Intake 1129.17 ml  Output      0 ml  Net 1129.17 ml    Lab Results:  Recent Labs  07/06/12 0515 07/08/12 0735  NA 137 139  K 3.4* 4.3  CL 100 103  CO2 28 28  GLUCOSE 112* 85  BUN 15 23  CREATININE 0.67 0.78  CALCIUM 9.1 8.8   No results found for this basename: AST, ALT, ALKPHOS, BILITOT, PROT, ALBUMIN,  in the last 72 hours No results found for this basename: LIPASE, AMYLASE,  in the last 72 hours  Recent Labs  07/05/12 1521 07/05/12 2220  WBC 6.3 6.0  HGB 12.5 12.2  HCT 36.6 35.6*  MCV 87.6 87.0  PLT 139* 130*   No results found for this basename: CKTOTAL, CKMB, CKMBINDEX, TROPONINI,  in the last 72 hours No components found with this basename: POCBNP,  No results found for this basename: DDIMER,  in the last 72 hours No results found for this basename: HGBA1C,  in the last 72 hours No results found for this basename: CHOL, HDL, LDLCALC, TRIG, CHOLHDL, LDLDIRECT,  in the last 72 hours No results found for this basename: TSH,  T4TOTAL, FREET3, T3FREE, THYROIDAB,  in the last 72 hours No results found for this basename: VITAMINB12, FOLATE, FERRITIN, TIBC, IRON, RETICCTPCT,  in the last 72 hours  Studies/Results: No results found. Medications: Medications administered in the last 24 hours reviewed.  Current Medication List reviewed.    LOS: 3 days   [facility] @ [facility] [phone]) 07/08/2012, 9:23 AM

## 2012-07-09 NOTE — Progress Notes (Signed)
Assessment/Plan: Principal Problem:   COPD exacerbation - continued slow improvement. No changes to regimen today Active Problems:   HYPERTENSION   COPD   CROHN'S DISEASE - diarrhea resolved.    Hypokalemia   Subjective: Feeling a bit better. Cough is occasionally productive. No other diarrhea overnight  Objective:  Vital Signs: Filed Vitals:   07/08/12 1450 07/08/12 2032 07/08/12 2217 07/09/12 0606  BP:   124/48 108/49  Pulse:   71 67  Temp:   97.4 F (36.3 C) 98.1 F (36.7 C)  TempSrc:   Oral Oral  Resp:   18 18  Height:      Weight:      SpO2: 96% 97% 98% 98%     EXAM: LUNGS: dec breaths sounds but clear.    Intake/Output Summary (Last 24 hours) at 07/09/12 0833 Last data filed at 07/08/12 1829  Gross per 24 hour  Intake    720 ml  Output      0 ml  Net    720 ml    Lab Results:  Recent Labs  07/08/12 0735  NA 139  K 4.3  CL 103  CO2 28  GLUCOSE 85  BUN 23  CREATININE 0.78  CALCIUM 8.8   No results found for this basename: AST, ALT, ALKPHOS, BILITOT, PROT, ALBUMIN,  in the last 72 hours No results found for this basename: LIPASE, AMYLASE,  in the last 72 hours No results found for this basename: WBC, NEUTROABS, HGB, HCT, MCV, PLT,  in the last 72 hours No results found for this basename: CKTOTAL, CKMB, CKMBINDEX, TROPONINI,  in the last 72 hours No components found with this basename: POCBNP,  No results found for this basename: DDIMER,  in the last 72 hours No results found for this basename: HGBA1C,  in the last 72 hours No results found for this basename: CHOL, HDL, LDLCALC, TRIG, CHOLHDL, LDLDIRECT,  in the last 72 hours No results found for this basename: TSH, T4TOTAL, FREET3, T3FREE, THYROIDAB,  in the last 72 hours No results found for this basename: VITAMINB12, FOLATE, FERRITIN, TIBC, IRON, RETICCTPCT,  in the last 72 hours  Studies/Results: No results found. Medications: Medications administered in the last 24 hours reviewed.  Current  Medication List reviewed.    LOS: 4 days   [facility] @ [facility] [phone]) 07/09/2012, 8:33 AM

## 2012-07-10 ENCOUNTER — Inpatient Hospital Stay (HOSPITAL_COMMUNITY): Payer: Medicare Other

## 2012-07-10 MED ORDER — PIPERACILLIN-TAZOBACTAM 3.375 G IVPB
3.3750 g | Freq: Two times a day (BID) | INTRAVENOUS | Status: DC
  Administered 2012-07-10 – 2012-07-11 (×4): 3.375 g via INTRAVENOUS
  Filled 2012-07-10 (×6): qty 50

## 2012-07-10 MED ORDER — METHYLPREDNISOLONE SODIUM SUCC 125 MG IJ SOLR
80.0000 mg | Freq: Once | INTRAMUSCULAR | Status: AC
  Administered 2012-07-10: 80 mg via INTRAVENOUS
  Filled 2012-07-10: qty 1.28

## 2012-07-10 NOTE — Progress Notes (Signed)
Subjective: Unfortunately, Brandi Cline has not cleared much over the weekend.  Still coughing up thick yellow phlegm and feels very short of breath with efforts to move about in the room.  Still coughing up a lot of dark yellow phlegm.  Still wheezing.  No hemoptysis.  Currently on oral prednisone and azithromycin and oxygen and respiratory treatments  Objective: Weight change:  No intake or output data in the 24 hours ending 07/10/12 0758 Filed Vitals:   07/09/12 1824 07/09/12 2017 07/09/12 2130 07/10/12 0531  BP:   151/55 120/51  Pulse:   82 72  Temp:   97.9 F (36.6 C) 97.9 F (36.6 C)  TempSrc:   Oral Oral  Resp:   24 18  Height:      Weight:      SpO2: 97% 99% 95% 98%   General Appearance: Alert, cooperative, mild respiratory distress  Lungs: Decreased breath sounds throughout with expiratory wheezes and severe rhonchi with cough Heart: Regular rate and rhythm, S1 and S2 normal, no murmur, rub or gallop  Abdomen: Soft, non-tender, bowel sounds active all four quadrants, no masses, no organomegaly  Extremities: Extremities normal, atraumatic, no cyanosis or edema  Neuro: Oriented x3 nonfocal   Lab Results: No results found for this or any previous visit (from the past 48 hour(s)).  Studies/Results: No results found. Medications: Scheduled Meds: . albuterol  2.5 mg Nebulization TID  . amLODipine  5 mg Oral Daily  . aspirin  81 mg Oral Daily  . azithromycin  500 mg Intravenous Q24H  . budesonide-formoterol  2 puff Inhalation BID  . enoxaparin (LOVENOX) injection  40 mg Subcutaneous Q24H  . guaiFENesin  600 mg Oral BID  . hydrochlorothiazide  25 mg Oral Daily  . isosorbide mononitrate  30 mg Oral BID  . loratadine  10 mg Oral Daily  . losartan  50 mg Oral Daily  . methylPREDNISolone (SOLU-MEDROL) injection  80 mg Intravenous Once  . pantoprazole  40 mg Oral Daily  . piperacillin-tazobactam (ZOSYN)  IV  3.375 g Intravenous Q12H  . potassium chloride SA  20 mEq Oral Daily  .  predniSONE  50 mg Oral Q breakfast  . simvastatin  20 mg Oral QPM  . sodium chloride  3 mL Intravenous Q12H  . tiotropium  18 mcg Inhalation Daily   Continuous Infusions: . sodium chloride 50 mL/hr at 07/09/12 1602   PRN Meds:.sodium chloride, albuterol, ALPRAZolam, sodium chloride, sodium chloride  Assessment/Plan:  Acute bronchitis  Sinusitis  Hypokalemia  COPD  Hypertension  Allergies    Acute bronchitis  Patient has symptoms of acute bronchitis - will continue the patient on IV Zithromax and add Zosyn, continue prednisone and give Solu-Medrol 80 milligrams x1, DuoNeb nebulizers every 4 hours and continue Symbicort and Spiriva Sinusitis - antibiotics and nasal saline rinses  Hypokalemia - check potassium level COPD - increase therapy as above  Hypertension - continue usual hypertension medications  Hyperlipidemia - simvastatin  DVT prophylaxis - Lovenox  Allergies - Zyrtec  Disposition - expect discharge to home in next couple of days    LOS: 5 days   Pearla Dubonnet, MD 07/10/2012, 7:58 AM

## 2012-07-11 MED ORDER — PREDNISONE 20 MG PO TABS: 10.0000 mg | ORAL_TABLET | Freq: Every day | ORAL | Status: DC

## 2012-07-11 MED ORDER — CYANOCOBALAMIN 1000 MCG/ML IJ SOLN: 1000.0000 ug | Freq: Once | INTRAMUSCULAR | Status: DC

## 2012-07-11 MED ORDER — LORATADINE 10 MG PO TABS: 10.0000 mg | ORAL_TABLET | Freq: Every day | ORAL | Status: DC

## 2012-07-11 MED ORDER — ALPRAZOLAM 0.5 MG PO TABS: 0.5000 mg | ORAL_TABLET | Freq: Two times a day (BID) | ORAL | Status: DC | PRN

## 2012-07-11 NOTE — Progress Notes (Signed)
Subjective: Patient is feeling much better today. Perhaps the addition of IV Zosyn has been helpful. She will ambulate today and hopefully home tomorrow with home health  Objective: Weight change:   Intake/Output Summary (Last 24 hours) at 07/11/12 2028 Last data filed at 07/11/12 1810  Gross per 24 hour  Intake   1340 ml  Output      0 ml  Net   1340 ml   Filed Vitals:   07/11/12 0607 07/11/12 0731 07/11/12 1300 07/11/12 1412  BP: 115/52  128/53   Pulse: 71  79   Temp: 98.1 F (36.7 C)  98.5 F (36.9 C)   TempSrc: Oral  Oral   Resp: 18  20   Height:      Weight:      SpO2: 98% 95% 98% 99%    General Appearance: Alert, cooperative, mild respiratory distress  Lungs: Decreased breath sounds throughout with minimal expiratory wheezes and minimal rhonchi with cough  Heart: Regular rate and rhythm, S1 and S2 normal, no murmur, rub or gallop  Abdomen: Soft, non-tender, bowel sounds active all four quadrants, no masses, no organomegaly  Extremities: Extremities normal, atraumatic, no cyanosis or edema  Neuro: Oriented x3 nonfocal   Lab Results: No results found for this or any previous visit (from the past 48 hour(s)).  Studies/Results: Dg Chest Port 1 View  07/10/2012  *RADIOLOGY REPORT*  Clinical Data: Persistent wheezing  PORTABLE CHEST - 1 VIEW  Comparison: July 05, 2012.  Findings: Stable cardiomediastinal silhouette.  No acute pulmonary disease is noted. Hyperinflation of the lungs is noted.  Mild interstitial prominence is seen bilaterally which is unchanged and most likely chronic.  No pneumothorax or pleural effusion is noted. Bony thorax is intact.  IMPRESSION: No acute cardiopulmonary abnormality seen. Chronic findings as described above.   Original Report Authenticated By: [name].,  M.D.    Medications: Scheduled Meds: . albuterol  2.5 mg Nebulization TID  . amLODipine  5 mg Oral Daily  . aspirin  81 mg Oral Daily  . azithromycin  500 mg Intravenous Q24H   . budesonide-formoterol  2 puff Inhalation BID  . enoxaparin (LOVENOX) injection  40 mg Subcutaneous Q24H  . guaiFENesin  600 mg Oral BID  . hydrochlorothiazide  25 mg Oral Daily  . isosorbide mononitrate  30 mg Oral BID  . loratadine  10 mg Oral Daily  . losartan  50 mg Oral Daily  . pantoprazole  40 mg Oral Daily  . piperacillin-tazobactam (ZOSYN)  IV  3.375 g Intravenous Q12H  . potassium chloride SA  20 mEq Oral Daily  . predniSONE  50 mg Oral Q breakfast  . simvastatin  20 mg Oral QPM  . sodium chloride  3 mL Intravenous Q12H  . tiotropium  18 mcg Inhalation Daily   Continuous Infusions: . sodium chloride 50 mL/hr at 07/10/12 1619   PRN Meds:.sodium chloride, albuterol, ALPRAZolam, sodium chloride, sodium chloride  Assessment/Plan: Acute bronchitis  Sinusitis  Hypokalemia  COPD  Hypertension  Allergies    Acute bronchitis  Patient has symptoms of acute bronchitis - will continue the patient on IV Zithromax and continue IV Zosyn, continue prednisone and give Solu-Medrol 80 milligrams x1, DuoNeb nebulizers every 4 hours and continue Symbicort and Spiriva   Sinusitis - antibiotics and nasal saline rinses   Hypokalemia - check potassium level in am   COPD - exacerbation is finally improving   Hypertension - continue usual hypertension medications   Hyperlipidemia - Continue  simvastatin   DVT prophylaxis - Lovenox SQ   Allergies - Zyrtec   Disposition - expect discharge to home in am with Home Health    LOS: 6 days   Pearla Dubonnet, MD 07/11/2012, 8:28 PM

## 2012-07-12 MED ORDER — AMOXICILLIN-POT CLAVULANATE 875-125 MG PO TABS: 1.0000 | ORAL_TABLET | Freq: Two times a day (BID) | ORAL | Status: DC

## 2012-07-12 MED ORDER — AMLODIPINE BESYLATE 10 MG PO TABS: 5.0000 mg | ORAL_TABLET | Freq: Every day | ORAL | Status: DC

## 2012-07-12 NOTE — Discharge Summary (Addendum)
Physician Discharge Summary  NAME:Brandi Cline  ZOX:[id number]  DOB: 22-Oct-1930   Admit date: 07/05/2012 Discharge date: 07/12/2012  Discharge Diagnoses:  Principal Problem:   COPD exacerbation - resolving Active Problems:   HYPERTENSION   COPD   CROHN'S DISEASE   Hypokalemia   Discharge Physical Exam:  General Appearance: Alert, cooperative, no distress, appears stated age  Weight change:   Intake/Output Summary (Last 24 hours) at 07/12/12 0740 Last data filed at 07/12/12 0500  Gross per 24 hour  Intake   2140 ml  Output      0 ml  Net   2140 ml   Filed Vitals:   07/11/12 1412 07/11/12 2150 07/11/12 2219 07/12/12 0607  BP:   127/66 133/51  Pulse:   69 58  Temp:   97.9 F (36.6 C) 97.9 F (36.6 C)  TempSrc:   Oral Oral  Resp:   20 20  Height:      Weight:      SpO2: 99% 98% 99% 100%   General Appearance: Alert, cooperative, no respiratory distress  Lungs: Decreased breath sounds throughout with no expiratory wheezes and minimal rhonchi with cough  Heart: Regular rate and rhythm, S1 and S2 normal, no murmur, rub or gallop  Abdomen: Soft, non-tender, bowel sounds active all four quadrants, no masses, no organomegaly  Extremities: Extremities normal, atraumatic, no cyanosis or edema  Neuro: Oriented x3 nonfocal  Discharge Condition: Much improved  Hospital Course: 79 year old female with a history of COPD, O2 dependent who had been having worsening of shortness of breath, cough, stuffy nose over a few days prior to admission and was seen at my office and was sent to the ED for admission. Patient had coughed up with green-colored phlegm, and she complained of a stuffy nose with nasal discharge. She had been having hard time breathing and she felt tight in the chest. She denied any fever, nausea vomiting or diarrhea for dysuria urgency frequency of urination. She denied chest pain. She was admitted and started on IV azithromycin and and Solu-Medrol and continued nasal  cannula O2 therapy as well as bronchodilators.  She was very slow to clear and Axid did not clear well until intravenous Zosyn was added several days ago to her regimen of azithromycin.  Phlegm was dark yellow but is now clearing.  She is still quite weak with ambulation and will need home physical therapy.  She is homebound currently but with physical therapy and home nursing she should improve to a non-homebound status   Things to follow up in the outpatient setting:  Chest congestion, severe weakness, progressive shortness of breath   Consults:  Noncontributory   Disposition: 01-Home or Self Care  Discharge Orders   Future Orders Complete By Expires     Call MD for:  difficulty breathing, headache or visual disturbances  As directed     Call MD for:  temperature >100.4  As directed     Diet - low sodium heart healthy  As directed     Increase activity slowly  As directed         Medication List    TAKE these medications       albuterol (2.5 MG/3ML) 0.083% nebulizer solution  Commonly known as:  PROVENTIL  Take 2.5 mg by nebulization every 4 (four) hours as needed. Shortness of breath     albuterol 108 (90 BASE) MCG/ACT inhaler  Commonly known as:  PROVENTIL HFA;VENTOLIN HFA  Inhale 2 puffs into the lungs every  4 (four) hours as needed for wheezing or shortness of breath.     ALPRAZolam 0.5 MG tablet  Commonly known as:  XANAX  Take 1 tablet (0.5 mg total) by mouth 2 (two) times daily as needed for anxiety. For anxiety              amLODipine 10 MG tablet  Commonly known as:  NORVASC  Take 0.5 tablets (5 mg total) by mouth daily.     aspirin 81 MG tablet  Take 81 mg by mouth daily.     budesonide-formoterol 160-4.5 MCG/ACT inhaler  Commonly known as:  SYMBICORT  Inhale 2 puffs into the lungs 2 (two) times daily.     cyanocobalamin 1000 MCG/ML injection  Commonly known as:  (VITAMIN B-12)  Inject 1 mL (1,000 mcg total) into the muscle once.     estradiol 0.5 MG  tablet  Commonly known as:  ESTRACE  Take 0.25 mg by mouth daily.     fish oil-omega-3 fatty acids 1000 MG capsule  Take 2 g by mouth daily.     hydrochlorothiazide 25 MG tablet  Commonly known as:  HYDRODIURIL  Take 25 mg by mouth daily.     isosorbide mononitrate 30 MG 24 hr tablet  Commonly known as:  IMDUR  Take 30 mg by mouth 2 (two) times daily.     loratadine 10 MG tablet  Commonly known as:  CLARITIN  Take 1 tablet (10 mg total) by mouth daily.     losartan 50 MG tablet  Commonly known as:  COZAAR  Take 50 mg by mouth daily.     magnesium oxide 400 MG tablet  Commonly known as:  MAG-OX  Take 800 mg by mouth daily.     multivitamin tablet  Take 1 tablet by mouth daily.     nitroGLYCERIN 0.4 MG SL tablet  Commonly known as:  NITROSTAT  Place 0.4 mg under the tongue every 5 (five) minutes as needed. Chest pain     potassium chloride SA 20 MEQ tablet  Commonly known as:  K-DUR,KLOR-CON  Take 20 mEq by mouth daily.      Augmentin 875/125 milligram tablet    Take 1 twice daily for 5 days   #10 tablets dispensed     predniSONE 20 MG tablet  Commonly known as:  DELTASONE  Take 0.5-2 tablets (10-40 mg total) by mouth daily. Take 2 tablets daily for 3 days, then 1.5 tablets daily for 3 days, then 1 tablet daily for 3 days, then 0.5 tablet daily     PRILOSEC 20 MG capsule  Generic drug:  omeprazole  Take 20 mg by mouth daily.     simvastatin 20 MG tablet  Commonly known as:  ZOCOR  Take 20 mg by mouth every evening.     tiotropium 18 MCG inhalation capsule  Commonly known as:  SPIRIVA  Place 1 capsule (18 mcg total) into inhaler and inhale daily.     Vitamin D-3 5000 UNITS Tabs  Take 1 tablet by mouth daily.         The results of significant diagnostics from this hospitalization (including imaging, microbiology, ancillary and laboratory) are listed below for reference.    Significant Diagnostic Studies: Dg Chest 2 View (if Patient Has Fever And/or  Copd)  07/05/2012  *RADIOLOGY REPORT*  Clinical Data: Shortness of breath  CHEST - 2 VIEW  Comparison: 05/13/2012  Findings: Cardiomediastinal silhouette is stable.  Hyperinflation again noted.  Again noted chronic mild  interstitial prominence. Stable left basilar atelectasis or scarring.  No segmental infiltrate or pulmonary edema.  IMPRESSION: .  Stable hyperinflation and chronic interstitial prominence. Again noted left basilar scarring.  No acute infiltrate or pulmonary edema.   Original Report Authenticated By: [name], M.D.    Dg Chest Port 1 View  07/10/2012  *RADIOLOGY REPORT*  Clinical Data: Persistent wheezing  PORTABLE CHEST - 1 VIEW  Comparison: July 05, 2012.  Findings: Stable cardiomediastinal silhouette.  No acute pulmonary disease is noted. Hyperinflation of the lungs is noted.  Mild interstitial prominence is seen bilaterally which is unchanged and most likely chronic.  No pneumothorax or pleural effusion is noted. Bony thorax is intact.  IMPRESSION: No acute cardiopulmonary abnormality seen. Chronic findings as described above.   Original Report Authenticated By: [name].,  M.D.     Microbiology: No results found for this or any previous visit (from the past 240 hour(s)).   Labs: Results for orders placed during the hospital encounter of 07/05/12  CBC      Result Value Range   WBC 6.3  4.0 - 10.5 K/uL   RBC 4.18  3.87 - 5.11 MIL/uL   Hemoglobin 12.5  12.0 - 15.0 g/dL   HCT 45.4  09.8 - 11.9 %   MCV 87.6  78.0 - 100.0 fL   MCH 29.9  26.0 - 34.0 pg   MCHC 34.2  30.0 - 36.0 g/dL   RDW 14.7  82.9 - 56.2 %   Platelets 139 (*) 150 - 400 K/uL  BASIC METABOLIC PANEL      Result Value Range   Sodium 138  135 - 145 mEq/L   Potassium 3.2 (*) 3.5 - 5.1 mEq/L   Chloride 99  96 - 112 mEq/L   CO2 24  19 - 32 mEq/L   Glucose, Bld 236 (*) 70 - 99 mg/dL   BUN 16  6 - 23 mg/dL   Creatinine, Ser 1.30  0.50 - 1.10 mg/dL   Calcium 9.5  8.4 - 86.5 mg/dL   GFR calc non Af Amer 83  (*) >90 mL/min   GFR calc Af Amer >90  >90 mL/min  CBC      Result Value Range   WBC 6.0  4.0 - 10.5 K/uL   RBC 4.09  3.87 - 5.11 MIL/uL   Hemoglobin 12.2  12.0 - 15.0 g/dL   HCT 78.4 (*) 69.6 - 29.5 %   MCV 87.0  78.0 - 100.0 fL   MCH 29.8  26.0 - 34.0 pg   MCHC 34.3  30.0 - 36.0 g/dL   RDW 28.4  13.2 - 44.0 %   Platelets 130 (*) 150 - 400 K/uL  CREATININE, SERUM      Result Value Range   Creatinine, Ser 0.69  0.50 - 1.10 mg/dL   GFR calc non Af Amer 81 (*) >90 mL/min   GFR calc Af Amer >90  >90 mL/min  BASIC METABOLIC PANEL      Result Value Range   Sodium 137  135 - 145 mEq/L   Potassium 3.4 (*) 3.5 - 5.1 mEq/L   Chloride 100  96 - 112 mEq/L   CO2 28  19 - 32 mEq/L   Glucose, Bld 112 (*) 70 - 99 mg/dL   BUN 15  6 - 23 mg/dL   Creatinine, Ser 1.02  0.50 - 1.10 mg/dL   Calcium 9.1  8.4 - 72.5 mg/dL   GFR calc non Af Amer 81 (*) >  90 mL/min   GFR calc Af Amer >90  >90 mL/min  BASIC METABOLIC PANEL      Result Value Range   Sodium 139  135 - 145 mEq/L   Potassium 4.3  3.5 - 5.1 mEq/L   Chloride 103  96 - 112 mEq/L   CO2 28  19 - 32 mEq/L   Glucose, Bld 85  70 - 99 mg/dL   BUN 23  6 - 23 mg/dL   Creatinine, Ser 1.19  0.50 - 1.10 mg/dL   Calcium 8.8  8.4 - 14.7 mg/dL   GFR calc non Af Amer 77 (*) >90 mL/min   GFR calc Af Amer 90 (*) >90 mL/min  CREATININE, SERUM      Result Value Range   Creatinine, Ser 0.72  0.50 - 1.10 mg/dL   GFR calc non Af Amer 80 (*) >90 mL/min   GFR calc Af Amer >90  >90 mL/min  POCT I-STAT TROPONIN I      Result Value Range   Troponin i, poc 0.00  0.00 - 0.08 ng/mL   Comment 3             Time coordinating discharge:  32 minutes   Signed: Pearla Dubonnet, MD 07/12/2012, 7:40 AM

## 2012-12-18 ENCOUNTER — Telehealth: Payer: Self-pay | Admitting: Internal Medicine

## 2012-12-18 NOTE — Telephone Encounter (Signed)
I spoke with pt. She is requesting an appt for tomorrow. She c/o being very SOB and hoarse. She is using her inhalers as directed. She is scheduled to come in and see CDY at 9:45 in the AM. Nothing further needed

## 2012-12-19 ENCOUNTER — Encounter: Payer: Self-pay | Admitting: Internal Medicine

## 2012-12-19 ENCOUNTER — Ambulatory Visit (INDEPENDENT_AMBULATORY_CARE_PROVIDER_SITE_OTHER): Payer: Medicare Other | Admitting: Internal Medicine

## 2012-12-19 VITALS — BP 128/70 | HR 85 | Ht 64.0 in | Wt 149.0 lb

## 2012-12-19 DIAGNOSIS — R490 Dysphonia: Secondary | ICD-10-CM | POA: Insufficient documentation

## 2012-12-19 DIAGNOSIS — J441 Chronic obstructive pulmonary disease with (acute) exacerbation: Secondary | ICD-10-CM

## 2012-12-19 MED ORDER — THEOPHYLLINE ER 100 MG PO CP24: ORAL_CAPSULE | ORAL | Status: DC

## 2012-12-19 NOTE — Patient Instructions (Addendum)
Ok to continue on the prednisone taper as directed  You can run O2 at 3L at rest and while sleeping, up to 4-5 L w/ exertion as needed  Order- referral to [facility] for problem hoarseness, consider laryngoscopy  Script sent for theophylline

## 2012-12-19 NOTE — Progress Notes (Signed)
Subjective:    Patient ID: Brandi Cline, female    DOB: September 11, 1930, 79 y.o.   MRN: [id number] Acute visit- Dr Shelle Iron 09/28/11- The patient comes in today for an acute sick visit.  She has known significant COPD, and is usually followed by Dr. [name].  She gives a 2 to three-day history of increasing shortness of breath, and this culminated in severe shortness of breath this morning.  She is currently a little better after using her morning bronchodilators and her neb treatment.  She has no significant cough, mucus, or congestion.  She does feel a chest heaviness, especially on the left side.  She has a history of coronary disease with stents, but thinks that chest discomfort is different from her usual angina.  Surprisingly, her oxygen saturations today are excellent.  12/06/11 - 80 yoF followed for COPD, complicated by allergic rhinitis, GERD, Crohns Disease, CAD/ stents Patient states had pneumonia/ hosp/ heart cath( ok w/ no new stents) in June. States breathing is a little worse since last office visit. States wears oxygen on 2L as needed. c/o sob, wheezing, and chest tightness every now and then. Denies chest pain and cough. Occasional scant clear mucus. Denies chest pain. Notices dyspnea mainly with exertion, worse in humid weather. CXR 10/08/11-reviewed with her IMPRESSION:  COPD/chronic changes. No active disease.  Original Report Authenticated By: [name], M.D.   03/17/12- 80 yoF followed for COPD, complicated by allergic rhinitis, GERD, Crohns Disease, CAD/ stents FOLLOWS FOR: feels like symibcort and Spiriva not helping anymore-would like something different and cheaper Sister here 2 URI's since August treated by PCP. Needed prednisone.  Feels Symbicort wear off before 12 hour next dose. Wearing O2 more and using neb twice daily.  Little cough- scant clear mucus. Aware of angina despite isorbide. Feet swell- varies, not new or progressive. CXR 10/07/11- COPD  04/27/12-  81 yoF  followed for COPD, complicated by allergic rhinitis, GERD, Crohns Disease, CAD/ stents FOLLOWS FOR: Increased SOB today-unsure of cause(used nebulizer before OV-not much  difference), did not hear any wheezing. Denies any cough or chest congestion Often feels tight at night. Aware of increased fluid retention occasionally. Denies chest pain, infection, wheeze or cough. Last visit we gave sample Breo ellipta, which she blames for fluid retention. Continues oxygen 2 L/Advanced for sleep and as needed  12/19/12- 81 yoF former smoker followed for COPD, complicated by allergic rhinitis, GERD, Crohns Disease, CAD/ stents     Family here. ACUTE:  Increased SOB, tightness that travels around to back and some wheezing.  Sore throat in am and  scratchy all day Continues oxygen 3 L/Advanced for sleep and as needed. She has been more dyspneic over past year. We reviewed Chest CT from the spring, noting it was done because of dyspnea then. Hosp 3/26-07/12/12 for AECOPD with NAD on CXR.  Now 3-4 weeks variable hoarseness- on magic mouthwash and prednisone taper. Some difficulty swallowing.  No cough, scant deep yellow sputum, some tight. No pain, blood, fever, palpitation. Easy DOE needing 3LO2 rest, 4-5L exertion in home. CT chest 07/05/12 IMPRESSION:  . Stable hyperinflation and chronic interstitial prominence. Again  noted left basilar scarring. No acute infiltrate or pulmonary  edema.  Original Report Authenticated By: [name], M.D. CXR 07/10/12 IMPRESSION:  No acute cardiopulmonary abnormality seen. Chronic findings as  described above.  Original Report Authenticated By: [name]., M.D.   ROS-see HPI Constitutional:   No-   weight loss, night sweats, fevers, chills, fatigue, lassitude.  HEENT:   No-  headaches, +difficulty swallowing, tooth/dental problems, +sore throat,       No-  sneezing, itching, ear ache, nasal congestion, post nasal drip,  CV:  No-   chest pain, orthopnea, PND, +swelling  in lower extremities, anasarca, dizziness, palpitations Resp: +  shortness of breath with exertion or at rest.              +   productive cough,  No non-productive cough,  No- coughing up of blood.              +  change in color of mucus.  No- wheezing.   Skin: No-   rash or lesions. GI:  No-   heartburn, indigestion, abdominal pain, nausea, vomiting,  GU:  MS:  No-   joint pain or swelling.   Neuro-     nothing unusual Psych:  No- change in mood or affect. No depression or anxiety.  No memory loss.  Objective:  OBJ- Physical Exam General- Alert, Oriented, Affect-appropriate, Distress- none acute, wheelchair, O2 3L Skin- rash-none, lesions- none, excoriation- none Lymphadenopathy- none Head- atraumatic            Eyes- Gross vision intact, PERRLA, conjunctivae and secretions clear            Ears- +Hearing aids            Nose- Clear, no-Septal dev, mucus, polyps, erosion, perforation             Throat- Mallampati II , mucosa clear , drainage- none, tonsils- atrophic. +Hoarse Neck- flexible , trachea midline, no stridor , thyroid nl, carotid no bruit Chest - symmetrical excursion , unlabored           Heart/CV- RRR , no murmur , no gallop  , no rub, nl s1 s2                           - JVD-+ 1, edema- 2+, stasis changes- none, varices- none           Lung- decreased breath sounds, labored with exertion, wheeze- none, cough- none , dullness-none, rub- none           Chest wall-  Abd-  Br/ Gen/ Rectal- Not done, not indicated Extrem- cyanosis- none, clubbing, none, atrophy- none, strength- nl. Neg Homan's Neuro- grossly intact to observation  Assessment & Plan:

## 2012-12-19 NOTE — Assessment & Plan Note (Signed)
She describes persistent variable hoarseness with maybe some mild dysphagia or reflux. Now on treatment for thrush, but says this has been going on at least a month. I would like ENT laryngoscopy in this former smoker with severe COPD, to make sure we are not missing laryngeal disease.

## 2012-12-19 NOTE — Assessment & Plan Note (Signed)
Chronic hypoxic respiratory failure from end-stage COPD, slowly worse. I don't see any obvious correctable problem. Plan- try theophylline

## 2013-01-30 ENCOUNTER — Ambulatory Visit (INDEPENDENT_AMBULATORY_CARE_PROVIDER_SITE_OTHER): Payer: Medicare Other | Admitting: Internal Medicine

## 2013-01-30 ENCOUNTER — Ambulatory Visit (INDEPENDENT_AMBULATORY_CARE_PROVIDER_SITE_OTHER)
Admission: RE | Admit: 2013-01-30 | Discharge: 2013-01-30 | Disposition: A | Discharge status: Home / Self Care | Payer: Medicare Other | Source: Ambulatory Visit | Attending: Internal Medicine | Admitting: Internal Medicine

## 2013-01-30 ENCOUNTER — Other Ambulatory Visit (INDEPENDENT_AMBULATORY_CARE_PROVIDER_SITE_OTHER): Payer: Medicare Other

## 2013-01-30 ENCOUNTER — Encounter: Payer: Self-pay | Admitting: Internal Medicine

## 2013-01-30 VITALS — BP 110/64 | HR 96 | Ht 64.0 in | Wt 147.0 lb

## 2013-01-30 DIAGNOSIS — J441 Chronic obstructive pulmonary disease with (acute) exacerbation: Secondary | ICD-10-CM

## 2013-01-30 DIAGNOSIS — R0602 Shortness of breath: Secondary | ICD-10-CM

## 2013-01-30 DIAGNOSIS — K219 Gastro-esophageal reflux disease without esophagitis: Secondary | ICD-10-CM

## 2013-01-30 DIAGNOSIS — R609 Edema, unspecified: Secondary | ICD-10-CM

## 2013-01-30 DIAGNOSIS — J449 Chronic obstructive pulmonary disease, unspecified: Secondary | ICD-10-CM

## 2013-01-30 DIAGNOSIS — R490 Dysphonia: Secondary | ICD-10-CM

## 2013-01-30 LAB — HEPATIC FUNCTION PANEL
ALT: 41 U/L — ABNORMAL HIGH (ref 0–35)
AST: 47 U/L — ABNORMAL HIGH (ref 0–37)
Albumin: 3.4 g/dL — ABNORMAL LOW (ref 3.5–5.2)
Total Bilirubin: 1.5 mg/dL — ABNORMAL HIGH (ref 0.3–1.2)
Total Protein: 6.9 g/dL (ref 6.0–8.3)

## 2013-01-30 LAB — BRAIN NATRIURETIC PEPTIDE: Pro B Natriuretic peptide (BNP): 41 pg/mL (ref 0.0–100.0)

## 2013-01-30 LAB — BASIC METABOLIC PANEL
BUN: 18 mg/dL (ref 6–23)
CO2: 26 mEq/L (ref 19–32)
Chloride: 105 mEq/L (ref 96–112)
Glucose, Bld: 126 mg/dL — ABNORMAL HIGH (ref 70–99)
Potassium: 3.6 mEq/L (ref 3.5–5.1)

## 2013-01-30 NOTE — Patient Instructions (Signed)
[facility]  [facility] D[facility] O2 provider due to unsatisfactory support by [facility]. 4 L/Min continuous and portable with light portable,         Dx COPD  Order  CXR   Dx COPD               Lab- BMET, LFT, BNP    Dx dyspnea, peripheral edema, COPD

## 2013-01-30 NOTE — Progress Notes (Signed)
Subjective:    Patient ID: Brandi Cline, female    DOB: 10-09-1930, 79 y.o.   MRN: [id number] Acute visit- Dr Shelle Iron 09/28/11- The patient comes in today for an acute sick visit.  She has known significant COPD, and is usually followed by Dr. [name].  She gives a 2 to three-day history of increasing shortness of breath, and this culminated in severe shortness of breath this morning.  She is currently a little better after using her morning bronchodilators and her neb treatment.  She has no significant cough, mucus, or congestion.  She does feel a chest heaviness, especially on the left side.  She has a history of coronary disease with stents, but thinks that chest discomfort is different from her usual angina.  Surprisingly, her oxygen saturations today are excellent.  12/06/11 - 80 yoF followed for COPD, complicated by allergic rhinitis, GERD, Crohns Disease, CAD/ stents Patient states had pneumonia/ hosp/ heart cath( ok w/ no new stents) in June. States breathing is a little worse since last office visit. States wears oxygen on 2L as needed. c/o sob, wheezing, and chest tightness every now and then. Denies chest pain and cough. Occasional scant clear mucus. Denies chest pain. Notices dyspnea mainly with exertion, worse in humid weather. CXR 10/08/11-reviewed with her IMPRESSION:  COPD/chronic changes. No active disease.  Original Report Authenticated By: [name], M.D.   03/17/12- 80 yoF followed for COPD, complicated by allergic rhinitis, GERD, Crohns Disease, CAD/ stents FOLLOWS FOR: feels like symibcort and Spiriva not helping anymore-would like something different and cheaper Sister here 2 URI's since August treated by PCP. Needed prednisone.  Feels Symbicort wear off before 12 hour next dose. Wearing O2 more and using neb twice daily.  Little cough- scant clear mucus. Aware of angina despite isorbide. Feet swell- varies, not new or progressive. CXR 10/07/11- COPD  04/27/12-  81 yoF  followed for COPD, complicated by allergic rhinitis, GERD, Crohns Disease, CAD/ stents FOLLOWS FOR: Increased SOB today-unsure of cause(used nebulizer before OV-not much  difference), did not hear any wheezing. Denies any cough or chest congestion Often feels tight at night. Aware of increased fluid retention occasionally. Denies chest pain, infection, wheeze or cough. Last visit we gave sample Breo ellipta, which she blames for fluid retention. Continues oxygen 2 L/[facility] for sleep and as needed  12/19/12- 81 yoF former smoker followed for COPD, complicated by allergic rhinitis, GERD, Crohns Disease, CAD/ stents     Family here. ACUTE:  Increased SOB, tightness that travels around to back and some wheezing.  Sore throat in am and  scratchy all day Continues oxygen 3 L/[facility] for sleep and as needed. She has been more dyspneic over past year. We reviewed Chest CT from the spring, noting it was done because of dyspnea then. Hosp 3/26-07/12/12 for AECOPD with NAD on CXR.  Now 3-4 weeks variable hoarseness- on magic mouthwash and prednisone taper. Some difficulty swallowing.  No cough, scant deep yellow sputum, some tight. No pain, blood, fever, palpitation. Easy DOE needing 3LO2 rest, 4-5L exertion in home. CT chest 07/05/12 IMPRESSION:  . Stable hyperinflation and chronic interstitial prominence. Again  noted left basilar scarring. No acute infiltrate or pulmonary  edema.  Original Report Authenticated By: [name], M.D. CXR 07/10/12 IMPRESSION:  No acute cardiopulmonary abnormality seen. Chronic findings as  described above.  Original Report Authenticated By: [name]., M.D.  01/30/13- 28 yoF former smoker followed for COPD, complicated by allergic rhinitis, GERD, Crohns Disease, CAD/ stents  Family here. FOLLOWS FOR: "weak as water"; swelling in feet, staying tired. Good and bad days Weight gain 10 pounds in the past month. Ankles swell. Little cough, scant yellow. Pain mid  thoracic spine. Continues O2 4L/ [facility]. Theophylline was no help and caused palpitations, so quit. Last prednisone 2 weeks ago. ENT/Dr. [name] saw her in September and diagnosed esophageal reflux and thrush. He doubled her acid blocker.  ROS-see HPI Constitutional:   No-   weight loss, night sweats, fevers, chills, fatigue, lassitude. HEENT:   No-  headaches, +difficulty swallowing, tooth/dental problems, +sore throat,       No-  sneezing, itching, ear ache, nasal congestion, post nasal drip,  CV:  No-   chest pain, orthopnea, PND, +swelling in lower extremities, anasarca, dizziness, palpitations Resp: +  shortness of breath with exertion or at rest.             No-  productive cough, no- non-productive cough,  No- coughing up of blood.              +  change in color of mucus.  No- wheezing.   Skin: No-   rash or lesions. GI:  + heartburn, indigestion, no-abdominal pain, nausea, vomiting,  GU:  MS:  No-   joint pain or swelling.  + back pain Neuro-     nothing unusual Psych:  No- change in mood or affect. No depression or anxiety.  No memory loss.  Objective:  OBJ- Physical Exam General- Alert, Oriented, Affect-appropriate, Distress- none acute, wheelchair, O2 3L Skin- rash-none, lesions- none, excoriation- none Lymphadenopathy- none Head- atraumatic            Eyes- Gross vision intact, PERRLA, conjunctivae and secretions clear            Ears- +Hearing aids            Nose- Clear, no-Septal dev, mucus, polyps, erosion, perforation             Throat- Mallampati II , mucosa clear , drainage- none, tonsils- atrophic. +Hoarse Neck- flexible , trachea midline, no stridor , thyroid nl, carotid no bruit Chest - symmetrical excursion , unlabored           Heart/CV- RRR , no murmur , no gallop  , no rub, nl s1 s2                           - JVD-+ 1, edema- 3+, stasis changes- none, varices- none           Lung- decreased breath sounds, labored with exertion, wheeze- none, cough- none ,  dullness-none, rub- none           Chest wall-  Abd-  Br/ Gen/ Rectal- Not done, not indicated Extrem- cyanosis- none, clubbing, none, atrophy- none, strength- nl. Neg Homan's Neuro- grossly intact to observation  Assessment & Plan:

## 2013-02-05 ENCOUNTER — Telehealth: Payer: Self-pay | Admitting: Internal Medicine

## 2013-02-05 NOTE — Telephone Encounter (Signed)
Result Notes    Notes Recorded by Waymon Budge, MD on 01/30/2013 at 5:24 PM Blood chemistry ok except glucose is a little high Liver tests have been abnormal for a long time. Current values are in her normal range or slightly better that usual.  BNP test for heart shows no sign of heart failure.      I spoke with patient about results and she verbalized understanding and had no questions

## 2013-02-12 ENCOUNTER — Encounter: Payer: Self-pay | Admitting: Interventional Cardiology

## 2013-02-13 ENCOUNTER — Other Ambulatory Visit: Payer: Self-pay

## 2013-02-13 DIAGNOSIS — Z1231 Encounter for screening mammogram for malignant neoplasm of breast: Secondary | ICD-10-CM

## 2013-02-14 ENCOUNTER — Other Ambulatory Visit: Payer: Self-pay | Admitting: Internal Medicine

## 2013-02-14 DIAGNOSIS — R609 Edema, unspecified: Secondary | ICD-10-CM

## 2013-02-15 ENCOUNTER — Other Ambulatory Visit: Payer: Self-pay

## 2013-02-15 ENCOUNTER — Other Ambulatory Visit: Payer: Medicare Other

## 2013-02-15 NOTE — Assessment & Plan Note (Signed)
Omeprazole twice daily. Theophylline DC'd.

## 2013-02-15 NOTE — Assessment & Plan Note (Signed)
Dr [name] ENT- reflux, thrush and inhalers

## 2013-02-15 NOTE — Assessment & Plan Note (Signed)
Edema may be cor pulmonale. Plan-flu vaccine, change DME company at her request so that she can get oxygen up to 4 L and light portable if needed, CXR, BMET, LFT, BNP

## 2013-02-16 ENCOUNTER — Ambulatory Visit
Admission: RE | Admit: 2013-02-16 | Discharge: 2013-02-16 | Disposition: A | Discharge status: Home / Self Care | Payer: Medicare Other | Source: Ambulatory Visit | Attending: Internal Medicine | Admitting: Internal Medicine

## 2013-02-16 DIAGNOSIS — R609 Edema, unspecified: Secondary | ICD-10-CM

## 2013-03-14 ENCOUNTER — Ambulatory Visit
Admission: RE | Admit: 2013-03-14 | Discharge: 2013-03-14 | Disposition: A | Discharge status: Home / Self Care | Payer: Medicare Other | Source: Ambulatory Visit

## 2013-03-14 DIAGNOSIS — Z1231 Encounter for screening mammogram for malignant neoplasm of breast: Secondary | ICD-10-CM

## 2013-03-20 ENCOUNTER — Ambulatory Visit (INDEPENDENT_AMBULATORY_CARE_PROVIDER_SITE_OTHER): Payer: Medicare Other | Admitting: Internal Medicine

## 2013-03-20 ENCOUNTER — Encounter: Payer: Self-pay | Admitting: Internal Medicine

## 2013-03-20 VITALS — BP 124/64 | HR 78 | Ht 64.0 in | Wt 146.0 lb

## 2013-03-20 DIAGNOSIS — J449 Chronic obstructive pulmonary disease, unspecified: Secondary | ICD-10-CM

## 2013-03-20 MED ORDER — FUROSEMIDE 40 MG PO TABS: 80.0000 mg | ORAL_TABLET | Freq: Every day | ORAL | Status: DC

## 2013-03-20 MED ORDER — ALBUTEROL SULFATE HFA 108 (90 BASE) MCG/ACT IN AERS: 2.0000 | INHALATION_SPRAY | RESPIRATORY_TRACT | Status: DC | PRN

## 2013-03-20 NOTE — Progress Notes (Signed)
Subjective:    Patient ID: Brandi Cline, female    DOB: 10-09-1930, 79 y.o.   MRN: [id number] Acute visit- Dr Shelle Iron 09/28/11- The patient comes in today for an acute sick visit.  She has known significant COPD, and is usually followed by Dr. [name].  She gives a 2 to three-day history of increasing shortness of breath, and this culminated in severe shortness of breath this morning.  She is currently a little better after using her morning bronchodilators and her neb treatment.  She has no significant cough, mucus, or congestion.  She does feel a chest heaviness, especially on the left side.  She has a history of coronary disease with stents, but thinks that chest discomfort is different from her usual angina.  Surprisingly, her oxygen saturations today are excellent.  12/06/11 - 80 yoF followed for COPD, complicated by allergic rhinitis, GERD, Crohns Disease, CAD/ stents Patient states had pneumonia/ hosp/ heart cath( ok w/ no new stents) in June. States breathing is a little worse since last office visit. States wears oxygen on 2L as needed. c/o sob, wheezing, and chest tightness every now and then. Denies chest pain and cough. Occasional scant clear mucus. Denies chest pain. Notices dyspnea mainly with exertion, worse in humid weather. CXR 10/08/11-reviewed with her IMPRESSION:  COPD/chronic changes. No active disease.  Original Report Authenticated By: [name], M.D.   03/17/12- 80 yoF followed for COPD, complicated by allergic rhinitis, GERD, Crohns Disease, CAD/ stents FOLLOWS FOR: feels like symibcort and Spiriva not helping anymore-would like something different and cheaper Sister here 2 URI's since August treated by PCP. Needed prednisone.  Feels Symbicort wear off before 12 hour next dose. Wearing O2 more and using neb twice daily.  Little cough- scant clear mucus. Aware of angina despite isorbide. Feet swell- varies, not new or progressive. CXR 10/07/11- COPD  04/27/12-  81 yoF  followed for COPD, complicated by allergic rhinitis, GERD, Crohns Disease, CAD/ stents FOLLOWS FOR: Increased SOB today-unsure of cause(used nebulizer before OV-not much  difference), did not hear any wheezing. Denies any cough or chest congestion Often feels tight at night. Aware of increased fluid retention occasionally. Denies chest pain, infection, wheeze or cough. Last visit we gave sample Breo ellipta, which she blames for fluid retention. Continues oxygen 2 L/[facility] for sleep and as needed  12/19/12- 81 yoF former smoker followed for COPD, complicated by allergic rhinitis, GERD, Crohns Disease, CAD/ stents     Family here. ACUTE:  Increased SOB, tightness that travels around to back and some wheezing.  Sore throat in am and  scratchy all day Continues oxygen 3 L/[facility] for sleep and as needed. She has been more dyspneic over past year. We reviewed Chest CT from the spring, noting it was done because of dyspnea then. Hosp 3/26-07/12/12 for AECOPD with NAD on CXR.  Now 3-4 weeks variable hoarseness- on magic mouthwash and prednisone taper. Some difficulty swallowing.  No cough, scant deep yellow sputum, some tight. No pain, blood, fever, palpitation. Easy DOE needing 3LO2 rest, 4-5L exertion in home. CT chest 07/05/12 IMPRESSION:  . Stable hyperinflation and chronic interstitial prominence. Again  noted left basilar scarring. No acute infiltrate or pulmonary  edema.  Original Report Authenticated By: [name], M.D. CXR 07/10/12 IMPRESSION:  No acute cardiopulmonary abnormality seen. Chronic findings as  described above.  Original Report Authenticated By: [name]., M.D.  01/30/13- 28 yoF former smoker followed for COPD, complicated by allergic rhinitis, GERD, Crohns Disease, CAD/ stents  Family here. FOLLOWS FOR: "weak as water"; swelling in feet, staying tired. Good and bad days Weight gain 10 pounds in the past month. Ankles swell. Little cough, scant yellow. Pain mid  thoracic spine. Continues O2 4L/ [facility]. Theophylline was no help and caused palpitations, so quit. Last prednisone 2 weeks ago. ENT/Dr. [name] saw her in September and diagnosed esophageal reflux and thrush. He doubled her acid blocker.  03/20/13- 40 yoF former smoker followed for COPD, complicated by allergic rhinitis, GERD, Crohns Disease, CAD/ stents   Daughter here FOLLOWS FOR: Breathing is unchanged. Reports SOB and coughing. Denies chest tightness or wheezing.   Dr. Kevan Ny is treating with Lasix. She is off of prednisone. Morning cough with clear mucus. Using Flutter device for pulmonary toilet. Medications discussed. Hosp for AECOPD 05/13/12- 05/15/12 CXR 01/30/13 IMPRESSION:  Bibasilar linear opacities, consistent with atelectasis or scarring.  Emphysematous changes.  Electronically Signed  By: [name] M.D.  On: 01/30/2013 16:54  ROS-see HPI Constitutional:   No-   weight loss, night sweats, fevers, chills, fatigue, lassitude. HEENT:   No-  headaches, +difficulty swallowing, tooth/dental problems, +sore throat,       No-  sneezing, itching, ear ache, nasal congestion, post nasal drip,  CV:  No-   chest pain, orthopnea, PND, +swelling in lower extremities, anasarca, dizziness, palpitations Resp: +  shortness of breath with exertion or at rest.             No-  productive cough, no- non-productive cough,  No- coughing up of blood.              +  change in color of mucus.  No- wheezing.   Skin: No-   rash or lesions. GI:  + heartburn, indigestion, no-abdominal pain, nausea, vomiting,  GU:  MS:  No-   joint pain or swelling.  + back pain Neuro-     nothing unusual Psych:  No- change in mood or affect. No depression or anxiety.  No memory loss.  Objective:  OBJ- Physical Exam General- Alert, Oriented, Affect-appropriate, Distress- none acute, wheelchair, O2 3L Skin- rash-none, lesions- none, excoriation- none Lymphadenopathy- none Head- atraumatic            Eyes-  Gross vision intact, PERRLA, conjunctivae and secretions clear            Ears- +Hearing aids            Nose- Clear, no-Septal dev, mucus, polyps, erosion, perforation             Throat- Mallampati II , mucosa clear , drainage- none, tonsils- atrophic. +Hoarse Neck- flexible , trachea midline, no stridor , thyroid nl, carotid no bruit Chest - symmetrical excursion , unlabored           Heart/CV- RRR , no murmur , no gallop  , no rub, nl s1 s2                           - JVD-+ 1, edema- 1+, stasis changes- none, varices- none           Lung- decreased breath sounds, + few crackles, wheeze- none, cough- none , dullness-none, rub-                   none           Chest wall-  Abd-  Br/ Gen/ Rectal- Not done, not indicated Extrem- cyanosis- none, clubbing, none, atrophy- none, strength-  nl. Neg Homan's Neuro- grossly intact to observation  Assessment & Plan:

## 2013-03-20 NOTE — Patient Instructions (Signed)
Script for rescue inhaler refill - Ventolin

## 2013-04-11 NOTE — Assessment & Plan Note (Addendum)
Without acute exacerbation since hospitalized in February 2014. Does have edema suggesting fluid overload component which is being addressed by her primary physician Plan-medications reviewed. Rescue inhaler refilled.

## 2013-04-16 ENCOUNTER — Ambulatory Visit (INDEPENDENT_AMBULATORY_CARE_PROVIDER_SITE_OTHER): Payer: Medicare HMO | Admitting: Internal Medicine

## 2013-04-16 ENCOUNTER — Encounter: Payer: Self-pay | Admitting: Internal Medicine

## 2013-04-16 VITALS — BP 122/70 | HR 85 | Temp 97.6°F | Ht 64.0 in | Wt 142.0 lb

## 2013-04-16 DIAGNOSIS — K219 Gastro-esophageal reflux disease without esophagitis: Secondary | ICD-10-CM

## 2013-04-16 DIAGNOSIS — I10 Essential (primary) hypertension: Secondary | ICD-10-CM

## 2013-04-16 DIAGNOSIS — J449 Chronic obstructive pulmonary disease, unspecified: Secondary | ICD-10-CM

## 2013-04-16 DIAGNOSIS — K5090 Crohn's disease, unspecified, without complications: Secondary | ICD-10-CM

## 2013-04-16 NOTE — Patient Instructions (Signed)

## 2013-04-16 NOTE — Assessment & Plan Note (Signed)
Well controlled on prilosec.

## 2013-04-16 NOTE — Assessment & Plan Note (Signed)
On oxygen as needed Lungs sound good today Continue to follow with Dr. Maple HudsonYoung

## 2013-04-16 NOTE — Assessment & Plan Note (Signed)
Well controlled Continue losartan  Does have unilateral peripheral edema, she is on lasix, will discuss with Dr. Katrinka BlazingSmith

## 2013-04-16 NOTE — Progress Notes (Signed)
Pre-visit discussion using our clinic review tool. No additional management support is needed unless otherwise documented below in the visit note.  

## 2013-04-16 NOTE — Progress Notes (Signed)
HPI Pt presents to the clinic today to establish care. She is transferring care from [facility]. She will be Dr. Elmer Sow pt. She has no concerns today.  Flu: Tetanus: 2013 Pneumovax: 2010 Zostovax: never Mammogram: 03/2013 Colon Screening: 2006 Bone Density: 02/2013 Eye Doctor: yearly Density: no (partials)  Past Medical History  Diagnosis Date  . Dyspnea   . Esophageal reflux   . CAD (coronary artery disease)   . Hypertension   . Crohn's disease   . Allergic rhinitis   . COPD (chronic obstructive pulmonary disease)   . History of kidney stones   . History of shingles   . Trigeminal neuralgia   . Family history of anesthesia complication     " SISTER HAD BAD FEELINGS "  . Anginal pain   . Anxiety     ' JUST FOR MY BREATHING "  . Arthritis     mild in hands  . Hyperlipidemia     Current Outpatient Prescriptions  Medication Sig Dispense Refill  . albuterol (PROVENTIL HFA;VENTOLIN HFA) 108 (90 BASE) MCG/ACT inhaler Inhale 2 puffs into the lungs every 4 (four) hours as needed for wheezing or shortness of breath.  3 Inhaler  3  . albuterol (PROVENTIL) (2.5 MG/3ML) 0.083% nebulizer solution Take 2.5 mg by nebulization every 4 (four) hours as needed. Shortness of breath      . ALPRAZolam (XANAX) 0.5 MG tablet Take 1 tablet (0.5 mg total) by mouth 2 (two) times daily as needed for anxiety. For anxiety  60 tablet  1  . aspirin 81 MG tablet Take 81 mg by mouth daily.      . budesonide-formoterol (SYMBICORT) 160-4.5 MCG/ACT inhaler Inhale 2 puffs into the lungs 2 (two) times daily.  1 Inhaler  0  . Cholecalciferol (VITAMIN D-3) 5000 UNITS TABS Take 1 tablet by mouth daily.      . cyanocobalamin (,VITAMIN B-12,) 1000 MCG/ML injection Inject 1 mL (1,000 mcg total) into the muscle once.  1 mL  11  . estradiol (ESTRACE) 0.5 MG tablet Take 0.25 mg by mouth daily.       . fish oil-omega-3 fatty acids 1000 MG capsule Take 2 g by mouth daily.       . furosemide (LASIX) 40 MG tablet  Take 2 tablets (80 mg total) by mouth daily.  30 tablet    . isosorbide mononitrate (IMDUR) 30 MG 24 hr tablet Take 30 mg by mouth 2 (two) times daily.       [name] loratadine (CLARITIN) 10 MG tablet Take 1 tablet (10 mg total) by mouth daily.  30 tablet  5  . losartan (COZAAR) 50 MG tablet Take 50 mg by mouth daily.      . magnesium oxide (MAG-OX) 400 MG tablet Take 800 mg by mouth daily.      . Multiple Vitamin (MULTIVITAMIN) tablet Take 1 tablet by mouth daily.       . nitroGLYCERIN (NITROSTAT) 0.4 MG SL tablet Place 0.4 mg under the tongue every 5 (five) minutes as needed. Chest pain      . omeprazole (PRILOSEC) 20 MG capsule Take 20 mg by mouth daily.       . potassium chloride SA (K-DUR,KLOR-CON) 20 MEQ tablet Take 20 mEq by mouth daily.       . simvastatin (ZOCOR) 20 MG tablet Take 20 mg by mouth every evening.      . tiotropium (SPIRIVA) 18 MCG inhalation capsule Place 1 capsule (18 mcg total) into inhaler and inhale  daily.  90 capsule  3   No current facility-administered medications for this visit.    Allergies  Allergen Reactions  . Avelox [Moxifloxacin Hcl In Nacl] Shortness Of Breath, Swelling and Rash  . Theophyllines     Slight increased heart rate  . Ciprofloxacin   . Codeine Other (See Comments)    Couldn't breath  . Milk-Related Compounds Other (See Comments)    Hurts stomach..   . Lisinopril Rash    Family History  Problem Relation Age of Onset  . Breast cancer Sister   . COPD Sister   . Stroke Mother   . Prostate cancer Father   . Prostate cancer Brother   . Heart attack Brother     History   Social History  . Marital Status: Widowed    Spouse Name: N/A    Number of Children: N/A  . Years of Education: N/A   Occupational History  . PT [employer]     Social History Main Topics  . Smoking status: Former Smoker -- 1.00 packs/day for 30 years    Types: Cigarettes    Quit date: 04/12/1990  . Smokeless tobacco: Never Used  . Alcohol Use: No  . Drug  Use: No  . Sexual Activity: No   Other Topics Concern  . Not on file   Social History Narrative  . No narrative on file    ROS:  Constitutional: Denies fever, malaise, fatigue, headache or abrupt weight changes.  Respiratory: Dhe ears, wax buildup, runny nose, nasal congestion, bloody nose, or sore throat. Cardiovascular: Denies chest pain, chest tightness, palpitations or swelling in the hands or feet.  ies frequency, urgency, pain with urination, blood in urine, odor or discharge. Neurological: Denies dizziness, difficulty with memory, difficulty with speech or problems with balance and coordination.   No other specific complaints in a complete review of systems (except as listed in HPI above).  PE:  BP 122/70  Pulse 85  Temp(Src) 97.6 F (36.4 C) (Oral)  Ht 5\' 4"  (1.626 m)  Wt 142 lb (64.411 kg)  BMI 24.36 kg/m2  SpO2 96% Wt Readings from Last 3 Encounters:  04/16/13 142 lb (64.411 kg)  03/20/13 146 lb (66.225 kg)  01/30/13 147 lb (66.679 kg)    General: Appears her stated age, well developed, well nourished in NAD. Cardiovascular: Normal rate and rhythm. S1,S2 noted.  No murmur, rubs or gallops noted. No JVD or BLE edema. No carotid bruits noted. Pulmonary/Chest: Normal effort and positive vesicular breath sounds. No respiratory distress. No wheezes, rales or ronchi noted.  Neurological: Alert and oriented. Cranial nerves II-XII intact. Coordination normal. +DTRs bilaterally.   BMET    Component Value Date/Time   NA 140 01/30/2013 1624   K 3.6 01/30/2013 1624   CL 105 01/30/2013 1624   CO2 26 01/30/2013 1624   GLUCOSE 126* 01/30/2013 1624   BUN 18 01/30/2013 1624   CREATININE 0.8 01/30/2013 1624   CALCIUM 9.3 01/30/2013 1624   GFRNONAA 80* 07/12/2012 0520   GFRAA >90 07/12/2012 0520    Lipid Panel     Component Value Date/Time   CHOL  Value: 121        ATP III CLASSIFICATION:  <200     mg/dL   Desirable  [id number]  mg/dL   Borderline High  >=952>=240    mg/dL    High        8/4/13241/06/2009 0317   TRIG 97 04/14/2009 0317   HDL 54 04/14/2009 0317  CHOLHDL 2.2 04/14/2009 0317   VLDL 19 04/14/2009 0317   LDLCALC  Value: 48        Total Cholesterol/HDL:CHD Risk Coronary Heart Disease Risk Table                     Men   Women  1/2 Average Risk   3.4   3.3  Average Risk       5.0   4.4  2 X Average Risk   9.6   7.1  3 X Average Risk  23.4   11.0        Use the calculated Patient Ratio above and the CHD Risk Table to determine the patient's CHD Risk.        ATP III CLASSIFICATION (LDL):  <100     mg/dL   Optimal  161-096  mg/dL   Near or Above                    Optimal  130-159  mg/dL   Borderline  045-409  mg/dL   High  >811     mg/dL   Very High 12/11/4780 9562    CBC    Component Value Date/Time   WBC 6.0 07/05/2012 2220   RBC 4.09 07/05/2012 2220   HGB 12.2 07/05/2012 2220   HCT 35.6* 07/05/2012 2220   PLT 130* 07/05/2012 2220   MCV 87.0 07/05/2012 2220   MCH 29.8 07/05/2012 2220   MCHC 34.3 07/05/2012 2220   RDW 14.6 07/05/2012 2220   LYMPHSABS 1.0 10/05/2011 1151   MONOABS 0.4 10/05/2011 1151   EOSABS 0.1 10/05/2011 1151   BASOSABS 0.0 10/05/2011 1151    Hgb A1C Lab Results  Component Value Date   HGBA1C 6.0* 10/05/2011     Assessment and Plan:

## 2013-04-16 NOTE — Assessment & Plan Note (Signed)
No issues at this time Does have a GI specialist

## 2013-04-23 ENCOUNTER — Encounter: Payer: Self-pay | Admitting: Internal Medicine

## 2013-04-23 ENCOUNTER — Ambulatory Visit (INDEPENDENT_AMBULATORY_CARE_PROVIDER_SITE_OTHER): Payer: Medicare HMO | Admitting: Internal Medicine

## 2013-04-23 VITALS — BP 126/72 | HR 90 | Temp 97.6°F | Wt 144.0 lb

## 2013-04-23 DIAGNOSIS — R109 Unspecified abdominal pain: Secondary | ICD-10-CM

## 2013-04-23 DIAGNOSIS — N390 Urinary tract infection, site not specified: Secondary | ICD-10-CM

## 2013-04-23 DIAGNOSIS — R300 Dysuria: Secondary | ICD-10-CM

## 2013-04-23 LAB — POCT URINALYSIS DIPSTICK
Glucose, UA: NEGATIVE
Ketones, UA: NEGATIVE
NITRITE UA: NEGATIVE
PROTEIN UA: NEGATIVE
RBC UA: NEGATIVE
Spec Grav, UA: <=1.005
Urobilinogen, UA: NEGATIVE
pH, UA: 6

## 2013-04-23 MED ORDER — CEPHALEXIN 500 MG PO CAPS: 500.0000 mg | ORAL_CAPSULE | Freq: Four times a day (QID) | ORAL | Status: DC

## 2013-04-23 NOTE — Progress Notes (Signed)
Pre-visit discussion using our clinic review tool. No additional management support is needed unless otherwise documented below in the visit note.  

## 2013-04-23 NOTE — Patient Instructions (Signed)
Urinary Tract Infection  Urinary tract infections (UTIs) can develop anywhere along your urinary tract. Your urinary tract is your body's drainage system for removing wastes and extra water. Your urinary tract includes two kidneys, two ureters, a bladder, and a urethra. Your kidneys are a pair of bean-shaped organs. Each kidney is about the size of your fist. They are located below your ribs, one on each side of your spine.  CAUSES  Infections are caused by microbes, which are microscopic organisms, including fungi, viruses, and bacteria. These organisms are so small that they can only be seen through a microscope. Bacteria are the microbes that most commonly cause UTIs.  SYMPTOMS   Symptoms of UTIs may vary by age and gender of the patient and by the location of the infection. Symptoms in young women typically include a frequent and intense urge to urinate and a painful, burning feeling in the bladder or urethra during urination. Older women and men are more likely to be tired, shaky, and weak and have muscle aches and abdominal pain. A fever may mean the infection is in your kidneys. Other symptoms of a kidney infection include pain in your back or sides below the ribs, nausea, and vomiting.  DIAGNOSIS  To diagnose a UTI, your caregiver will ask you about your symptoms. Your caregiver also will ask to provide a urine sample. The urine sample will be tested for bacteria and white blood cells. White blood cells are made by your body to help fight infection.  TREATMENT   Typically, UTIs can be treated with medication. Because most UTIs are caused by a bacterial infection, they usually can be treated with the use of antibiotics. The choice of antibiotic and length of treatment depend on your symptoms and the type of bacteria causing your infection.  HOME CARE INSTRUCTIONS   If you were prescribed antibiotics, take them exactly as your caregiver instructs you. Finish the medication even if you feel better after you  have only taken some of the medication.   Drink enough water and fluids to keep your urine clear or pale yellow.   Avoid caffeine, tea, and carbonated beverages. They tend to irritate your bladder.   Empty your bladder often. Avoid holding urine for long periods of time.   Empty your bladder before and after sexual intercourse.   After a bowel movement, women should cleanse from front to back. Use each tissue only once.  SEEK MEDICAL CARE IF:    You have back pain.   You develop a fever.   Your symptoms do not begin to resolve within 3 days.  SEEK IMMEDIATE MEDICAL CARE IF:    You have severe back pain or lower abdominal pain.   You develop chills.   You have nausea or vomiting.   You have continued burning or discomfort with urination.  MAKE SURE YOU:    Understand these instructions.   Will watch your condition.   Will get help right away if you are not doing well or get worse.  Document Released: 01/06/2005 Document Revised: 09/28/2011 Document Reviewed: 05/07/2011  Exit[employer] Patient Information 2014 [employer].

## 2013-04-23 NOTE — Progress Notes (Signed)
HPI  Pt presents to the clinic today with c/o urinary symptoms. This started 3 days ago. She c/o burning with urination and bilateral flank pain. She did have 2 amoxicillin tablets at home which she did take. She has had to take AZO. She does report history of recurrent UTI's. She reports this would be her 3rd UTI since 02/2013. She denies fever, chills, nausea or vomiting.   Review of Systems  Past Medical History  Diagnosis Date  . Dyspnea   . Esophageal reflux   . CAD (coronary artery disease)   . Hypertension   . Crohn's disease   . Allergic rhinitis   . COPD (chronic obstructive pulmonary disease)   . History of kidney stones   . History of shingles   . Trigeminal neuralgia   . Family history of anesthesia complication     " SISTER HAD BAD FEELINGS "  . Anginal pain   . Anxiety     ' JUST FOR MY BREATHING "  . Arthritis     mild in hands  . Hyperlipidemia     Family History  Problem Relation Age of Onset  . Breast cancer Sister   . COPD Sister   . Stroke Mother   . Prostate cancer Father   . Prostate cancer Brother   . Heart attack Brother     History   Social History  . Marital Status: Widowed    Spouse Name: N/A    Number of Children: N/A  . Years of Education: N/A   Occupational History  . PT [employer]    Social History Main Topics  . Smoking status: Former Smoker -- 1.00 packs/day for 30 years    Types: Cigarettes    Quit date: 04/12/1990  . Smokeless tobacco: Never Used  . Alcohol Use: No  . Drug Use: No  . Sexual Activity: No   Other Topics Concern  . Not on file   Social History Narrative  . No narrative on file    Allergies  Allergen Reactions  . Avelox [Moxifloxacin Hcl In Nacl] Shortness Of Breath, Swelling and Rash  . Theophyllines     Slight increased heart rate  . Ciprofloxacin   . Codeine Other (See Comments)    Couldn't breath  . Milk-Related Compounds Other (See Comments)    Hurts stomach..   . Lisinopril Rash     Constitutional: Denies fever, malaise, fatigue, headache or abrupt weight changes.   GU: Pt reports burning sensation and pain with urination. Denies urgency, frequency, blood in urine, odor or discharge. Skin: Denies redness, rashes, lesions or ulcercations.   No other specific complaints in a complete review of systems (except as listed in HPI above).    Objective:   Physical Exam  BP 126/72  Pulse 90  Temp(Src) 97.6 F (36.4 C) (Oral)  Wt 144 lb (65.318 kg)  SpO2 97% Wt Readings from Last 3 Encounters:  04/23/13 144 lb (65.318 kg)  04/16/13 142 lb (64.411 kg)  03/20/13 146 lb (66.225 kg)    General: Appears her stated age, well developed, well nourished in NAD. Cardiovascular: Normal rate and rhythm. S1,S2 noted.  No murmur, rubs or gallops noted. No JVD or BLE edema. No carotid bruits noted. Pulmonary/Chest: Normal effort and positive vesicular breath sounds. No respiratory distress. No wheezes, rales or ronchi noted.  Abdomen: Soft and nontender. Normal bowel sounds, no bruits noted. No distention or masses noted. Liver, spleen and kidneys non palpable. Tender to palpation  over the bladder area. No CVA tenderness.      Assessment & Plan:   Dysuria with bilateral flank pain secondary to possible early UTI:  eRx sent if for Keflex 500 mg QID x 5 days Ok to take AZO OTC if needed Drink plenty of fluids  RTC as needed or if symptoms persist.

## 2013-04-23 NOTE — Addendum Note (Signed)
Addended by: Roena Malady on: 04/23/2013 11:21 AM   Modules accepted: Orders

## 2013-04-24 ENCOUNTER — Ambulatory Visit: Payer: Medicare Other | Admitting: Interventional Cardiology

## 2013-04-25 ENCOUNTER — Encounter: Payer: Self-pay | Admitting: Interventional Cardiology

## 2013-04-25 ENCOUNTER — Ambulatory Visit (INDEPENDENT_AMBULATORY_CARE_PROVIDER_SITE_OTHER): Payer: Medicare HMO | Admitting: Interventional Cardiology

## 2013-04-25 VITALS — BP 124/68 | HR 85 | Ht 64.0 in | Wt 145.1 lb

## 2013-04-25 DIAGNOSIS — I2510 Atherosclerotic heart disease of native coronary artery without angina pectoris: Secondary | ICD-10-CM

## 2013-04-25 DIAGNOSIS — I5032 Chronic diastolic (congestive) heart failure: Secondary | ICD-10-CM | POA: Insufficient documentation

## 2013-04-25 DIAGNOSIS — I5030 Unspecified diastolic (congestive) heart failure: Secondary | ICD-10-CM

## 2013-04-25 DIAGNOSIS — I509 Heart failure, unspecified: Secondary | ICD-10-CM

## 2013-04-25 DIAGNOSIS — I10 Essential (primary) hypertension: Secondary | ICD-10-CM

## 2013-04-25 DIAGNOSIS — R600 Localized edema: Secondary | ICD-10-CM | POA: Insufficient documentation

## 2013-04-25 DIAGNOSIS — R609 Edema, unspecified: Secondary | ICD-10-CM

## 2013-04-25 LAB — BASIC METABOLIC PANEL
BUN: 13 mg/dL (ref 6–23)
CALCIUM: 9.1 mg/dL (ref 8.4–10.5)
CHLORIDE: 105 meq/L (ref 96–112)
CO2: 26 mEq/L (ref 19–32)
Creatinine, Ser: 0.8 mg/dL (ref 0.4–1.2)
GFR: 76.63 mL/min (ref >60.00)
Glucose, Bld: 102 mg/dL — ABNORMAL HIGH (ref 70–99)
Potassium: 4.2 mEq/L (ref 3.5–5.1)
Sodium: 137 mEq/L (ref 135–145)

## 2013-04-25 MED ORDER — POTASSIUM CHLORIDE CRYS ER 20 MEQ PO TBCR: 20.0000 meq | EXTENDED_RELEASE_TABLET | Freq: Every day | ORAL | Status: DC

## 2013-04-25 NOTE — Progress Notes (Signed)
Patient ID: Brandi Cline, female   DOB: 03/27/1931, 80 y.o.   MRN: [id number]    [address] [place], [place]  [address] Phone: [phone] Fax:  [phone]  Date:  04/25/2013   ID:  Brandi AbrahamsMary Ann [place], [place] 11/16/1930, MRN [id number]  PCP:  Ruthe Mannanalia Aron, MD   ASSESSMENT:  1. Chronic diastolic heart failure, suspected but controlled on current diuretic regimen  2. CAD with prior LAD intervention, asymptomatic 3. Respiratory insufficiency related to COPD 4. Suspect pulmonary hypertension with chronic lower extremity edema 5. Hypertension, controlled PLAN:  1. BMET today to assess renal function and potassium 2. No change in the current medical regimen unless laboratory abnormalities are noted 3. She should call if clinical problems 4. We discussed the likely cause of lower extremity edema as being secondary to cor pulmonale/pulmonary hypertension COPD   SUBJECTIVE: Brandi BersMary Ann Burkel is a 80 y.o. female has a remote history of coronary artery disease with LAD intervention in 2002. No recent anginal complaints. She had a terrible year with reference to her respiratory status. She was on high-dose prednisone for a while. She developed significant lower extremity edema. She still now has left greater than right lower extremity edema and is wearing support stockings. She is on chronic home O2 therapy at night when she sleeps. She denies angina, orthopnea, PND, syncope, and palpitations. she is on a relatively high dose of potassium, the same dose of his use when she was on prednisone. Prednisone has been weaned and discontinued.   Wt Readings from Last 3 Encounters:  04/25/13 145 lb 1.9 oz (65.826 kg)  04/23/13 144 lb (65.318 kg)  04/16/13 142 lb (64.411 kg)     Past Medical History  Diagnosis Date  . Dyspnea   . Esophageal reflux   . CAD (coronary artery disease)   . Hypertension   . Crohn's disease   . Allergic rhinitis   . COPD (chronic obstructive pulmonary disease)   .  History of kidney stones   . History of shingles   . Trigeminal neuralgia   . Family history of anesthesia complication     " SISTER HAD BAD FEELINGS "  . Anginal pain   . Anxiety     ' JUST FOR MY BREATHING "  . Arthritis     mild in hands  . Hyperlipidemia     Current Outpatient Prescriptions  Medication Sig Dispense Refill  . albuterol (PROVENTIL HFA;VENTOLIN HFA) 108 (90 BASE) MCG/ACT inhaler Inhale 2 puffs into the lungs every 4 (four) hours as needed for wheezing or shortness of breath.  3 Inhaler  3  . albuterol (PROVENTIL) (2.5 MG/3ML) 0.083% nebulizer solution Take 2.5 mg by nebulization every 4 (four) hours as needed. Shortness of breath      . ALPRAZolam (XANAX) 0.5 MG tablet Take 1 tablet (0.5 mg total) by mouth 2 (two) times daily as needed for anxiety. For anxiety  60 tablet  1  . aspirin 81 MG tablet Take 81 mg by mouth daily.      . budesonide-formoterol (SYMBICORT) 160-4.5 MCG/ACT inhaler Inhale 2 puffs into the lungs 2 (two) times daily.  1 Inhaler  0  . cephALEXin (KEFLEX) 500 MG capsule Take 1 capsule (500 mg total) by mouth 4 (four) times daily.  20 capsule  0  . Cholecalciferol (VITAMIN D-3) 5000 UNITS TABS Take 1 tablet by mouth daily.      . cyanocobalamin (,VITAMIN B-12,) 1000 MCG/ML injection Inject 1 mL (  1,000 mcg total) into the muscle once.  1 mL  11  . estradiol (ESTRACE) 0.5 MG tablet Take 0.25 mg by mouth daily.       . fish oil-omega-3 fatty acids 1000 MG capsule Take 2 g by mouth daily.       . furosemide (LASIX) 40 MG tablet Take 2 tablets (80 mg total) by mouth daily.  30 tablet    . isosorbide mononitrate (IMDUR) 30 MG 24 hr tablet Take 30 mg by mouth 2 (two) times daily.       [name] loratadine (CLARITIN) 10 MG tablet Take 1 tablet (10 mg total) by mouth daily.  30 tablet  5  . losartan (COZAAR) 50 MG tablet Take 50 mg by mouth daily.      . magnesium oxide (MAG-OX) 400 MG tablet Take 800 mg by mouth daily.      . Multiple Vitamin (MULTIVITAMIN) tablet  Take 1 tablet by mouth daily.       . nitroGLYCERIN (NITROSTAT) 0.4 MG SL tablet Place 0.4 mg under the tongue every 5 (five) minutes as needed. Chest pain      . omeprazole (PRILOSEC) 20 MG capsule Take 20 mg by mouth daily.       . potassium chloride SA (K-DUR,KLOR-CON) 20 MEQ tablet Take 1 tablet (20 mEq total) by mouth daily.  45 tablet  11  . simvastatin (ZOCOR) 20 MG tablet Take 20 mg by mouth every evening.      . tiotropium (SPIRIVA) 18 MCG inhalation capsule Place 1 capsule (18 mcg total) into inhaler and inhale daily.  90 capsule  3   No current facility-administered medications for this visit.    Allergies:    Allergies  Allergen Reactions  . Avelox [Moxifloxacin Hcl In Nacl] Shortness Of Breath, Swelling and Rash  . Theophyllines     Slight increased heart rate  . Ciprofloxacin   . Codeine Other (See Comments)    Couldn't breath  . Milk-Related Compounds Other (See Comments)    Hurts stomach..   . Lisinopril Rash    Social History:  The patient  reports that she quit smoking about 23 years ago. Her smoking use included Cigarettes. She has a 30 pack-year smoking history. She has never used smokeless tobacco. She reports that she does not drink alcohol or use illicit drugs.   ROS:  Please see the history of present illness.   Left greater than right lower extremity edema. Cough.   All other systems reviewed and negative.   OBJECTIVE: VS:  BP 124/68  Pulse 85  Ht 5\' 4"  (1.626 m)  Wt 145 lb 1.9 oz (65.826 kg)  BMI 24.90 kg/m2 Well nourished, well developed, in no acute distress, appears her stated age HEENT: normal. Some  cushingoid features Neck: JVD no obvious JVD. Carotid bruit absent  Cardiac:  normal S1, S2; RRR; no murmur Lungs:  clear to auscultation bilaterally, no wheezing, rhonchi or rales Abd: soft, nontender, no hepatomegaly Ext: Edema 2+ to 3+ left lower extremity and trace to 1+ right lower extremity. Pulses difficult to palpate Skin: warm and  dry Neuro:  CNs 2-12 intact, no focal abnormalities noted  EKG:  Normal sinus rhythm       Signed, Darci Needle III, MD 04/25/2013 2:40 PM  Past Medical History  ASHD status post stent placement in LAD, 2002. Mod. LAD with < 505% in RCA and Cfx, 2011.  Hypercholesterolemia  Chronic bronchitis  Crohn's disease status post 20 inches  of terminal ileum and right colon removed in 93 with terminal ileal disease seen on colonoscopy in 2001  Left-sided diverticulosis  GERD  Muscle cramps from hypokalemia  Intermittent vertigo  Chronic urinary tract infection  Mild chronic anxiety  Right lower rib cage pain  Kidney stones  Borderline glaucoma  Mildly abnormal liver function studies  Shingles involving left eye, with postherpetic neuralgia  low-back pain with right lower extremity sciatica, Fall, 2010  Allergic conjunctivitis  COPD, followed by Dr. Jetty Duhamel  sinusitis, January, 2011 [facility] hospitalized for chest pain/dyspnea  Rectal stricture/narrowing   Surgical History

## 2013-04-25 NOTE — Patient Instructions (Signed)
Your physician recommends that you continue on your current medications as directed. Please refer to the Current Medication list given to you today.  Lab today: [facility]  Your physician wants you to follow-up in: 1 year You will receive a reminder letter in the mail two months in advance. If you don't receive a letter, please call our office to schedule the follow-up appointment.  

## 2013-04-26 ENCOUNTER — Encounter: Payer: Self-pay | Admitting: Interventional Cardiology

## 2013-04-27 ENCOUNTER — Telehealth: Payer: Self-pay

## 2013-04-27 MED ORDER — ISOSORBIDE MONONITRATE ER 30 MG PO TB24: 60.0000 mg | ORAL_TABLET | Freq: Every day | ORAL | Status: DC

## 2013-04-27 MED ORDER — FUROSEMIDE 40 MG PO TABS: 80.0000 mg | ORAL_TABLET | Freq: Every day | ORAL | Status: DC

## 2013-04-27 MED ORDER — LOSARTAN POTASSIUM 50 MG PO TABS: 50.0000 mg | ORAL_TABLET | Freq: Every day | ORAL | Status: DC

## 2013-04-27 MED ORDER — NITROGLYCERIN 0.4 MG SL SUBL: 0.4000 mg | SUBLINGUAL_TABLET | SUBLINGUAL | Status: AC | PRN

## 2013-04-27 MED ORDER — SIMVASTATIN 20 MG PO TABS: 20.0000 mg | ORAL_TABLET | Freq: Every evening | ORAL | Status: DC

## 2013-04-27 NOTE — Telephone Encounter (Signed)
pt made aware of lab results.Normal. Keep taking potassium the way she does. pt verbalized understanding.pt [facility]. done.

## 2013-04-27 NOTE — Telephone Encounter (Signed)
Message copied by Jarvis Newcomer on Fri Apr 27, 2013  2:33 PM ------      Message from: Verdis Prime      Created: Thu Apr 26, 2013  8:03 AM       Normal. Keep taking potassium the way she does. Update the med list to reflect ------

## 2013-05-08 ENCOUNTER — Telehealth: Payer: Self-pay | Admitting: Internal Medicine

## 2013-05-08 MED ORDER — TIOTROPIUM BROMIDE MONOHYDRATE 18 MCG IN CAPS: 18.0000 ug | ORAL_CAPSULE | Freq: Every day | RESPIRATORY_TRACT | Status: DC

## 2013-05-08 MED ORDER — BUDESONIDE-FORMOTEROL FUMARATE 160-4.5 MCG/ACT IN AERO: 2.0000 | INHALATION_SPRAY | Freq: Two times a day (BID) | RESPIRATORY_TRACT | Status: DC

## 2013-05-08 NOTE — Telephone Encounter (Signed)
Spoke with pt and advised that [facility]

## 2013-05-11 ENCOUNTER — Telehealth: Payer: Self-pay | Admitting: Family Medicine

## 2013-05-11 NOTE — Telephone Encounter (Signed)
Relevant patient education assigned to patient using Emmi. ° °

## 2013-06-05 ENCOUNTER — Ambulatory Visit: Payer: Medicare Other | Admitting: Family Medicine

## 2013-06-05 ENCOUNTER — Encounter: Payer: Medicare Other | Admitting: Family Medicine

## 2013-06-06 ENCOUNTER — Encounter: Payer: Self-pay | Admitting: Family Medicine

## 2013-06-06 ENCOUNTER — Ambulatory Visit (INDEPENDENT_AMBULATORY_CARE_PROVIDER_SITE_OTHER): Payer: Medicare HMO | Admitting: Family Medicine

## 2013-06-06 VITALS — BP 136/66 | HR 87 | Temp 97.6°F | Ht 63.25 in | Wt 147.0 lb

## 2013-06-06 DIAGNOSIS — J449 Chronic obstructive pulmonary disease, unspecified: Secondary | ICD-10-CM

## 2013-06-06 MED ORDER — AZITHROMYCIN 250 MG PO TABS: ORAL_TABLET | ORAL | Status: DC

## 2013-06-06 NOTE — Assessment & Plan Note (Signed)
Acute exacerbation. Due to drug allergies, will treat with Zpack. Prednisone burst (has rx at home)- advised to start taking it. Call or return to clinic prn if these symptoms worsen or fail to improve as anticipated. The patient indicates understanding of these issues and agrees with the plan.

## 2013-06-06 NOTE — Progress Notes (Signed)
Subjective:   Patient ID: Brandi Cline, female    DOB: [birth year], 80 y.o.   MRN: [id number]  Brandi Cline is a pleasant 80 y.o. year old female new to me who presents to clinic today with Cough and congestion in chest  on 06/06/2013  HPI: Has severe COPD- on 4 L of O2 at baseline.  Taking Symbicort, Spiriva and her albuterol as directed.  Past two days, rescue inhaler not sufficient.  Feels more short of breath.  Having productive cough- greenish yellow.  Feels feverish, no fever.  No CP.  Sees Dr. [name] (pulmonary).  Patient Active Problem List   Diagnosis Date Noted  . Lower extremity edema 04/25/2013  . Diastolic congestive heart failure 04/25/2013  . Hoarseness 12/19/2012  . Hypokalemia 07/08/2012  . Acute respiratory failure with hypoxia 05/13/2012  . HYPERTENSION 03/21/2007  . CAD 03/21/2007  . ALLERGIC RHINITIS 03/21/2007  . COPD 03/21/2007  . Esophageal reflux 03/21/2007  . CROHN'S DISEASE 03/21/2007  . DYSPNEA 03/21/2007   Past Medical History  Diagnosis Date  . Dyspnea   . Esophageal reflux   . CAD (coronary artery disease)   . Hypertension   . Crohn's disease   . Allergic rhinitis   . COPD (chronic obstructive pulmonary disease)   . History of kidney stones   . History of shingles   . Trigeminal neuralgia   . Family history of anesthesia complication     " SISTER HAD BAD FEELINGS "  . Anginal pain   . Anxiety     ' JUST FOR MY BREATHING "  . Arthritis     mild in hands  . Hyperlipidemia    Past Surgical History  Procedure Laterality Date  . Coronary stents    . Lithotripsy    . Ureteral stent placement    . Cholecystectomy    . Total abdominal hysterectomy    . Partial resections large and small bowel for crohn's    . Breast lumps benign     History  Substance Use Topics  . Smoking status: Former Smoker -- 1.00 packs/day for 30 years    Types: Cigarettes    Quit date: 04/12/1990  . Smokeless tobacco: Never Used  . Alcohol Use: No    Family History  Problem Relation Age of Onset  . Breast cancer Sister   . COPD Sister   . Stroke Mother   . Prostate cancer Father   . Prostate cancer Brother   . Heart attack Brother    Allergies  Allergen Reactions  . Avelox [Moxifloxacin Hcl In Nacl] Shortness Of Breath, Swelling and Rash  . Theophyllines     Slight increased heart rate  . Ciprofloxacin   . Codeine Other (See Comments)    Couldn't breath  . Milk-Related Compounds Other (See Comments)    Hurts stomach..   . Lisinopril Rash   Current Outpatient Prescriptions on File Prior to Visit  Medication Sig Dispense Refill  . albuterol (PROVENTIL HFA;VENTOLIN HFA) 108 (90 BASE) MCG/ACT inhaler Inhale 2 puffs into the lungs every 4 (four) hours as needed for wheezing or shortness of breath.  3 Inhaler  3  . albuterol (PROVENTIL) (2.5 MG/3ML) 0.083% nebulizer solution Take 2.5 mg by nebulization every 4 (four) hours as needed. Shortness of breath      . ALPRAZolam (XANAX) 0.5 MG tablet Take 1 tablet (0.5 mg total) by mouth 2 (two) times daily as needed for anxiety. For anxiety  60 tablet  1  .  aspirin 81 MG tablet Take 81 mg by mouth daily.      . budesonide-formoterol (SYMBICORT) 160-4.5 MCG/ACT inhaler Inhale 2 puffs into the lungs 2 (two) times daily.  6 Inhaler  0  . Cholecalciferol (VITAMIN D-3) 5000 UNITS TABS Take 1 tablet by mouth daily.      . cyanocobalamin (,VITAMIN B-12,) 1000 MCG/ML injection Inject 1 mL (1,000 mcg total) into the muscle once.  1 mL  11  . estradiol (ESTRACE) 0.5 MG tablet Take 0.25 mg by mouth daily.       . fish oil-omega-3 fatty acids 1000 MG capsule Take 2 g by mouth daily.       . furosemide (LASIX) 40 MG tablet Take 2 tablets (80 mg total) by mouth daily.  60 tablet  11  . isosorbide mononitrate (IMDUR) 30 MG 24 hr tablet Take 2 tablets (60 mg total) by mouth daily.  60 tablet  11  . loratadine (CLARITIN) 10 MG tablet Take 1 tablet (10 mg total) by mouth daily.  30 tablet  5  . losartan  (COZAAR) 50 MG tablet Take 1 tablet (50 mg total) by mouth daily.  30 tablet  11  . magnesium oxide (MAG-OX) 400 MG tablet Take 800 mg by mouth daily.      . Multiple Vitamin (MULTIVITAMIN) tablet Take 1 tablet by mouth daily.       . nitroGLYCERIN (NITROSTAT) 0.4 MG SL tablet Place 1 tablet (0.4 mg total) under the tongue every 5 (five) minutes as needed. Chest pain  25 tablet  3  . omeprazole (PRILOSEC) 20 MG capsule Take 20 mg by mouth daily.       . potassium chloride SA (K-DUR,KLOR-CON) 20 MEQ tablet Take 1 tablet (20 mEq total) by mouth daily.  45 tablet  11  . simvastatin (ZOCOR) 20 MG tablet Take 1 tablet (20 mg total) by mouth every evening.  30 tablet  11  . tiotropium (SPIRIVA) 18 MCG inhalation capsule Place 1 capsule (18 mcg total) into inhaler and inhale daily.  30 capsule  6   No current facility-administered medications on file prior to visit.   The PMH, PSH, Social History, Family History, Medications, and allergies have [facility], and have been updated if relevant.   Review of Systems    See HPI Objective:    BP 136/66  Pulse 87  Temp(Src) 97.6 F (36.4 C) (Oral)  Ht 5' 3.25" (1.607 m)  Wt 147 lb (66.679 kg)  BMI 25.82 kg/m2  SpO2 96%   Physical Exam  Nursing note and vitals reviewed. Constitutional: She appears well-developed and well-nourished. No distress.  HENT:  O2 per [place]  Pulmonary/Chest: Effort normal. No respiratory distress. She has no decreased breath sounds. She has wheezes in the right lower field. She has rhonchi in the right middle field and the right lower field.  Skin: Skin is warm, dry and intact.  Psychiatric: She has a normal mood and affect. Her speech is normal and behavior is normal. Judgment and thought content normal. Cognition and memory are normal.          Assessment & Plan:   COPD No Follow-up on file.

## 2013-06-06 NOTE — Patient Instructions (Signed)
Great to see you. Take Zpack as directed.  Take prednisone taper as we discussed.  Call me with an update.

## 2013-06-06 NOTE — Progress Notes (Signed)
Pre visit review using our clinic review tool, if applicable. No additional management support is needed unless otherwise documented below in the visit note. 

## 2013-06-12 ENCOUNTER — Telehealth: Payer: Self-pay

## 2013-06-12 NOTE — Telephone Encounter (Signed)
Pt left v/m ; pt has appt on 06/23/13 at 10:15 for medicare wellness exam and pt wants to know if should come fasting and pt also wants to know what will be done during the visit.Please advise.

## 2013-06-12 NOTE — Telephone Encounter (Signed)
Spoke to pt and advised per Dr Aron.  

## 2013-06-12 NOTE — Telephone Encounter (Signed)
No she does not need to be fasting.  Medicare wellness is basically a visit when we update preventative care, check hearing, vision, memory and routine blood work like cholesterol, blood sugar, kidney and liver function.

## 2013-06-13 ENCOUNTER — Telehealth: Payer: Self-pay | Admitting: Family Medicine

## 2013-06-13 ENCOUNTER — Encounter: Payer: Self-pay | Admitting: Family Medicine

## 2013-06-13 ENCOUNTER — Ambulatory Visit (INDEPENDENT_AMBULATORY_CARE_PROVIDER_SITE_OTHER): Payer: Medicare HMO | Admitting: Family Medicine

## 2013-06-13 VITALS — BP 126/64 | HR 66 | Temp 97.9°F | Ht 63.25 in | Wt 143.5 lb

## 2013-06-13 DIAGNOSIS — Z0000 Encounter for general adult medical examination without abnormal findings: Secondary | ICD-10-CM

## 2013-06-13 DIAGNOSIS — N390 Urinary tract infection, site not specified: Secondary | ICD-10-CM

## 2013-06-13 DIAGNOSIS — I10 Essential (primary) hypertension: Secondary | ICD-10-CM

## 2013-06-13 DIAGNOSIS — B0229 Other postherpetic nervous system involvement: Secondary | ICD-10-CM

## 2013-06-13 DIAGNOSIS — K5090 Crohn's disease, unspecified, without complications: Secondary | ICD-10-CM

## 2013-06-13 DIAGNOSIS — I5030 Unspecified diastolic (congestive) heart failure: Secondary | ICD-10-CM

## 2013-06-13 DIAGNOSIS — I509 Heart failure, unspecified: Secondary | ICD-10-CM

## 2013-06-13 DIAGNOSIS — Z23 Encounter for immunization: Secondary | ICD-10-CM

## 2013-06-13 DIAGNOSIS — J449 Chronic obstructive pulmonary disease, unspecified: Secondary | ICD-10-CM

## 2013-06-13 DIAGNOSIS — E785 Hyperlipidemia, unspecified: Secondary | ICD-10-CM | POA: Insufficient documentation

## 2013-06-13 LAB — CBC WITH DIFFERENTIAL/PLATELET
BASOS ABS: 0 10*3/uL (ref 0.0–0.1)
Basophils Relative: 0.3 % (ref 0.0–3.0)
Eosinophils Absolute: 0.1 10*3/uL (ref 0.0–0.7)
Eosinophils Relative: 0.5 % (ref 0.0–5.0)
HCT: 38.4 % (ref 36.0–46.0)
HEMOGLOBIN: 12.8 g/dL (ref 12.0–15.0)
LYMPHS ABS: 1.5 10*3/uL (ref 0.7–4.0)
Lymphocytes Relative: 13.5 % (ref 12.0–46.0)
MCHC: 33.2 g/dL (ref 30.0–36.0)
MCV: 91.3 fl (ref 78.0–100.0)
Monocytes Absolute: 0.8 10*3/uL (ref 0.1–1.0)
Monocytes Relative: 6.9 % (ref 3.0–12.0)
NEUTROS ABS: 9 10*3/uL — AB (ref 1.4–7.7)
Neutrophils Relative %: 78.8 % — ABNORMAL HIGH (ref 43.0–77.0)
Platelets: 210 10*3/uL (ref 150.0–400.0)
RBC: 4.21 Mil/uL (ref 3.87–5.11)
RDW: 15.8 % — AB (ref 11.5–14.6)
WBC: 11.4 10*3/uL — ABNORMAL HIGH (ref 4.5–10.5)

## 2013-06-13 LAB — TSH: TSH: 2.67 u[IU]/mL (ref 0.35–5.50)

## 2013-06-13 LAB — COMPREHENSIVE METABOLIC PANEL
ALT: 69 U/L — AB (ref 0–35)
AST: 62 U/L — AB (ref 0–37)
Albumin: 3.9 g/dL (ref 3.5–5.2)
Alkaline Phosphatase: 134 U/L — ABNORMAL HIGH (ref 39–117)
BILIRUBIN TOTAL: 1.9 mg/dL — AB (ref 0.3–1.2)
BUN: 18 mg/dL (ref 6–23)
CO2: 28 mEq/L (ref 19–32)
Calcium: 9.5 mg/dL (ref 8.4–10.5)
Chloride: 103 mEq/L (ref 96–112)
Creatinine, Ser: 0.8 mg/dL (ref 0.4–1.2)
GFR: 71.24 mL/min (ref >60.00)
Glucose, Bld: 103 mg/dL — ABNORMAL HIGH (ref 70–99)
Potassium: 3.6 mEq/L (ref 3.5–5.1)
Sodium: 139 mEq/L (ref 135–145)
Total Protein: 6.9 g/dL (ref 6.0–8.3)

## 2013-06-13 LAB — LIPID PANEL
CHOL/HDL RATIO: 2
Cholesterol: 167 mg/dL (ref 0–200)
HDL: 93.5 mg/dL (ref >39.00)
LDL CALC: 58 mg/dL (ref 0–99)
Triglycerides: 79 mg/dL (ref 0.0–149.0)
VLDL: 15.8 mg/dL (ref 0.0–40.0)

## 2013-06-13 LAB — HEMOGLOBIN A1C: Hgb A1c MFr Bld: 5.7 % (ref 4.6–6.5)

## 2013-06-13 MED ORDER — ESTRADIOL 0.5 MG PO TABS: 0.2500 mg | ORAL_TABLET | Freq: Every day | ORAL | Status: DC

## 2013-06-13 MED ORDER — OMEPRAZOLE 20 MG PO CPDR: 20.0000 mg | DELAYED_RELEASE_CAPSULE | Freq: Every day | ORAL | Status: DC

## 2013-06-13 MED ORDER — CYANOCOBALAMIN 1000 MCG/ML IJ SOLN: 1000.0000 ug | Freq: Once | INTRAMUSCULAR | Status: DC

## 2013-06-13 MED ORDER — ALPRAZOLAM 0.5 MG PO TABS
0.5000 mg | ORAL_TABLET | Freq: Two times a day (BID) | ORAL | Status: DC | PRN
Start: 2013-06-13 — End: 2013-09-11

## 2013-06-13 MED ORDER — FUROSEMIDE 20 MG PO TABS: 20.0000 mg | ORAL_TABLET | Freq: Every day | ORAL | Status: DC

## 2013-06-13 MED ORDER — FUROSEMIDE 40 MG PO TABS: 80.0000 mg | ORAL_TABLET | Freq: Every day | ORAL | Status: DC

## 2013-06-13 NOTE — Patient Instructions (Addendum)
Check with your insurance to see if they will cover the shingles shot.  I will call you with your lab results.  Please call Dr. [name].

## 2013-06-13 NOTE — Progress Notes (Signed)
80 yo pleasant female here for annual medicare wellness visit. I have personally reviewed the Medicare Annual Wellness questionnaire and have noted 1. The patient's medical and social history 2. Their use of alcohol, tobacco or illicit drugs 3. Their current medications and supplements 4. The patient's functional ability including ADL's, fall risks, home safety risks and hearing or visual             impairment. 5. Diet and physical activities 6. Evidence for depression or mood disorders  End of life wishes discussed and updated in Social History.  Tetanus: 2013 Pneumovax: 2010 Zostovax: never- had severe case of shingles previously Mammogram: 03/2013 Colon Screening: 2006 Bone Density: 02/2013 Eye Doctor: yearly Densit: no (partials)  CAD- with diastolic congestive heart failure- followed by Garnette Scheuermann.  Last saw him in 04/2013.  Note reviewed.  Felt LE edema secondary to pulmonary HTN/COPD.  Has decreased lasix to 20 mg daily now that LE edema has improved.  HLD- on Zocor 20 mg daily.  Due for labs. Lab Results  Component Value Date   CHOL  Value: 121        ATP III CLASSIFICATION:  <200     mg/dL   Desirable  161-096  mg/dL   Borderline High  >=045    mg/dL   High        4/0/9811   HDL 54 04/14/2009   LDLCALC  Value: 48        Total Cholesterol/HDL:CHD Risk Coronary Heart Disease Risk Table                     Men   Women  1/2 Average Risk   3.4   3.3  Average Risk       5.0   4.4  2 X Average Risk   9.6   7.1  3 X Average Risk  23.4   11.0        Use the calculated Patient Ratio above and the CHD Risk Table to determine the patient's CHD Risk.        ATP III CLASSIFICATION (LDL):  <100     mg/dL   Optimal  914-782  mg/dL   Near or Above                    Optimal  130-159  mg/dL   Borderline  956-213  mg/dL   High  >086     mg/dL   Very High 08/17/8467   TRIG 97 04/14/2009   CHOLHDL 2.2 04/14/2009   HTN- has been well controlled.    COPD- oxygen dependent.  On spiriva, symbicort and as  needed albuterol.  Followed by Fannie Knee.    Post herpetic neuralgia- still affects her from time to time- left side of face and eye- takes a xanax when this occurs.  Past Medical History  Diagnosis Date  . Dyspnea   . Esophageal reflux   . CAD (coronary artery disease)   . Hypertension   . Crohn's disease   . Allergic rhinitis   . COPD (chronic obstructive pulmonary disease)   . History of kidney stones   . History of shingles   . Trigeminal neuralgia   . Family history of anesthesia complication     " SISTER HAD BAD FEELINGS "  . Anginal pain   . Anxiety     ' JUST FOR MY BREATHING "  . Arthritis     mild in hands  . Hyperlipidemia  Current Outpatient Prescriptions  Medication Sig Dispense Refill  . albuterol (PROVENTIL HFA;VENTOLIN HFA) 108 (90 BASE) MCG/ACT inhaler Inhale 2 puffs into the lungs every 4 (four) hours as needed for wheezing or shortness of breath.  3 Inhaler  3  . albuterol (PROVENTIL) (2.5 MG/3ML) 0.083% nebulizer solution Take 2.5 mg by nebulization every 4 (four) hours as needed. Shortness of breath      . ALPRAZolam (XANAX) 0.5 MG tablet Take 1 tablet (0.5 mg total) by mouth 2 (two) times daily as needed for anxiety. For anxiety  60 tablet  1  . aspirin 81 MG tablet Take 81 mg by mouth daily.      [name]. azithromycin (ZITHROMAX) 250 MG tablet 2 tabs by mouth on day 1, followed by 1 tab by mouth daily days 2-5  6 tablet  0  . budesonide-formoterol (SYMBICORT) 160-4.5 MCG/ACT inhaler Inhale 2 puffs into the lungs 2 (two) times daily.  6 Inhaler  0  . Cholecalciferol (VITAMIN D-3) 5000 UNITS TABS Take 1 tablet by mouth daily.      . cyanocobalamin (,VITAMIN B-12,) 1000 MCG/ML injection Inject 1 mL (1,000 mcg total) into the muscle once.  1 mL  11  . estradiol (ESTRACE) 0.5 MG tablet Take 0.25 mg by mouth daily.       . fish oil-omega-3 fatty acids 1000 MG capsule Take 2 g by mouth daily.       . furosemide (LASIX) 40 MG tablet Take 2 tablets (80 mg total) by  mouth daily.  60 tablet  11  . isosorbide mononitrate (IMDUR) 30 MG 24 hr tablet Take 2 tablets (60 mg total) by mouth daily.  60 tablet  11  . loratadine (CLARITIN) 10 MG tablet Take 1 tablet (10 mg total) by mouth daily.  30 tablet  5  . losartan (COZAAR) 50 MG tablet Take 1 tablet (50 mg total) by mouth daily.  30 tablet  11  . magnesium oxide (MAG-OX) 400 MG tablet Take 800 mg by mouth daily.      . Multiple Vitamin (MULTIVITAMIN) tablet Take 1 tablet by mouth daily.       . nitroGLYCERIN (NITROSTAT) 0.4 MG SL tablet Place 1 tablet (0.4 mg total) under the tongue every 5 (five) minutes as needed. Chest pain  25 tablet  3  . omeprazole (PRILOSEC) 20 MG capsule Take 20 mg by mouth daily.       . potassium chloride SA (K-DUR,KLOR-CON) 20 MEQ tablet Take 1 tablet (20 mEq total) by mouth daily.  45 tablet  11  . simvastatin (ZOCOR) 20 MG tablet Take 1 tablet (20 mg total) by mouth every evening.  30 tablet  11  . tiotropium (SPIRIVA) 18 MCG inhalation capsule Place 1 capsule (18 mcg total) into inhaler and inhale daily.  30 capsule  6   No current facility-administered medications for this visit.    Allergies  Allergen Reactions  . Avelox [Moxifloxacin Hcl In Nacl] Shortness Of Breath, Swelling and Rash  . Theophyllines     Slight increased heart rate  . Ciprofloxacin   . Codeine Other (See Comments)    Couldn't breath  . Milk-Related Compounds Other (See Comments)    Hurts stomach..   . Lisinopril Rash    Family History  Problem Relation Age of Onset  . Breast cancer Sister   . COPD Sister   . Stroke Mother   . Prostate cancer Father   . Prostate cancer Brother   . Heart attack Brother  History   Social History  . Marital Status: Widowed    Spouse Name: N/A    Number of Children: N/A  . Years of Education: N/A   Occupational History  . PT [employer]    Social History Main Topics  . Smoking status: Former Smoker -- 1.00 packs/day for 30 years    Types:  Cigarettes    Quit date: 04/12/1990  . Smokeless tobacco: Never Used  . Alcohol Use: No  . Drug Use: No  . Sexual Activity: No   Other Topics Concern  . Not on file   Social History Narrative  . No narrative on file    ROS: See HPI  PE: BP 126/64  Pulse 66  Temp(Src) 97.9 F (36.6 C) (Oral)  Ht 5' 3.25" (1.607 m)  Wt 143 lb 8 oz (65.091 kg)  BMI 25.21 kg/m2  SpO2 95%  Wt Readings from Last 3 Encounters:  06/13/13 143 lb 8 oz (65.091 kg)  06/06/13 147 lb (66.679 kg)  04/25/13 145 lb 1.9 oz (65.826 kg)      General:  Well-developed,well-nourished,in no acute distress; alert,appropriate and cooperative throughout examination Head:  normocephalic and atraumatic.   Eyes:  vision grossly intact, pupils equal, pupils round, and pupils reactive to light.   Ears:  R ear normal and L ear normal.   Nose:  no external deformity.   Mouth:  good dentition.   Neck:  No deformities, masses, or tenderness noted.  Lungs:  Normal respiratory effort, chest expands symmetrically. Lungs are clear to auscultation, no crackles or wheezes. Heart:  Normal rate and regular rhythm. S1 and S2 normal without gallop, murmur, click, rub or other extra sounds. Abdomen:  Bowel sounds positive,abdomen soft and non-tender without masses, organomegaly or hernias noted. Msk:  No deformity or scoliosis noted of thoracic or lumbar spine.   Extremities:  No clubbing, cyanosis, edema, or deformity noted with normal full range of motion of all joints.   Neurologic:  alert & oriented X3 and gait normal.   Skin:  Intact without suspicious lesions or rashes Cervical Nodes:  No lymphadenopathy noted Axillary Nodes:  No palpable lymphadenopathy Psych:  Cognition and judgment appear intact. Alert and cooperative with normal attention span and concentration. No apparent delusions, illusions, hallucinations      Assessment and Plan:

## 2013-06-13 NOTE — Assessment & Plan Note (Signed)
On Zocor.  Check labs today.

## 2013-06-13 NOTE — Assessment & Plan Note (Signed)
The patients weight, height, BMI and visual acuity have been recorded in the chart I have made referrals, counseling and provided education to the patient based review of the above and I have provided the pt with a written personalized care plan for preventive services.  

## 2013-06-13 NOTE — Assessment & Plan Note (Signed)
Improved with Estrace.

## 2013-06-13 NOTE — Assessment & Plan Note (Signed)
Well controlled. No changes. 

## 2013-06-13 NOTE — Addendum Note (Signed)
Addended by: Desmond Dike on: 06/13/2013 11:16 AM   Modules accepted: Orders

## 2013-06-13 NOTE — Assessment & Plan Note (Signed)
Followed by Dr .Garnette ScheuermannHank Cline.  Will check renal function today since she is taking lasix.

## 2013-06-13 NOTE — Telephone Encounter (Signed)
Relevant patient education assigned to patient using Emmi. ° °

## 2013-06-13 NOTE — Assessment & Plan Note (Signed)
O2 dependent. Continue current meds. Followed by Dr. [name]- Prevnar given today.

## 2013-06-13 NOTE — Progress Notes (Signed)
Pre visit review using our clinic review tool, if applicable. No additional management support is needed unless otherwise documented below in the visit note. 

## 2013-06-14 ENCOUNTER — Encounter: Payer: Self-pay | Admitting: Family Medicine

## 2013-06-19 NOTE — Telephone Encounter (Signed)
Pt left v/m requesting recent lab results; pt request cb at [phone].

## 2013-06-19 NOTE — Telephone Encounter (Signed)
Spoke to pt and informed her of results. She states that her daughter signed her up for mychart, but she did not have access to them

## 2013-06-21 ENCOUNTER — Telehealth: Payer: Self-pay | Admitting: Family Medicine

## 2013-06-21 NOTE — Telephone Encounter (Signed)
Pt dropped off handi-cap sticker form. Placed it in Dr. Elmer SowAron's inboc on her desk.

## 2013-07-03 ENCOUNTER — Encounter: Payer: Self-pay | Admitting: Internal Medicine

## 2013-07-03 ENCOUNTER — Ambulatory Visit (INDEPENDENT_AMBULATORY_CARE_PROVIDER_SITE_OTHER): Payer: Medicare HMO | Admitting: Internal Medicine

## 2013-07-03 VITALS — BP 114/68 | HR 83 | Ht 64.0 in | Wt 146.2 lb

## 2013-07-03 DIAGNOSIS — R609 Edema, unspecified: Secondary | ICD-10-CM

## 2013-07-03 DIAGNOSIS — J449 Chronic obstructive pulmonary disease, unspecified: Secondary | ICD-10-CM

## 2013-07-03 DIAGNOSIS — R600 Localized edema: Secondary | ICD-10-CM

## 2013-07-03 MED ORDER — THEOPHYLLINE ER 200 MG PO CP24: ORAL_CAPSULE | ORAL | Status: DC

## 2013-07-03 MED ORDER — LORAZEPAM 0.5 MG PO TABS: ORAL_TABLET | ORAL | Status: DC

## 2013-07-03 MED ORDER — BUDESONIDE-FORMOTEROL FUMARATE 160-4.5 MCG/ACT IN AERO: 2.0000 | INHALATION_SPRAY | Freq: Two times a day (BID) | RESPIRATORY_TRACT | Status: DC

## 2013-07-03 NOTE — Patient Instructions (Signed)
Script sent for theophylline 200 mg- Try 1/2-1 tab, once or twice daily  Script for Lorazepam to try for nervous shortness of breath instead of xanax/ alprazolam  Sample Anoro Ellipta inhaler   1 puff, once daily maintenance  We are stopping Spiriva as ineffective

## 2013-07-03 NOTE — Progress Notes (Signed)
Subjective:    Patient ID: Brandi Cline, female    DOB: 10-09-1930, 80 y.o.   MRN: [id number] Acute visit- Dr Shelle Iron 09/28/11- The patient comes in today for an acute sick visit.  She has known significant COPD, and is usually followed by Dr. [name].  She gives a 2 to three-day history of increasing shortness of breath, and this culminated in severe shortness of breath this morning.  She is currently a little better after using her morning bronchodilators and her neb treatment.  She has no significant cough, mucus, or congestion.  She does feel a chest heaviness, especially on the left side.  She has a history of coronary disease with stents, but thinks that chest discomfort is different from her usual angina.  Surprisingly, her oxygen saturations today are excellent.  12/06/11 - 80 yoF followed for COPD, complicated by allergic rhinitis, GERD, Crohns Disease, CAD/ stents Patient states had pneumonia/ hosp/ heart cath( ok w/ no new stents) in June. States breathing is a little worse since last office visit. States wears oxygen on 2L as needed. c/o sob, wheezing, and chest tightness every now and then. Denies chest pain and cough. Occasional scant clear mucus. Denies chest pain. Notices dyspnea mainly with exertion, worse in humid weather. CXR 10/08/11-reviewed with her IMPRESSION:  COPD/chronic changes. No active disease.  Original Report Authenticated By: Brandi Cline, M.D.   03/17/12- 80 yoF followed for COPD, complicated by allergic rhinitis, GERD, Crohns Disease, CAD/ stents FOLLOWS FOR: feels like symibcort and Spiriva not helping anymore-would like something different and cheaper Sister here 2 URI's since August treated by PCP. Needed prednisone.  Feels Symbicort wear off before 12 hour next dose. Wearing O2 more and using neb twice daily.  Little cough- scant clear mucus. Aware of angina despite isorbide. Feet swell- varies, not new or progressive. CXR 10/07/11- COPD  04/27/12-  81 yoF  followed for COPD, complicated by allergic rhinitis, GERD, Crohns Disease, CAD/ stents FOLLOWS FOR: Increased SOB today-unsure of cause(used nebulizer before OV-not much  difference), did not hear any wheezing. Denies any cough or chest congestion Often feels tight at night. Aware of increased fluid retention occasionally. Denies chest pain, infection, wheeze or cough. Last visit we gave sample Breo ellipta, which she blames for fluid retention. Continues oxygen 2 L/Advanced for sleep and as needed  12/19/12- 81 yoF former smoker followed for COPD, complicated by allergic rhinitis, GERD, Crohns Disease, CAD/ stents     Family here. ACUTE:  Increased SOB, tightness that travels around to back and some wheezing.  Sore throat in am and  scratchy all day Continues oxygen 3 L/Advanced for sleep and as needed. She has been more dyspneic over past year. We reviewed Chest CT from the spring, noting it was done because of dyspnea then. Hosp 3/26-07/12/12 for AECOPD with NAD on CXR.  Now 3-4 weeks variable hoarseness- on magic mouthwash and prednisone taper. Some difficulty swallowing.  No cough, scant deep yellow sputum, some tight. No pain, blood, fever, palpitation. Easy DOE needing 3LO2 rest, 4-5L exertion in home. CT chest 07/05/12 IMPRESSION:  . Stable hyperinflation and chronic interstitial prominence. Again  noted left basilar scarring. No acute infiltrate or pulmonary  edema.  Original Report Authenticated By: Brandi Cline, M.D. CXR 07/10/12 IMPRESSION:  No acute cardiopulmonary abnormality seen. Chronic findings as  described above.  Original Report Authenticated By: Brandi Raider., M.D.  01/30/13- 28 yoF former smoker followed for COPD, complicated by allergic rhinitis, GERD, Crohns Disease, CAD/ stents  Family here. FOLLOWS FOR: "weak as water"; swelling in feet, staying tired. Good and bad days Weight gain 10 pounds in the past month. Ankles swell. Little cough, scant yellow. Pain mid  thoracic spine. Continues O2 4L/ Advanced. Theophylline was no help and caused palpitations, so quit. Last prednisone 2 weeks ago. ENT/Dr. Jearld Cline saw her in September and diagnosed esophageal reflux and thrush. He doubled her acid blocker.  03/20/13- [age] yoF former smoker followed for COPD, complicated by allergic rhinitis, GERD, Crohns Disease, CAD/ stents   Daughter here FOLLOWS FOR: Breathing is unchanged. Reports SOB and coughing. Denies chest tightness or wheezing.   Brandi Cline is treating with Lasix. She is off of prednisone. Morning cough with clear mucus. Using Flutter device for pulmonary toilet. Medications discussed. Hosp for AECOPD 05/13/12- 05/15/12 CXR 01/30/13 IMPRESSION:  Bibasilar linear opacities, consistent with atelectasis or scarring.  Emphysematous changes.  Electronically Signed  By: Brandi DillingMargaret Yacobozzi M.D.  On: 01/30/2013 16:54  07/03/13- [age] yoF former smoker followed for COPD, complicated by allergic rhinitis, GERD, Crohns Disease, CAD/ stents   Daughter here FOLLOWS FOR: Symbicort works well for patient about 4-5 hours and then uses rescue inhaler; using neb treatments as needed; but unsure if Spiriva is actually helping. Wonders if she can try Brandi Cline again as she has had past 2 days without any side effects. Now finishing prednisone and Z-Pak from primary physician. [facility]. Using either nebulizer or Proair once or twice daily.  ROS-see HPI Constitutional:   No-   weight loss, night sweats, fevers, chills, fatigue, lassitude. HEENT:   No-  headaches, +difficulty swallowing, tooth/dental problems, +sore throat,       No-  sneezing, itching, ear ache, nasal congestion, post nasal drip,  CV:  No-   chest pain, orthopnea, PND, +swelling in lower extremities, anasarca, dizziness, palpitations Resp: +  shortness of breath with exertion or at rest.             No-  productive cough, no- non-productive cough,  No- coughing up of blood.              No-  change in color of mucus.  No- wheezing.   Skin: No-   rash or lesions. GI:  + heartburn, indigestion, no-abdominal pain, nausea, vomiting,  GU:  MS:  No-   joint pain or swelling.  + back pain Neuro-     nothing unusual Psych:  No- change in mood or affect. No depression or anxiety.  No memory loss.  Objective:  OBJ- Physical Exam General- Alert, Oriented, Affect-appropriate, Distress- none acute, wheelchair, O2 3L Skin- rash-none, lesions- none, excoriation- none Lymphadenopathy- none Head- atraumatic            Eyes- Gross vision intact, PERRLA, conjunctivae and secretions clear            Ears- +Hearing aids            Nose- Clear, no-Septal dev, mucus, polyps, erosion, perforation             Throat- Mallampati II , mucosa clear , drainage- none, tonsils- atrophic. +Hoarse Neck- flexible , trachea midline, no stridor , thyroid nl, carotid no bruit Chest - symmetrical excursion , unlabored           Heart/CV- RRR , no murmur , no gallop  , no rub, nl s1 s2                           -  JVD-+ 1, edema- 1+, stasis changes- none, varices- none           Lung- decreased breath sounds, clear, wheeze- none, cough- none ,                                             dullness-none, rub-none           Chest wall-  Abd-  Br/ Gen/ Rectal- Not done, not indicated Extrem- cyanosis- none, clubbing, none, atrophy- none, strength- nl. Neg Homan's Neuro- grossly intact to observation  Assessment & Plan:

## 2013-07-18 ENCOUNTER — Other Ambulatory Visit: Payer: Self-pay

## 2013-07-18 MED ORDER — POTASSIUM CHLORIDE CRYS ER 20 MEQ PO TBCR: EXTENDED_RELEASE_TABLET | ORAL | Status: DC

## 2013-07-25 NOTE — Assessment & Plan Note (Signed)
Recent exacerbation improved now after treatment. Needs medications refilled. Plan-stop Spiriva. Start theophylline. Sample Anoro

## 2013-07-25 NOTE — Assessment & Plan Note (Signed)
Minimal, stable 

## 2013-08-28 ENCOUNTER — Telehealth: Payer: Self-pay

## 2013-08-28 NOTE — Telephone Encounter (Signed)
Spoke to Brandi Cline who approved estradiol; to receive incoming fax confirming coverage

## 2013-08-28 NOTE — Telephone Encounter (Signed)
Approval received and sent for scanning. Spoke to pt and informed her of approval

## 2013-08-28 NOTE — Telephone Encounter (Signed)
Brandi Cline with [name] coverage request question for [place] estradiol; [name].

## 2013-09-06 ENCOUNTER — Ambulatory Visit (INDEPENDENT_AMBULATORY_CARE_PROVIDER_SITE_OTHER)
Admission: RE | Admit: 2013-09-06 | Discharge: 2013-09-06 | Disposition: A | Discharge status: Home / Self Care | Payer: Medicare HMO | Source: Ambulatory Visit | Attending: Family Medicine | Admitting: Family Medicine

## 2013-09-06 ENCOUNTER — Encounter: Payer: Self-pay | Admitting: Family Medicine

## 2013-09-06 ENCOUNTER — Ambulatory Visit (INDEPENDENT_AMBULATORY_CARE_PROVIDER_SITE_OTHER): Payer: Medicare HMO | Admitting: Family Medicine

## 2013-09-06 VITALS — BP 120/58 | HR 88 | Temp 97.6°F | Ht 64.0 in | Wt 147.0 lb

## 2013-09-06 DIAGNOSIS — J4489 Other specified chronic obstructive pulmonary disease: Secondary | ICD-10-CM

## 2013-09-06 DIAGNOSIS — M25529 Pain in unspecified elbow: Secondary | ICD-10-CM

## 2013-09-06 DIAGNOSIS — J449 Chronic obstructive pulmonary disease, unspecified: Secondary | ICD-10-CM

## 2013-09-06 DIAGNOSIS — M7500 Adhesive capsulitis of unspecified shoulder: Secondary | ICD-10-CM

## 2013-09-06 DIAGNOSIS — M25522 Pain in left elbow: Secondary | ICD-10-CM

## 2013-09-06 DIAGNOSIS — M25519 Pain in unspecified shoulder: Secondary | ICD-10-CM

## 2013-09-06 DIAGNOSIS — M25511 Pain in right shoulder: Secondary | ICD-10-CM

## 2013-09-06 DIAGNOSIS — M7501 Adhesive capsulitis of right shoulder: Secondary | ICD-10-CM

## 2013-09-06 DIAGNOSIS — J9610 Chronic respiratory failure, unspecified whether with hypoxia or hypercapnia: Secondary | ICD-10-CM

## 2013-09-06 NOTE — Progress Notes (Signed)
[address] [place] [address] Phone: [phone] Fax: [phone]  Patient ID: Brandi Cline MRN: [id number], DOB: June 21, 1930, 80 y.o. Date of Encounter: 09/06/2013  Primary Physician:  [name], MD   Chief Complaint: Shoulder Pain and Elbow Pain   Subjective:   History of Present Illness:  Brandi Cline is a 80 y.o. very pleasant female patient who presents with the following:  A pleasant elderly patient with advanced chronic obstructive pulmonary disease and chronic respiratory failure on oxygen, also with coronary disease and congestive heart failure who presents with an 18 month history of right-sided shoulder pain and intermittent LEFT elbow pain for multiple years.  R shoulder, hurt about 1 1/2 years ago bowling and and it hurt a lot at the time. Felt like the arm came out of the socket. This was an acute, approximately injury, and the patient had an almost immediate change in her status after this injury. She did have pain acutely in pain and difficulty with moving and manipulating the shoulder. Has had 2 shots of cortisone - it is unclear to me if these were subacromial or intra-articular injections.  Sometimes all the way down to the elbow and up through the upper arm and a T-shirt distribution.  With LEFT elbow, the patient had an injury when she was 80 years old, 75 years ago, where she almost severed her lower arm at the elbow. Since then, she has had intermittent problems with that elbow. Now she has a loss of motion with terminal movements and some intermittent ongoing pain.   Past Medical History, Surgical History, Social History, Family History, Problem List, Medications, and Allergies have been reviewed and updated if relevant.  Review of Systems:  GEN: No fevers, chills. Nontoxic. Primarily MSK c/o today. MSK: Detailed in the HPI GI: tolerating PO intake without difficulty Neuro: No numbness, parasthesias, or tingling associated. Otherwise the  pertinent positives of the ROS are noted above.   Objective:   Physical Examination: BP 120/58  Pulse 88  Temp(Src) 97.6 F (36.4 C) (Oral)  Ht 5\' 4"  (1.626 m)  Wt 147 lb (66.679 kg)  BMI 25.22 kg/m2   GEN: WDWN, NAD, Non-toxic, Alert & Oriented x 3 HEENT: Atraumatic, Normocephalic.  Ears and Nose: No external deformity. EXTR: No clubbing/cyanosis/edema NEURO: Normal gait.  PSYCH: Normally interactive. Conversant. Not depressed or anxious appearing.  Calm demeanor.    RIGHT shoulder: Nontender along the clavicle. Mildly tender at the a.c. Joint. Mildly tender in the bicipital groove. Speed's test and Yergason's test are negative.  She lacks about 10 of abduction and flexion compared to the contralateral side. She lacks 40 of external range of motion compared to the contralateral side, when her RIGHT arm is in 90 of abduction. She lacks 85 of internal range of motion compared to the contralateral side in 90 of abduction.  Abduction strength is 4/5. All other strength is 5/5. The Other special testing of the shoulder is equivocal.  LEFT elbow lacks approximately 10 of extension. He also lacks 15 of flexion. Very mild loss of terminal pronation and supination. All of these cause some pain. Strength is preserved.  Neurovascularly intact.  Radiology: Dg Shoulder Right  09/06/2013   CLINICAL DATA:  Right shoulder pain.  EXAM: RIGHT SHOULDER - 2+ VIEW  COMPARISON:  None.  FINDINGS: There is no evidence of fracture or dislocation. Visualized ribs appear normal. There is no evidence of arthropathy or other focal bone abnormality. Soft tissues are unremarkable.  IMPRESSION: Normal right shoulder.   Electronically Signed   By: Roque LiasJames  Green M.D.   On: 09/06/2013 14:52    Assessment & Plan:   Adhesive capsulitis of right shoulder - Plan: Ambulatory referral to Physical Therapy  Shoulder pain, right - Plan: DG Shoulder Right, Ambulatory referral to Physical Therapy  Elbow pain,  left  Chronic respiratory failure, O2 dependent  COPD  By history, patient likely had a rotator cuff tear 18 months ago, and then she developed a secondary adhesive capsulitis. All this was reviewed in great detail with the patient. She has a loss of motion in greater than 3 planes of movement. She is a very poor operative candidate, and additional evaluation of her rotator cuff would be harmful in my opinion. We are going to try to get her more pain free, get her strength and range of motion improved.  LEFT elbow is more challenging. 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 These are challenging cases, but I think that she may be able to get some motion back, so we'll ask the physical therapist to work with her elbow as well as her shoulder.  Intrarticular Shoulder Injection, RIGHT Verbal consent was obtained from the patient. Risks including infection explained and contrasted with benefits and alternatives. Patient prepped with Chloraprep and Ethyl Chloride used for anesthesia. An intraarticular shoulder injection was performed using the posterior approach. The patient tolerated the procedure well and had decreased pain post injection. No complications. Injection: 8 cc of Lidocaine 1% and Depo-Medrol 80 mg. Needle: 22 gauge   Follow-up: 2 months Unless noted above, the patient is to follow-up if symptoms worsen. Red flags were reviewed with the patient.  New Prescriptions   No medications on file   Orders Placed This Encounter  Procedures  . DG Shoulder Right  . Ambulatory referral to Physical Therapy    Signed,  Karleen HampshireSpencer T. Chudney Scheffler, MD, CAQ Sports Medicine   Patient's Medications  New Prescriptions   No medications on file  Previous Medications   ALBUTEROL (PROVENTIL HFA;VENTOLIN HFA) 108 (90 BASE) MCG/ACT INHALER    Inhale 2 puffs into the lungs every 4 (four) hours as needed for wheezing or shortness of breath.   ALBUTEROL (PROVENTIL) (2.5 MG/3ML) 0.083%  NEBULIZER SOLUTION    Take 2.5 mg by nebulization every 4 (four) hours as needed. Shortness of breath   ALPRAZOLAM (XANAX) 0.5 MG TABLET    Take 1 tablet (0.5 mg total) by mouth 2 (two) times daily as needed for anxiety. For anxiety   ASPIRIN 81 MG TABLET    Take 81 mg by mouth daily.   BUDESONIDE-FORMOTEROL (SYMBICORT) 160-4.5 MCG/ACT INHALER    Inhale 2 puffs into the lungs 2 (two) times daily.   CHOLECALCIFEROL (VITAMIN D-3) 5000 UNITS TABS    Take 1 tablet by mouth daily.   CYANOCOBALAMIN (,VITAMIN B-12,) 1000 MCG/ML INJECTION    Inject 1 mL (1,000 mcg total) into the muscle once.   ESTRADIOL (ESTRACE) 0.5 MG TABLET    Take 0.5 tablets (0.25 mg total) by mouth daily.   FISH OIL-OMEGA-3 FATTY ACIDS 1000 MG CAPSULE    Take 2 g by mouth daily.    FUROSEMIDE (LASIX) 20 MG TABLET    Take 1 tablet (20 mg total) by mouth daily.   ISOSORBIDE MONONITRATE (IMDUR) 30 MG 24 HR TABLET    Take 2 tablets (60 mg total) by mouth daily.   LORATADINE (CLARITIN) 10 MG TABLET    Take 1 tablet (  10 mg total) by mouth daily.   LORAZEPAM (ATIVAN) 0.5 MG TABLET    1 twice daily if needed   LOSARTAN (COZAAR) 50 MG TABLET    Take 1 tablet (50 mg total) by mouth daily.   MAGNESIUM OXIDE (MAG-OX) 400 MG TABLET    Take 800 mg by mouth daily.   MULTIPLE VITAMIN (MULTIVITAMIN) TABLET    Take 1 tablet by mouth daily.    NITROGLYCERIN (NITROSTAT) 0.4 MG SL TABLET    Place 1 tablet (0.4 mg total) under the tongue every 5 (five) minutes as needed. Chest pain   OMEPRAZOLE (PRILOSEC) 20 MG CAPSULE    Take 1 capsule (20 mg total) by mouth daily.   POTASSIUM CHLORIDE SA (K-DUR,KLOR-CON) 20 MEQ TABLET    Alternate taking 2 one day then 1 the next   SIMVASTATIN (ZOCOR) 20 MG TABLET    Take 20 mg by mouth. Monday, Wednesday, and Friday   THEOPHYLLINE (THEO-24) 200 MG 24 HR CAPSULE    1 twice daily with food  Modified Medications   No medications on file  Discontinued Medications   No medications on file

## 2013-09-06 NOTE — Patient Instructions (Signed)
REFERRALS TO SPECIALISTS, SPECIAL TESTS (MRI, CT, ULTRASOUNDS)  GO THE WAITING ROOM AND TELL CHECK IN YOU NEED HELP WITH A REFERRAL. Either MAR[facility] or LINDA will help you set it up.  If it is between 1-2 PM they may be at lunch.  After 5 PM, they will likely be at home.  They will call you, so please make sure the office has your correct phone number.  Referrals sometimes can be done same day if urgent, but others can take 2 or 3 days to get an appointment. Starting in 2015, many of the new Medicare insurance plans and Affordable Care Act (Obamacare) Health plans offered take much longer for referrals. They have added additional paperwork and steps.  MRI's and CT's can take up to a week for the test. (Emergencies like strokes take precedence. I will tell you if you have an emergency.)   If your referral is to an in-network [facility] office, their office may contact you directly prior to our office reaching you.  -- Examples: [facility], [facility], [facility], [facility], [facility], and many more.  Specialist appointment times vary a great deal, mostly on the specialist's schedule and if they have openings. -- Our office tries to get you in as fast as possible. -- Some specialists have very long wait times. (Example. Dermatology. Usually months) -- If you have a true emergency like new cancer, we work to get you in ASAP.   

## 2013-09-06 NOTE — Progress Notes (Signed)
Pre visit review using our clinic review tool, if applicable. No additional management support is needed unless otherwise documented below in the visit note. 

## 2013-09-07 DIAGNOSIS — J9610 Chronic respiratory failure, unspecified whether with hypoxia or hypercapnia: Secondary | ICD-10-CM | POA: Insufficient documentation

## 2013-09-11 ENCOUNTER — Other Ambulatory Visit: Payer: Self-pay

## 2013-09-11 MED ORDER — ALPRAZOLAM 0.5 MG PO TABS: 0.5000 mg | ORAL_TABLET | Freq: Two times a day (BID) | ORAL | Status: DC | PRN

## 2013-09-11 NOTE — Telephone Encounter (Signed)
Pt request alprazolam for 90 day rx due to expense of med; pt said 90 day rx will be a lot less expensive than 30 day rx. Pt said she has COPD which causes her to be anxious and pt takes alprazolam 0.5 mg twice a day. [place]

## 2013-09-12 NOTE — Telephone Encounter (Signed)
Spoke to pt and informed her Rx has been called in to requested pharmacy 

## 2013-09-18 ENCOUNTER — Encounter: Payer: Self-pay | Admitting: Internal Medicine

## 2013-09-18 ENCOUNTER — Ambulatory Visit (INDEPENDENT_AMBULATORY_CARE_PROVIDER_SITE_OTHER): Payer: Medicare HMO | Admitting: Internal Medicine

## 2013-09-18 VITALS — BP 126/58 | HR 87 | Temp 97.9°F | Wt 146.0 lb

## 2013-09-18 VITALS — BP 140/58 | HR 86 | Ht 64.0 in | Wt 146.8 lb

## 2013-09-18 DIAGNOSIS — J439 Emphysema, unspecified: Secondary | ICD-10-CM

## 2013-09-18 DIAGNOSIS — R300 Dysuria: Secondary | ICD-10-CM

## 2013-09-18 DIAGNOSIS — N390 Urinary tract infection, site not specified: Secondary | ICD-10-CM

## 2013-09-18 DIAGNOSIS — M545 Low back pain, unspecified: Secondary | ICD-10-CM

## 2013-09-18 DIAGNOSIS — J9611 Chronic respiratory failure with hypoxia: Secondary | ICD-10-CM

## 2013-09-18 DIAGNOSIS — R0902 Hypoxemia: Secondary | ICD-10-CM

## 2013-09-18 DIAGNOSIS — J9610 Chronic respiratory failure, unspecified whether with hypoxia or hypercapnia: Secondary | ICD-10-CM

## 2013-09-18 DIAGNOSIS — J438 Other emphysema: Secondary | ICD-10-CM

## 2013-09-18 LAB — POCT URINALYSIS DIPSTICK
Bilirubin, UA: NEGATIVE
Glucose, UA: NEGATIVE
Ketones, UA: NEGATIVE
Nitrite, UA: NEGATIVE
PROTEIN UA: NEGATIVE
Spec Grav, UA: <=1.005
Urobilinogen, UA: 0.2
pH, UA: 6

## 2013-09-18 MED ORDER — NITROFURANTOIN MONOHYD MACRO 100 MG PO CAPS: 100.0000 mg | ORAL_CAPSULE | Freq: Two times a day (BID) | ORAL | Status: DC

## 2013-09-18 NOTE — Patient Instructions (Addendum)
Urinary Tract Infection  Urinary tract infections (UTIs) can develop anywhere along your urinary tract. Your urinary tract is your body's drainage system for removing wastes and extra water. Your urinary tract includes two kidneys, two ureters, a bladder, and a urethra. Your kidneys are a pair of bean-shaped organs. Each kidney is about the size of your fist. They are located below your ribs, one on each side of your spine.  CAUSES  Infections are caused by microbes, which are microscopic organisms, including fungi, viruses, and bacteria. These organisms are so small that they can only be seen through a microscope. Bacteria are the microbes that most commonly cause UTIs.  SYMPTOMS   Symptoms of UTIs may vary by age and gender of the patient and by the location of the infection. Symptoms in young women typically include a frequent and intense urge to urinate and a painful, burning feeling in the bladder or urethra during urination. Older women and men are more likely to be tired, shaky, and weak and have muscle aches and abdominal pain. A fever may mean the infection is in your kidneys. Other symptoms of a kidney infection include pain in your back or sides below the ribs, nausea, and vomiting.  DIAGNOSIS  To diagnose a UTI, your caregiver will ask you about your symptoms. Your caregiver also will ask to provide a urine sample. The urine sample will be tested for bacteria and white blood cells. White blood cells are made by your body to help fight infection.  TREATMENT   Typically, UTIs can be treated with medication. Because most UTIs are caused by a bacterial infection, they usually can be treated with the use of antibiotics. The choice of antibiotic and length of treatment depend on your symptoms and the type of bacteria causing your infection.  HOME CARE INSTRUCTIONS   If you were prescribed antibiotics, take them exactly as your caregiver instructs you. Finish the medication even if you feel better after you  have only taken some of the medication.   Drink enough water and fluids to keep your urine clear or pale yellow.   Avoid caffeine, tea, and carbonated beverages. They tend to irritate your bladder.   Empty your bladder often. Avoid holding urine for long periods of time.   Empty your bladder before and after sexual intercourse.   After a bowel movement, women should cleanse from front to back. Use each tissue only once.  SEEK MEDICAL CARE IF:    You have back pain.   You develop a fever.   Your symptoms do not begin to resolve within 3 days.  SEEK IMMEDIATE MEDICAL CARE IF:    You have severe back pain or lower abdominal pain.   You develop chills.   You have nausea or vomiting.   You have continued burning or discomfort with urination.  MAKE SURE YOU:    Understand these instructions.   Will watch your condition.   Will get help right away if you are not doing well or get worse.  Document Released: 01/06/2005 Document Revised: 09/28/2011 Document Reviewed: 05/07/2011  Exit[employer] Patient Information 2014 [employer].

## 2013-09-18 NOTE — Patient Instructions (Signed)
We are stopping theophylline  Neb Perforomist now. Samples of Perforomist # 4- try 1 by neb twice daily to see if it lasts longer. Instead of albuterol

## 2013-09-18 NOTE — Progress Notes (Signed)
HPI  Pt presents to the clinic today with c/o dysuria and low back pain. She reports this started last night. She denies fever, chills, nausea or vomiting. She has not taken anything OTC. She has had UTI's in the past and reports this feels the same.   Review of Systems  Past Medical History  Diagnosis Date  . Dyspnea   . Esophageal reflux   . CAD (coronary artery disease)   . Hypertension   . Crohn's disease   . Allergic rhinitis   . COPD (chronic obstructive pulmonary disease)   . History of kidney stones   . History of shingles   . Trigeminal neuralgia   . Family history of anesthesia complication     " SISTER HAD BAD FEELINGS "  . Anginal pain   . Anxiety     ' JUST FOR MY BREATHING "  . Arthritis     mild in hands  . Hyperlipidemia     Family History  Problem Relation Age of Onset  . Breast cancer Sister   . COPD Sister   . Stroke Mother   . Prostate cancer Father   . Prostate cancer Brother   . Heart attack Brother     History   Social History  . Marital Status: Widowed    Spouse Name: N/A    Number of Children: N/A  . Years of Education: N/A   Occupational History  . PT [employer]    Social History Main Topics  . Smoking status: Former Smoker -- 1.00 packs/day for 30 years    Types: Cigarettes    Quit date: 04/12/1990  . Smokeless tobacco: Never Used  . Alcohol Use: No  . Drug Use: No  . Sexual Activity: No   Other Topics Concern  . Not on file   Social History Narrative   Has a living will and HPOA- Charlett BlakeDaniel Villegas.   Would desire CPR.   Would not want prolonged life support, would not want feeding tubes.          Allergies  Allergen Reactions  . Avelox [Moxifloxacin Hcl In Nacl] Shortness Of Breath, Swelling and Rash  . Theophyllines     Slight increased heart rate  . Ciprofloxacin   . Codeine Other (See Comments)    Couldn't breath  . Milk-Related Compounds Other (See Comments)    Hurts stomach..   . Lisinopril Rash     Constitutional: Denies fever, malaise, fatigue, headache or abrupt weight changes.   GU: Pt reports urgency, frequency and pain with urination. Denies burning sensation, blood in urine, odor or discharge. Skin: Denies redness, rashes, lesions or ulcercations.   No other specific complaints in a complete review of systems (except as listed in HPI above).    Objective:   Physical Exam  BP 126/58  Pulse 87  Temp(Src) 97.9 F (36.6 C) (Oral)  Wt 146 lb (66.225 kg)  SpO2 97% Wt Readings from Last 3 Encounters:  09/18/13 146 lb (66.225 kg)  09/06/13 147 lb (66.679 kg)  07/03/13 146 lb 3.2 oz (66.316 kg)    General: Appears her stated age, well developed, well nourished in NAD. Cardiovascular: Normal rate and rhythm. S1,S2 noted.  No murmur, rubs or gallops noted. No JVD or BLE edema. No carotid bruits noted. Pulmonary/Chest: Normal effort and positive vesicular breath sounds. No respiratory distress. No wheezes, rales or ronchi noted.  Abdomen: Soft and nontender. Normal bowel sounds, no bruits noted. No distention or masses noted.  Liver, spleen and kidneys non palpable. Tender to palpation over the bladder area. No CVA tenderness.      Assessment & Plan:   Urgency, Frequency, Dysuria secondary to UTI  Urinalysis: trace blood, mod leuks Will send urine culture eRx sent if for Macrobid 100 mg BID x 5 days OK to take AZO OTC Drink plenty of fluids  RTC as needed or if symptoms persist.

## 2013-09-18 NOTE — Addendum Note (Signed)
Addended by: Roena Malady on: 09/18/2013 11:49 AM   Modules accepted: Orders

## 2013-09-18 NOTE — Progress Notes (Signed)
Subjective:    Patient ID: Brandi Cline, female    DOB: 10-09-1930, 80 y.o.   MRN: [id number] Acute visit- Dr Shelle Iron 09/28/11- The patient comes in today for an acute sick visit.  She has known significant COPD, and is usually followed by Dr. [name].  She gives a 2 to three-day history of increasing shortness of breath, and this culminated in severe shortness of breath this morning.  She is currently a little better after using her morning bronchodilators and her neb treatment.  She has no significant cough, mucus, or congestion.  She does feel a chest heaviness, especially on the left side.  She has a history of coronary disease with stents, but thinks that chest discomfort is different from her usual angina.  Surprisingly, her oxygen saturations today are excellent.  12/06/11 - 80 yoF followed for COPD, complicated by allergic rhinitis, GERD, Crohns Disease, CAD/ stents Patient states had pneumonia/ hosp/ heart cath( ok w/ no new stents) in June. States breathing is a little worse since last office visit. States wears oxygen on 2L as needed. c/o sob, wheezing, and chest tightness every now and then. Denies chest pain and cough. Occasional scant clear mucus. Denies chest pain. Notices dyspnea mainly with exertion, worse in humid weather. CXR 10/08/11-reviewed with her IMPRESSION:  COPD/chronic changes. No active disease.  Original Report Authenticated By: [name], M.D.   03/17/12- 80 yoF followed for COPD, complicated by allergic rhinitis, GERD, Crohns Disease, CAD/ stents FOLLOWS FOR: feels like symibcort and Spiriva not helping anymore-would like something different and cheaper Sister here 2 URI's since August treated by PCP. Needed prednisone.  Feels Symbicort wear off before 12 hour next dose. Wearing O2 more and using neb twice daily.  Little cough- scant clear mucus. Aware of angina despite isorbide. Feet swell- varies, not new or progressive. CXR 10/07/11- COPD  04/27/12-  81 yoF  followed for COPD, complicated by allergic rhinitis, GERD, Crohns Disease, CAD/ stents FOLLOWS FOR: Increased SOB today-unsure of cause(used nebulizer before OV-not much  difference), did not hear any wheezing. Denies any cough or chest congestion Often feels tight at night. Aware of increased fluid retention occasionally. Denies chest pain, infection, wheeze or cough. Last visit we gave sample Breo ellipta, which she blames for fluid retention. Continues oxygen 2 L/Advanced for sleep and as needed  12/19/12- 81 yoF former smoker followed for COPD, complicated by allergic rhinitis, GERD, Crohns Disease, CAD/ stents     Family here. ACUTE:  Increased SOB, tightness that travels around to back and some wheezing.  Sore throat in am and  scratchy all day Continues oxygen 3 L/Advanced for sleep and as needed. She has been more dyspneic over past year. We reviewed Chest CT from the spring, noting it was done because of dyspnea then. Hosp 3/26-07/12/12 for AECOPD with NAD on CXR.  Now 3-4 weeks variable hoarseness- on magic mouthwash and prednisone taper. Some difficulty swallowing.  No cough, scant deep yellow sputum, some tight. No pain, blood, fever, palpitation. Easy DOE needing 3LO2 rest, 4-5L exertion in home. CT chest 07/05/12 IMPRESSION:  . Stable hyperinflation and chronic interstitial prominence. Again  noted left basilar scarring. No acute infiltrate or pulmonary  edema.  Original Report Authenticated By: [name], M.D. CXR 07/10/12 IMPRESSION:  No acute cardiopulmonary abnormality seen. Chronic findings as  described above.  Original Report Authenticated By: [name]., M.D.  01/30/13- 28 yoF former smoker followed for COPD, complicated by allergic rhinitis, GERD, Crohns Disease, CAD/ stents  Family here. FOLLOWS FOR: "weak as water"; swelling in feet, staying tired. Good and bad days Weight gain 10 pounds in the past month. Ankles swell. Little cough, scant yellow. Pain mid  thoracic spine. Continues O2 4L/ Advanced. Theophylline was no help and caused palpitations, so quit. Last prednisone 2 weeks ago. ENT/Dr. [name] saw her in September and diagnosed esophageal reflux and thrush. He doubled her acid blocker.  03/20/13- 62 yoF former smoker followed for COPD, complicated by allergic rhinitis, GERD, Crohns Disease, CAD/ stents   Daughter here FOLLOWS FOR: Breathing is unchanged. Reports SOB and coughing. Denies chest tightness or wheezing.   Dr. Kevan Ny is treating with Lasix. She is off of prednisone. Morning cough with clear mucus. Using Flutter device for pulmonary toilet. Medications discussed. Hosp for AECOPD 05/13/12- 05/15/12 CXR 01/30/13 IMPRESSION:  Bibasilar linear opacities, consistent with atelectasis or scarring.  Emphysematous changes.  Electronically Signed  By: [name] M.D.  On: 01/30/2013 16:54  07/03/13- 89 yoF former smoker followed for COPD, complicated by allergic rhinitis, GERD, Crohns Disease, CAD/ stents   Daughter here FOLLOWS FOR: Symbicort works well for patient about 4-5 hours and then uses rescue inhaler; using neb treatments as needed; but unsure if Spiriva is actually helping. Wonders if she can try [name] again as she has had past 2 days without any side effects. Now finishing prednisone and Z-Pak from primary physician. Denies any benefit from [facility]. Using either nebulizer or Proair once or twice daily.  09/18/13- 89 yoF former smoker followed for COPD, complicated by allergic rhinitis, GERD, Crohns Disease, CAD/ stents   Daughter here FOLLOWS FOR: Pt states breathing has worsened since last OV. Pt states the theophyillne isnt helping. Pt states she has an increase in SOB. C/o mild productive cough with clear mucus and chest tightness with deep breaths.  Using her nebulizer or rescue inhaler at least once daily. Symbicort only lasts 8 hours. Theophylline was no help. Little cough denies chest pain or palpitation, except that  Breo trial caused pal[pitations. Sample Anoro inhaler did seem helpful but insurance coverage unclear. She asks another sample while insurance is checked.  ROS-see HPI Constitutional:   No-   weight loss, night sweats, fevers, chills, fatigue, lassitude. HEENT:   No-  headaches, +difficulty swallowing, tooth/dental problems, +sore throat,       No-  sneezing, itching, ear ache, nasal congestion, post nasal drip,  CV:  No-   chest pain, orthopnea, PND, +swelling in lower extremities, anasarca, dizziness, palpitations Resp: +  shortness of breath with exertion or at rest.             No-  productive cough, no- non-productive cough,  No- coughing up of blood.              No- change in color of mucus.  No- wheezing.   Skin: No-rash or lesions. GI:  + heartburn, indigestion, no-abdominal pain, nausea, vomiting,  GU:  MS:  No-   joint pain or swelling.  + back pain Neuro-     nothing unusual Psych:  No- change in mood or affect. No depression or anxiety.  No memory loss.  Objective:  OBJ- Physical Exam General- Alert, Oriented, Affect-appropriate, Distress- none acute, wheelchair, O2 3L Skin- rash-none, lesions- none, excoriation- none Lymphadenopathy- none Head- atraumatic            Eyes- Gross vision intact, PERRLA, conjunctivae and secretions clear            Ears- +Hearing aids  Nose- Clear, no-Septal dev, mucus, polyps, erosion, perforation             Throat- Mallampati II , mucosa clear , drainage- none, tonsils- atrophic. +Hoarse Neck- flexible , trachea midline, no stridor , thyroid nl, carotid no bruit Chest - symmetrical excursion , unlabored           Heart/CV- RRR , no murmur , no gallop  , no rub, nl s1 s2                           - JVD-+ 1, edema- 1+, stasis changes- none, varices- none           Lung- decreased breath sounds, clear, wheeze- none, cough- none ,                                             dullness-none, rub-none           Chest wall-  Abd-  Br/  Gen/ Rectal- Not done, not indicated Extrem- cyanosis- none, clubbing, none, atrophy- none, strength- nl.  Neuro- grossly intact to observation  Assessment & Plan:

## 2013-09-18 NOTE — Progress Notes (Signed)
Pre visit review using our clinic review tool, if applicable. No additional management support is needed unless otherwise documented below in the visit note. 

## 2013-09-19 ENCOUNTER — Encounter (HOSPITAL_COMMUNITY): Payer: Self-pay | Admitting: Emergency Medicine

## 2013-09-19 ENCOUNTER — Emergency Department (HOSPITAL_COMMUNITY): Payer: Medicare HMO

## 2013-09-19 ENCOUNTER — Inpatient Hospital Stay (HOSPITAL_COMMUNITY)
Admission: EM | Admit: 2013-09-19 | Discharge: 2013-09-23 | DRG: 871 | Disposition: A | Discharge status: Home / Self Care | Payer: Medicare HMO | Attending: Internal Medicine | Admitting: Internal Medicine

## 2013-09-19 DIAGNOSIS — J9600 Acute respiratory failure, unspecified whether with hypoxia or hypercapnia: Secondary | ICD-10-CM | POA: Diagnosis present

## 2013-09-19 DIAGNOSIS — I2510 Atherosclerotic heart disease of native coronary artery without angina pectoris: Secondary | ICD-10-CM | POA: Diagnosis present

## 2013-09-19 DIAGNOSIS — E860 Dehydration: Secondary | ICD-10-CM | POA: Diagnosis present

## 2013-09-19 DIAGNOSIS — E785 Hyperlipidemia, unspecified: Secondary | ICD-10-CM | POA: Diagnosis present

## 2013-09-19 DIAGNOSIS — Z87440 Personal history of urinary (tract) infections: Secondary | ICD-10-CM

## 2013-09-19 DIAGNOSIS — I509 Heart failure, unspecified: Secondary | ICD-10-CM | POA: Diagnosis present

## 2013-09-19 DIAGNOSIS — K7689 Other specified diseases of liver: Secondary | ICD-10-CM | POA: Diagnosis present

## 2013-09-19 DIAGNOSIS — J9611 Chronic respiratory failure with hypoxia: Secondary | ICD-10-CM | POA: Diagnosis present

## 2013-09-19 DIAGNOSIS — M129 Arthropathy, unspecified: Secondary | ICD-10-CM | POA: Diagnosis present

## 2013-09-19 DIAGNOSIS — I5032 Chronic diastolic (congestive) heart failure: Secondary | ICD-10-CM | POA: Diagnosis present

## 2013-09-19 DIAGNOSIS — Z79899 Other long term (current) drug therapy: Secondary | ICD-10-CM

## 2013-09-19 DIAGNOSIS — T380X5A Adverse effect of glucocorticoids and synthetic analogues, initial encounter: Secondary | ICD-10-CM | POA: Diagnosis not present

## 2013-09-19 DIAGNOSIS — Z7982 Long term (current) use of aspirin: Secondary | ICD-10-CM

## 2013-09-19 DIAGNOSIS — E872 Acidosis, unspecified: Secondary | ICD-10-CM | POA: Diagnosis present

## 2013-09-19 DIAGNOSIS — R197 Diarrhea, unspecified: Secondary | ICD-10-CM | POA: Diagnosis present

## 2013-09-19 DIAGNOSIS — I10 Essential (primary) hypertension: Secondary | ICD-10-CM | POA: Diagnosis present

## 2013-09-19 DIAGNOSIS — K5000 Crohn's disease of small intestine without complications: Secondary | ICD-10-CM | POA: Diagnosis present

## 2013-09-19 DIAGNOSIS — N390 Urinary tract infection, site not specified: Secondary | ICD-10-CM | POA: Diagnosis present

## 2013-09-19 DIAGNOSIS — J441 Chronic obstructive pulmonary disease with (acute) exacerbation: Secondary | ICD-10-CM

## 2013-09-19 DIAGNOSIS — J449 Chronic obstructive pulmonary disease, unspecified: Secondary | ICD-10-CM | POA: Diagnosis present

## 2013-09-19 DIAGNOSIS — R509 Fever, unspecified: Secondary | ICD-10-CM

## 2013-09-19 DIAGNOSIS — K7460 Unspecified cirrhosis of liver: Secondary | ICD-10-CM | POA: Diagnosis present

## 2013-09-19 DIAGNOSIS — R7309 Other abnormal glucose: Secondary | ICD-10-CM | POA: Diagnosis present

## 2013-09-19 DIAGNOSIS — N182 Chronic kidney disease, stage 2 (mild): Secondary | ICD-10-CM | POA: Diagnosis present

## 2013-09-19 DIAGNOSIS — A419 Sepsis, unspecified organism: Principal | ICD-10-CM | POA: Diagnosis present

## 2013-09-19 DIAGNOSIS — N12 Tubulo-interstitial nephritis, not specified as acute or chronic: Secondary | ICD-10-CM | POA: Diagnosis present

## 2013-09-19 DIAGNOSIS — J9620 Acute and chronic respiratory failure, unspecified whether with hypoxia or hypercapnia: Secondary | ICD-10-CM | POA: Diagnosis present

## 2013-09-19 DIAGNOSIS — Z9981 Dependence on supplemental oxygen: Secondary | ICD-10-CM

## 2013-09-19 DIAGNOSIS — Z87891 Personal history of nicotine dependence: Secondary | ICD-10-CM

## 2013-09-19 DIAGNOSIS — D696 Thrombocytopenia, unspecified: Secondary | ICD-10-CM | POA: Diagnosis present

## 2013-09-19 DIAGNOSIS — R1032 Left lower quadrant pain: Secondary | ICD-10-CM

## 2013-09-19 DIAGNOSIS — D649 Anemia, unspecified: Secondary | ICD-10-CM | POA: Diagnosis present

## 2013-09-19 DIAGNOSIS — A498 Other bacterial infections of unspecified site: Secondary | ICD-10-CM | POA: Diagnosis present

## 2013-09-19 DIAGNOSIS — Z90710 Acquired absence of both cervix and uterus: Secondary | ICD-10-CM

## 2013-09-19 DIAGNOSIS — I129 Hypertensive chronic kidney disease with stage 1 through stage 4 chronic kidney disease, or unspecified chronic kidney disease: Secondary | ICD-10-CM | POA: Diagnosis present

## 2013-09-19 DIAGNOSIS — R161 Splenomegaly, not elsewhere classified: Secondary | ICD-10-CM | POA: Diagnosis present

## 2013-09-19 DIAGNOSIS — G500 Trigeminal neuralgia: Secondary | ICD-10-CM | POA: Diagnosis present

## 2013-09-19 DIAGNOSIS — Z9861 Coronary angioplasty status: Secondary | ICD-10-CM

## 2013-09-19 DIAGNOSIS — K5090 Crohn's disease, unspecified, without complications: Secondary | ICD-10-CM | POA: Diagnosis present

## 2013-09-19 DIAGNOSIS — B9620 Unspecified Escherichia coli [E. coli] as the cause of diseases classified elsewhere: Secondary | ICD-10-CM | POA: Diagnosis present

## 2013-09-19 LAB — BASIC METABOLIC PANEL
BUN: 14 mg/dL (ref 6–23)
CO2: 18 meq/L — AB (ref 19–32)
CREATININE: 0.78 mg/dL (ref 0.50–1.10)
Calcium: 9.1 mg/dL (ref 8.4–10.5)
Chloride: 98 mEq/L (ref 96–112)
GFR calc Af Amer: 89 mL/min — ABNORMAL LOW (ref >90)
GFR calc non Af Amer: 77 mL/min — ABNORMAL LOW (ref >90)
Glucose, Bld: 130 mg/dL — ABNORMAL HIGH (ref 70–99)
POTASSIUM: 3.3 meq/L — AB (ref 3.7–5.3)
Sodium: 136 mEq/L — ABNORMAL LOW (ref 137–147)

## 2013-09-19 LAB — CBC
HCT: 35.2 % — ABNORMAL LOW (ref 36.0–46.0)
Hemoglobin: 11.8 g/dL — ABNORMAL LOW (ref 12.0–15.0)
MCH: 30 pg (ref 26.0–34.0)
MCHC: 33.5 g/dL (ref 30.0–36.0)
MCV: 89.6 fL (ref 78.0–100.0)
Platelets: 72 10*3/uL — ABNORMAL LOW (ref 150–400)
RBC: 3.93 MIL/uL (ref 3.87–5.11)
RDW: 14.4 % (ref 11.5–15.5)
WBC: 7.8 10*3/uL (ref 4.0–10.5)

## 2013-09-19 LAB — URINALYSIS, ROUTINE W REFLEX MICROSCOPIC
GLUCOSE, UA: NEGATIVE mg/dL
Hgb urine dipstick: NEGATIVE
KETONES UR: 15 mg/dL — AB
NITRITE: POSITIVE — AB
PROTEIN: NEGATIVE mg/dL
Specific Gravity, Urine: 1.019 (ref 1.005–1.030)
Urobilinogen, UA: 1 mg/dL (ref 0.0–1.0)
pH: 5 (ref 5.0–8.0)

## 2013-09-19 LAB — URINE MICROSCOPIC-ADD ON

## 2013-09-19 LAB — TROPONIN I

## 2013-09-19 LAB — I-STAT CG4 LACTIC ACID, ED: Lactic Acid, Venous: 1.78 mmol/L (ref 0.5–2.2)

## 2013-09-19 LAB — D-DIMER, QUANTITATIVE (NOT AT ARMC): D-Dimer, Quant: 0.65 ug/mL-FEU — ABNORMAL HIGH (ref 0.00–0.48)

## 2013-09-19 LAB — PRO B NATRIURETIC PEPTIDE: Pro B Natriuretic peptide (BNP): 163.3 pg/mL (ref 0–450)

## 2013-09-19 MED ORDER — IOHEXOL 300 MG/ML  SOLN
20.0000 mL | INTRAMUSCULAR | Status: AC
  Administered 2013-09-19 – 2013-09-20 (×2): 20 mL via ORAL

## 2013-09-19 MED ORDER — IPRATROPIUM-ALBUTEROL 0.5-2.5 (3) MG/3ML IN SOLN
3.0000 mL | RESPIRATORY_TRACT | Status: DC | PRN
  Administered 2013-09-19 – 2013-09-21 (×2): 3 mL via RESPIRATORY_TRACT
  Filled 2013-09-19 (×2): qty 3

## 2013-09-19 MED ORDER — IPRATROPIUM-ALBUTEROL 0.5-2.5 (3) MG/3ML IN SOLN
3.0000 mL | RESPIRATORY_TRACT | Status: DC
  Administered 2013-09-20 (×3): 3 mL via RESPIRATORY_TRACT
  Filled 2013-09-19 (×3): qty 3

## 2013-09-19 MED ORDER — VITAMIN D-3 125 MCG (5000 UT) PO TABS: 1.0000 | ORAL_TABLET | Freq: Every day | ORAL | Status: DC

## 2013-09-19 MED ORDER — SIMVASTATIN 20 MG PO TABS
20.0000 mg | ORAL_TABLET | ORAL | Status: DC
  Administered 2013-09-21: 20 mg via ORAL
  Filled 2013-09-19: qty 1

## 2013-09-19 MED ORDER — HEPARIN SODIUM (PORCINE) 5000 UNIT/ML IJ SOLN
5000.0000 [IU] | Freq: Three times a day (TID) | INTRAMUSCULAR | Status: DC
  Administered 2013-09-20 – 2013-09-23 (×12): 5000 [IU] via SUBCUTANEOUS
  Filled 2013-09-19 (×13): qty 1

## 2013-09-19 MED ORDER — IOHEXOL 350 MG/ML SOLN
100.0000 mL | Freq: Once | INTRAVENOUS | Status: AC | PRN
  Administered 2013-09-19: 70 mL via INTRAVENOUS

## 2013-09-19 MED ORDER — NITROGLYCERIN 0.4 MG SL SUBL: 0.4000 mg | SUBLINGUAL_TABLET | SUBLINGUAL | Status: DC | PRN

## 2013-09-19 MED ORDER — ONE-DAILY MULTI VITAMINS PO TABS: 1.0000 | ORAL_TABLET | Freq: Every day | ORAL | Status: DC

## 2013-09-19 MED ORDER — FUROSEMIDE 20 MG PO TABS
20.0000 mg | ORAL_TABLET | Freq: Every day | ORAL | Status: DC
  Filled 2013-09-19: qty 1

## 2013-09-19 MED ORDER — SODIUM CHLORIDE 0.9 % IV BOLUS (SEPSIS)
1000.0000 mL | Freq: Once | INTRAVENOUS | Status: AC
  Administered 2013-09-19: 1000 mL via INTRAVENOUS

## 2013-09-19 MED ORDER — METHYLPREDNISOLONE SODIUM SUCC 125 MG IJ SOLR
125.0000 mg | Freq: Two times a day (BID) | INTRAMUSCULAR | Status: DC
  Administered 2013-09-20: 125 mg via INTRAVENOUS
  Filled 2013-09-19 (×3): qty 2

## 2013-09-19 MED ORDER — ASPIRIN 81 MG PO TABS: 81.0000 mg | ORAL_TABLET | Freq: Every day | ORAL | Status: DC

## 2013-09-19 MED ORDER — ESTRADIOL 0.5 MG PO TABS
0.2500 mg | ORAL_TABLET | Freq: Every day | ORAL | Status: DC
  Administered 2013-09-20 – 2013-09-23 (×4): 0.25 mg via ORAL
  Filled 2013-09-19 (×4): qty 1

## 2013-09-19 MED ORDER — CYANOCOBALAMIN 1000 MCG/ML IJ SOLN: 1000.0000 ug | INTRAMUSCULAR | Status: DC

## 2013-09-19 MED ORDER — IPRATROPIUM BROMIDE 0.02 % IN SOLN
0.5000 mg | Freq: Once | RESPIRATORY_TRACT | Status: AC
  Administered 2013-09-19: 0.5 mg via RESPIRATORY_TRACT
  Filled 2013-09-19: qty 2.5

## 2013-09-19 MED ORDER — POTASSIUM CHLORIDE CRYS ER 20 MEQ PO TBCR
20.0000 meq | EXTENDED_RELEASE_TABLET | Freq: Every day | ORAL | Status: DC
  Administered 2013-09-20 – 2013-09-23 (×4): 20 meq via ORAL
  Filled 2013-09-19 (×5): qty 1

## 2013-09-19 MED ORDER — ISOSORBIDE MONONITRATE ER 60 MG PO TB24
60.0000 mg | ORAL_TABLET | Freq: Every day | ORAL | Status: DC
  Administered 2013-09-20 – 2013-09-23 (×4): 60 mg via ORAL
  Filled 2013-09-19 (×4): qty 1

## 2013-09-19 MED ORDER — DEXTROSE 5 % IV SOLN
1.0000 g | Freq: Once | INTRAVENOUS | Status: AC
  Administered 2013-09-19: 1 g via INTRAVENOUS
  Filled 2013-09-19: qty 10

## 2013-09-19 MED ORDER — ALPRAZOLAM 0.5 MG PO TABS
0.5000 mg | ORAL_TABLET | Freq: Two times a day (BID) | ORAL | Status: DC | PRN
  Administered 2013-09-20 – 2013-09-23 (×7): 0.5 mg via ORAL
  Filled 2013-09-19 (×7): qty 1

## 2013-09-19 MED ORDER — LOSARTAN POTASSIUM 50 MG PO TABS
50.0000 mg | ORAL_TABLET | Freq: Every day | ORAL | Status: DC
  Filled 2013-09-19: qty 1

## 2013-09-19 MED ORDER — SODIUM CHLORIDE 0.9 % IV SOLN
INTRAVENOUS | Status: DC
  Administered 2013-09-20: 01:00:00 via INTRAVENOUS
  Administered 2013-09-21: 1000 mL via INTRAVENOUS
  Administered 2013-09-22: 05:00:00 via INTRAVENOUS

## 2013-09-19 MED ORDER — PANTOPRAZOLE SODIUM 40 MG PO TBEC
40.0000 mg | DELAYED_RELEASE_TABLET | Freq: Every day | ORAL | Status: DC
  Administered 2013-09-20 – 2013-09-23 (×4): 40 mg via ORAL
  Filled 2013-09-19 (×4): qty 1

## 2013-09-19 MED ORDER — ACETAMINOPHEN 325 MG PO TABS
650.0000 mg | ORAL_TABLET | Freq: Once | ORAL | Status: AC
  Administered 2013-09-19: 650 mg via ORAL
  Filled 2013-09-19: qty 2

## 2013-09-19 MED ORDER — MORPHINE SULFATE 4 MG/ML IJ SOLN
4.0000 mg | Freq: Once | INTRAMUSCULAR | Status: DC
  Filled 2013-09-19: qty 1

## 2013-09-19 MED ORDER — ALBUTEROL SULFATE (2.5 MG/3ML) 0.083% IN NEBU
5.0000 mg | INHALATION_SOLUTION | Freq: Once | RESPIRATORY_TRACT | Status: AC
  Administered 2013-09-19: 5 mg via RESPIRATORY_TRACT
  Filled 2013-09-19: qty 6

## 2013-09-19 MED ORDER — LORATADINE 10 MG PO TABS
10.0000 mg | ORAL_TABLET | Freq: Every day | ORAL | Status: DC
  Administered 2013-09-20 – 2013-09-23 (×4): 10 mg via ORAL
  Filled 2013-09-19 (×4): qty 1

## 2013-09-19 MED ORDER — MAGNESIUM OXIDE 400 MG PO TABS
800.0000 mg | ORAL_TABLET | Freq: Every day | ORAL | Status: DC
  Administered 2013-09-20 – 2013-09-23 (×4): 800 mg via ORAL
  Filled 2013-09-19 (×4): qty 2

## 2013-09-19 MED ORDER — ONDANSETRON HCL 4 MG/2ML IJ SOLN
4.0000 mg | Freq: Once | INTRAMUSCULAR | Status: DC
  Filled 2013-09-19: qty 2

## 2013-09-19 MED ORDER — DEXTROSE 5 % IV SOLN
1.0000 g | Freq: Two times a day (BID) | INTRAVENOUS | Status: DC
  Administered 2013-09-20 – 2013-09-21 (×4): 1 g via INTRAVENOUS
  Filled 2013-09-19 (×5): qty 1

## 2013-09-19 NOTE — ED Provider Notes (Signed)
CSN: [id number]     Arrival date & time 09/19/13  1935 History   First MD Initiated Contact with Patient 09/19/13 1946     Chief Complaint  Patient presents with  . Shortness of Breath     (Consider location/radiation/quality/duration/timing/severity/associated sxs/prior Treatment) HPI Pt presenting with c/o shortness of breath.  Pt states she had been feeling poorly for several days, fatigued and weak- saw her doctor yesterday and was started on macrobid for UTI.  She states that today she began to have increased shortness of breath.  Worse with exertion.  She tried taking her inhalers, but this did not provide any relief.  No fever/chills.  No abdominal pain.  Denies chest pain.  There are no other associated systemic symptoms, there are no other alleviating or modifying factors.   Past Medical History  Diagnosis Date  . Dyspnea   . Esophageal reflux   . CAD (coronary artery disease)   . Hypertension   . Crohn's disease   . Allergic rhinitis   . COPD (chronic obstructive pulmonary disease)   . History of kidney stones   . History of shingles   . Trigeminal neuralgia   . Family history of anesthesia complication     " SISTER HAD BAD FEELINGS "  . Anginal pain   . Anxiety     ' JUST FOR MY BREATHING "  . Arthritis     mild in hands  . Hyperlipidemia    Past Surgical History  Procedure Laterality Date  . Coronary stents    . Lithotripsy    . Ureteral stent placement    . Cholecystectomy    . Total abdominal hysterectomy    . Partial resections large and small bowel for crohn's    . Breast lumps benign     Family History  Problem Relation Age of Onset  . Breast cancer Sister   . COPD Sister   . Stroke Mother   . Prostate cancer Father   . Prostate cancer Brother   . Heart attack Brother    History  Substance Use Topics  . Smoking status: Former Smoker -- 1.00 packs/day for 30 years    Types: Cigarettes    Quit date: 04/12/1990  . Smokeless tobacco: Never Used   . Alcohol Use: No   OB History   Grav Para Term Preterm Abortions TAB SAB Ect Mult Living                 Review of Systems ROS reviewed and all otherwise negative except for mentioned in HPI    Allergies  Avelox; Theophyllines; Ciprofloxacin; Codeine; Milk-related compounds; and Lisinopril  Home Medications   Prior to Admission medications   Medication Sig Start Date End Date Taking? Authorizing Provider  albuterol (PROVENTIL HFA;VENTOLIN HFA) 108 (90 BASE) MCG/ACT inhaler Inhale 2 puffs into the lungs every 4 (four) hours as needed for wheezing or shortness of breath. 03/20/13   Waymon Budgelinton D Young, MD  albuterol (PROVENTIL) (2.5 MG/3ML) 0.083% nebulizer solution Take 2.5 mg by nebulization every 4 (four) hours as needed. Shortness of breath 07/16/11   Tammy S Parrett, NP  ALPRAZolam (XANAX) 0.5 MG tablet Take 1 tablet (0.5 mg total) by mouth 2 (two) times daily as needed for anxiety. For anxiety 09/11/13   Dianne Dunalia M Aron, MD  aspirin 81 MG tablet Take 81 mg by mouth daily.    Historical Provider, MD  budesonide-formoterol (SYMBICORT) 160-4.5 MCG/ACT inhaler Inhale 2 puffs into the lungs 2 (two) times  daily. 07/03/13   Waymon Budge, MD  Cholecalciferol (VITAMIN D-3) 5000 UNITS TABS Take 1 tablet by mouth daily.    Historical Provider, MD  cyanocobalamin (,VITAMIN B-12,) 1000 MCG/ML injection Inject 1,000 mcg into the muscle every 30 (thirty) days. 06/13/13   Dianne Dun, MD  estradiol (ESTRACE) 0.5 MG tablet Take 0.5 tablets (0.25 mg total) by mouth daily. 06/13/13   Dianne Dun, MD  fish oil-omega-3 fatty acids 1000 MG capsule Take 2 g by mouth daily.     Historical Provider, MD  furosemide (LASIX) 20 MG tablet Take 1 tablet (20 mg total) by mouth daily. 06/13/13   Dianne Dun, MD  isosorbide mononitrate (IMDUR) 30 MG 24 hr tablet Take 2 tablets (60 mg total) by mouth daily. 04/27/13   Lyn Records III, MD  loratadine (CLARITIN) 10 MG tablet Take 1 tablet (10 mg total) by mouth daily. 07/11/12    Marden Noble, MD  losartan (COZAAR) 50 MG tablet Take 1 tablet (50 mg total) by mouth daily. 04/27/13   Lyn Records III, MD  magnesium oxide (MAG-OX) 400 MG tablet Take 800 mg by mouth daily.    Historical Provider, MD  Multiple Vitamin (MULTIVITAMIN) tablet Take 1 tablet by mouth daily.     Historical Provider, MD  nitrofurantoin, macrocrystal-monohydrate, (MACROBID) 100 MG capsule Take 1 capsule (100 mg total) by mouth 2 (two) times daily. 09/18/13   Nicki Reaper, NP  nitroGLYCERIN (NITROSTAT) 0.4 MG SL tablet Place 1 tablet (0.4 mg total) under the tongue every 5 (five) minutes as needed. Chest pain 04/27/13   Lyn Records III, MD  omeprazole (PRILOSEC) 20 MG capsule Take 1 capsule (20 mg total) by mouth daily. 06/13/13   Dianne Dun, MD  potassium chloride SA (K-DUR,KLOR-CON) 20 MEQ tablet Alternate taking 2 one day then 1 the next 07/18/13   Lyn Records III, MD  simvastatin (ZOCOR) 20 MG tablet Take 20 mg by mouth. Monday, Wednesday, and Friday 04/27/13   Lyn Records III, MD   BP 136/46  Pulse 94  Temp(Src) 99.9 F (37.7 C) (Rectal)  Resp 23  Ht 5\' 4"  (1.626 m)  Wt 146 lb (66.225 kg)  BMI 25.05 kg/m2  SpO2 96% Vitals reviewed Physical Exam Physical Examination: General appearance - alert, ill appearing, and in no distress Mental status - alert, oriented to person, place, and time Eyes - no conjunctival injection, no scleral icterus Mouth - mucous membranes moist, pharynx normal without lesions Chest - diffuse bilateral wheezes, rales or rhonchi, symmetric air entry Heart - tachycardic, regular rhythm, normal S1, S2, no murmurs, rubs, clicks or gallops Abdomen - soft, nontender, nondistended, no masses or organomegaly Extremities - peripheral pulses normal, no pedal edema, no clubbing or cyanosis Skin - normal coloration and turgor, no rashes  ED Course  Procedures (including critical care time)  10:28 PM d/w triad for admission, Dr. Alvester Morin will see patient in the ED.  Pt started  on rocephin due to cipro and avelox allergies.    CRITICAL CARE Performed by: Ethelda Chick Total critical care time: 45 Critical care time was exclusive of separately billable procedures and treating other patients. Critical care was necessary to treat or prevent imminent or life-threatening deterioration. Critical care was time spent personally by me on the following activities: development of treatment plan with patient and/or surrogate as well as nursing, discussions with consultants, evaluation of patient's response to treatment, examination of patient, obtaining history from patient or  surrogate, ordering and performing treatments and interventions, ordering and review of laboratory studies, ordering and review of radiographic studies, pulse oximetry and re-evaluation of patient's condition. Labs Review Labs Reviewed  CBC - Abnormal; Notable for the following:    Hemoglobin 11.8 (*)    HCT 35.2 (*)    Platelets 72 (*)    All other components within normal limits  BASIC METABOLIC PANEL - Abnormal; Notable for the following:    Sodium 136 (*)    Potassium 3.3 (*)    CO2 18 (*)    Glucose, Bld 130 (*)    GFR calc non Af Amer 77 (*)    GFR calc Af Amer 89 (*)    All other components within normal limits  D-DIMER, QUANTITATIVE - Abnormal; Notable for the following:    D-Dimer, Quant 0.65 (*)    All other components within normal limits  URINALYSIS, ROUTINE W REFLEX MICROSCOPIC - Abnormal; Notable for the following:    Color, Urine ORANGE (*)    APPearance CLOUDY (*)    Bilirubin Urine SMALL (*)    Ketones, ur 15 (*)    Nitrite POSITIVE (*)    Leukocytes, UA LARGE (*)    All other components within normal limits  URINE MICROSCOPIC-ADD ON - Abnormal; Notable for the following:    Squamous Epithelial / LPF MANY (*)    Bacteria, UA MANY (*)    Casts HYALINE CASTS (*)    All other components within normal limits  CULTURE, BLOOD (ROUTINE X 2)  CULTURE, BLOOD (ROUTINE X 2)  URINE  CULTURE  CULTURE, EXPECTORATED SPUTUM-ASSESSMENT  TROPONIN I  PRO B NATRIURETIC PEPTIDE  COMPREHENSIVE METABOLIC PANEL  CBC WITH DIFFERENTIAL  CLOSTRIDIUM DIFFICILE CULTURE-FECAL  TROPONIN I  TROPONIN I  TROPONIN I  OCCULT BLOOD X 1 CARD TO LAB, STOOL  MAGNESIUM  SEDIMENTATION RATE  I-STAT CG4 LACTIC ACID, ED    Imaging Review Dg Chest 2 View  09/19/2013   CLINICAL DATA:  Shortness of Breath and productive cough  EXAM: CHEST  2 VIEW  COMPARISON:  January 30, 2013  FINDINGS: There is underlying emphysematous change. There is scarring in the lung bases, left than on the right, stable. There is no frank edema or consolidation. The heart size is within normal limits. The pulmonary vascularity raises question of pulmonary venous hypertension. No adenopathy. There is stable anterior wedging of a lower thoracic vertebral body.  IMPRESSION: There is pulmonary venous hypertension suggesting a degree of volume overload. There is no frank edema, however. There is underlying emphysema with bibasilar scarring. No airspace consolidation appreciable.   Electronically Signed   By: Bretta Bang M.D.   On: 09/19/2013 21:19   Ct Angio Chest Pe W/cm &/or Wo Cm  09/19/2013   CLINICAL DATA:  Shortness of breath.  Hypoxia.  Wheezing.  EXAM: CT ANGIOGRAPHY CHEST WITH CONTRAST  TECHNIQUE: Multidetector CT imaging of the chest was performed using the standard protocol during bolus administration of intravenous contrast. Multiplanar CT image reconstructions and MIPs were obtained to evaluate the vascular anatomy.  CONTRAST:  70mL OMNIPAQUE IOHEXOL 350 MG/ML SOLN  COMPARISON:  05/13/2012  FINDINGS: Technically adequate study with good opacification of the central and segmental pulmonary arteries. No focal filling defects are demonstrated. No evidence of significant pulmonary embolus.  Mild cardiac enlargement. Normal caliber thoracic aorta with calcification. No dissection. Great vessel origins are patent. Esophagus  is mostly decompressed. No significant lymphadenopathy in the chest. No pleural effusions. Visualized portions of the  upper abdominal organs demonstrate suggestion of hepatic cirrhosis and splenomegaly. This is incompletely evaluated.  Visualization of the lungs is limited due to respiratory motion artifact but there is extensive emphysema throughout the lungs with scattered peripheral fibrosis. Atelectasis in the lung bases. No focal consolidation or volume loss is suggested. No pneumothorax. Calcification in the right thyroid gland is stable.  Review of the MIP images confirms the above findings.  IMPRESSION: No evidence of significant pulmonary embolus. Extensive emphysematous changes in the lungs.   Electronically Signed   By: Burman Nieves M.D.   On: 09/19/2013 22:10     EKG Interpretation   Date/Time:  Wednesday September 19 2013 19:42:20 EDT Ventricular Rate:  113 PR Interval:  124 QRS Duration: 87 QT Interval:  344 QTC Calculation: 472 R Axis:   86 Text Interpretation:  Sinus tachycardia Borderline right axis deviation  Minimal ST depression, diffuse leads Since previous tracing rate faster  Confirmed by [facility]  MD, MARTHA [phone]) on 09/19/2013 9:22:22 PM      MDM   Final diagnoses:  UTI (lower urinary tract infection)  Febrile illness  COPD exacerbation    Pt presentign with c/o shortness of breath, was started on abx yesterday for UTI- in the ED she has rectal temp of 101.6, is tachycardic with increased O2 requirement from her baseline- blood and urine cultures obtained.  Pt started on rocephin for UTI- not flouroquinolone due to allergies.  CXR does not show pneumonia.  Pt had been started on solumedrol via EMS.  Pt given neb treatments in the ED.  D-dimer elevated, CT angio did not show PE.  Pt admitted to triad for further evaluation and management.      Ethelda Chick, MD 09/19/13 [phone]

## 2013-09-19 NOTE — ED Notes (Signed)
Pt requesting pain medication.  

## 2013-09-19 NOTE — Progress Notes (Signed)
ANTIBIOTIC CONSULT NOTE - INITIAL  Pharmacy Consult for Cefepime  Indication: r/o sepsis, COPD exacerbation, UTI  Allergies  Allergen Reactions  . Avelox [Moxifloxacin Hcl In Nacl] Shortness Of Breath, Swelling and Rash  . Theophyllines     Slight increased heart rate  . Ciprofloxacin   . Codeine Other (See Comments)    Couldn't breath  . Milk-Related Compounds Other (See Comments)    Hurts stomach..   . Lisinopril Rash    Patient Measurements: Height: 5\' 4"  (162.6 cm) Weight: 146 lb (66.225 kg) IBW/kg (Calculated) : 54.7  Vital Signs: Temp: 99.9 F (37.7 C) (06/10 2135) Temp src: Rectal (06/10 2135) BP: 136/46 mmHg (06/10 2315) Pulse Rate: 94 (06/10 2209)  Labs:  Recent Labs  09/19/13 2005  WBC 7.8  HGB 11.8*  PLT 72*  CREATININE 0.78    Microbiology: Recent Results (from the past 720 hour(s))  URINE CULTURE     Status: None   Collection Time    09/18/13 12:42 PM      Result Value Ref Range Status   Colony Count >=100,000 COLONIES/ML   Preliminary   Preliminary Report ESCHERICHIA COLI   Preliminary    Medical History: Past Medical History  Diagnosis Date  . Dyspnea   . Esophageal reflux   . CAD (coronary artery disease)   . Hypertension   . Crohn's disease   . Allergic rhinitis   . COPD (chronic obstructive pulmonary disease)   . History of kidney stones   . History of shingles   . Trigeminal neuralgia   . Family history of anesthesia complication     " SISTER HAD BAD FEELINGS "  . Anginal pain   . Anxiety     ' JUST FOR MY BREATHING "  . Arthritis     mild in hands  . Hyperlipidemia     Assessment: 80 y/o F to start cefepime for COPD exacerbation/UTI, r/o sepsis, also noted to have E. Coli UTI as outpatient from yesterday. WBC wnl, renal function ok, other labs as above.   Plan:  -Cefepime 1g IV q12h -Trend WBC, temp, renal function  -PO as able  Abran Duke 09/19/2013,11:21 PM

## 2013-09-19 NOTE — ED Notes (Signed)
Attempted to call report to Henriette CombsAmanda, RN on [id number]

## 2013-09-19 NOTE — H&P (Addendum)
Hospitalist Admission History and Physical  Patient name: Brandi Cline [facility] Medical record number: [id number] Date of birth: 12/22/1930 Age: 80 y.o. Gender: female  Primary Care Provider: [name], MD  Chief Complaint: sepsis, acute resp failure, COPD exacerbation, UTI, LLQ abd pain  History of Present Illness:This is a 80 y.o. year old female with noted by a history of COPD on 2 L oxygen at home, coronary artery disease, hypertension, Crohn's disease presenting with sepsis, acute respiratory failure with hypoxia, COPD exacerbation, UTI and LLQ abd pain.  patient seen by her PCP yesterday for a urinary tract infection. Was started on Macrobid. Patient states she woke up this morning with subjective fevers and chills. Symptoms seemed to progressively worsening over the course of the day. Also develop worsening cough and wheezing. Cough and wheezing persisted despite inhaler use. Patient developed progressive fatigue as well as decreased appetite. Patient states that she has diarrhea on a chronic basis in setting of Crohn's disease. Denies any worsening symptoms of bloody diarrhea. Has had some abdominal distention today. Presented to the ER because of worsening symptoms. Max temp was 101.6. Heart rate in the 90s to 110s. Blood pressure in the 130s to 160s. He is satting in the upper 80s on 2 L. Started on 4 L with O2 sats improving to the mid 90s. Her blood cell count of 7.8. Hemoglobin 11.8 potassium 3.3. Urinalysis indicative of infection. D-dimer mildly elevated at 0.65. Pro BNP of 163. Troponin negative x1. Lactate within normal limits. CT angiogram negative for PE though with extensive emphysematous changes. Patient started on Rocephin for UTI coverage in the ER. Noted flora quinolone allergy.  Assessment and Plan: Brandi Cline is a 80 y.o. year old female presenting with  sepsis, acute respiratory failure with hypoxia, COPD exacerbation, UTI, left lower quadrant abdominal pain   Active  Problems:   Acute respiratory failure   Sepsis   UTI (lower urinary tract infection)   LLQ abdominal pain   Sepsis: Multifactorial with contributions of acute respiratory failure with hypoxia/COPD and urinary tract infection. Panculture. We'll place on cefepime for respiratory and urinary coverage. May broaden with the addition of vancomycin this fever fails to improve. IV Solu-Medrol. stepdown bed. Continue to follow closely.   Left lower quadrant abdominal pain: Palpable left lower quadrant abdominal pain on exam today. Differential diagnosis includes pyelonephritis, diverticulitis, inflammatory bowel disease flare. CT of the abdomen and pelvis with IV and oral contrast. Sedimentation rate. Hemoccult. Solu-Medrol should give adequate coverage in the interim.   HTN/CAD: No active CP, though pt does report some subjective orthopnea. ProBNP/trop x1 WNL. Cycle CEs. Continue baby ASA, antihypertensives and lasix. Euvolemic on exam. Check 2D ECHO.   FEN/GI: NPO for now. Noted mild metabolic acidosis (bicarb 18, AG 20). Lactate WNL. Likely secondary to current illness with decreased po intake. Gently hydrate and reassess. PPI   Prophylaxis: sub q heparin Disposition: pending further evaluation  Code Status:Full Code    Patient Active Problem List   Diagnosis Date Noted  . Acute respiratory failure 09/19/2013  . Sepsis 09/19/2013  . UTI (lower urinary tract infection) 09/19/2013  . LLQ abdominal pain 09/19/2013  . Chronic respiratory failure, O2 dependent 09/07/2013  . Medicare annual wellness visit, subsequent 06/13/2013  . HLD (hyperlipidemia) 06/13/2013  . Post herpetic neuralgia 06/13/2013  . Recurrent UTI 06/13/2013  . Lower extremity edema 04/25/2013  . Diastolic congestive heart failure 04/25/2013  . Hoarseness 12/19/2012  . Hypokalemia 07/08/2012  . Acute respiratory failure with hypoxia  05/13/2012  . HYPERTENSION 03/21/2007  . CAD 03/21/2007  . ALLERGIC RHINITIS 03/21/2007   . COPD 03/21/2007  . Esophageal reflux 03/21/2007  . CROHN'S DISEASE 03/21/2007  . DYSPNEA 03/21/2007   Past Medical History: Past Medical History  Diagnosis Date  . Dyspnea   . Esophageal reflux   . CAD (coronary artery disease)   . Hypertension   . Crohn's disease   . Allergic rhinitis   . COPD (chronic obstructive pulmonary disease)   . History of kidney stones   . History of shingles   . Trigeminal neuralgia   . Family history of anesthesia complication     " SISTER HAD BAD FEELINGS "  . Anginal pain   . Anxiety     ' JUST FOR MY BREATHING "  . Arthritis     mild in hands  . Hyperlipidemia     Past Surgical History: Past Surgical History  Procedure Laterality Date  . Coronary stents    . Lithotripsy    . Ureteral stent placement    . Cholecystectomy    . Total abdominal hysterectomy    . Partial resections large and small bowel for crohn's    . Breast lumps benign      Social History: History   Social History  . Marital Status: Widowed    Spouse Name: N/A    Number of Children: N/A  . Years of Education: N/A   Occupational History  . PT [employer]    Social History Main Topics  . Smoking status: Former Smoker -- 1.00 packs/day for 30 years    Types: Cigarettes    Quit date: 04/12/1990  . Smokeless tobacco: Never Used  . Alcohol Use: No  . Drug Use: No  . Sexual Activity: No   Other Topics Concern  . None   Social History Narrative   Has a living will and HPOA- Tyrone Balash.   Would desire CPR.   Would not want prolonged life support, would not want feeding tubes.          Family History: Family History  Problem Relation Age of Onset  . Breast cancer Sister   . COPD Sister   . Stroke Mother   . Prostate cancer Father   . Prostate cancer Brother   . Heart attack Brother     Allergies: Allergies  Allergen Reactions  . Avelox [Moxifloxacin Hcl In Nacl] Shortness Of Breath, Swelling and Rash  . Theophyllines     Slight  increased heart rate  . Ciprofloxacin   . Codeine Other (See Comments)    Couldn't breath  . Milk-Related Compounds Other (See Comments)    Hurts stomach..   . Lisinopril Rash    Current Facility-Administered Medications  Medication Dose Route Frequency Provider Last Rate Last Dose  . 0.9 %  sodium chloride infusion   Intravenous Continuous Doree Albee, MD      . heparin injection 5,000 Units  5,000 Units Subcutaneous 3 times per day Doree Albee, MD      . ipratropium-albuterol (DUONEB) 0.5-2.5 (3) MG/3ML nebulizer solution 3 mL  3 mL Nebulization Q2H PRN Doree Albee, MD      . Melene Muller ON 09/20/2013] ipratropium-albuterol (DUONEB) 0.5-2.5 (3) MG/3ML nebulizer solution 3 mL  3 mL Nebulization Q4H Doree Albee, MD      . methylPREDNISolone sodium succinate (SOLU-MEDROL) 125 mg/2 mL injection 125 mg  125 mg Intravenous Q12H Doree Albee, MD      . morphine 4  MG/ML injection 4 mg  4 mg Intravenous Once Ethelda ChickMartha K Linker, MD      . ondansetron [facility](ZOFRAN) injection 4 mg  4 mg Intravenous Once Ethelda ChickMartha K Linker, MD       Current Outpatient Prescriptions  Medication Sig Dispense Refill  . albuterol (PROVENTIL HFA;VENTOLIN HFA) 108 (90 BASE) MCG/ACT inhaler Inhale 2 puffs into the lungs every 4 (four) hours as needed for wheezing or shortness of breath.  3 Inhaler  3  . albuterol (PROVENTIL) (2.5 MG/3ML) 0.083% nebulizer solution Take 2.5 mg by nebulization every 4 (four) hours as needed. Shortness of breath      . ALPRAZolam (XANAX) 0.5 MG tablet Take 1 tablet (0.5 mg total) by mouth 2 (two) times daily as needed for anxiety. For anxiety  180 tablet  0  . aspirin 81 MG tablet Take 81 mg by mouth daily.      . budesonide-formoterol (SYMBICORT) 160-4.5 MCG/ACT inhaler Inhale 2 puffs into the lungs 2 (two) times daily.  3 Inhaler  3  . Cholecalciferol (VITAMIN D-3) 5000 UNITS TABS Take 1 tablet by mouth daily.      . cyanocobalamin (,VITAMIN B-12,) 1000 MCG/ML injection Inject 1,000 mcg into the  muscle every 30 (thirty) days.      [name]. estradiol (ESTRACE) 0.5 MG tablet Take 0.5 tablets (0.25 mg total) by mouth daily.  45 tablet  0  . fish oil-omega-3 fatty acids 1000 MG capsule Take 2 g by mouth daily.       . furosemide (LASIX) 20 MG tablet Take 1 tablet (20 mg total) by mouth daily.  60 tablet  2  . isosorbide mononitrate (IMDUR) 30 MG 24 hr tablet Take 2 tablets (60 mg total) by mouth daily.  60 tablet  11  . loratadine (CLARITIN) 10 MG tablet Take 1 tablet (10 mg total) by mouth daily.  30 tablet  5  . losartan (COZAAR) 50 MG tablet Take 1 tablet (50 mg total) by mouth daily.  30 tablet  11  . magnesium oxide (MAG-OX) 400 MG tablet Take 800 mg by mouth daily.      . Multiple Vitamin (MULTIVITAMIN) tablet Take 1 tablet by mouth daily.       . nitrofurantoin, macrocrystal-monohydrate, (MACROBID) 100 MG capsule Take 1 capsule (100 mg total) by mouth 2 (two) times daily.  10 capsule  0  . nitroGLYCERIN (NITROSTAT) 0.4 MG SL tablet Place 1 tablet (0.4 mg total) under the tongue every 5 (five) minutes as needed. Chest pain  25 tablet  3  . omeprazole (PRILOSEC) 20 MG capsule Take 1 capsule (20 mg total) by mouth daily.  30 capsule  2  . potassium chloride SA (K-DUR,KLOR-CON) 20 MEQ tablet Alternate taking 2 one day then 1 the next  270 tablet  1  . simvastatin (ZOCOR) 20 MG tablet Take 20 mg by mouth. Monday, Wednesday, and Friday       Review Of Systems: 12 point ROS negative except as noted above in HPI.  Physical Exam: Filed Vitals:   09/19/13 2209  BP:   Pulse: 94  Temp:   Resp: 23    General: cooperative and moderate distress HEENT: PERRLA and extra ocular movement intact Heart: S1, S2 normal, no murmur, rub or gallop, regular rate and rhythm Lungs: pursed lip breathing, faint wheezes in bases  Abdomen: + abd distension, + LLQ TTP  Extremities: extremities normal, atraumatic, no cyanosis or edema Skin:no rashes, no ecchymoses Neurology: normal without focal findings  Labs  and Imaging: Lab Results  Component Value Date/Time   NA 136* 09/19/2013  8:05 PM   K 3.3* 09/19/2013  8:05 PM   CL 98 09/19/2013  8:05 PM   CO2 18* 09/19/2013  8:05 PM   BUN 14 09/19/2013  8:05 PM   CREATININE 0.78 09/19/2013  8:05 PM   GLUCOSE 130* 09/19/2013  8:05 PM   Lab Results  Component Value Date   WBC 7.8 09/19/2013   HGB 11.8* 09/19/2013   HCT 35.2* 09/19/2013   MCV 89.6 09/19/2013   PLT 72* 09/19/2013   Urinalysis    Component Value Date/Time   COLORURINE ORANGE* 09/19/2013 2000   APPEARANCEUR CLOUDY* 09/19/2013 2000   LABSPEC 1.019 09/19/2013 2000   PHURINE 5.0 09/19/2013 2000   GLUCOSEU NEGATIVE 09/19/2013 2000   HGBUR NEGATIVE 09/19/2013 2000   BILIRUBINUR SMALL* 09/19/2013 2000   BILIRUBINUR neg 09/18/2013 1148   KETONESUR 15* 09/19/2013 2000   PROTEINUR NEGATIVE 09/19/2013 2000   PROTEINUR neg 09/18/2013 1148   UROBILINOGEN 1.0 09/19/2013 2000   UROBILINOGEN 0.2 09/18/2013 1148   NITRITE POSITIVE* 09/19/2013 2000   NITRITE neg 09/18/2013 1148   LEUKOCYTESUR LARGE* 09/19/2013 2000       Dg Chest 2 View  09/19/2013   CLINICAL DATA:  Shortness of Breath and productive cough  EXAM: CHEST  2 VIEW  COMPARISON:  January 30, 2013  FINDINGS: There is underlying emphysematous change. There is scarring in the lung bases, left than on the right, stable. There is no frank edema or consolidation. The heart size is within normal limits. The pulmonary vascularity raises question of pulmonary venous hypertension. No adenopathy. There is stable anterior wedging of a lower thoracic vertebral body.  IMPRESSION: There is pulmonary venous hypertension suggesting a degree of volume overload. There is no frank edema, however. There is underlying emphysema with bibasilar scarring. No airspace consolidation appreciable.   Electronically Signed   By: Bretta Bang M.D.   On: 09/19/2013 21:19   Ct Angio Chest Pe W/cm &/or Wo Cm  09/19/2013   CLINICAL DATA:  Shortness of breath.  Hypoxia.  Wheezing.  EXAM: CT  ANGIOGRAPHY CHEST WITH CONTRAST  TECHNIQUE: Multidetector CT imaging of the chest was performed using the standard protocol during bolus administration of intravenous contrast. Multiplanar CT image reconstructions and MIPs were obtained to evaluate the vascular anatomy.  CONTRAST:  23mL OMNIPAQUE IOHEXOL 350 MG/ML SOLN  COMPARISON:  05/13/2012  FINDINGS: Technically adequate study with good opacification of the central and segmental pulmonary arteries. No focal filling defects are demonstrated. No evidence of significant pulmonary embolus.  Mild cardiac enlargement. Normal caliber thoracic aorta with calcification. No dissection. Great vessel origins are patent. Esophagus is mostly decompressed. No significant lymphadenopathy in the chest. No pleural effusions. Visualized portions of the upper abdominal organs demonstrate suggestion of hepatic cirrhosis and splenomegaly. This is incompletely evaluated.  Visualization of the lungs is limited due to respiratory motion artifact but there is extensive emphysema throughout the lungs with scattered peripheral fibrosis. Atelectasis in the lung bases. No focal consolidation or volume loss is suggested. No pneumothorax. Calcification in the right thyroid gland is stable.  Review of the MIP images confirms the above findings.  IMPRESSION: No evidence of significant pulmonary embolus. Extensive emphysematous changes in the lungs.   Electronically Signed   By: Burman Nieves M.D.   On: 09/19/2013 22:10           Doree Albee MD  Pager: [phone]

## 2013-09-19 NOTE — ED Notes (Signed)
Pt has had increasing shortness of breath and wheezing since Sunday.  Per EMS, pt had O2 saturations of 90% when they arrived, pt was given 125mg  Solumedrol, 10mg  Albuterol, 0.5mg  Atrovent en route and has some improvement, continues to remain short of breath.  EMS also discloses that they were getting elevated CO readings on pt, highest was 16%, pt is unaware of any CO leak.  Pt is also currently being treated for a UTI.

## 2013-09-20 ENCOUNTER — Inpatient Hospital Stay (HOSPITAL_COMMUNITY): Payer: Medicare HMO

## 2013-09-20 ENCOUNTER — Encounter (HOSPITAL_COMMUNITY): Payer: Self-pay

## 2013-09-20 DIAGNOSIS — N182 Chronic kidney disease, stage 2 (mild): Secondary | ICD-10-CM | POA: Diagnosis present

## 2013-09-20 DIAGNOSIS — N390 Urinary tract infection, site not specified: Secondary | ICD-10-CM

## 2013-09-20 DIAGNOSIS — E860 Dehydration: Secondary | ICD-10-CM | POA: Diagnosis present

## 2013-09-20 DIAGNOSIS — R0989 Other specified symptoms and signs involving the circulatory and respiratory systems: Secondary | ICD-10-CM

## 2013-09-20 DIAGNOSIS — J9610 Chronic respiratory failure, unspecified whether with hypoxia or hypercapnia: Secondary | ICD-10-CM

## 2013-09-20 DIAGNOSIS — I5032 Chronic diastolic (congestive) heart failure: Secondary | ICD-10-CM

## 2013-09-20 DIAGNOSIS — J449 Chronic obstructive pulmonary disease, unspecified: Secondary | ICD-10-CM

## 2013-09-20 DIAGNOSIS — R0902 Hypoxemia: Secondary | ICD-10-CM

## 2013-09-20 DIAGNOSIS — R0609 Other forms of dyspnea: Secondary | ICD-10-CM

## 2013-09-20 LAB — HEMOGLOBIN A1C
HEMOGLOBIN A1C: 5.8 % — AB (ref <5.7)
Mean Plasma Glucose: 120 mg/dL — ABNORMAL HIGH (ref <117)

## 2013-09-20 LAB — COMPREHENSIVE METABOLIC PANEL
ALBUMIN: 2.8 g/dL — AB (ref 3.5–5.2)
ALK PHOS: 144 U/L — AB (ref 39–117)
ALT: 28 U/L (ref 0–35)
AST: 34 U/L (ref 0–37)
BUN: 15 mg/dL (ref 6–23)
CO2: 17 mEq/L — ABNORMAL LOW (ref 19–32)
Calcium: 8.6 mg/dL (ref 8.4–10.5)
Chloride: 105 mEq/L (ref 96–112)
Creatinine, Ser: 0.76 mg/dL (ref 0.50–1.10)
GFR calc Af Amer: 90 mL/min — ABNORMAL LOW (ref >90)
GFR calc non Af Amer: 78 mL/min — ABNORMAL LOW (ref >90)
Glucose, Bld: 249 mg/dL — ABNORMAL HIGH (ref 70–99)
Potassium: 3.6 mEq/L — ABNORMAL LOW (ref 3.7–5.3)
Sodium: 140 mEq/L (ref 137–147)
TOTAL PROTEIN: 5.8 g/dL — AB (ref 6.0–8.3)
Total Bilirubin: 1.6 mg/dL — ABNORMAL HIGH (ref 0.3–1.2)

## 2013-09-20 LAB — CBC WITH DIFFERENTIAL/PLATELET
Basophils Absolute: 0 10*3/uL (ref 0.0–0.1)
Basophils Relative: 0 % (ref 0–1)
EOS PCT: 0 % (ref 0–5)
Eosinophils Absolute: 0 10*3/uL (ref 0.0–0.7)
HEMATOCRIT: 31.9 % — AB (ref 36.0–46.0)
Hemoglobin: 10.4 g/dL — ABNORMAL LOW (ref 12.0–15.0)
LYMPHS PCT: 6 % — AB (ref 12–46)
Lymphs Abs: 0.2 10*3/uL — ABNORMAL LOW (ref 0.7–4.0)
MCH: 29.3 pg (ref 26.0–34.0)
MCHC: 32.6 g/dL (ref 30.0–36.0)
MCV: 89.9 fL (ref 78.0–100.0)
MONO ABS: 0.1 10*3/uL (ref 0.1–1.0)
Monocytes Relative: 1 % — ABNORMAL LOW (ref 3–12)
Neutro Abs: 4.1 10*3/uL (ref 1.7–7.7)
Neutrophils Relative %: 93 % — ABNORMAL HIGH (ref 43–77)
Platelets: 60 10*3/uL — ABNORMAL LOW (ref 150–400)
RBC: 3.55 MIL/uL — ABNORMAL LOW (ref 3.87–5.11)
RDW: 14.6 % (ref 11.5–15.5)
WBC: 4.3 10*3/uL (ref 4.0–10.5)

## 2013-09-20 LAB — GLUCOSE, CAPILLARY
Glucose-Capillary: 167 mg/dL — ABNORMAL HIGH (ref 70–99)
Glucose-Capillary: 119 mg/dL — ABNORMAL HIGH (ref 70–99)

## 2013-09-20 LAB — TROPONIN I
Troponin I: <0.3 ng/mL (ref <0.30)
Troponin I: <0.3 ng/mL (ref <0.30)
Troponin I: <0.3 ng/mL (ref <0.30)

## 2013-09-20 LAB — MAGNESIUM: MAGNESIUM: 1.1 mg/dL — AB (ref 1.5–2.5)

## 2013-09-20 LAB — URINE CULTURE: Colony Count: >=100000

## 2013-09-20 LAB — SEDIMENTATION RATE: Sed Rate: 19 mm/hr (ref 0–22)

## 2013-09-20 LAB — MRSA PCR SCREENING: MRSA by PCR: NEGATIVE

## 2013-09-20 MED ORDER — INSULIN ASPART 100 UNIT/ML ~~LOC~~ SOLN
0.0000 [IU] | Freq: Three times a day (TID) | SUBCUTANEOUS | Status: DC
  Administered 2013-09-20: 2 [IU] via SUBCUTANEOUS

## 2013-09-20 MED ORDER — INSULIN ASPART 100 UNIT/ML ~~LOC~~ SOLN: 0.0000 [IU] | Freq: Every day | SUBCUTANEOUS | Status: DC

## 2013-09-20 MED ORDER — IPRATROPIUM-ALBUTEROL 0.5-2.5 (3) MG/3ML IN SOLN
3.0000 mL | Freq: Four times a day (QID) | RESPIRATORY_TRACT | Status: DC
  Administered 2013-09-20 – 2013-09-23 (×13): 3 mL via RESPIRATORY_TRACT
  Filled 2013-09-20 (×13): qty 3

## 2013-09-20 MED ORDER — ADULT MULTIVITAMIN W/MINERALS CH
1.0000 | ORAL_TABLET | Freq: Every day | ORAL | Status: DC
  Administered 2013-09-20 – 2013-09-23 (×4): 1 via ORAL
  Filled 2013-09-20 (×4): qty 1

## 2013-09-20 MED ORDER — IOHEXOL 300 MG/ML  SOLN: 80.0000 mL | Freq: Once | INTRAMUSCULAR | Status: AC | PRN

## 2013-09-20 MED ORDER — VITAMIN D3 25 MCG (1000 UNIT) PO TABS
5000.0000 [IU] | ORAL_TABLET | Freq: Every day | ORAL | Status: DC
Start: 2013-09-20 — End: 2013-09-23
  Administered 2013-09-20 – 2013-09-23 (×4): 5000 [IU] via ORAL
  Filled 2013-09-20 (×4): qty 5

## 2013-09-20 MED ORDER — ASPIRIN EC 81 MG PO TBEC
81.0000 mg | DELAYED_RELEASE_TABLET | Freq: Every day | ORAL | Status: DC
  Administered 2013-09-20 – 2013-09-23 (×4): 81 mg via ORAL
  Filled 2013-09-20 (×4): qty 1

## 2013-09-20 MED ORDER — IBUPROFEN 200 MG PO TABS
200.0000 mg | ORAL_TABLET | Freq: Four times a day (QID) | ORAL | Status: DC | PRN
  Filled 2013-09-20: qty 1

## 2013-09-20 NOTE — Progress Notes (Signed)
Utilization Review Completed.  

## 2013-09-20 NOTE — Progress Notes (Signed)
  Echocardiogram 2D Echocardiogram has been performed.  Brandi Cline 09/20/2013, 12:27 PM

## 2013-09-20 NOTE — Progress Notes (Signed)
[facility]  Brandi BersMary Ann Deguire ION:[id number] DOB: 04/30/1930 DOA: 09/19/2013 PCP: Ruthe Mannanalia Aron, MD  Time spent :  Brief narrative: 80 y.o. year old female with history of COPD on 2 L oxygen at home, coronary artery disease, hypertension, Crohn's disease. Presented with sepsis, ? acute respiratory failure with hypoxia/possible COPD exacerbation, UTI and LLQ abd pain. Patient seen by her PCP on 6/10 for a urinary tract infection. Was started on Macrobid. Patient states she woke up the following morning with subjective fevers and chills. She eventually took her temperature and noted it was ~101 F. Symptoms seemed to progressively worsening over the course of the day. She also developed worsening cough and wheezing. Cough and wheezing persisted despite inhaler use. Patient developed progressive fatigue as well as decreased appetite. Patient stated that she has diarrhea on a chronic basis in setting of Crohn's disease. Denied any worsening symptoms of bloody diarrhea. Has had some abdominal distention.  In the ER Max temp was 101.6. Heart rate in the 90s to 110s. Blood pressure in the 130s to 160s. He is satting in the upper 80s on 2 L. BAseline oxygen increased to 4 L with O2 sats improving to the mid 90s. WBC was 7.8. Hemoglobin 11.8 potassium 3.3. Urinalysis indicative of infection. D-dimer mildly elevated at 0.65. Pro BNP of 163. Troponin negative x1. Lactate within normal limits. CT angiogram negative for PE though with extensive emphysematous changes. Patient started on Rocephin for UTI coverage in the ER. Noted flora quinolone allergy   HPI/Subjective: Still with mild nausea and now pain more focal and in low back-denied any change in respiratory symptoms from baseline.  Assessment/Plan: Active Problems:   Sepsis/thrombocytopenia -due to UTI -tachypnea, fever and marginal BP has improved -FU on cx's obtained here -platelets were normal March 2015   Escherichia coli urinary tract infection -urine cx obtained at PCP positive for >100,000  Colonies E. Coli-sensitivities pending -repeat cx's obtained here including blood cx's    Chronic respiratory failure with hypoxia/COPD -seems stable and near baseline level of oxygen -no focal infiltrates to suggest infection and no wheezing to suggest COPD exacerbation-dc steroids    Chronic diastolic congestive heart failure, NYHA class 1 -compensated -holding Lasix    Dehydration -cont IVFs -allow clears    HYPERTENSION -BP initially was soft but now has increased -holding Cozaar    CROHN'S DISEASE -CT abd/pelvis without evidence of exacerbation and pt sx's not c/w acute exac. -no further "diarrhea" which pt describes as chronic and except for 3 doses of Macrobid no recent anbx's so can possibly dc stool for C. Diff PCR    CKD (chronic kidney disease), stage II -stable -cautious use of low dose NSAIDs foe pyelonephritis pain   Hyperglycemia -due to steroids -ck HgbA1c    Hepatic cirrhosis with splenic enlargement -?? NASH-seen on CT scan-results were not discussed with patient since noted during chart review after rounds -suspect needs additional eval -CT suggests splenic and gastric varices   Anemia of CD -hgb down to 10 after hydration-apparent baseline March 2015 12 -ck anemia panel if not yet done   DVT prophylaxis: Subcutaneous heparin but watch in setting of thrombocytopenia consider discontinue if platelets are 50,000 or less Code Status: Full Family Communication: Daughter at bedside Disposition Plan/Expected LOS: Stepdown   Consultants: None  Procedures: 2-D echocardiogram pending  Antibiotics: Cefepime 6/10 >>> Rocephin x 1 dose  Objective: Blood pressure 135/66, pulse 78, temperature 97.5 F (36.4 C),  temperature source Oral, resp. rate 16, height 5\' 4"  (1.626 m), weight 147 lb 14.9 oz (67.1 kg), SpO2 95.00%.  Intake/Output Summary (Last 24 hours) at  09/20/13 1244 Last data filed at 09/20/13 0600  Gross per 24 hour  Intake 1287.5 ml  Output    950 ml  Net  337.5 ml     Exam: General: No acute respiratory distress Lungs: Clear to auscultation bilaterally without wheezes or crackles, 3L Cardiovascular: Regular rate and rhythm without murmur gallop or rub normal S1 and S2, no peripheral edema or JVD Abdomen: Nontender, nondistended, soft, bowel sounds positive, no rebound, no ascites, no appreciable mass Genitourinary: Bilateral flank pain upon palpation-continues with subjective reports of dysuria Musculoskeletal: No significant cyanosis, clubbing of bilateral lower extremities   Scheduled Meds:  Scheduled Meds: . aspirin EC  81 mg Oral Daily  . ceFEPime (MAXIPIME) IV  1 g Intravenous Q12H  . cholecalciferol  5,000 Units Oral Daily  . [START ON 10/15/2013] cyanocobalamin  1,000 mcg Intramuscular Q30 days  . estradiol  0.25 mg Oral Daily  . heparin  5,000 Units Subcutaneous 3 times per day  . ipratropium-albuterol  3 mL Nebulization QID  . isosorbide mononitrate  60 mg Oral Daily  . loratadine  10 mg Oral Daily  . magnesium oxide  800 mg Oral Daily  . methylPREDNISolone (SOLU-MEDROL) injection  125 mg Intravenous Q12H  .  morphine injection  4 mg Intravenous Once  . multivitamin with minerals  1 tablet Oral Daily  . ondansetron  4 mg Intravenous Once  . pantoprazole  40 mg Oral Daily  . potassium chloride SA  20 mEq Oral Daily  . [START ON 09/21/2013] simvastatin  20 mg Oral Q M,W,F-1800   Continuous Infusions: . sodium chloride 1,000 mL (09/20/13 0732)    Data Reviewed: Basic Metabolic Panel:  Recent Labs Lab 09/19/13 2005 09/19/13 2345 09/20/13 0525  NA 136*  --  140  K 3.3*  --  3.6*  CL 98  --  105  CO2 18*  --  17*  GLUCOSE 130*  --  249*  BUN 14  --  15  CREATININE 0.78  --  0.76  CALCIUM 9.1  --  8.6  MG  --  1.1*  --    Liver Function Tests:  Recent Labs Lab 09/20/13 0525  AST 34  ALT 28    ALKPHOS 144*  BILITOT 1.6*  PROT 5.8*  ALBUMIN 2.8*   No results found for this basename: LIPASE, AMYLASE,  in the last 168 hours No results found for this basename: AMMONIA,  in the last 168 hours CBC:  Recent Labs Lab 09/19/13 2005 09/20/13 0525  WBC 7.8 4.3  NEUTROABS  --  4.1  HGB 11.8* 10.4*  HCT 35.2* 31.9*  MCV 89.6 89.9  PLT 72* 60*   Cardiac Enzymes:  Recent Labs Lab 09/19/13 2005 09/19/13 2345 09/20/13 0525 09/20/13 1011  TROPONINI <0.30 <0.30 <0.30 <0.30   BNP (last 3 results)  Recent Labs  01/30/13 1624 09/19/13 2005  PROBNP 41.0 163.3   CBG: No results found for this basename: GLUCAP,  in the last 168 hours  Recent Results (from the past 240 hour(s))  URINE CULTURE     Status: None   Collection Time    09/18/13 12:42 PM      Result Value Ref Range Status   Colony Count >=100,000 COLONIES/ML   Preliminary   Preliminary Report ESCHERICHIA COLI   Preliminary  MRSA PCR SCREENING  Status: None   Collection Time    09/20/13 12:05 AM      Result Value Ref Range Status   MRSA by PCR NEGATIVE  NEGATIVE Final   Comment:            The GeneXpert MRSA Assay (FDA     approved for NASAL specimens     only), is one component of a     comprehensive MRSA colonization     surveillance program. It is not     intended to diagnose MRSA     infection nor to guide or     monitor treatment for     MRSA infections.     Studies:  Recent x-ray studies have been reviewed in detail by the Attending Physician       Junious Silk, ANP [facility] Office  [phone] Pager [phone]   **If unable to reach the above provider after paging please contact the Flow Manager @ [phone]  On-Call/Text Page:      [email or web address]      password [email or web address]  If 7PM-7AM, please contact night-coverage [email or web address] Password [email or web address] 09/20/2013, 12:44 PM   LOS: 1 day    Briefly 80 year old female with history of COPD, CAD, hypertension, Crohn's disease  presented with acute respiratory failure with hypoxia, sepsis secondary to UTI/pyelonephritis, COPD exacerbation Patient seen and examined, feels improving, urine culture shows Escherichia coli, continue IV antibiotics and current management. Discussed in detail with patient's daughter at the bedside. I have personally examined the patient and reviewed the entire database. Agree with the above note and plan as outlined, any necessary changes made.  Bari Handshoe M.D. [facility] 09/20/2013, 1:45 PM Pager: [phone]  If 7PM-7AM, please contact night-coverage [email or web address] Password [email or web address]

## 2013-09-20 NOTE — Progress Notes (Signed)
Inpatient Diabetes Program Recommendations  AACE/ADA: New Consensus Statement on Inpatient Glycemic Control (2013)  Target Ranges:  Prepandial:   less than 140 mg/dL      Peak postprandial:   less than 180 mg/dL (1-2 hours)      Critically ill patients:  140 - 180 mg/dL   Reason for Assessment:   Results for KIMIYO, GRILLI ANN (MRN [id number]) as of 09/20/2013 11:34  Ref. Range 09/20/2013 05:25  Glucose Latest Range: 70-99 mg/dL 132 (H)   Diabetes history: None  Please consider checking CBG's tid with meals and HS and add Novolog moderate tid with meals while on IV Solumderol.  Thanks, Beryl Meager, RN, BC-ADM Inpatient Diabetes Coordinator Pager [phone]

## 2013-09-21 LAB — PROTIME-INR
INR: 1.63 — AB (ref 0.00–1.49)
Prothrombin Time: 18.9 seconds — ABNORMAL HIGH (ref 11.6–15.2)

## 2013-09-21 LAB — COMPREHENSIVE METABOLIC PANEL
ALBUMIN: 2.6 g/dL — AB (ref 3.5–5.2)
ALT: 25 U/L (ref 0–35)
AST: 29 U/L (ref 0–37)
Alkaline Phosphatase: 119 U/L — ABNORMAL HIGH (ref 39–117)
BUN: 24 mg/dL — ABNORMAL HIGH (ref 6–23)
CALCIUM: 8.8 mg/dL (ref 8.4–10.5)
CO2: 19 meq/L (ref 19–32)
CREATININE: 0.81 mg/dL (ref 0.50–1.10)
Chloride: 111 mEq/L (ref 96–112)
GFR calc Af Amer: 77 mL/min — ABNORMAL LOW (ref >90)
GFR calc non Af Amer: 67 mL/min — ABNORMAL LOW (ref >90)
Glucose, Bld: 122 mg/dL — ABNORMAL HIGH (ref 70–99)
Potassium: 3.9 mEq/L (ref 3.7–5.3)
Sodium: 143 mEq/L (ref 137–147)
Total Bilirubin: 1.1 mg/dL (ref 0.3–1.2)
Total Protein: 5.3 g/dL — ABNORMAL LOW (ref 6.0–8.3)

## 2013-09-21 LAB — RETICULOCYTES
RBC.: 3.27 MIL/uL — AB (ref 3.87–5.11)
Retic Count, Absolute: 39.2 10*3/uL (ref 19.0–186.0)
Retic Ct Pct: 1.2 % (ref 0.4–3.1)

## 2013-09-21 LAB — GLUCOSE, CAPILLARY
GLUCOSE-CAPILLARY: 109 mg/dL — AB (ref 70–99)
GLUCOSE-CAPILLARY: 108 mg/dL — AB (ref 70–99)
GLUCOSE-CAPILLARY: 103 mg/dL — AB (ref 70–99)
Glucose-Capillary: 83 mg/dL (ref 70–99)
Glucose-Capillary: 111 mg/dL — ABNORMAL HIGH (ref 70–99)

## 2013-09-21 LAB — URINE CULTURE
Colony Count: NO GROWTH
Culture: NO GROWTH

## 2013-09-21 LAB — FOLATE: Folate: >20 ng/mL

## 2013-09-21 LAB — CBC
HCT: 28.9 % — ABNORMAL LOW (ref 36.0–46.0)
Hemoglobin: 9.6 g/dL — ABNORMAL LOW (ref 12.0–15.0)
MCH: 29.4 pg (ref 26.0–34.0)
MCHC: 33.2 g/dL (ref 30.0–36.0)
MCV: 88.4 fL (ref 78.0–100.0)
PLATELETS: 71 10*3/uL — AB (ref 150–400)
RBC: 3.27 MIL/uL — AB (ref 3.87–5.11)
RDW: 14.8 % (ref 11.5–15.5)
WBC: 8.9 10*3/uL (ref 4.0–10.5)

## 2013-09-21 LAB — VITAMIN B12: VITAMIN B 12: 755 pg/mL (ref 211–911)

## 2013-09-21 LAB — CLOSTRIDIUM DIFFICILE BY PCR: Toxigenic C. Difficile by PCR: NEGATIVE

## 2013-09-21 LAB — IRON AND TIBC
Iron: 48 ug/dL (ref 42–135)
SATURATION RATIOS: 18 % — AB (ref 20–55)
TIBC: 265 ug/dL (ref 250–470)
UIBC: 217 ug/dL (ref 125–400)

## 2013-09-21 LAB — FERRITIN: Ferritin: 102 ng/mL (ref 10–291)

## 2013-09-21 MED ORDER — DEXTROSE 5 % IV SOLN
1.0000 g | INTRAVENOUS | Status: DC
  Administered 2013-09-21 – 2013-09-22 (×2): 1 g via INTRAVENOUS
  Filled 2013-09-21 (×3): qty 10

## 2013-09-21 MED ORDER — BIOTENE DRY MOUTH MT LIQD
15.0000 mL | Freq: Two times a day (BID) | OROMUCOSAL | Status: DC
Start: 2013-09-21 — End: 2013-09-22
  Administered 2013-09-21 (×2): 15 mL via OROMUCOSAL

## 2013-09-21 MED ORDER — ONDANSETRON HCL 4 MG/2ML IJ SOLN: 4.0000 mg | Freq: Four times a day (QID) | INTRAMUSCULAR | Status: DC | PRN

## 2013-09-21 NOTE — Progress Notes (Signed)
[facility]  Brandi Cline RUE:[id number] DOB: 06/26/1930 DOA: 09/19/2013 PCP: [name], MD  Time spent :  35 mins  Brief narrative: 80 year old female with history of COPD on 2 L oxygen at home, coronary artery disease, hypertension, and Crohn's disease who presented with fever, lethargy, and LLQ abd pain. Patient had been seen by her PCP on 6/10 for a urinary tract infection. Was started on Macrobid. Patient stated she woke up the following morning with subjective fevers and chills. She eventually took her temperature and noted it was ~101 F. Symptoms seemed to progressively worsening over the course of the day. She also developed worsening cough and wheezing. Cough and wheezing persisted despite inhaler use. Patient developed progressive fatigue as well as decreased appetite.   In the ER Max temp was 101.6. Heart rate in the 90s to 110s. Blood pressure in the 130s to 160s. WBC was 7.8. Hemoglobin 11.8 potassium 3.3. Urinalysis indicative of infection. D-dimer mildly elevated at 0.65. Pro BNP of 163. Troponin negative x1. Lactate within normal limits. CT angiogram negative for PE though with extensive emphysematous changes. Patient started on Rocephin for UTI coverage in the ER.   HPI/Subjective: Much more alert today - not as weak and appetite has improved  Assessment/Plan:  Sepsis  -due to UTI -tachypnea, fever and marginal BP improved to near baseline -FU urine cx's here with no growth but OP with pan sensitive E coli  Thrombocytopenia -platelets were normal March 2015 and have started to slowly increase  Escherichia coli urinary tract infection -urine cx obtained at PCP positive for >100,000  Colonies E. Coli - pan sensitive -cont anbx's but narrow to Rocephin  Chronic respiratory failure with hypoxia/COPD -seems stable and near baseline level of oxygen -no focal infiltrates to suggest infection and no wheezing to suggest COPD exacerbation  - dc steroids  Chronic diastolic congestive heart failure, NYHA class 1 -compensated -holding Lasix  Dehydration -cont IVFs but decrease to 50/hr since BP up and tolerating diet -allow clears  HYPERTENSION -BP initially was soft but now has increased -holding Cozaar  CROHN'S DISEASE -CT abd/pelvis without evidence of exacerbation and pt sx's not c/w acute exac. -no further "diarrhea" which pt describes as chronic and except for 3 doses of Macrobid no recent anbx's so can dc stool for C. Diff PCR  Chronic kidney disease, stage II -stable -cautious use of low dose NSAIDs for pyelonephritis pain  Hyperglycemia -due to steroids -ck HgbA1c  ?Hepatic cirrhosis with splenic enlargement (NEW) -?? NASH-seen on CT scan -needs additional OP eval -INR mildly elevated and albumin and TP low -CT suggests splenic and gastric varices  Anemia  -hgb down to 10 after hydration - apparent baseline March 2015 12 -ck anemia panel if not yet done  DVT prophylaxis: Subcutaneous heparin Code Status: Full Family Communication: Family at bedside Disposition Plan/Expected LOS: Transfer to floor - possible d/c home in AM   Consultants: None  Procedures: 2-D echocardiogram - pending  Antibiotics: Cefepime 6/10 >> 6/12 Rocephin 6/10 + 6/12 >>  Objective: Blood pressure 141/57, pulse 69, temperature 97.8 F (36.6 C), temperature source Oral, resp. rate 17, height 5\' 4"  (1.626 m), weight 147 lb 11.3 oz (67 kg), SpO2 98.00%.  Intake/Output Summary (Last 24 hours) at 09/21/13 1340 Last data filed at 09/21/13 1230  Gross per 24 hour  Intake   1130 ml  Output    225 ml  Net  905 ml   Exam: General: No acute respiratory distress Lungs: Clear to auscultation bilaterally without wheezes or crackles, 3L Cardiovascular: Regular rate and rhythm without murmur gallop or rub normal S1 and S2, no peripheral edema  Abdomen: Nontender, nondistended, soft, bowel sounds positive, no rebound, no  ascites, no appreciable mass Genitourinary: Bilateral flank pain upon palpation-continues with subjective reports of dysuria Musculoskeletal: No significant cyanosis, clubbing of bilateral lower extremities   Scheduled Meds:  Scheduled Meds: . antiseptic oral rinse  15 mL Mouth Rinse BID  . aspirin EC  81 mg Oral Daily  . ceFEPime (MAXIPIME) IV  1 g Intravenous Q12H  . cholecalciferol  5,000 Units Oral Daily  . [START ON 10/15/2013] cyanocobalamin  1,000 mcg Intramuscular Q30 days  . estradiol  0.25 mg Oral Daily  . heparin  5,000 Units Subcutaneous 3 times per day  . insulin aspart  0-5 Units Subcutaneous QHS  . insulin aspart  0-9 Units Subcutaneous TID WC  . ipratropium-albuterol  3 mL Nebulization QID  . isosorbide mononitrate  60 mg Oral Daily  . loratadine  10 mg Oral Daily  . magnesium oxide  800 mg Oral Daily  .  morphine injection  4 mg Intravenous Once  . multivitamin with minerals  1 tablet Oral Daily  . ondansetron  4 mg Intravenous Once  . pantoprazole  40 mg Oral Daily  . potassium chloride SA  20 mEq Oral Daily  . simvastatin  20 mg Oral Q M,W,F-1800   Data Reviewed: Basic Metabolic Panel:  Recent Labs Lab 09/19/13 2005 09/19/13 2345 09/20/13 0525 09/21/13 0417  NA 136*  --  140 143  K 3.3*  --  3.6* 3.9  CL 98  --  105 111  CO2 18*  --  17* 19  GLUCOSE 130*  --  249* 122*  BUN 14  --  15 24*  CREATININE 0.78  --  0.76 0.81  CALCIUM 9.1  --  8.6 8.8  MG  --  1.1*  --   --    Liver Function Tests:  Recent Labs Lab 09/20/13 0525 09/21/13 0417  AST 34 29  ALT 28 25  ALKPHOS 144* 119*  BILITOT 1.6* 1.1  PROT 5.8* 5.3*  ALBUMIN 2.8* 2.6*   CBC:  Recent Labs Lab 09/19/13 2005 09/20/13 0525 09/21/13 0417  WBC 7.8 4.3 8.9  NEUTROABS  --  4.1  --   HGB 11.8* 10.4* 9.6*  HCT 35.2* 31.9* 28.9*  MCV 89.6 89.9 88.4  PLT 72* 60* 71*   Cardiac Enzymes:  Recent Labs Lab 09/19/13 2005 09/19/13 2345 09/20/13 0525 09/20/13 1011  TROPONINI  <0.30 <0.30 <0.30 <0.30   BNP (last 3 results)  Recent Labs  01/30/13 1624 09/19/13 2005  PROBNP 41.0 163.3   CBG:  Recent Labs Lab 09/20/13 1550 09/20/13 2119 09/21/13 0800 09/21/13 1209  GLUCAP 167* 119* 83 109*    Recent Results (from the past 240 hour(s))  URINE CULTURE     Status: None   Collection Time    09/18/13 12:42 PM      Result Value Ref Range Status   Culture ESCHERICHIA COLI   Final   Colony Count >=100,000 COLONIES/ML   Final   Organism ID, Bacteria ESCHERICHIA COLI   Final  URINE CULTURE     Status: None   Collection Time    09/19/13  8:00 PM      Result Value Ref Range Status   Specimen Description URINE, CLEAN CATCH  Final   Special Requests NONE   Final   Culture  Setup Time     Final   Value: 09/19/2013 21:57     Performed at [facility]   Colony Count     Final   Value: NO GROWTH     Performed at [facility]   Culture     Final   Value: NO GROWTH     Performed at [facility]   Report Status 09/21/2013 FINAL   Final  CULTURE, BLOOD (ROUTINE X 2)     Status: None   Collection Time    09/19/13  8:09 PM      Result Value Ref Range Status   Specimen Description BLOOD RIGHT HAND   Final   Special Requests BOTTLES DRAWN AEROBIC ONLY 10CC   Final   Culture  Setup Time     Final   Value: 09/20/2013 01:06     Performed at [facility]   Culture     Final   Value:        BLOOD CULTURE RECEIVED NO GROWTH TO DATE CULTURE WILL BE HELD FOR 5 DAYS BEFORE ISSUING A FINAL NEGATIVE REPORT     Performed at [facility]   Report Status PENDING   Incomplete  CULTURE, BLOOD (ROUTINE X 2)     Status: None   Collection Time    09/19/13  9:28 PM      Result Value Ref Range Status   Specimen Description BLOOD RIGHT ARM   Final   Special Requests BOTTLES DRAWN AEROBIC AND ANAEROBIC 10CC   Final   Culture  Setup Time     Final   Value: 09/20/2013 01:06     Performed at [facility]   Culture     Final    Value:        BLOOD CULTURE RECEIVED NO GROWTH TO DATE CULTURE WILL BE HELD FOR 5 DAYS BEFORE ISSUING A FINAL NEGATIVE REPORT     Performed at [facility]   Report Status PENDING   Incomplete  MRSA PCR SCREENING     Status: None   Collection Time    09/20/13 12:05 AM      Result Value Ref Range Status   MRSA by PCR NEGATIVE  NEGATIVE Final   Comment:            The GeneXpert MRSA Assay (FDA     approved for NASAL specimens     only), is one component of a     comprehensive MRSA colonization     surveillance program. It is not     intended to diagnose MRSA     infection nor to guide or     monitor treatment for     MRSA infections.  CLOSTRIDIUM DIFFICILE BY PCR     Status: None   Collection Time    09/20/13  6:00 PM      Result Value Ref Range Status   C difficile by pcr NEGATIVE  NEGATIVE Final    Studies:  Recent x-ray studies have been reviewed in detail by the Attending Physician  Junious Silk, ANP [facility] Office  [phone] Pager [phone]  **If unable to reach the above provider after paging please contact the Flow Manager @ [phone]  On-Call/Text Page:      [email or web address]      password [email or web address]  If 7PM-7AM, please contact night-coverage [email or web address] Password [email or web address] 09/21/2013, 1:40 PM   LOS:  2 days   I have personally examined this patient and reviewed the entire database. I have reviewed the above note, made any necessary editorial changes, and agree with its content.  Lonia Blood, MD [facility]

## 2013-09-21 NOTE — Progress Notes (Signed)
Patient trasfered from 2C16 to [phone] via wheelchair; alert and oriented x 4; no complaints of pain; IV  in LFA running NS@100cc /hr; skin intact. Orient patient to room and unit;  gave patient care guide; instructed how to use the call bell and  fall risk precautions. Will continue to monitor the patient.

## 2013-09-22 LAB — URINE CULTURE
COLONY COUNT: NO GROWTH
CULTURE: NO GROWTH

## 2013-09-22 LAB — BASIC METABOLIC PANEL
BUN: 20 mg/dL (ref 6–23)
CHLORIDE: 108 meq/L (ref 96–112)
CO2: 21 meq/L (ref 19–32)
Calcium: 9 mg/dL (ref 8.4–10.5)
Creatinine, Ser: 0.73 mg/dL (ref 0.50–1.10)
GFR calc Af Amer: >90 mL/min (ref >90)
GFR, EST NON AFRICAN AMERICAN: 79 mL/min — AB (ref >90)
GLUCOSE: 97 mg/dL (ref 70–99)
POTASSIUM: 4.2 meq/L (ref 3.7–5.3)
Sodium: 141 mEq/L (ref 137–147)

## 2013-09-22 LAB — CBC
HEMATOCRIT: 29.9 % — AB (ref 36.0–46.0)
HEMOGLOBIN: 10 g/dL — AB (ref 12.0–15.0)
MCH: 29.8 pg (ref 26.0–34.0)
MCHC: 33.4 g/dL (ref 30.0–36.0)
MCV: 89 fL (ref 78.0–100.0)
Platelets: 77 10*3/uL — ABNORMAL LOW (ref 150–400)
RBC: 3.36 MIL/uL — AB (ref 3.87–5.11)
RDW: 15 % (ref 11.5–15.5)
WBC: 5.2 10*3/uL (ref 4.0–10.5)

## 2013-09-22 MED ORDER — BUDESONIDE-FORMOTEROL FUMARATE 160-4.5 MCG/ACT IN AERO
2.0000 | INHALATION_SPRAY | Freq: Two times a day (BID) | RESPIRATORY_TRACT | Status: DC
Start: 2013-09-22 — End: 2013-09-23
  Administered 2013-09-22 – 2013-09-23 (×2): 2 via RESPIRATORY_TRACT
  Filled 2013-09-22: qty 6

## 2013-09-22 MED ORDER — PROPRANOLOL HCL 10 MG PO TABS: 5.0000 mg | ORAL_TABLET | Freq: Three times a day (TID) | ORAL | Status: DC

## 2013-09-22 MED ORDER — PROPRANOLOL HCL 10 MG PO TABS
5.0000 mg | ORAL_TABLET | Freq: Three times a day (TID) | ORAL | Status: DC
  Administered 2013-09-22 – 2013-09-23 (×3): 5 mg via ORAL
  Filled 2013-09-22 (×5): qty 0.5

## 2013-09-22 MED ORDER — OMEGA-3-ACID ETHYL ESTERS 1 G PO CAPS
1.0000 g | ORAL_CAPSULE | Freq: Two times a day (BID) | ORAL | Status: DC
  Administered 2013-09-22 – 2013-09-23 (×2): 1 g via ORAL
  Filled 2013-09-22 (×4): qty 1

## 2013-09-22 MED ORDER — ALPRAZOLAM 0.5 MG PO TABS: 0.5000 mg | ORAL_TABLET | Freq: Two times a day (BID) | ORAL | Status: DC | PRN

## 2013-09-22 MED ORDER — ISOSORBIDE MONONITRATE ER 60 MG PO TB24: 60.0000 mg | ORAL_TABLET | Freq: Every day | ORAL | Status: DC

## 2013-09-22 MED ORDER — ESTRADIOL 0.5 MG PO TABS
0.2500 mg | ORAL_TABLET | Freq: Every day | ORAL | Status: DC
Start: 2013-09-22 — End: 2013-09-22

## 2013-09-22 MED ORDER — LORATADINE 10 MG PO TABS: 10.0000 mg | ORAL_TABLET | Freq: Every day | ORAL | Status: DC

## 2013-09-22 MED ORDER — OMEGA-3 FATTY ACIDS 1000 MG PO CAPS: 1.0000 g | ORAL_CAPSULE | Freq: Two times a day (BID) | ORAL | Status: DC

## 2013-09-22 MED ORDER — FUROSEMIDE 20 MG PO TABS
20.0000 mg | ORAL_TABLET | Freq: Every day | ORAL | Status: DC
  Administered 2013-09-22 – 2013-09-23 (×2): 20 mg via ORAL
  Filled 2013-09-22 (×2): qty 1

## 2013-09-22 NOTE — Progress Notes (Addendum)
[facility]  Brandi Cline ZOX:[id number] DOB: Aug 13, 1930 DOA: 09/19/2013 PCP: [name], MD  Brief narrative: 80 year old female with history of COPD on 2 L oxygen at home, coronary artery disease, hypertension, and Crohn's disease who presented with fever, lethargy, and LLQ abd pain. Patient had been seen by her PCP on 6/10 for a urinary tract infection. Was started on Macrobid. Patient stated she woke up the following morning with subjective fevers and chills. She eventually took her temperature and noted it was ~101 F. Symptoms seemed to progressively worsening over the course of the day. She also developed worsening cough and wheezing. Cough and wheezing persisted despite inhaler use. Patient developed progressive fatigue as well as decreased appetite.   In the ER Max temp was 101.6. Heart rate in the 90s to 110s. Blood pressure in the 130s to 160s. WBC was 7.8. Hemoglobin 11.8 potassium 3.3. Urinalysis indicative of infection. D-dimer mildly elevated at 0.65. Pro BNP of 163. Troponin negative x1. Lactate within normal limits. CT angiogram negative for PE though with extensive emphysematous changes. Patient started on Rocephin for UTI coverage in the ER.   HPI/Subjective: Feeling better, but remains somewhat weak.  Does not yet feel comfortable w/ thought of being at home alone.  Some subjective fever, but no abdom pain, sob, cp, or nausea.    Assessment/Plan:  Sepsis  -due to UTI -sepsis physiology has resolved  -FU urine cx's here with no growth but OP with pan sensitive E coli  Thrombocytopenia -platelets were normal March 2015 and have started to slowly increase - follow trend - likely simply due to E coli sepsis, but could be indicator of severe liver disease   Escherichia coli urinary tract infection -urine cx obtained at PCP positive for >100,000  Colonies E. Coli - pan sensitive -cont anbx's but narrow to Rocephin  Chronic respiratory  failure with hypoxia/COPD -seems stable and near baseline level of oxygen -no focal infiltrates to suggest infection and no wheezing to suggest COPD exacerbation - dc steroids  Chronic diastolic congestive heart failure, NYHA class 1 -compensated -resume Lasix  Dehydration -resolved w/ volume resuscitation   HYPERTENSION -BP initially was soft but now has increased -holding Cozaar  CROHN'S DISEASE -CT abd/pelvis without evidence of exacerbation and pt sx's not c/w acute exac -no further "diarrhea" which pt describes as chronic and except for 3 doses of Macrobid no recent anbx's so can dc stool for C. Diff PCR  Chronic kidney disease, stage II -stable -cautious use of low dose NSAIDs for pyelonephritis pain  Hyperglycemia -due to steroids -A1c 5.8 therefore not c/w DM   Hepatic cirrhosis with splenic enlargement (NEW) -probable NASH seen on CT scan - pt tells me she has been told she has a "fatty liver" previously  -f/u labs in AM - will need oupt monitoring - discussed at length w/ pt today  -INR mildly elevated and albumin and TP low - recheck in AM -CT suggests splenic and gastric varices - add propranolol if COPD can tolerate -check viral hepatitis panel  -advised pt to avoid APAP and EtOH  Anemia  -hgb down to 10 after hydration - apparent baseline March 2015 12.0 -anemia panel not c/w Fe deficiency   DVT prophylaxis: Subcutaneous heparin Code Status: Full Family Communication: no family present at time of exam Disposition Plan/Expected LOS: possible d/c home in AM   Consultants: None  Procedures: 2-D echocardiogram - EF 60-65% - no  WMA  Antibiotics: Cefepime 6/10 >> 6/12 Rocephin 6/10 + 6/12 >>  Objective: Blood pressure 120/60, pulse 79, temperature 98.3 F (36.8 C), temperature source Oral, resp. rate 16, height 5\' 4"  (1.626 m), weight 66.2 kg (145 lb 15.1 oz), SpO2 97.00%.  Intake/Output Summary (Last 24 hours) at 09/22/13 1401 Last data filed at  09/22/13 0945  Gross per 24 hour  Intake   3755 ml  Output      0 ml  Net   3755 ml   Exam: General: No acute respiratory distress Lungs: Clear to auscultation bilaterally without wheezes or crackles Cardiovascular: Regular rate and rhythm without murmur gallop or rub normal S1 and S2, no peripheral edema  Abdomen: Nontender, nondistended, soft, bowel sounds positive, no rebound, no ascites, no appreciable mass Musculoskeletal: No significant cyanosis, clubbing of bilateral lower extremities   Scheduled Meds:  Scheduled Meds: . aspirin EC  81 mg Oral Daily  . cefTRIAXone (ROCEPHIN)  IV  1 g Intravenous Q24H  . cholecalciferol  5,000 Units Oral Daily  . [START ON 10/15/2013] cyanocobalamin  1,000 mcg Intramuscular Q30 days  . estradiol  0.25 mg Oral Daily  . heparin  5,000 Units Subcutaneous 3 times per day  . ipratropium-albuterol  3 mL Nebulization QID  . isosorbide mononitrate  60 mg Oral Daily  . loratadine  10 mg Oral Daily  . magnesium oxide  800 mg Oral Daily  . multivitamin with minerals  1 tablet Oral Daily  . pantoprazole  40 mg Oral Daily  . potassium chloride SA  20 mEq Oral Daily  . simvastatin  20 mg Oral Q M,W,F-1800   Data Reviewed: Basic Metabolic Panel:  Recent Labs Lab 09/19/13 2005 09/19/13 2345 09/20/13 0525 09/21/13 0417 09/22/13 0511  NA 136*  --  140 143 141  K 3.3*  --  3.6* 3.9 4.2  CL 98  --  105 111 108  CO2 18*  --  17* 19 21  GLUCOSE 130*  --  249* 122* 97  BUN 14  --  15 24* 20  CREATININE 0.78  --  0.76 0.81 0.73  CALCIUM 9.1  --  8.6 8.8 9.0  MG  --  1.1*  --   --   --    Liver Function Tests:  Recent Labs Lab 09/20/13 0525 09/21/13 0417  AST 34 29  ALT 28 25  ALKPHOS 144* 119*  BILITOT 1.6* 1.1  PROT 5.8* 5.3*  ALBUMIN 2.8* 2.6*   CBC:  Recent Labs Lab 09/19/13 2005 09/20/13 0525 09/21/13 0417 09/22/13 0511  WBC 7.8 4.3 8.9 5.2  NEUTROABS  --  4.1  --   --   HGB 11.8* 10.4* 9.6* 10.0*  HCT 35.2* 31.9* 28.9*  29.9*  MCV 89.6 89.9 88.4 89.0  PLT 72* 60* 71* 77*   Cardiac Enzymes:  Recent Labs Lab 09/19/13 2005 09/19/13 2345 09/20/13 0525 09/20/13 1011  TROPONINI <0.30 <0.30 <0.30 <0.30   BNP (last 3 results)  Recent Labs  01/30/13 1624 09/19/13 2005  PROBNP 41.0 163.3   CBG:  Recent Labs Lab 09/21/13 0800 09/21/13 1209 09/21/13 1532 09/21/13 1730 09/21/13 2132  GLUCAP 83 109* 108* 111* 103*    Recent Results (from the past 240 hour(s))  URINE CULTURE     Status: None   Collection Time    09/18/13 12:42 PM      Result Value Ref Range Status   Culture ESCHERICHIA COLI   Final   Colony Count >=100,000 COLONIES/ML  Final   Organism ID, Bacteria ESCHERICHIA COLI   Final  URINE CULTURE     Status: None   Collection Time    09/19/13  8:00 PM      Result Value Ref Range Status   Specimen Description URINE, CLEAN CATCH   Final   Special Requests NONE   Final   Culture  Setup Time     Final   Value: 09/19/2013 21:57     Performed at [facility] Count     Final   Value: NO GROWTH     Performed at [facility]   Culture     Final   Value: NO GROWTH     Performed at [facility]   Report Status 09/21/2013 FINAL   Final  CULTURE, BLOOD (ROUTINE X 2)     Status: None   Collection Time    09/19/13  8:09 PM      Result Value Ref Range Status   Specimen Description BLOOD RIGHT HAND   Final   Special Requests BOTTLES DRAWN AEROBIC ONLY 10CC   Final   Culture  Setup Time     Final   Value: 09/20/2013 01:06     Performed at [facility]   Culture     Final   Value:        BLOOD CULTURE RECEIVED NO GROWTH TO DATE CULTURE WILL BE HELD FOR 5 DAYS BEFORE ISSUING A FINAL NEGATIVE REPORT     Performed at [facility]   Report Status PENDING   Incomplete  CULTURE, BLOOD (ROUTINE X 2)     Status: None   Collection Time    09/19/13  9:28 PM      Result Value Ref Range Status   Specimen Description BLOOD RIGHT ARM   Final    Special Requests BOTTLES DRAWN AEROBIC AND ANAEROBIC 10CC   Final   Culture  Setup Time     Final   Value: 09/20/2013 01:06     Performed at [facility]   Culture     Final   Value:        BLOOD CULTURE RECEIVED NO GROWTH TO DATE CULTURE WILL BE HELD FOR 5 DAYS BEFORE ISSUING A FINAL NEGATIVE REPORT     Performed at [facility]   Report Status PENDING   Incomplete  MRSA PCR SCREENING     Status: None   Collection Time    09/20/13 12:05 AM      Result Value Ref Range Status   MRSA by PCR NEGATIVE  NEGATIVE Final   Comment:            The GeneXpert MRSA Assay (FDA     approved for NASAL specimens     only), is one component of a     comprehensive MRSA colonization     surveillance program. It is not     intended to diagnose MRSA     infection nor to guide or     monitor treatment for     MRSA infections.  URINE CULTURE     Status: None   Collection Time    09/20/13  6:00 PM      Result Value Ref Range Status   Specimen Description URINE, CLEAN CATCH   Final   Special Requests NONE   Final   Culture  Setup Time     Final   Value: 09/21/2013 00:35  [facility]   Colony Count     Final   Value: NO GROWTH     [facility]   Culture     Final   Value: NO GROWTH     [facility]   Report Status 09/22/2013 FINAL   Final  CLOSTRIDIUM DIFFICILE BY PCR     Status: None   Collection Time    09/20/13  6:00 PM      Result Value Ref Range Status   C difficile by pcr NEGATIVE  NEGATIVE Final    Studies:  Recent x-ray studies have been reviewed in detail by the Attending Physician  Time spent :  35 mins  Lonia BloodJeffrey T. Devanie Galanti, MD [facility] For Consults/Admissions - Flow Manager - [phone] Office  [phone] Pager [phone]  On-Call/Text Page:      [email or web address]      password [email or web address]  09/22/2013, 2:01 PM   LOS: 3 days

## 2013-09-23 LAB — CBC
HCT: 29.5 % — ABNORMAL LOW (ref 36.0–46.0)
Hemoglobin: 9.8 g/dL — ABNORMAL LOW (ref 12.0–15.0)
MCH: 29.4 pg (ref 26.0–34.0)
MCHC: 33.2 g/dL (ref 30.0–36.0)
MCV: 88.6 fL (ref 78.0–100.0)
Platelets: 76 10*3/uL — ABNORMAL LOW (ref 150–400)
RBC: 3.33 MIL/uL — AB (ref 3.87–5.11)
RDW: 14.8 % (ref 11.5–15.5)
WBC: 3.7 10*3/uL — ABNORMAL LOW (ref 4.0–10.5)

## 2013-09-23 LAB — COMPREHENSIVE METABOLIC PANEL
ALT: 34 U/L (ref 0–35)
AST: 44 U/L — ABNORMAL HIGH (ref 0–37)
Albumin: 2.6 g/dL — ABNORMAL LOW (ref 3.5–5.2)
Alkaline Phosphatase: 191 U/L — ABNORMAL HIGH (ref 39–117)
BILIRUBIN TOTAL: 1.3 mg/dL — AB (ref 0.3–1.2)
BUN: 15 mg/dL (ref 6–23)
CHLORIDE: 107 meq/L (ref 96–112)
CO2: 21 mEq/L (ref 19–32)
Calcium: 9.1 mg/dL (ref 8.4–10.5)
Creatinine, Ser: 0.72 mg/dL (ref 0.50–1.10)
GFR, EST NON AFRICAN AMERICAN: 79 mL/min — AB (ref >90)
GLUCOSE: 88 mg/dL (ref 70–99)
Potassium: 4 mEq/L (ref 3.7–5.3)
Sodium: 142 mEq/L (ref 137–147)
Total Protein: 5.5 g/dL — ABNORMAL LOW (ref 6.0–8.3)

## 2013-09-23 LAB — PROTIME-INR
INR: 1.3 (ref 0.00–1.49)
Prothrombin Time: 15.9 seconds — ABNORMAL HIGH (ref 11.6–15.2)

## 2013-09-23 LAB — HEPATITIS PANEL, ACUTE
HCV Ab: NEGATIVE
Hep A IgM: NONREACTIVE
Hep B C IgM: NONREACTIVE
Hepatitis B Surface Ag: NEGATIVE

## 2013-09-23 LAB — AMMONIA: Ammonia: 64 umol/L — ABNORMAL HIGH (ref 11–60)

## 2013-09-23 LAB — APTT: aPTT: 62 seconds — ABNORMAL HIGH (ref 24–37)

## 2013-09-23 MED ORDER — CEFUROXIME AXETIL 500 MG PO TABS: 500.0000 mg | ORAL_TABLET | Freq: Two times a day (BID) | ORAL | Status: DC

## 2013-09-23 MED ORDER — PROPRANOLOL HCL 10 MG PO TABS: 5.0000 mg | ORAL_TABLET | Freq: Three times a day (TID) | ORAL | Status: DC

## 2013-09-23 NOTE — Discharge Summary (Signed)
DISCHARGE SUMMARY  Brandi Cline  MR#: [id number]  DOB:09/28/1930  Date of Admission: 09/19/2013 Date of Discharge: 09/23/2013  Attending Physician:Brandi Cline  Patient's Brandi [name], MD  Consults: None  Disposition: D/C home   Follow-up Appts:     Follow-up Information   Follow up with Brandi Mannan, MD. Schedule an appointment as soon as possible for a visit in 1 week.   Specialty:  Family Medicine   Contact information:   [address] [place] [address] [phone]      Tests Needing Follow-up: -)consideration could be given to testing the pt for alpha-1 antitrypsin deficiency (see discussion below) -)routine monitoring of liver fxn/cirrhosis is suggested, w/ consideration being given to more extensive outpt w/u - to include tolerance of propranolol, plt coutns, coags, protein/albumin, and transaminases  Discharge Diagnoses: Sepsis  Thrombocytopenia  Escherichia coli urinary tract infection / pyelonephritis  Chronic respiratory failure with hypoxia/COPD  Chronic diastolic congestive heart failure, NYHA class 1  Dehydration  HYPERTENSION  CROHN'S DISEASE  Chronic kidney disease, stage II   Cryptogenic Hepatic cirrhosis with splenic enlargement - newly appreciated  Anemia   Initial presentation: 80 year old female with history of COPD on 2 L oxygen at home, coronary artery disease, hypertension, and Crohn's disease who presented with fever, lethargy, and LLQ abd pain. Patient had been seen by her PCP on 6/10 for a urinary tract infection. Was started on Macrobid. Patient stated she woke up the following morning with subjective fevers and chills. She eventually took her temperature and noted it was ~101 F. Symptoms seemed to progressively worsening over the course of the day. She also developed worsening cough and wheezing. Cough and wheezing persisted despite inhaler use. Patient developed progressive fatigue as well as decreased appetite.  In the ER  Max temp was 101.6. Heart rate in the 90s to 110s. Blood pressure in the 130s to 160s. WBC was 7.8. Hemoglobin 11.8 potassium 3.3. Urinalysis indicative of infection. D-dimer mildly elevated at 0.65. Pro BNP of 163. Troponin negative x1. Lactate within normal limits. CT angiogram negative for PE though with extensive emphysematous changes. Patient started on Rocephin for UTI coverage in the ER.   Hospital Course:  Sepsis  -due to UTI  -sepsis physiology resolved  -FU urine cx's here with no growth but OP with pan sensitive E coli   Thrombocytopenia  -platelets were normal March 2015 and have started to slowly increase - follow trend - likely simply due to E coli sepsis, but could be indicator of severe liver disease   Escherichia coli urinary tract infection  -urine cx obtained at PCP positive for >100,000 [place] E. Coli - pan sensitive  -cont anbx's to compete a 10 day tx course   Chronic respiratory failure with hypoxia/COPD  -stable and near baseline level of oxygen  -no focal infiltrates to suggest infection and no wheezing to suggest COPD exacerbation   Cryptogenic Hepatic cirrhosis with splenic enlargement - newly appreciated  -?NASH seen on CT scan - pt tells me she has been told she has a "fatty liver" previously  -will need oupt monitoring - discussed at length w/ pt on 2 different occasions  -INR mildly elevated initially (though normalized before d/c) and albumin and TP low  -CT suggests splenic and gastric varices - added propranolol and is thus far tolerating w/o difficulty  -viral hepatitis panel negative -advised pt to avoid APAP and EtOH  -pr reports that both of her sisters have similar liver disease as well (  one was referred to [facility] for bx but decided against a w/u) - pt states she was never a heavy smoker - this raises a question of alpha-1 antitrypsin deficiency as a unifying diagnosis - I have discussed this with the pt, and the fact that a diagnosis at this point  would be primarily academic for her, but could prove beneficial to other family members - she has decided to discuss it further with her PCP   Chronic diastolic congestive heart failure, NYHA class 1  -compensated  -resumed Lasix   Dehydration  -resolved w/ volume resuscitation   HYPERTENSION  -BP initially was soft but has normalized w/ med tx at time of d/c   CROHN'S DISEASE  -CT abd/pelvis without evidence of exacerbation and pt sx's not c/w acute exac  -no further "diarrhea" which pt describes as chronic and except for 3 doses of Macrobid no recent anbx's so can dc stool for C. Diff PCR   Chronic kidney disease, stage II  -stable  -cautious use of low dose NSAIDs for pyelonephritis pain   Hyperglycemia  -due to steroids  -A1c 5.8 therefore not c/w DM   Anemia  -hgb down to 10 after hydration - apparent baseline March 2015 12.0  -anemia panel not c/w Fe deficiency  -follow as outpt - possible splenic sequestration related to splenomegaly related to portal HTN    Medication List    STOP taking these medications       losartan 50 MG tablet  Commonly known as:  COZAAR     nitrofurantoin (macrocrystal-monohydrate) 100 MG capsule  Commonly known as:  MACROBID      TAKE these medications       albuterol (2.5 MG/3ML) 0.083% nebulizer solution  Commonly known as:  PROVENTIL  Take 2.5 mg by nebulization every 4 (four) hours as needed. Shortness of breath     albuterol 108 (90 BASE) MCG/ACT inhaler  Commonly known as:  PROVENTIL HFA;VENTOLIN HFA  Inhale 2 puffs into the lungs daily as needed for wheezing or shortness of breath.     ALPRAZolam 0.5 MG tablet  Commonly known as:  XANAX  Take 0.5 mg by mouth 2 (two) times daily as needed for anxiety. For anxiety     aspirin 81 MG tablet  Take 81 mg by mouth daily.     budesonide-formoterol 160-4.5 MCG/ACT inhaler  Commonly known as:  SYMBICORT  Inhale 2 puffs into the lungs 2 (two) times daily.     cefUROXime 500 MG  tablet  Commonly known as:  CEFTIN  Take 1 tablet (500 mg total) by mouth 2 (two) times daily with a meal.     CO Q 10 PO  Take 1 tablet by mouth daily.     cyanocobalamin 1000 MCG/ML injection  Commonly known as:  (VITAMIN B-12)  Inject 1,000 mcg into the muscle every 30 (thirty) days.     estradiol 0.5 MG tablet  Commonly known as:  ESTRACE  Take 0.25 mg by mouth daily.     fish oil-omega-3 fatty acids 1000 MG capsule  Take 1 g by mouth 2 (two) times daily.     furosemide 20 MG tablet  Commonly known as:  LASIX  Take 20 mg by mouth daily.     isosorbide mononitrate 30 MG 24 hr tablet  Commonly known as:  IMDUR  Take 60 mg by mouth daily.     loratadine 10 MG tablet  Commonly known as:  CLARITIN  Take 10 mg by  mouth daily.     magnesium oxide 400 MG tablet  Commonly known as:  MAG-OX  Take 400 mg by mouth daily.     multivitamin tablet  Take 1 tablet by mouth daily.     nitroGLYCERIN 0.4 MG SL tablet  Commonly known as:  NITROSTAT  Place 1 tablet (0.4 mg total) under the tongue every 5 (five) minutes as needed. Chest pain     omeprazole 20 MG capsule  Commonly known as:  PRILOSEC  Take 20 mg by mouth daily.     potassium chloride SA 20 MEQ tablet  Commonly known as:  K-DUR,KLOR-CON  Take 20 mEq by mouth 2 (two) times daily. Alternate taking 2 one day then 1 the next     propranolol 10 MG tablet  Commonly known as:  INDERAL  Take 0.5 tablets (5 mg total) by mouth 3 (three) times daily.     simvastatin 20 MG tablet  Commonly known as:  ZOCOR  Take 20 mg by mouth 2 (two) times a week. Monday, Wednesday, and Friday     Vitamin D-3 5000 UNITS Tabs  Take 1 tablet by mouth daily.     ZINC ACETATE PO  Take 1 tablet by mouth daily.       Day of Discharge BP 129/70  Pulse 72  Temp(Src) 98.1 F (36.7 C) (Oral)  Resp 20  Ht 5\' 4"  (1.626 m)  Wt 71.4 kg (157 lb 6.5 oz)  BMI 27.01 kg/m2  SpO2 96%  Physical Exam: General: No acute respiratory  distress Lungs: Clear to auscultation bilaterally without wheezes or crackles Cardiovascular: Regular rate and rhythm without murmur gallop or rub normal S1 and S2 Abdomen: Nontender, nondistended, soft, bowel sounds positive, no rebound, no ascites, no appreciable mass Extremities: No significant cyanosis, clubbing, or edema bilateral lower extremities  Results for orders placed during the hospital encounter of 09/19/13 (from the past 24 hour(s))  HEPATITIS PANEL, ACUTE     Status: None   Collection Time    09/22/13  4:21 PM      Result Value Ref Range   Hepatitis B Surface Ag NEGATIVE  NEGATIVE   HCV Ab NEGATIVE  NEGATIVE   Hep A IgM NON REACTIVE  NON REACTIVE   Hep B C IgM NON REACTIVE  NON REACTIVE  COMPREHENSIVE METABOLIC PANEL     Status: Abnormal   Collection Time    09/23/13  5:58 AM      Result Value Ref Range   Sodium 142  137 - 147 mEq/L   Potassium 4.0  3.7 - 5.3 mEq/L   Chloride 107  96 - 112 mEq/L   CO2 21  19 - 32 mEq/L   Glucose, Bld 88  70 - 99 mg/dL   BUN 15  6 - 23 mg/dL   Creatinine, Ser 2.39  0.50 - 1.10 mg/dL   Calcium 9.1  8.4 - 53.2 mg/dL   Total Protein 5.5 (*) 6.0 - 8.3 g/dL   Albumin 2.6 (*) 3.5 - 5.2 g/dL   AST 44 (*) 0 - 37 U/L   ALT 34  0 - 35 U/L   Alkaline Phosphatase 191 (*) 39 - 117 U/L   Total Bilirubin 1.3 (*) 0.3 - 1.2 mg/dL   GFR calc non Af Amer 79 (*) >90 mL/min   GFR calc Af Amer >90  >90 mL/min  PROTIME-INR     Status: Abnormal   Collection Time    09/23/13  5:58 AM  Result Value Ref Range   Prothrombin Time 15.9 (*) 11.6 - 15.2 seconds   INR 1.30  0.00 - 1.49  APTT     Status: Abnormal   Collection Time    09/23/13  5:58 AM      Result Value Ref Range   aPTT 62 (*) 24 - 37 seconds  AMMONIA     Status: Abnormal   Collection Time    09/23/13  5:58 AM      Result Value Ref Range   Ammonia 64 (*) 11 - 60 umol/L  CBC     Status: Abnormal   Collection Time    09/23/13  5:58 AM      Result Value Ref Range   WBC 3.7 (*) 4.0  - 10.5 K/uL   RBC 3.33 (*) 3.87 - 5.11 MIL/uL   Hemoglobin 9.8 (*) 12.0 - 15.0 g/dL   HCT 16.129.5 (*) 09.636.0 - 04.546.0 %   MCV 88.6  78.0 - 100.0 fL   MCH 29.4  26.0 - 34.0 pg   MCHC 33.2  30.0 - 36.0 g/dL   RDW 40.914.8  81.111.5 - 91.415.5 %   Platelets 76 (*) 150 - 400 K/uL    Time spent in discharge (includes decision making & examination of pt): >30 minutes  09/23/2013, 2:42 PM   Lonia BloodJeffrey Cline. McClung, MD [facility] Office  [phone] Pager [phone]  On-Call/Text Page:      [email or web address]      password [email or web address]

## 2013-09-23 NOTE — Progress Notes (Signed)
DC home with daughter, pt verbally understood DC instructions, no questions ask

## 2013-09-23 NOTE — Discharge Instructions (Signed)

## 2013-09-26 LAB — CULTURE, BLOOD (ROUTINE X 2)
CULTURE: NO GROWTH
Culture: NO GROWTH

## 2013-10-03 ENCOUNTER — Encounter: Payer: Self-pay | Admitting: Interventional Cardiology

## 2013-10-03 ENCOUNTER — Ambulatory Visit (INDEPENDENT_AMBULATORY_CARE_PROVIDER_SITE_OTHER): Payer: Medicare HMO | Admitting: Family Medicine

## 2013-10-03 ENCOUNTER — Encounter: Payer: Self-pay | Admitting: Family Medicine

## 2013-10-03 VITALS — BP 128/68 | HR 70 | Temp 97.8°F | Wt 144.8 lb

## 2013-10-03 DIAGNOSIS — J449 Chronic obstructive pulmonary disease, unspecified: Secondary | ICD-10-CM

## 2013-10-03 DIAGNOSIS — A4151 Sepsis due to Escherichia coli [E. coli]: Secondary | ICD-10-CM

## 2013-10-03 DIAGNOSIS — A419 Sepsis, unspecified organism: Secondary | ICD-10-CM

## 2013-10-03 DIAGNOSIS — I10 Essential (primary) hypertension: Secondary | ICD-10-CM

## 2013-10-03 DIAGNOSIS — K7460 Unspecified cirrhosis of liver: Secondary | ICD-10-CM | POA: Insufficient documentation

## 2013-10-03 LAB — COMPREHENSIVE METABOLIC PANEL
ALT: 41 U/L — ABNORMAL HIGH (ref 0–35)
AST: 60 U/L — AB (ref 0–37)
Albumin: 4 g/dL (ref 3.5–5.2)
Alkaline Phosphatase: 216 U/L — ABNORMAL HIGH (ref 39–117)
BUN: 11 mg/dL (ref 6–23)
CO2: 28 mEq/L (ref 19–32)
CREATININE: 0.8 mg/dL (ref 0.4–1.2)
Calcium: 9.5 mg/dL (ref 8.4–10.5)
Chloride: 102 mEq/L (ref 96–112)
GFR: 78.9 mL/min (ref >60.00)
Glucose, Bld: 112 mg/dL — ABNORMAL HIGH (ref 70–99)
Potassium: 4.1 mEq/L (ref 3.5–5.1)
Sodium: 137 mEq/L (ref 135–145)
Total Bilirubin: 1.6 mg/dL — ABNORMAL HIGH (ref 0.2–1.2)
Total Protein: 7.2 g/dL (ref 6.0–8.3)

## 2013-10-03 LAB — CBC WITH DIFFERENTIAL/PLATELET
BASOS PCT: 0.4 % (ref 0.0–3.0)
Basophils Absolute: 0 10*3/uL (ref 0.0–0.1)
EOS PCT: 2.6 % (ref 0.0–5.0)
Eosinophils Absolute: 0.2 10*3/uL (ref 0.0–0.7)
HCT: 37.4 % (ref 36.0–46.0)
HEMOGLOBIN: 12.4 g/dL (ref 12.0–15.0)
Lymphocytes Relative: 14.8 % (ref 12.0–46.0)
Lymphs Abs: 1.2 10*3/uL (ref 0.7–4.0)
MCHC: 33.1 g/dL (ref 30.0–36.0)
MCV: 91.4 fl (ref 78.0–100.0)
Monocytes Absolute: 0.7 10*3/uL (ref 0.1–1.0)
Monocytes Relative: 8.6 % (ref 3.0–12.0)
NEUTROS ABS: 5.7 10*3/uL (ref 1.4–7.7)
Neutrophils Relative %: 73.6 % (ref 43.0–77.0)
Platelets: 140 10*3/uL — ABNORMAL LOW (ref 150.0–400.0)
RBC: 4.09 Mil/uL (ref 3.87–5.11)
RDW: 15.3 % (ref 11.5–15.5)
WBC: 7.8 10*3/uL (ref 4.0–10.5)

## 2013-10-03 MED ORDER — ALPRAZOLAM 0.5 MG PO TABS: 0.5000 mg | ORAL_TABLET | Freq: Three times a day (TID) | ORAL | Status: DC | PRN

## 2013-10-03 NOTE — Assessment & Plan Note (Signed)
Continue Inderall.

## 2013-10-03 NOTE — Patient Instructions (Signed)
Please stop taking your losartan. Come see me in 2 weeks.

## 2013-10-03 NOTE — Progress Notes (Signed)
Pre visit review using our clinic review tool, if applicable. No additional management support is needed unless otherwise documented below in the visit note. 

## 2013-10-03 NOTE — Assessment & Plan Note (Signed)
Followed by Dr. Maple HudsonYoung. She does want testing for alpha 1 antitripsin. Orders Placed This Encounter  Procedures  . Alpha-1-Antitrypsin  . Comprehensive metabolic panel  . CBC with Differential

## 2013-10-03 NOTE — Assessment & Plan Note (Signed)
D/c losartan now that she is on inderall. Follow up in 2 weeks.

## 2013-10-03 NOTE — Progress Notes (Signed)
Subjective:   Patient ID: Brandi Cline, female    DOB: [birth year], 80 y.o.   MRN: [id number]  Brandi Cline is a pleasant 80 y.o. year old female who presents to clinic today with Hospitalization Follow-up  on 10/03/2013  HPI: Admitted to [facility] 6/10- 09/23/2013. Notes reviewed.  Was seen by Brandi Cline for UTI symptoms on 6/9.  Started on Macrobid. Presented to ER with acute onset the following morning of fevers and chills- 101. In ER max temp was 101.6, tachycardic into 100s and BP in 130s- 160s systolic. WBC was 7.8. UA pos D dimer elevated to 0.65 Troponins were neg. CT angio neg for PE but did show chronic COPD changes. Started on Rocephin in ER and admitted with sepsis.  Sepsis- due to UTI, resolved.  Urine Cx in office grew pan sensitive >100, 000 E. Coli- cx in hospital showed no growth  . Thrombocytopenia- new findings, no bleeding. Lab Results  Component Value Date   WBC 3.7* 09/23/2013   HGB 9.8* 09/23/2013   HCT 29.5* 09/23/2013   MCV 88.6 09/23/2013   PLT 76* 09/23/2013   COPD- remained stable throughout admission- remained on baseline level of supplemental O2. No infiltrates on CT.  She is needing to take xanax more frequently on these hot days due to air hunger  Newly found cirrhosis with splenic enlargement- hospital ist concerned for alpha 1 antitrypsin. CT did show varices so was started on propranolol.  Still taking her losartan as well.  Anemia-  Normocytic.  ? Related to splenic sequestration vs chronic disease.   CBC    Component Value Date/Time   WBC 3.7* 09/23/2013 0558   RBC 3.33* 09/23/2013 0558   RBC 3.27* 09/21/2013 0417   HGB 9.8* 09/23/2013 0558   HCT 29.5* 09/23/2013 0558   PLT 76* 09/23/2013 0558   MCV 88.6 09/23/2013 0558   MCH 29.4 09/23/2013 0558   MCHC 33.2 09/23/2013 0558   RDW 14.8 09/23/2013 0558   LYMPHSABS 0.2* 09/20/2013 0525   MONOABS 0.1 09/20/2013 0525   EOSABS 0.0 09/20/2013 0525   BASOSABS 0.0 09/20/2013 0525   Current Outpatient  Prescriptions on File Prior to Visit  Medication Sig Dispense Refill  . albuterol (PROVENTIL HFA;VENTOLIN HFA) 108 (90 BASE) MCG/ACT inhaler Inhale 2 puffs into the lungs daily as needed for wheezing or shortness of breath.      [name] albuterol (PROVENTIL) (2.5 MG/3ML) 0.083% nebulizer solution Take 2.5 mg by nebulization every 4 (four) hours as needed. Shortness of breath      . ALPRAZolam (XANAX) 0.5 MG tablet Take 0.5 mg by mouth 2 (two) times daily as needed for anxiety. For anxiety      . aspirin 81 MG tablet Take 81 mg by mouth daily.      . budesonide-formoterol (SYMBICORT) 160-4.5 MCG/ACT inhaler Inhale 2 puffs into the lungs 2 (two) times daily.      . Cholecalciferol (VITAMIN D-3) 5000 UNITS TABS Take 1 tablet by mouth daily.      . Coenzyme Q10 (CO Q 10 PO) Take 2 tablets by mouth daily.       . cyanocobalamin (,VITAMIN B-12,) 1000 MCG/ML injection Inject 1,000 mcg into the muscle every 30 (thirty) days.      [name] estradiol (ESTRACE) 0.5 MG tablet Take 0.25 mg by mouth daily.      . fish oil-omega-3 fatty acids 1000 MG capsule Take 1 g by mouth 2 (two) times daily.       [name]  furosemide (LASIX) 20 MG tablet Take 20 mg by mouth daily.      . isosorbide mononitrate (IMDUR) 30 MG 24 hr tablet Take 60 mg by mouth daily.      [name]. loratadine (CLARITIN) 10 MG tablet Take 10 mg by mouth daily.      . magnesium oxide (MAG-OX) 400 MG tablet Take 400 mg by mouth daily.       . Multiple Vitamin (MULTIVITAMIN) tablet Take 1 tablet by mouth daily.       . nitroGLYCERIN (NITROSTAT) 0.4 MG SL tablet Place 1 tablet (0.4 mg total) under the tongue every 5 (five) minutes as needed. Chest pain  25 tablet  3  . omeprazole (PRILOSEC) 20 MG capsule Take 20 mg by mouth daily.      . potassium chloride SA (K-DUR,KLOR-CON) 20 MEQ tablet Take 20 mEq by mouth 2 (two) times daily. Alternate taking 2 one day then 1 the next      . propranolol (INDERAL) 10 MG tablet Take 0.5 tablets (5 mg total) by mouth 3 (three) times daily.   30 tablet  0  . simvastatin (ZOCOR) 20 MG tablet Take 20 mg by mouth 2 (two) times a week. Monday, Wednesday, and Friday      . Zinc Acetate, Oral, (ZINC ACETATE PO) Take 1 tablet by mouth daily.       No current facility-administered medications on file prior to visit.    Allergies  Allergen Reactions  . Avelox [Moxifloxacin Hcl In Nacl] Shortness Of Breath, Swelling and Rash  . Theophyllines     Slight increased heart rate  . Ciprofloxacin   . Codeine Other (See Comments)    Couldn't breath  . Milk-Related Compounds Other (See Comments)    Hurts stomach..   . Lisinopril Rash    Past Medical History  Diagnosis Date  . Dyspnea   . Esophageal reflux   . CAD (coronary artery disease)   . Hypertension   . Crohn's disease   . Allergic rhinitis   . COPD (chronic obstructive pulmonary disease)   . History of kidney stones   . History of shingles   . Trigeminal neuralgia   . Family history of anesthesia complication     " SISTER HAD BAD FEELINGS "  . Anginal pain   . Anxiety     ' JUST FOR MY BREATHING "  . Arthritis     mild in hands  . Hyperlipidemia     Past Surgical History  Procedure Laterality Date  . Coronary stents    . Lithotripsy    . Ureteral stent placement    . Cholecystectomy    . Total abdominal hysterectomy    . Partial resections large and small bowel for crohn's    . Breast lumps benign      Family History  Problem Relation Age of Onset  . Breast cancer Sister   . COPD Sister   . Stroke Mother   . Prostate cancer Father   . Prostate cancer Brother   . Heart attack Brother     History   Social History  . Marital Status: Widowed    Spouse Name: N/A    Number of Children: N/A  . Years of Education: N/A   Occupational History  . PT [employer]    Social History Main Topics  . Smoking status: Former Smoker -- 1.00 packs/day for 30 years    Types: Cigarettes    Quit date: 04/12/1990  .  Smokeless tobacco: Never Used  . Alcohol Use:  No  . Drug Use: No  . Sexual Activity: No   Other Topics Concern  . Not on file   Social History Narrative   Has a living will and HPOA- Brandi Cline.   Would desire CPR.   Would not want prolonged life support, would not want feeding tubes.         The PMH, PSH, Social History, Family History, Medications, and allergies have been reviewed in [facility], and have been updated if relevant.    Review of Systems    See HPI Objective:    BP 128/68  Pulse 70  Temp(Src) 97.8 F (36.6 C) (Oral)  Wt 144 lb 12 oz (65.658 kg)  SpO2 98%   Physical Exam   General: No acute respiratory distress  Lungs: Clear to auscultation bilaterally without wheezes or crackles  Cardiovascular: Regular rate and rhythm without murmur gallop or rub normal S1 and S2  Abdomen: Nontender, nondistended, soft, bowel sounds positive, no rebound, no ascites, no appreciable mass  Extremities: No significant cyanosis, clubbing, or edema bilateral lower extremities Psych: good eye contact, not anxious or depressed appearing     Assessment & Plan:   Sepsis due to Escherichia coli  COPD  Hepatic cirrhosis, unspecified hepatic cirrhosis type No Follow-up on file.

## 2013-10-03 NOTE — Assessment & Plan Note (Signed)
Resolved. Likely E coli sepsis.

## 2013-10-05 LAB — ALPHA-1-ANTITRYPSIN: A-1 Antitrypsin, Ser: 203 mg/dL — ABNORMAL HIGH (ref 83–199)

## 2013-10-06 ENCOUNTER — Encounter: Payer: Self-pay | Admitting: Family Medicine

## 2013-10-25 ENCOUNTER — Encounter: Payer: Self-pay | Admitting: Family Medicine

## 2013-10-25 ENCOUNTER — Ambulatory Visit (INDEPENDENT_AMBULATORY_CARE_PROVIDER_SITE_OTHER): Payer: Medicare HMO | Admitting: Family Medicine

## 2013-10-25 VITALS — BP 124/72 | HR 85 | Temp 97.4°F | Wt 145.0 lb

## 2013-10-25 DIAGNOSIS — D62 Acute posthemorrhagic anemia: Secondary | ICD-10-CM

## 2013-10-25 DIAGNOSIS — I10 Essential (primary) hypertension: Secondary | ICD-10-CM

## 2013-10-25 DIAGNOSIS — E876 Hypokalemia: Secondary | ICD-10-CM

## 2013-10-25 DIAGNOSIS — J449 Chronic obstructive pulmonary disease, unspecified: Secondary | ICD-10-CM

## 2013-10-25 DIAGNOSIS — R0902 Hypoxemia: Secondary | ICD-10-CM

## 2013-10-25 DIAGNOSIS — I868 Varicose veins of other specified sites: Secondary | ICD-10-CM | POA: Insufficient documentation

## 2013-10-25 DIAGNOSIS — J9611 Chronic respiratory failure with hypoxia: Secondary | ICD-10-CM

## 2013-10-25 DIAGNOSIS — K7460 Unspecified cirrhosis of liver: Secondary | ICD-10-CM

## 2013-10-25 DIAGNOSIS — J9610 Chronic respiratory failure, unspecified whether with hypoxia or hypercapnia: Secondary | ICD-10-CM

## 2013-10-25 DIAGNOSIS — I2510 Atherosclerotic heart disease of native coronary artery without angina pectoris: Secondary | ICD-10-CM

## 2013-10-25 LAB — COMPREHENSIVE METABOLIC PANEL
ALT: 35 U/L (ref 0–35)
AST: 54 U/L — ABNORMAL HIGH (ref 0–37)
Albumin: 3.6 g/dL (ref 3.5–5.2)
Alkaline Phosphatase: 197 U/L — ABNORMAL HIGH (ref 39–117)
BUN: 14 mg/dL (ref 6–23)
CALCIUM: 9.6 mg/dL (ref 8.4–10.5)
CO2: 29 meq/L (ref 19–32)
CREATININE: 0.8 mg/dL (ref 0.4–1.2)
Chloride: 104 mEq/L (ref 96–112)
GFR: 77.69 mL/min (ref >60.00)
GLUCOSE: 111 mg/dL — AB (ref 70–99)
Potassium: 4.5 mEq/L (ref 3.5–5.1)
Sodium: 137 mEq/L (ref 135–145)
Total Bilirubin: 1.6 mg/dL — ABNORMAL HIGH (ref 0.2–1.2)
Total Protein: 6.6 g/dL (ref 6.0–8.3)

## 2013-10-25 MED ORDER — POTASSIUM CHLORIDE CRYS ER 20 MEQ PO TBCR: 20.0000 meq | EXTENDED_RELEASE_TABLET | Freq: Two times a day (BID) | ORAL | Status: DC

## 2013-10-25 NOTE — Assessment & Plan Note (Signed)
Well controlled. See below.

## 2013-10-25 NOTE — Progress Notes (Signed)
Subjective:   Patient ID: Brandi Cline, female    DOB: 08/29/1930, 80 y.o.   MRN: [id number]  Brandi Cline is a pleasant 80 y.o. year old female who presents to clinic today with Follow-up  on 10/25/2013  HPI:  Saw her two weeks ago for hospital follow up.  At that time, we d/c'd her losartan since she was started on Inderall due to splenic/gastric varices found on CT during recent admission.  She stopped taking the inderall and restarted her losartan on her own because she felt more fatigue and worsening shortness of breath.  BP Readings from Last 3 Encounters:  10/25/13 124/72  10/03/13 128/68  09/23/13 129/70    Alpha antitrypsin was weekly positive.  Current Outpatient Prescriptions on File Prior to Visit  Medication Sig Dispense Refill  . albuterol (PROVENTIL HFA;VENTOLIN HFA) 108 (90 BASE) MCG/ACT inhaler Inhale 2 puffs into the lungs daily as needed for wheezing or shortness of breath.      [name]. albuterol (PROVENTIL) (2.5 MG/3ML) 0.083% nebulizer solution Take 2.5 mg by nebulization every 4 (four) hours as needed. Shortness of breath      . ALPRAZolam (XANAX) 0.5 MG tablet Take 1 tablet (0.5 mg total) by mouth 3 (three) times daily as needed for anxiety. For anxiety  270 tablet  0  . Apple Cider Vinegar 500 MG TABS Take 1 tablet by mouth daily.      [name]. aspirin 81 MG tablet Take 81 mg by mouth daily.      [name]. BIOTIN 5000 PO Take 2 tablets by mouth daily.      . budesonide-formoterol (SYMBICORT) 160-4.5 MCG/ACT inhaler Inhale 2 puffs into the lungs 2 (two) times daily.      . Cholecalciferol (VITAMIN D-3) 5000 UNITS TABS Take 1 tablet by mouth daily.      . Coenzyme Q10 (CO Q 10 PO) Take 2 tablets by mouth daily.       . Cranberry 250 MG TABS Take 2 tablets by mouth.      . cyanocobalamin (,VITAMIN B-12,) 1000 MCG/ML injection Inject 1,000 mcg into the muscle every 30 (thirty) days.      [name]. estradiol (ESTRACE) 0.5 MG tablet Take 0.25 mg by mouth daily.      . fish oil-omega-3 fatty  acids 1000 MG capsule Take 1 g by mouth 2 (two) times daily.       . furosemide (LASIX) 20 MG tablet Take 20 mg by mouth daily.      . isosorbide mononitrate (IMDUR) 30 MG 24 hr tablet Take 60 mg by mouth daily.      [name]. loratadine (CLARITIN) 10 MG tablet Take 10 mg by mouth daily.      . magnesium oxide (MAG-OX) 400 MG tablet Take 400 mg by mouth daily.       . Multiple Vitamin (MULTIVITAMIN) tablet Take 1 tablet by mouth daily.       . nitroGLYCERIN (NITROSTAT) 0.4 MG SL tablet Place 1 tablet (0.4 mg total) under the tongue every 5 (five) minutes as needed. Chest pain  25 tablet  3  . omeprazole (PRILOSEC) 20 MG capsule Take 20 mg by mouth daily.      . propranolol (INDERAL) 10 MG tablet Take 0.5 tablets (5 mg total) by mouth 3 (three) times daily.  30 tablet  0  . simvastatin (ZOCOR) 20 MG tablet Take 20 mg by mouth 2 (two) times a week. Monday, Wednesday, and Friday      .  vitamin C (ASCORBIC ACID) 500 MG tablet Take 500 mg by mouth daily.      . vitamin E 1000 UNIT capsule Take 1,000 Units by mouth daily.      . Zinc Acetate, Oral, (ZINC ACETATE PO) Take 1 tablet by mouth daily.       No current facility-administered medications on file prior to visit.    Allergies  Allergen Reactions  . Avelox [Moxifloxacin Hcl In Nacl] Shortness Of Breath, Swelling and Rash  . Theophyllines     Slight increased heart rate  . Ciprofloxacin   . Codeine Other (See Comments)    Couldn't breath  . Milk-Related Compounds Other (See Comments)    Hurts stomach.. Whole milk, not all dairy products  . Lisinopril Rash    Past Medical History  Diagnosis Date  . Dyspnea   . Esophageal reflux   . CAD (coronary artery disease)   . Hypertension   . Crohn's disease   . Allergic rhinitis   . COPD (chronic obstructive pulmonary disease)   . History of kidney stones   . History of shingles   . Trigeminal neuralgia   . Family history of anesthesia complication     " SISTER HAD BAD FEELINGS "  . Anginal  pain   . Anxiety     ' JUST FOR MY BREATHING "  . Arthritis     mild in hands  . Hyperlipidemia     Past Surgical History  Procedure Laterality Date  . Coronary stents    . Lithotripsy    . Ureteral stent placement    . Cholecystectomy    . Total abdominal hysterectomy    . Partial resections large and small bowel for crohn's    . Breast lumps benign      Family History  Problem Relation Age of Onset  . Breast cancer Sister   . COPD Sister   . Stroke Mother   . Prostate cancer Father   . Prostate cancer Brother   . Heart attack Brother     History   Social History  . Marital Status: Widowed    Spouse Name: N/A    Number of Children: N/A  . Years of Education: N/A   Occupational History  . PT [employer]    Social History Main Topics  . Smoking status: Former Smoker -- 1.00 packs/day for 30 years    Types: Cigarettes    Quit date: 04/12/1990  . Smokeless tobacco: Never Used  . Alcohol Use: No  . Drug Use: No  . Sexual Activity: No   Other Topics Concern  . Not on file   Social History Narrative   Has a living will and HPOA- Brandi Cline.   Would desire CPR.   Would not want prolonged life support, would not want feeding tubes.         The PMH, PSH, Social History, Family History, Medications, and allergies have been reviewed in [facility], and have been updated if relevant.    Review of Systems    See HPI +SOB No CP  Objective:    BP 124/72  Pulse 85  Temp(Src) 97.4 F (36.3 C) (Oral)  Wt 145 lb (65.772 kg)  SpO2 95%   Physical Exam   General: Frail, pleasant female in NAD  Lungs: Clear to auscultation bilaterally without wheezes or crackles  Cardiovascular: Regular rate and rhythm without murmur gallop or rub normal S1 and S2  Abdomen: Nontender, nondistended, soft, bowel sounds  positive, no rebound, no ascites, no appreciable mass  Extremities: No significant cyanosis, clubbing, or edema bilateral lower extremities  Psych: good eye  contact, not anxious or depressed appearing      Assessment & Plan:   HYPERTENSION  Hypokalemia - Plan: Comprehensive metabolic panel  CAD  Chronic respiratory failure with hypoxia  COPD  Splenic varices No Follow-up on file.

## 2013-10-25 NOTE — Patient Instructions (Signed)
Great to see you. I will send a message to Dr. [name]. Call me after your appointment with him. We will call you with your lab results.

## 2013-10-25 NOTE — Assessment & Plan Note (Signed)
With ? Alpha antitrypsin.  Keep appt with Dr. [name]. Continue O2- I did advise shorter [place] cord as she was wrapping the long cord around her portable tank in office- may not be as effective. The patient indicates understanding of these issues and agrees with the plan.

## 2013-10-25 NOTE — Assessment & Plan Note (Signed)
With varices. This is a difficult issue because inderall and betablockers in general could worsen her breathing and lack of energy.  Will route this note to Dr. [name] for his opinion.  After a long discuss with pt today, she would prefer to not restart Inderall and to continue losartan. BP well controlled.

## 2013-10-25 NOTE — Progress Notes (Signed)
Pre visit review using our clinic review tool, if applicable. No additional management support is needed unless otherwise documented below in the visit note. 

## 2013-10-25 NOTE — Assessment & Plan Note (Signed)
Recheck labs 

## 2013-10-30 ENCOUNTER — Encounter: Payer: Self-pay | Admitting: Internal Medicine

## 2013-10-30 ENCOUNTER — Other Ambulatory Visit: Payer: Medicare HMO

## 2013-10-30 ENCOUNTER — Ambulatory Visit (INDEPENDENT_AMBULATORY_CARE_PROVIDER_SITE_OTHER): Payer: Medicare HMO | Admitting: Internal Medicine

## 2013-10-30 VITALS — BP 114/70 | HR 80 | Ht 64.0 in | Wt 146.8 lb

## 2013-10-30 DIAGNOSIS — J9611 Chronic respiratory failure with hypoxia: Secondary | ICD-10-CM

## 2013-10-30 DIAGNOSIS — J438 Other emphysema: Secondary | ICD-10-CM

## 2013-10-30 DIAGNOSIS — J439 Emphysema, unspecified: Secondary | ICD-10-CM

## 2013-10-30 DIAGNOSIS — R0902 Hypoxemia: Secondary | ICD-10-CM

## 2013-10-30 DIAGNOSIS — J9610 Chronic respiratory failure, unspecified whether with hypoxia or hypercapnia: Secondary | ICD-10-CM

## 2013-10-30 MED ORDER — UMECLIDINIUM-VILANTEROL 62.5-25 MCG/INH IN AEPB: 1.0000 | INHALATION_SPRAY | Freq: Every day | RESPIRATORY_TRACT | Status: DC

## 2013-10-30 NOTE — Progress Notes (Signed)
Subjective:    Patient ID: Brandi Cline, female    DOB: 10-09-1930, 80 y.o.   MRN: [id number] Acute visit- Dr Shelle Iron 09/28/11- The patient comes in today for an acute sick visit.  She has known significant COPD, and is usually followed by Dr. [name].  She gives a 2 to three-day history of increasing shortness of breath, and this culminated in severe shortness of breath this morning.  She is currently a little better after using her morning bronchodilators and her neb treatment.  She has no significant cough, mucus, or congestion.  She does feel a chest heaviness, especially on the left side.  She has a history of coronary disease with stents, but thinks that chest discomfort is different from her usual angina.  Surprisingly, her oxygen saturations today are excellent.  12/06/11 - 80 yoF followed for COPD, complicated by allergic rhinitis, GERD, Crohns Disease, CAD/ stents Patient states had pneumonia/ hosp/ heart cath( ok w/ no new stents) in June. States breathing is a little worse since last office visit. States wears oxygen on 2L as needed. c/o sob, wheezing, and chest tightness every now and then. Denies chest pain and cough. Occasional scant clear mucus. Denies chest pain. Notices dyspnea mainly with exertion, worse in humid weather. CXR 10/08/11-reviewed with her IMPRESSION:  COPD/chronic changes. No active disease.  Original Report Authenticated By: [name], M.D.   03/17/12- 80 yoF followed for COPD, complicated by allergic rhinitis, GERD, Crohns Disease, CAD/ stents FOLLOWS FOR: feels like symibcort and Spiriva not helping anymore-would like something different and cheaper Sister here 2 URI's since August treated by PCP. Needed prednisone.  Feels Symbicort wear off before 12 hour next dose. Wearing O2 more and using neb twice daily.  Little cough- scant clear mucus. Aware of angina despite isorbide. Feet swell- varies, not new or progressive. CXR 10/07/11- COPD  04/27/12-  81 yoF  followed for COPD, complicated by allergic rhinitis, GERD, Crohns Disease, CAD/ stents FOLLOWS FOR: Increased SOB today-unsure of cause(used nebulizer before OV-not much  difference), did not hear any wheezing. Denies any cough or chest congestion Often feels tight at night. Aware of increased fluid retention occasionally. Denies chest pain, infection, wheeze or cough. Last visit we gave sample Breo ellipta, which she blames for fluid retention. Continues oxygen 2 L/Advanced for sleep and as needed  12/19/12- 81 yoF former smoker followed for COPD, complicated by allergic rhinitis, GERD, Crohns Disease, CAD/ stents     Family here. ACUTE:  Increased SOB, tightness that travels around to back and some wheezing.  Sore throat in am and  scratchy all day Continues oxygen 3 L/Advanced for sleep and as needed. She has been more dyspneic over past year. We reviewed Chest CT from the spring, noting it was done because of dyspnea then. Hosp 3/26-07/12/12 for AECOPD with NAD on CXR.  Now 3-4 weeks variable hoarseness- on magic mouthwash and prednisone taper. Some difficulty swallowing.  No cough, scant deep yellow sputum, some tight. No pain, blood, fever, palpitation. Easy DOE needing 3LO2 rest, 4-5L exertion in home. CT chest 07/05/12 IMPRESSION:  . Stable hyperinflation and chronic interstitial prominence. Again  noted left basilar scarring. No acute infiltrate or pulmonary  edema.  Original Report Authenticated By: [name], M.D. CXR 07/10/12 IMPRESSION:  No acute cardiopulmonary abnormality seen. Chronic findings as  described above.  Original Report Authenticated By: [name]., M.D.  01/30/13- 28 yoF former smoker followed for COPD, complicated by allergic rhinitis, GERD, Crohns Disease, CAD/ stents  Family here. FOLLOWS FOR: "weak as water"; swelling in feet, staying tired. Good and bad days Weight gain 10 pounds in the past month. Ankles swell. Little cough, scant yellow. Pain mid  thoracic spine. Continues O2 4L/ Advanced. Theophylline was no help and caused palpitations, so quit. Last prednisone 2 weeks ago. ENT/Dr. [name] saw her in September and diagnosed esophageal reflux and thrush. He doubled her acid blocker.  03/20/13- 74 yoF former smoker followed for COPD, complicated by allergic rhinitis, GERD, Crohns Disease, CAD/ stents   Daughter here FOLLOWS FOR: Breathing is unchanged. Reports SOB and coughing. Denies chest tightness or wheezing.   Dr. Kevan Ny is treating with Lasix. She is off of prednisone. Morning cough with clear mucus. Using Flutter device for pulmonary toilet. Medications discussed. Hosp for AECOPD 05/13/12- 05/15/12 CXR 01/30/13 IMPRESSION:  Bibasilar linear opacities, consistent with atelectasis or scarring.  Emphysematous changes.  Electronically Signed  By: [name] M.D.  On: 01/30/2013 16:54  07/03/13- 31 yoF former smoker followed for COPD, complicated by allergic rhinitis, GERD, Crohns Disease, CAD/ stents   Daughter here FOLLOWS FOR: Symbicort works well for patient about 4-5 hours and then uses rescue inhaler; using neb treatments as needed; but unsure if Spiriva is actually helping. Wonders if she can try [name] again as she has had past 2 days without any side effects. Now finishing prednisone and Z-Pak from primary physician. Denies any benefit from [facility] Using either nebulizer or Proair once or twice daily.  09/18/13- 32 yoF former smoker followed for COPD, complicated by allergic rhinitis, GERD, Crohns Disease, CAD/ stents   Daughter here FOLLOWS FOR: Pt states breathing has worsened since last OV. Pt states the theophyillne isnt helping. Pt states she has an increase in SOB. C/o mild productive cough with clear mucus and chest tightness with deep breaths.   10/30/13- 82 yoF former smoker followed for COPD/ E, complicated by allergic rhinitis, GERD, Crohns Disease, CAD/ stents, cirrhosis   Daughter here FOLLOWS FOR: Using up to  5l/m cont at home; having a hard time breathing-heat gets her worse. Recent hospital 3 or 4 weeks ago for urinary sepsis-noted cryptogenic cirrhosis- there was question about possible a1AT abnormality. Cough with scant clear sputum 3 weeks. O2 5L/ Advanced CT chest 09/19/13 IMPRESSION:  No evidence of significant pulmonary embolus. Extensive  emphysematous changes in the lungs.  Electronically Signed  By: Burman Nieves M.D.  On: 09/19/2013 22:10  ROS-see HPI Constitutional:   No-   weight loss, night sweats, fevers, chills, fatigue, lassitude. HEENT:   No-  headaches, +difficulty swallowing, tooth/dental problems, +sore throat,       No-  sneezing, itching, ear ache, nasal congestion, post nasal drip,  CV:  No-   chest pain, orthopnea, PND, +swelling in lower extremities, anasarca, dizziness, palpitations Resp: +  shortness of breath with exertion or at rest.             No-  productive cough, no- non-productive cough,  No- coughing up of blood.              No- change in color of mucus.  No- wheezing.   Skin: No-rash or lesions. GI:  + heartburn, indigestion, no-abdominal pain, nausea, vomiting,  GU:  MS:  No-   joint pain or swelling.  + back pain Neuro-     nothing unusual Psych:  No- change in mood or affect. No depression or anxiety.  No memory loss.  Objective:  OBJ- Physical Exam General- Alert, Oriented, Affect-appropriate,  Distress- none acute, wheelchair, O2 5L Skin- rash-none, lesions- none, excoriation- none Lymphadenopathy- none Head- atraumatic            Eyes- Gross vision intact, PERRLA, conjunctivae and secretions clear            Ears- +Hearing aids            Nose- Clear, no-Septal dev, mucus, polyps, erosion, perforation             Throat- Mallampati II , mucosa clear , drainage- none, tonsils- atrophic. +Hoarse Neck- flexible , trachea midline, no stridor , thyroid nl, carotid no bruit Chest - symmetrical excursion , unlabored           Heart/CV- RRR , no  murmur , no gallop  , no rub, nl s1 s2                           - JVD-+ 1, edema- 1+, stasis changes- none, varices- none           Lung- decreased breath sounds, coarse, wheeze- none, cough- none ,                                             dullness-none, rub-none           Chest wall-  Abd-  Br/ Gen/ Rectal- Not done, not indicated Extrem- cyanosis- none, clubbing, none, atrophy- none, strength- nl.  Neuro- grossly intact to observation  Assessment & Plan:

## 2013-10-30 NOTE — Patient Instructions (Addendum)
Order- DME [facility]- evaluate for lighter portable  3L    Dx COPD emphysema  Order lab- alpha 1 antitrypsin level  Sample Anoro inhaler   1 puff, one time daily maintenance inhaler      Try this instead of the Symbicort for now. When the sample runs out, go back to [facility] for comparison.  Please call as needed

## 2013-11-02 ENCOUNTER — Telehealth: Payer: Self-pay | Admitting: Internal Medicine

## 2013-11-02 NOTE — Telephone Encounter (Signed)
Called and spoke to pt. Pt stated she has noticed a significant difference while using the Anoro. Pt stated she is not near as SOB throughout the day and felt well enough she did not use her O2 at all yesterday during the day. Pt does not have a pulse/ox to measure sats, advised pt she should invest in one. Pt stated she would like to have more samples to see if she really does notice a difference that would last more than 5 days. Pt checked and the Anoro, it is not on her formulary. Pt is aware that CY is out of the office today and will not return till next week and knows once she runs out of the Anoro to start back on the symbicort to see if she can tell a difference then.   CY please advise.   Allergies  Allergen Reactions  . Avelox [Moxifloxacin Hcl In Nacl] Shortness Of Breath, Swelling and Rash  . Theophyllines     Slight increased heart rate  . Ciprofloxacin   . Codeine Other (See Comments)    Couldn't breath  . Milk-Related Compounds Other (See Comments)    Hurts stomach.. Whole milk, not all dairy products  . Lisinopril Rash     Current Outpatient Prescriptions on File Prior to Visit  Medication Sig Dispense Refill  . albuterol (PROVENTIL HFA;VENTOLIN HFA) 108 (90 BASE) MCG/ACT inhaler Inhale 2 puffs into the lungs daily as needed for wheezing or shortness of breath.      [name] albuterol (PROVENTIL) (2.5 MG/3ML) 0.083% nebulizer solution Take 2.5 mg by nebulization every 4 (four) hours as needed. Shortness of breath      . ALPRAZolam (XANAX) 0.5 MG tablet Take 1 tablet (0.5 mg total) by mouth 3 (three) times daily as needed for anxiety. For anxiety  270 tablet  0  . Apple Cider Vinegar 500 MG TABS Take 1 tablet by mouth daily.      [name] aspirin 81 MG tablet Take 81 mg by mouth daily.      [name] BIOTIN 5000 PO Take 2 tablets by mouth daily.      . budesonide-formoterol (SYMBICORT) 160-4.5 MCG/ACT inhaler Inhale 2 puffs into the lungs 2 (two) times daily.      . Cholecalciferol (VITAMIN D-3)  5000 UNITS TABS Take 1 tablet by mouth daily.      . Coenzyme Q10 (CO Q 10 PO) Take 2 tablets by mouth daily.       . Cranberry 250 MG TABS Take 2 tablets by mouth.      . cyanocobalamin (,VITAMIN B-12,) 1000 MCG/ML injection Inject 1,000 mcg into the muscle every 30 (thirty) days.      [name] estradiol (ESTRACE) 0.5 MG tablet Take 0.25 mg by mouth daily.      . fish oil-omega-3 fatty acids 1000 MG capsule Take 1 g by mouth 2 (two) times daily.       . furosemide (LASIX) 20 MG tablet Take 20 mg by mouth daily.      . isosorbide mononitrate (IMDUR) 30 MG 24 hr tablet Take 60 mg by mouth daily.      [name] loratadine (CLARITIN) 10 MG tablet Take 10 mg by mouth daily.      [name] losartan (COZAAR) 25 MG tablet Take 25 mg by mouth daily.      . magnesium oxide (MAG-OX) 400 MG tablet Take 400 mg by mouth daily.       . Multiple Vitamin (MULTIVITAMIN) tablet Take 1 tablet by mouth  daily.       . nitroGLYCERIN (NITROSTAT) 0.4 MG SL tablet Place 1 tablet (0.4 mg total) under the tongue every 5 (five) minutes as needed. Chest pain  25 tablet  3  . omeprazole (PRILOSEC) 20 MG capsule Take 20 mg by mouth daily.      . potassium chloride SA (K-DUR,KLOR-CON) 20 MEQ tablet Take 1 tablet (20 mEq total) by mouth 2 (two) times daily. Alternate taking 2 one day then 1 the next  60 tablet  3  . simvastatin (ZOCOR) 20 MG tablet Take 20 mg by mouth 2 (two) times a week. Monday, Wednesday, and Friday      . Umeclidinium-Vilanterol (ANORO ELLIPTA) 62.5-25 MCG/INH AEPB Inhale 1 puff into the lungs daily.  1 each  0  . vitamin C (ASCORBIC ACID) 500 MG tablet Take 500 mg by mouth daily.      . vitamin E 1000 UNIT capsule Take 1,000 Units by mouth daily.      . Zinc Acetate, Oral, (ZINC ACETATE PO) Take 1 tablet by mouth daily.       No current facility-administered medications on file prior to visit.

## 2013-11-05 LAB — ALPHA-1 ANTITRYPSIN PHENOTYPE: A-1 Antitrypsin: 178 mg/dL (ref 83–199)

## 2013-11-05 MED ORDER — UMECLIDINIUM-VILANTEROL 62.5-25 MCG/INH IN AEPB: 1.0000 | INHALATION_SPRAY | Freq: Every day | RESPIRATORY_TRACT | Status: DC

## 2013-11-05 NOTE — Telephone Encounter (Signed)
Called and spoke with pt and she is aware of sample of the anoro that has been left up front.  Pt will come by this week to pick this up.

## 2013-11-05 NOTE — Telephone Encounter (Signed)
Ok to have another sample of Anoro 1 puff, once daily

## 2013-11-06 ENCOUNTER — Telehealth: Payer: Self-pay | Admitting: Internal Medicine

## 2013-11-06 NOTE — Telephone Encounter (Signed)
See phone note

## 2013-11-06 NOTE — Telephone Encounter (Signed)
Spoke with patient  Aware of recs per Dr [name] Pt to contact our office and let us know how she tolerates the Anoro on a longer course. Pt to let us know is her SOB persists. Pt advise to contact our office to let us know if she can afford the Anoro--advised her that if cost was an issue, we could change her inhaler to something different.  Nothing further needed.

## 2013-11-06 NOTE — Telephone Encounter (Signed)
Pt calling wanting to know more detail of her blood work results from last week. Pt had Alpha-1 Antitrypsin drawn 10/30/13 Results were NEG, pt does not carry the gene for Emphysema  The patient is wanting to know what this means now for her? Does this mean that she does not have nor will ever get Emphysema? What is he reason for her current symptoms? What next?  Please advise Dr Maple HudsonYoung. Thanks.

## 2013-11-06 NOTE — Telephone Encounter (Signed)
I explained a1AT to her. She is genetically normal, so family is not at unusual risk of emphysema. She likes Anoro but isn't sure she can afford it and is trying one more sample. If not she may want to try Spiriva alone, or a LABA/ICS alone for cost effectiveness.

## 2013-11-08 ENCOUNTER — Telehealth: Payer: Self-pay | Admitting: Internal Medicine

## 2013-11-08 MED ORDER — UMECLIDINIUM-VILANTEROL 62.5-25 MCG/INH IN AEPB: 1.0000 | INHALATION_SPRAY | Freq: Every day | RESPIRATORY_TRACT | Status: DC

## 2013-11-08 NOTE — Telephone Encounter (Signed)
Pt aware RX sent in. Nothing further needed 

## 2013-11-08 NOTE — Telephone Encounter (Signed)
Order- DME [facility]- evaluate for lighter portable  3L    Dx COPD emphysema Order lab- alpha 1 antitrypsin level Sample Anoro inhaler   1 puff, one time daily maintenance inhaler      Try this instead of the Symbicort for now. When the sample runs out, go back to [facility] for comparison. Please call as needed --   Called spoke with pt. She reports the anoro works much better than the symbicort. She wants an RX called in for her. Please advise thanks

## 2013-11-08 NOTE — Telephone Encounter (Signed)
Ok Rx Anoro inhaler    # 1, refill prn    1 puff, once daily  We can d/c Symbicort from her list

## 2013-11-12 ENCOUNTER — Ambulatory Visit: Payer: Medicare HMO | Admitting: Family Medicine

## 2013-11-15 ENCOUNTER — Telehealth: Payer: Self-pay

## 2013-11-15 ENCOUNTER — Telehealth: Payer: Self-pay | Admitting: Internal Medicine

## 2013-11-15 NOTE — Telephone Encounter (Signed)
LMTCB

## 2013-11-15 NOTE — Telephone Encounter (Signed)
Don't fully understand message. I have not seen patient in 3 months??  Unremarkable shoulder films.   If she is having problems, I am happy to see her.  RIGHT SHOULDER - 2+ VIEW  COMPARISON: None.  FINDINGS:  There is no evidence of fracture or dislocation. Visualized ribs  appear normal. There is no evidence of arthropathy or other focal  bone abnormality. Soft tissues are unremarkable.  IMPRESSION:  Normal right shoulder.  Electronically Signed  By: Roque LiasJames Green M.D.  On: 09/06/2013 14:52

## 2013-11-15 NOTE — Telephone Encounter (Signed)
Pt left v/m requesting results from recent shoulder xrays.Please advise.

## 2013-11-15 NOTE — Telephone Encounter (Signed)
Brandi Cline notified shoulder x-ray she had done on 09/06/2013 showed normal right shoulder.

## 2013-11-16 NOTE — Telephone Encounter (Signed)
LMOM TCB x[facility] ???

## 2013-11-16 NOTE — Telephone Encounter (Signed)
Spoke with pt. She reports anoro is not on her formulary yet. Pt was d/c'd from symbicort and changed to anoro. Pt does not know what is on formulary. Please advise CDY thanks

## 2013-11-18 NOTE — Assessment & Plan Note (Signed)
Gradually progressive end-stage COPD, at least GOLD III Plan-stop theophylline as unhelpful, try a long acting neb solution like Perforomist

## 2013-11-18 NOTE — Assessment & Plan Note (Signed)
Far advanced emphysema

## 2013-11-19 MED ORDER — BUDESONIDE-FORMOTEROL FUMARATE 160-4.5 MCG/ACT IN AERO: 2.0000 | INHALATION_SPRAY | Freq: Two times a day (BID) | RESPIRATORY_TRACT | Status: DC

## 2013-11-19 NOTE — Telephone Encounter (Signed)
Called spoke w/ pt. Aware of recs. RX sent in. Nothing further needed 

## 2013-11-19 NOTE — Telephone Encounter (Signed)
Symbicort is also a good medicine. Since it is covered by her insurance recommend she use up whatever Anoro she still has, then go back to Symbicort 160, 2 puffs then rinse mouth, twice daily, refill prn.

## 2013-11-29 ENCOUNTER — Ambulatory Visit: Payer: Medicare HMO | Admitting: Family Medicine

## 2013-12-21 ENCOUNTER — Telehealth: Payer: Self-pay | Admitting: Internal Medicine

## 2013-12-21 DIAGNOSIS — J438 Other emphysema: Secondary | ICD-10-CM

## 2013-12-21 NOTE — Telephone Encounter (Signed)
ATC fats busy signal wcb

## 2013-12-24 NOTE — Telephone Encounter (Signed)
Called and spoke with pt and she stated that she has been trying to get the lighter weight POC from [facility].  She stated that the RT from [facility] came out and she stated that CY will have to order the lighter POC.  CY please advise. Thanks  Last ov--10/30/2013 Next ov--01/01/2014  Allergies  Allergen Reactions  . Avelox [Moxifloxacin Hcl In Nacl] Shortness Of Breath, Swelling and Rash  . Theophyllines     Slight increased heart rate  . Ciprofloxacin   . Codeine Other (See Comments)    Couldn't breath  . Milk-Related Compounds Other (See Comments)    Hurts stomach.. Whole milk, not all dairy products  . Lisinopril Rash    Current Outpatient Prescriptions on File Prior to Visit  Medication Sig Dispense Refill  . albuterol (PROVENTIL HFA;VENTOLIN HFA) 108 (90 BASE) MCG/ACT inhaler Inhale 2 puffs into the lungs daily as needed for wheezing or shortness of breath.      [name] albuterol (PROVENTIL) (2.5 MG/3ML) 0.083% nebulizer solution Take 2.5 mg by nebulization every 4 (four) hours as needed. Shortness of breath      . ALPRAZolam (XANAX) 0.5 MG tablet Take 1 tablet (0.5 mg total) by mouth 3 (three) times daily as needed for anxiety. For anxiety  270 tablet  0  . Apple Cider Vinegar 500 MG TABS Take 1 tablet by mouth daily.      [name] aspirin 81 MG tablet Take 81 mg by mouth daily.      [name] BIOTIN 5000 PO Take 2 tablets by mouth daily.      . budesonide-formoterol (SYMBICORT) 160-4.5 MCG/ACT inhaler Inhale 2 puffs into the lungs 2 (two) times daily.  1 Inhaler  prn  . Cholecalciferol (VITAMIN D-3) 5000 UNITS TABS Take 1 tablet by mouth daily.      . Coenzyme Q10 (CO Q 10 PO) Take 2 tablets by mouth daily.       . Cranberry 250 MG TABS Take 2 tablets by mouth.      . cyanocobalamin (,VITAMIN B-12,) 1000 MCG/ML injection Inject 1,000 mcg into the muscle every 30 (thirty) days.      [name] estradiol (ESTRACE) 0.5 MG tablet Take 0.25 mg by mouth daily.      . fish oil-omega-3 fatty acids 1000 MG capsule Take 1 g by  mouth 2 (two) times daily.       . furosemide (LASIX) 20 MG tablet Take 20 mg by mouth daily.      . isosorbide mononitrate (IMDUR) 30 MG 24 hr tablet Take 60 mg by mouth daily.      [name] loratadine (CLARITIN) 10 MG tablet Take 10 mg by mouth daily.      [name] losartan (COZAAR) 25 MG tablet Take 25 mg by mouth daily.      . magnesium oxide (MAG-OX) 400 MG tablet Take 400 mg by mouth daily.       . Multiple Vitamin (MULTIVITAMIN) tablet Take 1 tablet by mouth daily.       . nitroGLYCERIN (NITROSTAT) 0.4 MG SL tablet Place 1 tablet (0.4 mg total) under the tongue every 5 (five) minutes as needed. Chest pain  25 tablet  3  . omeprazole (PRILOSEC) 20 MG capsule Take 20 mg by mouth daily.      . potassium chloride SA (K-DUR,KLOR-CON) 20 MEQ tablet Take 1 tablet (20 mEq total) by mouth 2 (two) times daily. Alternate taking 2 one day then 1 the next  60 tablet  3  . simvastatin (  ZOCOR) 20 MG tablet Take 20 mg by mouth 2 (two) times a week. Monday, Wednesday, and Friday      . Umeclidinium-Vilanterol (ANORO ELLIPTA) 62.5-25 MCG/INH AEPB Inhale 1 puff into the lungs daily.  60 each  prn  . vitamin C (ASCORBIC ACID) 500 MG tablet Take 500 mg by mouth daily.      . vitamin E 1000 UNIT capsule Take 1,000 Units by mouth daily.      . Zinc Acetate, Oral, (ZINC ACETATE PO) Take 1 tablet by mouth daily.       No current facility-administered medications on file prior to visit.

## 2013-12-27 NOTE — Telephone Encounter (Signed)
Spoke with the pt and notified of recs per CDY  She verbalized understanding  Order was sent to [facility] 

## 2013-12-27 NOTE — Telephone Encounter (Signed)
Yes- we need to order DME "ealuate for light portable O2" at the flow rate she is using now, for dx COPD

## 2013-12-27 NOTE — Telephone Encounter (Signed)
CY, Please advise if you are okay with order for lighter weight POC for patient. Thanks.

## 2014-01-01 ENCOUNTER — Encounter: Payer: Self-pay | Admitting: Internal Medicine

## 2014-01-01 ENCOUNTER — Ambulatory Visit (INDEPENDENT_AMBULATORY_CARE_PROVIDER_SITE_OTHER): Payer: Medicare HMO | Admitting: Internal Medicine

## 2014-01-01 VITALS — BP 112/64 | HR 84 | Ht 64.0 in | Wt 147.4 lb

## 2014-01-01 DIAGNOSIS — Z23 Encounter for immunization: Secondary | ICD-10-CM

## 2014-01-01 NOTE — Progress Notes (Signed)
Subjective:    Patient ID: Brandi Cline, female    DOB: 10-09-1930, 80 y.o.   MRN: [id number] Acute visit- Dr Shelle Iron 09/28/11- The patient comes in today for an acute sick visit.  She has known significant COPD, and is usually followed by Dr. [name].  She gives a 2 to three-day history of increasing shortness of breath, and this culminated in severe shortness of breath this morning.  She is currently a little better after using her morning bronchodilators and her neb treatment.  She has no significant cough, mucus, or congestion.  She does feel a chest heaviness, especially on the left side.  She has a history of coronary disease with stents, but thinks that chest discomfort is different from her usual angina.  Surprisingly, her oxygen saturations today are excellent.  12/06/11 - 80 yoF followed for COPD, complicated by allergic rhinitis, GERD, Crohns Disease, CAD/ stents Patient states had pneumonia/ hosp/ heart cath( ok w/ no new stents) in June. States breathing is a little worse since last office visit. States wears oxygen on 2L as needed. c/o sob, wheezing, and chest tightness every now and then. Denies chest pain and cough. Occasional scant clear mucus. Denies chest pain. Notices dyspnea mainly with exertion, worse in humid weather. CXR 10/08/11-reviewed with her IMPRESSION:  COPD/chronic changes. No active disease.  Original Report Authenticated By: [name], M.D.   03/17/12- 80 yoF followed for COPD, complicated by allergic rhinitis, GERD, Crohns Disease, CAD/ stents FOLLOWS FOR: feels like symibcort and Spiriva not helping anymore-would like something different and cheaper Sister here 2 URI's since August treated by PCP. Needed prednisone.  Feels Symbicort wear off before 12 hour next dose. Wearing O2 more and using neb twice daily.  Little cough- scant clear mucus. Aware of angina despite isorbide. Feet swell- varies, not new or progressive. CXR 10/07/11- COPD  04/27/12-  81 yoF  followed for COPD, complicated by allergic rhinitis, GERD, Crohns Disease, CAD/ stents FOLLOWS FOR: Increased SOB today-unsure of cause(used nebulizer before OV-not much  difference), did not hear any wheezing. Denies any cough or chest congestion Often feels tight at night. Aware of increased fluid retention occasionally. Denies chest pain, infection, wheeze or cough. Last visit we gave sample Breo ellipta, which she blames for fluid retention. Continues oxygen 2 L/Advanced for sleep and as needed  12/19/12- 81 yoF former smoker followed for COPD, complicated by allergic rhinitis, GERD, Crohns Disease, CAD/ stents     Family here. ACUTE:  Increased SOB, tightness that travels around to back and some wheezing.  Sore throat in am and  scratchy all day Continues oxygen 3 L/Advanced for sleep and as needed. She has been more dyspneic over past year. We reviewed Chest CT from the spring, noting it was done because of dyspnea then. Hosp 3/26-07/12/12 for AECOPD with NAD on CXR.  Now 3-4 weeks variable hoarseness- on magic mouthwash and prednisone taper. Some difficulty swallowing.  No cough, scant deep yellow sputum, some tight. No pain, blood, fever, palpitation. Easy DOE needing 3LO2 rest, 4-5L exertion in home. CT chest 07/05/12 IMPRESSION:  . Stable hyperinflation and chronic interstitial prominence. Again  noted left basilar scarring. No acute infiltrate or pulmonary  edema.  Original Report Authenticated By: [name], M.D. CXR 07/10/12 IMPRESSION:  No acute cardiopulmonary abnormality seen. Chronic findings as  described above.  Original Report Authenticated By: [name]., M.D.  01/30/13- 28 yoF former smoker followed for COPD, complicated by allergic rhinitis, GERD, Crohns Disease, CAD/ stents  Family here. FOLLOWS FOR: "weak as water"; swelling in feet, staying tired. Good and bad days Weight gain 10 pounds in the past month. Ankles swell. Little cough, scant yellow. Pain mid  thoracic spine. Continues O2 4L/ Advanced. Theophylline was no help and caused palpitations, so quit. Last prednisone 2 weeks ago. ENT/Dr. [name] saw her in September and diagnosed esophageal reflux and thrush. He doubled her acid blocker.  03/20/13- 66 yoF former smoker followed for COPD, complicated by allergic rhinitis, GERD, Crohns Disease, CAD/ stents   Daughter here FOLLOWS FOR: Breathing is unchanged. Reports SOB and coughing. Denies chest tightness or wheezing.   Dr. Kevan Ny is treating with Lasix. She is off of prednisone. Morning cough with clear mucus. Using Flutter device for pulmonary toilet. Medications discussed. Hosp for AECOPD 05/13/12- 05/15/12 CXR 01/30/13 IMPRESSION:  Bibasilar linear opacities, consistent with atelectasis or scarring.  Emphysematous changes.  Electronically Signed  By: [name] M.D.  On: 01/30/2013 16:54  07/03/13- 89 yoF former smoker followed for COPD, complicated by allergic rhinitis, GERD, Crohns Disease, CAD/ stents   Daughter here FOLLOWS FOR: Symbicort works well for patient about 4-5 hours and then uses rescue inhaler; using neb treatments as needed; but unsure if Spiriva is actually helping. Wonders if she can try [name] again as she has had past 2 days without any side effects. Now finishing prednisone and Z-Pak from primary physician. Denies any benefit from [facility]. Using either nebulizer or Proair once or twice daily.  09/18/13- 6 yoF former smoker followed for COPD, complicated by allergic rhinitis, GERD, Crohns Disease, CAD/ stents   Daughter here FOLLOWS FOR: Pt states breathing has worsened since last OV. Pt states the theophyillne isnt helping. Pt states she has an increase in SOB. C/o mild productive cough with clear mucus and chest tightness with deep breaths.   10/30/13- 82 yoF former smoker followed for COPD/ E, complicated by allergic rhinitis, GERD, Crohns Disease, CAD/ stents   Daughter here FOLLOWS FOR: Using up to 5l/m cont  at home; having a hard time breathing-heat gets her worse.  CT chest 09/19/13 IMPRESSION:  No evidence of significant pulmonary embolus. Extensive  emphysematous changes in the lungs.  Electronically Signed  By: Burman Nieves M.D.  On: 09/19/2013 22:10  01/01/14- 82 yoF former smoker followed for COPD/ E, complicated by allergic rhinitis, GERD, Crohns Disease, CAD/ stents   Daughter here FOLLOWS FOR: continues ot use 5L/M cont at home and 3L/m pulse when out; DME is [facility]. Could tell that Anoro helped better than Symbicort. Anoro is not on her Formulary list through insurance.     ROS-see HPI Constitutional:   No-   weight loss, night sweats, fevers, chills, fatigue, lassitude. HEENT:   No-  headaches, +difficulty swallowing, tooth/dental problems, +sore throat,       No-  sneezing, itching, ear ache, nasal congestion, post nasal drip,  CV:  No-   chest pain, orthopnea, PND, +swelling in lower extremities, anasarca, dizziness, palpitations Resp: +  shortness of breath with exertion or at rest.             No-  productive cough, no- non-productive cough,  No- coughing up of blood.              No- change in color of mucus.  No- wheezing.   Skin: No-rash or lesions. GI:  + heartburn, indigestion, no-abdominal pain, nausea, vomiting,  GU:  MS:  No-   joint pain or swelling.  + back pain Neuro-  nothing unusual Psych:  No- change in mood or affect. No depression or anxiety.  No memory loss.  Objective:  OBJ- Physical Exam General- Alert, Oriented, Affect-appropriate, Distress- none acute, wheelchair, O2 3L Skin- rash-none, lesions- none, excoriation- none Lymphadenopathy- none Head- atraumatic            Eyes- Gross vision intact, PERRLA, conjunctivae and secretions clear            Ears- +Hearing aids            Nose- Clear, no-Septal dev, mucus, polyps, erosion, perforation             Throat- Mallampati II , mucosa clear , drainage- none, tonsils- atrophic. +Hoarse Neck-  flexible , trachea midline, no stridor , thyroid nl, carotid no bruit Chest - symmetrical excursion , unlabored           Heart/CV- RRR , no murmur , no gallop  , no rub, nl s1 s2                           - JVD-+ 1, edema- 1+, stasis changes- none, varices- none           Lung- decreased breath sounds, clear, wheeze- none, cough- none ,                                             dullness-none, rub-none           Chest wall-  Abd-  Br/ Gen/ Rectal- Not done, not indicated Extrem- cyanosis- none, clubbing, none, atrophy- none, strength- nl. Neg Homan's Neuro- grossly intact to observation  Assessment & Plan:

## 2014-01-01 NOTE — Patient Instructions (Signed)
Flu vax  We can continue O2/ Advanced as before. I agree with having them evaluate you for a lighter weight portable system.  Instead of Symbicort and Spiriva, or Anoro- try sample:  Stiolto Respimat   2 puff, once daily   Alone  OR  With Symbicort, try using Incruse Ellipta 1 puff, once daily

## 2014-01-07 ENCOUNTER — Ambulatory Visit (INDEPENDENT_AMBULATORY_CARE_PROVIDER_SITE_OTHER): Payer: Medicare HMO | Admitting: Internal Medicine

## 2014-01-07 ENCOUNTER — Encounter: Payer: Self-pay | Admitting: Internal Medicine

## 2014-01-07 VITALS — BP 118/54 | HR 89 | Temp 97.5°F | Wt 142.0 lb

## 2014-01-07 DIAGNOSIS — N390 Urinary tract infection, site not specified: Secondary | ICD-10-CM

## 2014-01-07 DIAGNOSIS — R300 Dysuria: Secondary | ICD-10-CM

## 2014-01-07 LAB — POCT URINALYSIS DIPSTICK
Glucose, UA: NEGATIVE
Ketones, UA: NEGATIVE
Spec Grav, UA: 1.015
Urobilinogen, UA: 1
pH, UA: 6

## 2014-01-07 MED ORDER — SULFAMETHOXAZOLE-TMP DS 800-160 MG PO TABS: 1.0000 | ORAL_TABLET | Freq: Two times a day (BID) | ORAL | Status: DC

## 2014-01-07 NOTE — Progress Notes (Signed)
Pre visit review using our clinic review tool, if applicable. No additional management support is needed unless otherwise documented below in the visit note. 

## 2014-01-07 NOTE — Progress Notes (Signed)
Subjective:    Patient ID: Brandi Cline, female    DOB: 11-16-1930, 80 y.o.   MRN: [id number]  HPI  HPI  Pt presents to the clinic today with c/o urinary symptoms. Lower back pain for the past week, some dysuria this morning, took AZO this morning without relief.    Review of Systems  Past Medical History  Diagnosis Date  . Dyspnea   . Esophageal reflux   . CAD (coronary artery disease)   . Hypertension   . Crohn's disease   . Allergic rhinitis   . COPD (chronic obstructive pulmonary disease)   . History of kidney stones   . History of shingles   . Trigeminal neuralgia   . Family history of anesthesia complication     " SISTER HAD BAD FEELINGS "  . Anginal pain   . Anxiety     ' JUST FOR MY BREATHING "  . Arthritis     mild in hands  . Hyperlipidemia     Family History  Problem Relation Age of Onset  . Breast cancer Sister   . COPD Sister   . Stroke Mother   . Prostate cancer Father   . Prostate cancer Brother   . Heart attack Brother     History   Social History  . Marital Status: Widowed    Spouse Name: N/A    Number of Children: N/A  . Years of Education: N/A   Occupational History  . PT [employer]    Social History Main Topics  . Smoking status: Former Smoker -- 1.00 packs/day for 30 years    Types: Cigarettes    Quit date: 04/12/1990  . Smokeless tobacco: Never Used  . Alcohol Use: No  . Drug Use: No  . Sexual Activity: No   Other Topics Concern  . Not on file   Social History Narrative   Has a living will and HPOA- Jhade Berko.   Would desire CPR.   Would not want prolonged life support, would not want feeding tubes.          Allergies  Allergen Reactions  . Avelox [Moxifloxacin Hcl In Nacl] Shortness Of Breath, Swelling and Rash  . Theophyllines     Slight increased heart rate  . Ciprofloxacin   . Codeine Other (See Comments)    Couldn't breath  . Milk-Related Compounds Other (See Comments)    Hurts stomach.. Whole  milk, not all dairy products  . Lisinopril Rash    Constitutional: Denies fever, malaise, fatigue, headache or abrupt weight changes.   GU: Pt reports urgency, frequency and pain with urination. Denies burning sensation, blood in urine, odor or discharge. Skin: Denies redness, rashes, lesions or ulcercations.   No other specific complaints in a complete review of systems (except as listed in HPI above).    Objective:   Physical Exam  There were no vitals taken for this visit. Wt Readings from Last 3 Encounters:  01/01/14 147 lb 6.4 oz (66.86 kg)  10/30/13 146 lb 12.8 oz (66.588 kg)  10/25/13 145 lb (65.772 kg)    General: Appears her stated age, well developed, well nourished in NAD. Cardiovascular: Normal rate and rhythm. S1,S2 noted.  No murmur, rubs or gallops noted. No JVD or BLE edema. No carotid bruits noted. Pulmonary/Chest: Normal effort and positive vesicular breath sounds. No respiratory distress. No wheezes, rales or ronchi noted. Patient is on 3 L of O2 Abdomen: Soft and nontender. Normal bowel sounds,  no bruits noted. No distention or masses noted. Liver, spleen and kidneys non palpable. Tender to palpation over the bladder area. No CVA tenderness.      Assessment & Plan:   Urgency, Frequency, Dysuria secondary to UTI  Urinalysis: 3+ leuk, positive for blood Urine sent for culture eRx sent if for Septra for 5 days, BID Drink plenty of fluids  RTC as needed or if symptoms persist.   Review of Systems     Objective:   Physical Exam        Assessment & Plan:

## 2014-01-07 NOTE — Progress Notes (Signed)
HPI  Pt presents to the clinic today with c/o dysuria and back pain. She reports that the back pain started 1 week ago, but the dysuria started this am. She denies fever, chills, or nausea. She has tried AZO with minimal relief. She has had a UTI in the past and reports this feels the same. Last one 09/2013 treated with Macrobid.   Review of Systems  Past Medical History  Diagnosis Date  . Dyspnea   . Esophageal reflux   . CAD (coronary artery disease)   . Hypertension   . Crohn's disease   . Allergic rhinitis   . COPD (chronic obstructive pulmonary disease)   . History of kidney stones   . History of shingles   . Trigeminal neuralgia   . Family history of anesthesia complication     " SISTER HAD BAD FEELINGS "  . Anginal pain   . Anxiety     ' JUST FOR MY BREATHING "  . Arthritis     mild in hands  . Hyperlipidemia     Family History  Problem Relation Age of Onset  . Breast cancer Sister   . COPD Sister   . Stroke Mother   . Prostate cancer Father   . Prostate cancer Brother   . Heart attack Brother     History   Social History  . Marital Status: Widowed    Spouse Name: N/A    Number of Children: N/A  . Years of Education: N/A   Occupational History  . PT [employer]    Social History Main Topics  . Smoking status: Former Smoker -- 1.00 packs/day for 30 years    Types: Cigarettes    Quit date: 04/12/1990  . Smokeless tobacco: Never Used  . Alcohol Use: No  . Drug Use: No  . Sexual Activity: No   Other Topics Concern  . Not on file   Social History Narrative   Has a living will and HPOA- Shameria Truby.   Would desire CPR.   Would not want prolonged life support, would not want feeding tubes.          Allergies  Allergen Reactions  . Avelox [Moxifloxacin Hcl In Nacl] Shortness Of Breath, Swelling and Rash  . Theophyllines     Slight increased heart rate  . Ciprofloxacin   . Codeine Other (See Comments)    Couldn't breath  . Milk-Related  Compounds Other (See Comments)    Hurts stomach.. Whole milk, not all dairy products  . Lisinopril Rash    Constitutional: Denies fever, malaise, fatigue, headache or abrupt weight changes.   GU: Pt reports urgency, frequency and pain with urination. Denies burning sensation, blood in urine, odor or discharge. Skin: Denies redness, rashes, lesions or ulcercations.   No other specific complaints in a complete review of systems (except as listed in HPI above).    Objective:   Physical Exam  BP 118/54  Pulse 89  Temp(Src) 97.5 F (36.4 C) (Oral)  Wt 142 lb (64.411 kg)  SpO2 97% Wt Readings from Last 3 Encounters:  01/07/14 142 lb (64.411 kg)  01/01/14 147 lb 6.4 oz (66.86 kg)  10/30/13 146 lb 12.8 oz (66.588 kg)    General: Appears her stated age, well developed, well nourished in NAD. Cardiovascular: Normal rate and rhythm. S1,S2 noted.  No murmur, rubs or gallops noted. No JVD or BLE edema. No carotid bruits noted. Pulmonary/Chest: Normal effort and positive vesicular breath sounds. No respiratory  distress. No wheezes, rales or ronchi noted.  Abdomen: Soft. Normal bowel sounds, no bruits noted. No distention or masses noted. Liver, spleen and kidneys non palpable. Tender to palpation over the bladder area. No CVA tenderness.      Assessment & Plan:   Urgency, Frequency, Dysuria secondary to UTI:  Urinalysis: 3+ leuks, pos blood Will send urine culture eRx sent if for Septra BID x 5 days OK to take AZO OTC Drink plenty of fluids  RTC as needed or if symptoms persist.

## 2014-01-07 NOTE — Patient Instructions (Addendum)

## 2014-01-09 ENCOUNTER — Telehealth: Payer: Self-pay | Admitting: Internal Medicine

## 2014-01-09 NOTE — Telephone Encounter (Signed)
Pt was seen Monday 9/28 and is having some problems with the medication prescribed. Please call pt at [phone]

## 2014-01-09 NOTE — Telephone Encounter (Signed)
Not related to the UTI, seems like a separate issue. May need followup

## 2014-01-09 NOTE — Telephone Encounter (Signed)
Pt states has been experiencing leg and back pain and burning in legs started this morning, however has gone away now--denies nausea or vomiting, diarrhea etc--pt states her urinary Sx are better and no dysuria--she had the leg pain before and Sx are still the same--please advise

## 2014-01-10 ENCOUNTER — Other Ambulatory Visit: Payer: Self-pay | Admitting: Family Medicine

## 2014-01-10 ENCOUNTER — Ambulatory Visit (INDEPENDENT_AMBULATORY_CARE_PROVIDER_SITE_OTHER): Payer: Medicare HMO | Admitting: Family Medicine

## 2014-01-10 ENCOUNTER — Encounter: Payer: Self-pay | Admitting: Family Medicine

## 2014-01-10 ENCOUNTER — Ambulatory Visit (INDEPENDENT_AMBULATORY_CARE_PROVIDER_SITE_OTHER)
Admission: RE | Admit: 2014-01-10 | Discharge: 2014-01-10 | Disposition: A | Discharge status: Home / Self Care | Payer: Medicare HMO | Source: Ambulatory Visit | Attending: Family Medicine | Admitting: Family Medicine

## 2014-01-10 VITALS — BP 136/70 | HR 77 | Temp 97.5°F | Wt 147.5 lb

## 2014-01-10 DIAGNOSIS — Z87442 Personal history of urinary calculi: Secondary | ICD-10-CM

## 2014-01-10 DIAGNOSIS — N3943 Post-void dribbling: Secondary | ICD-10-CM

## 2014-01-10 DIAGNOSIS — N390 Urinary tract infection, site not specified: Secondary | ICD-10-CM | POA: Insufficient documentation

## 2014-01-10 LAB — URINE CULTURE: Colony Count: >=100000

## 2014-01-10 MED ORDER — CEPHALEXIN 500 MG PO CAPS: 500.0000 mg | ORAL_CAPSULE | Freq: Two times a day (BID) | ORAL | Status: AC

## 2014-01-10 NOTE — Assessment & Plan Note (Signed)
Persistent symptoms. I am concerned for pyelonephritis although symptoms not classic.  Should be sensitive to bactrim but no improvement. Will start Keflex- also order CT of abd/pelvis to rule out kidney stones/obstructions/hydronephrosis. She is aware to go to ER if symptoms worsen.  The patient indicates understanding of these issues and agrees with the plan.

## 2014-01-10 NOTE — Patient Instructions (Signed)
Hang in there. We are starting Keflex 500 mg twice daily x 7 days. Please stop by to see Shirlee Limerick on your way out to setup your CT scan.

## 2014-01-10 NOTE — Progress Notes (Signed)
Pre visit review using our clinic review tool, if applicable. No additional management support is needed unless otherwise documented below in the visit note. 

## 2014-01-10 NOTE — Progress Notes (Signed)
Subjective:   Patient ID: Brandi Cline, female    DOB: 09/13/1930, 80 y.o.   MRN: [id number]  Brandi Cline is a pleasant 80 y.o. year old female who presents to clinic today with Back Pain and Urinary Incontinence  on 01/10/2014  HPI: Brandi GrosSaw Brandi Cline on 9/28 for dysuria.  Note reviewed.  Dysuria and back pain for over a week. UA pos for 3+ LE, protein, bili, nitrites  Started on Bactrim twice daily x 5 days. Urine cx pos for >100,000, pansensitive.  Still having dysuria even though she has almost finished her course of bactrim. Also having back pain. No fevers, nausea or vomiting. Does have h/o pyelonephritis- was hospitalized in June- did not have a temp at that time either.  Also has a h/o nephrolithiasis, x 3, once required stenting (Brandi Cline). She is not sure if she has seen blood- taking AZO for the dysuria.   Current Outpatient Prescriptions on File Prior to Visit  Medication Sig Dispense Refill  . albuterol (PROVENTIL HFA;VENTOLIN HFA) 108 (90 BASE) MCG/ACT inhaler Inhale 2 puffs into the lungs daily as needed for wheezing or shortness of breath.      [name]. albuterol (PROVENTIL) (2.5 MG/3ML) 0.083% nebulizer solution Take 2.5 mg by nebulization every 4 (four) hours as needed. Shortness of breath      . ALPRAZolam (XANAX) 0.5 MG tablet Take 1 tablet (0.5 mg total) by mouth 3 (three) times daily as needed for anxiety. For anxiety  270 tablet  0  . Apple Cider Vinegar 500 MG TABS Take 1 tablet by mouth daily.      [name]. aspirin 81 MG tablet Take 81 mg by mouth daily.      [name]. BIOTIN 5000 PO Take 2 tablets by mouth daily.      . budesonide-formoterol (SYMBICORT) 160-4.5 MCG/ACT inhaler Inhale 2 puffs into the lungs 2 (two) times daily.  1 Inhaler  prn  . calcium carbonate (OS-CAL) 600 MG TABS tablet Take 600 mg by mouth 2 (two) times daily with a meal.      . Cholecalciferol (VITAMIN D-3) 5000 UNITS TABS Take 1 tablet by mouth daily.      . Coenzyme Q10 (CO Q 10 PO) Take 2 tablets  by mouth daily.       . Cranberry 250 MG TABS Take 2 tablets by mouth.      . cyanocobalamin (,VITAMIN B-12,) 1000 MCG/ML injection Inject 1,000 mcg into the muscle every 30 (thirty) days.      [name]. estradiol (ESTRACE) 0.5 MG tablet Take 0.25 mg by mouth daily.      . fish oil-omega-3 fatty acids 1000 MG capsule Take 1 g by mouth 2 (two) times daily.       . furosemide (LASIX) 20 MG tablet Take 20 mg by mouth daily.      . isosorbide mononitrate (IMDUR) 30 MG 24 hr tablet Take 60 mg by mouth daily.      [name]. loratadine (CLARITIN) 10 MG tablet Take 10 mg by mouth daily.      [name]. losartan (COZAAR) 25 MG tablet Take 25 mg by mouth daily.      . magnesium oxide (MAG-OX) 400 MG tablet Take 400 mg by mouth daily.       . Multiple Vitamin (MULTIVITAMIN) tablet Take 1 tablet by mouth daily.       . nitroGLYCERIN (NITROSTAT) 0.4 MG SL tablet Place 1 tablet (0.4 mg total) under the tongue every 5 (five) minutes as  needed. Chest pain  25 tablet  3  . omeprazole (PRILOSEC) 20 MG capsule Take 20 mg by mouth daily.      . potassium chloride SA (K-DUR,KLOR-CON) 20 MEQ tablet Take 1 tablet (20 mEq total) by mouth 2 (two) times daily. Alternate taking 2 one day then 1 the next  60 tablet  3  . simvastatin (ZOCOR) 20 MG tablet Take 20 mg by mouth 3 (three) times a week. Monday, Wednesday, and Friday      . sulfamethoxazole-trimethoprim (BACTRIM DS) 800-160 MG per tablet Take 1 tablet by mouth 2 (two) times daily.  10 tablet  0  . vitamin C (ASCORBIC ACID) 500 MG tablet Take 500 mg by mouth daily.      . vitamin E 1000 UNIT capsule Take 1,000 Units by mouth daily.      . Zinc Acetate, Oral, (ZINC ACETATE PO) Take 1 tablet by mouth daily.       No current facility-administered medications on file prior to visit.    Allergies  Allergen Reactions  . Avelox [Moxifloxacin Hcl In Nacl] Shortness Of Breath, Swelling and Rash  . Theophyllines     Slight increased heart rate  . Ciprofloxacin   . Codeine Other (See  Comments)    Couldn't breath  . Milk-Related Compounds Other (See Comments)    Hurts stomach.. Whole milk, not all dairy products  . Lisinopril Rash    Past Medical History  Diagnosis Date  . Dyspnea   . Esophageal reflux   . CAD (coronary artery disease)   . Hypertension   . Crohn's disease   . Allergic rhinitis   . COPD (chronic obstructive pulmonary disease)   . History of kidney stones   . History of shingles   . Trigeminal neuralgia   . Family history of anesthesia complication     " SISTER HAD BAD FEELINGS "  . Anginal pain   . Anxiety     ' JUST FOR MY BREATHING "  . Arthritis     mild in hands  . Hyperlipidemia     Past Surgical History  Procedure Laterality Date  . Coronary stents    . Lithotripsy    . Ureteral stent placement    . Cholecystectomy    . Total abdominal hysterectomy    . Partial resections large and small bowel for crohn's    . Breast lumps benign      Family History  Problem Relation Age of Onset  . Breast cancer Sister   . COPD Sister   . Stroke Mother   . Prostate cancer Father   . Prostate cancer Brother   . Heart attack Brother     History   Social History  . Marital Status: Widowed    Spouse Name: N/A    Number of Children: N/A  . Years of Education: N/A   Occupational History  . PT [employer]    Social History Main Topics  . Smoking status: Former Smoker -- 1.00 packs/day for 30 years    Types: Cigarettes    Quit date: 04/12/1990  . Smokeless tobacco: Never Used  . Alcohol Use: No  . Drug Use: No  . Sexual Activity: No   Other Topics Concern  . Not on file   Social History Narrative   Has a living will and HPOA- Brandi Cline.   Would desire CPR.   Would not want prolonged life support, would not want feeding tubes.  The PMH, PSH, Social History, Family History, Medications, and allergies have been reviewed in [facility], and have been updated if relevant.    Review of Systems    See HPI No nausea  or vomiting +fatigue +muscle aches +back pain- bilaterally, radiating to her thigh- had this when she had UTI previously Objective:    BP 136/70  Pulse 77  Temp(Src) 97.5 F (36.4 C) (Oral)  Wt 147 lb 8 oz (66.906 kg)  SpO2 93%   Physical Exam  Nursing note and vitals reviewed. Constitutional: She is oriented to person, place, and time. She appears well-developed and well-nourished. No distress.  Appears fatigued  Abdominal: Soft. There is tenderness.  +left sided suprapubic tenderness NO CVA tenderness   Neurological: She is alert and oriented to person, place, and time.  Skin: Skin is warm and dry.  Psychiatric: She has a normal mood and affect. Her behavior is normal. Judgment and thought content normal.          Assessment & Plan:   Post-void dribbling - Plan: Urine culture, CANCELED: Urinalysis Dipstick  History of nephrolithiasis - Plan: CT Abdomen Pelvis Wo Contrast  Complicated UTI (urinary tract infection) No Follow-up on file.

## 2014-01-10 NOTE — Telephone Encounter (Signed)
Pt called still having lower back pain and upper legs hurt; pt said occasional burning upon urination and dribbling. Pt scheduled appt to see Dr Dayton MartesAron today at 1 PM.

## 2014-01-11 LAB — URINE CULTURE
COLONY COUNT: NO GROWTH
Organism ID, Bacteria: NO GROWTH

## 2014-01-18 ENCOUNTER — Other Ambulatory Visit: Payer: Self-pay | Admitting: Family Medicine

## 2014-02-06 ENCOUNTER — Ambulatory Visit (INDEPENDENT_AMBULATORY_CARE_PROVIDER_SITE_OTHER): Payer: Medicare HMO | Admitting: Internal Medicine

## 2014-02-06 ENCOUNTER — Encounter: Payer: Self-pay | Admitting: Internal Medicine

## 2014-02-06 ENCOUNTER — Telehealth: Payer: Self-pay | Admitting: Family Medicine

## 2014-02-06 VITALS — BP 142/76 | HR 86 | Temp 97.7°F | Wt 147.0 lb

## 2014-02-06 DIAGNOSIS — R05 Cough: Secondary | ICD-10-CM

## 2014-02-06 DIAGNOSIS — J449 Chronic obstructive pulmonary disease, unspecified: Secondary | ICD-10-CM

## 2014-02-06 DIAGNOSIS — R059 Cough, unspecified: Secondary | ICD-10-CM

## 2014-02-06 NOTE — Telephone Encounter (Signed)
Yes she needs to be seen here or by her pulmonologist today.  Otherwise, needs to go to ED.

## 2014-02-06 NOTE — Telephone Encounter (Signed)
Ok to see [place] or pulmonary, as long as she is seen soon.

## 2014-02-06 NOTE — Patient Instructions (Addendum)
Cough, Adult  A cough is a reflex that helps clear your throat and airways. It can help heal the body or may be a reaction to an irritated airway. A cough may only last 2 or 3 weeks (acute) or may last more than 8 weeks (chronic).  CAUSES Acute cough:  Viral or bacterial infections. Chronic cough:  Infections.  Allergies.  Asthma.  Post-nasal drip.  Smoking.  Heartburn or acid reflux.  Some medicines.  Chronic lung problems (COPD).  Cancer. SYMPTOMS   Cough.  Fever.  Chest pain.  Increased breathing rate.  High-pitched whistling sound when breathing (wheezing).  Colored mucus that you cough up (sputum). TREATMENT   A bacterial cough may be treated with antibiotic medicine.  A viral cough must run its course and will not respond to antibiotics.  Your caregiver may recommend other treatments if you have a chronic cough. HOME CARE INSTRUCTIONS   Only take over-the-counter or prescription medicines for pain, discomfort, or fever as directed by your caregiver. Use cough suppressants only as directed by your caregiver.  Use a cold steam vaporizer or humidifier in your bedroom or home to help loosen secretions.  Sleep in a semi-upright position if your cough is worse at night.  Rest as needed.  Stop smoking if you smoke. SEEK IMMEDIATE MEDICAL CARE IF:   You have pus in your sputum.  Your cough starts to worsen.  You cannot control your cough with suppressants and are losing sleep.  You begin coughing up blood.  You have difficulty breathing.  You develop pain which is getting worse or is uncontrolled with medicine.  You have a fever. MAKE SURE YOU:   Understand these instructions.  Will watch your condition.  Will get help right away if you are not doing well or get worse. Document Released: 09/25/2010 Document Revised: 06/21/2011 Document Reviewed: 09/25/2010 ExitCare Patient Information 2015 [employer]. This information is not intended  to replace advice given to you by your health care provider. Make sure you discuss any questions you have with your health care provider. Cough, Adult  A cough is a reflex that helps clear your throat and airways. It can help heal the body or may be a reaction to an irritated airway. A cough may only last 2 or 3 weeks (acute) or may last more than 8 weeks (chronic).  CAUSES Acute cough:  Viral or bacterial infections. Chronic cough:  Infections.  Allergies.  Asthma.  Post-nasal drip.  Smoking.  Heartburn or acid reflux.  Some medicines.  Chronic lung problems (COPD).  Cancer. SYMPTOMS   Cough.  Fever.  Chest pain.  Increased breathing rate.  High-pitched whistling sound when breathing (wheezing).  Colored mucus that you cough up (sputum). TREATMENT   A bacterial cough may be treated with antibiotic medicine.  A viral cough must run its course and will not respond to antibiotics.  Your caregiver may recommend other treatments if you have a chronic cough. HOME CARE INSTRUCTIONS   Only take over-the-counter or prescription medicines for pain, discomfort, or fever as directed by your caregiver. Use cough suppressants only as directed by your caregiver.  Use a cold steam vaporizer or humidifier in your bedroom or home to help loosen secretions.  Sleep in a semi-upright position if your cough is worse at night.  Rest as needed.  Stop smoking if you smoke. SEEK IMMEDIATE MEDICAL CARE IF:   You have pus in your sputum.  Your cough starts to worsen.  You cannot control   your cough with suppressants and are losing sleep.  You begin coughing up blood.  You have difficulty breathing.  You develop pain which is getting worse or is uncontrolled with medicine.  You have a fever. MAKE SURE YOU:   Understand these instructions.  Will watch your condition.  Will get help right away if you are not doing well or get worse. Document Released: 09/25/2010  Document Revised: 06/21/2011 Document Reviewed: 09/25/2010 ExitCare Patient Information 2015 [employer]. This information is not intended to replace advice given to you by your health care provider. Make sure you discuss any questions you have with your health care provider.  

## 2014-02-06 NOTE — Progress Notes (Signed)
HPI  Pt presents to the clinic today with c/o cough, chest congestion and shortness of breath. She reports this started yesterday. She is blowing clear mucous out of her nose. Her cough is non productive. She has run fevers a few days ago. She feels worse today. She has been using her inhalers as prescribed. She has not had to use her inhaler. She does have a history of allergies and COPD. She is on 5 L [place]. She does have a lot of anxiety related to her difficulty breathing- but fairly well controlled on xanax.  Review of Systems      Past Medical History  Diagnosis Date  . Dyspnea   . Esophageal reflux   . CAD (coronary artery disease)   . Hypertension   . Crohn's disease   . Allergic rhinitis   . COPD (chronic obstructive pulmonary disease)   . History of kidney stones   . History of shingles   . Trigeminal neuralgia   . Family history of anesthesia complication     " SISTER HAD BAD FEELINGS "  . Anginal pain   . Anxiety     ' JUST FOR MY BREATHING "  . Arthritis     mild in hands  . Hyperlipidemia     Family History  Problem Relation Age of Onset  . Breast cancer Sister   . COPD Sister   . Stroke Mother   . Prostate cancer Father   . Prostate cancer Brother   . Heart attack Brother     History   Social History  . Marital Status: Widowed    Spouse Name: N/A    Number of Children: N/A  . Years of Education: N/A   Occupational History  . PT [employer]    Social History Main Topics  . Smoking status: Former Smoker -- 1.00 packs/day for 30 years    Types: Cigarettes    Quit date: 04/12/1990  . Smokeless tobacco: Never Used  . Alcohol Use: No  . Drug Use: No  . Sexual Activity: No   Other Topics Concern  . Not on file   Social History Narrative   Has a living will and HPOA- Charlett BlakeDaniel Cressey.   Would desire CPR.   Would not want prolonged life support, would not want feeding tubes.          Allergies  Allergen Reactions  . Avelox [Moxifloxacin Hcl In  Nacl] Shortness Of Breath, Swelling and Rash  . Theophyllines     Slight increased heart rate  . Ciprofloxacin   . Codeine Other (See Comments)    Couldn't breath  . Milk-Related Compounds Other (See Comments)    Hurts stomach.. Whole milk, not all dairy products  . Lisinopril Rash     Constitutional: Denies headache, fatigue, fever or abrupt weight changes.  HEENT:  Positive runny nose. Denies eye redness, eye pain, pressure behind the eyes, facial pain, nasal congestion, ear pain, ringing in the ears, wax buildup, sore throat or bloody nose. Respiratory: Positive cough and shortness of breath. Denies difficulty breathing.  Cardiovascular: Denies chest pain, chest tightness, palpitations or swelling in the hands or feet.   No other specific complaints in a complete review of systems (except as listed in HPI above).  Objective:   BP 142/76  Pulse 86  Temp(Src) 97.7 F (36.5 C) (Oral)  Wt 147 lb (66.679 kg)  SpO2 98% Wt Readings from Last 3 Encounters:  02/06/14 147 lb (66.679 kg)  01/10/14 147 lb 8 oz (66.906 kg)  01/07/14 142 lb (64.411 kg)     General: Appears her stated age, chronically ill appearing in NAD. HEENT: Head: normal shape and size;  Ears: Tm's gray and intact, normal light reflex; Nose: mucosa pink and moist, septum midline; Throat/Mouth: Teeth present, mucosa erythematous and moist, no exudate noted, no lesions or ulcerations noted.  Cardiovascular: Normal rate and rhythm. S1,S2 noted.  No murmur, rubs or gallops noted.  Pulmonary/Chest: Normal effort and coarse vesicular breath sounds. No respiratory distress. No wheezes, rales or ronchi noted.      Assessment & Plan:   Cough due to COPD:  I do not think she is having a exacerbation Mucous is clear, no wheezing on exam Advised her to continue symbicort, albuterol and oxygen Use nebulizer if needed Watch for fever, colored mucous, shortness of breath  If problems persist or worsen, RTC- has follow up  with Dr. [name] 02/2014  RTC as needed or if symptoms persist.

## 2014-02-06 NOTE — Telephone Encounter (Signed)
Spoke to Brandi Cline and advised per Dr Dayton MartesAron; Brandi Cline states that she will keep upcoming appt

## 2014-02-06 NOTE — Progress Notes (Signed)
Pre visit review using our clinic review tool, if applicable. No additional management support is needed unless otherwise documented below in the visit note. 

## 2014-02-06 NOTE — Telephone Encounter (Signed)
Patient Information:  Caller Name: [name]  Phone: [phone]  Patient: Brandi Cline, Brandi Cline  Gender: Female  DOB: 1930/11/10  Age: 80 Years  PCP: [name] [facility])  Office Follow Up:  Does the office need to follow up with this patient?: Yes  Instructions For The Office: see notes  RN Note:  Pt already has appt today at 1:45 with R. Beatty. Advised her to call 911 if breathing becomes difficult, sxs worsen (lives alone). States otherwise, if sxs do not worsen prior to appt, her sister will drive her to appt. Advised her to go ahead and take her Symbicort and Ventolin inhalers after getting off phone since she has not used either one yet today. Agreed to plan. Also assured her will send note for MD review and someone will call her back---ok to wait for appt this afternoon? or needs sooner? or needs ED? Or any other instructions?  Symptoms  Reason For Call & Symptoms: Mild sore throat yesterday with onset of chest congestion and nasal congestion today with mild cough. Denies fever.  Pt has mild chest tightness (denies pain) and some SOB with exertion and is concerned due to hx. While at rest (seated) during call, pt is able to speak in full sentences and triager does not hear any wheezing. Has hx COPD and mult episodes pneumonia with mult hospitalizations.  She wears 5L O2 24 hrs a day all of the time. States she has not taken her Symbicort yet this morning and has not yet used any of her ventolin inhaler either with this illness.   Reviewed Health History In EMR: Yes  Reviewed Medications In EMR: Yes  Reviewed Allergies In EMR: Yes  Reviewed Surgeries / Procedures: Yes  Date of Onset of Symptoms: 02/05/2014  Treatments Tried: Mucinex-D  Treatments Tried Worked: No  Guideline(s) Used:  Breathing Difficulty  Asthma Attack  Disposition Per Guideline:   See Today in Office  Reason For Disposition Reached:   Patient wants to be seen  Advice Given:  Quick-Relief Asthma Medicine:   Start your quick-relief medicine (e.g., albuterol, salbutamol) at the first sign of any coughing or shortness of breath (don't wait for wheezing). Use your inhaler (2 puffs each time) or nebulizer every 4 hours. Continue the quick-relief medicine until you have not wheezed or coughed for 48 hours.  Long-Term-Control Asthma Medicine:  If you are using a controller medicine (e.g., inhaled steroids or cromolyn), continue to take it as directed.  Call Back If:  You become worse.  Patient Will Follow Care Advice:  YES  Appointment Scheduled:  02/06/2014 13:45:00 Appointment Scheduled Provider:  Nicki Reaper

## 2014-02-06 NOTE — Telephone Encounter (Signed)
Spoke to pt and advised per Dr [name]. Pt verbally expressed understanding and states that she is feeling "better." Pt states she is scheduled with RBaity today at 1345. She is questioning if she should keep this appt or be seen by pulmonology.

## 2014-02-10 NOTE — Assessment & Plan Note (Signed)
Related to her severe COPD and possibly also related to hypoxemia of liver disease Plan-continue oxygen

## 2014-02-10 NOTE — Assessment & Plan Note (Signed)
a1AT is an acute phase reactant and can be elevated during infections Plan-Check a1AT phenotype, sample Anoro inhaler

## 2014-02-12 ENCOUNTER — Other Ambulatory Visit: Payer: Self-pay

## 2014-02-12 DIAGNOSIS — Z1231 Encounter for screening mammogram for malignant neoplasm of breast: Secondary | ICD-10-CM

## 2014-03-05 ENCOUNTER — Telehealth: Payer: Self-pay | Admitting: Internal Medicine

## 2014-03-05 MED ORDER — UMECLIDINIUM-VILANTEROL 62.5-25 MCG/INH IN AEPB: 1.0000 | INHALATION_SPRAY | Freq: Every day | RESPIRATORY_TRACT | Status: DC

## 2014-03-05 NOTE — Telephone Encounter (Signed)
Called and spoke with pt and she stated that she has been on symbicort for years and feels that this is no longer helping her and would like to try the anoro again.  She stated that she has tried the anoro in the past and felt that this did help her.  Pt is requesting a sample of the anoro to try and rx sent to the pharmacy.  CY please advise thanks.    Last ov--01/01/2014 Next ov--05/02/2014  Allergies  Allergen Reactions  . Avelox [Moxifloxacin Hcl In Nacl] Shortness Of Breath, Swelling and Rash  . Theophyllines     Slight increased heart rate  . Ciprofloxacin   . Codeine Other (See Comments)    Couldn't breath  . Milk-Related Compounds Other (See Comments)    Hurts stomach.. Whole milk, not all dairy products  . Lisinopril Rash    Current Outpatient Prescriptions on File Prior to Visit  Medication Sig Dispense Refill  . albuterol (PROVENTIL HFA;VENTOLIN HFA) 108 (90 BASE) MCG/ACT inhaler Inhale 2 puffs into the lungs daily as needed for wheezing or shortness of breath.    [name] albuterol (PROVENTIL) (2.5 MG/3ML) 0.083% nebulizer solution Take 2.5 mg by nebulization every 4 (four) hours as needed. Shortness of breath    . ALPRAZolam (XANAX) 0.5 MG tablet Take 1 tablet (0.5 mg total) by mouth 3 (three) times daily as needed for anxiety. For anxiety 270 tablet 0  . Apple Cider Vinegar 500 MG TABS Take 1 tablet by mouth daily.    [name] aspirin 81 MG tablet Take 81 mg by mouth daily.    [name] BIOTIN 5000 PO Take 2 tablets by mouth daily.    . budesonide-formoterol (SYMBICORT) 160-4.5 MCG/ACT inhaler Inhale 2 puffs into the lungs 2 (two) times daily. 1 Inhaler prn  . calcium carbonate (OS-CAL) 600 MG TABS tablet Take 600 mg by mouth 2 (two) times daily with a meal.    . Cholecalciferol (VITAMIN D-3) 5000 UNITS TABS Take 1 tablet by mouth daily.    . Coenzyme Q10 (CO Q 10 PO) Take 2 tablets by mouth daily.     . Cranberry 250 MG TABS Take 2 tablets by mouth.    . cyanocobalamin (,VITAMIN B-12,) 1000  MCG/ML injection Inject 1,000 mcg into the muscle every 30 (thirty) days.    . cyanocobalamin (,VITAMIN B-12,) 1000 MCG/ML injection INJECT INTO THE MUSCLE ONCE A MONTH 1 mL 2  . estradiol (ESTRACE) 0.5 MG tablet Take 0.25 mg by mouth daily.    [name] estradiol (ESTRACE) 0.5 MG tablet TAKE 1/2 TABLET BY MOUTH EVERY DAY. 45 tablet 0  . fish oil-omega-3 fatty acids 1000 MG capsule Take 1 g by mouth 2 (two) times daily.     . furosemide (LASIX) 20 MG tablet Take 20 mg by mouth daily.    . isosorbide mononitrate (IMDUR) 30 MG 24 hr tablet Take 60 mg by mouth daily.    [name] loratadine (CLARITIN) 10 MG tablet Take 10 mg by mouth daily.    [name] losartan (COZAAR) 25 MG tablet Take 25 mg by mouth daily.    . magnesium oxide (MAG-OX) 400 MG tablet Take 400 mg by mouth daily.     . Multiple Vitamin (MULTIVITAMIN) tablet Take 1 tablet by mouth daily.     . nitroGLYCERIN (NITROSTAT) 0.4 MG SL tablet Place 1 tablet (0.4 mg total) under the tongue every 5 (five) minutes as needed. Chest pain 25 tablet 3  . omeprazole (PRILOSEC) 20 MG capsule Take  20 mg by mouth daily.    . potassium chloride SA (K-DUR,KLOR-CON) 20 MEQ tablet Take 1 tablet (20 mEq total) by mouth 2 (two) times daily. Alternate taking 2 one day then 1 the next 60 tablet 3  . simvastatin (ZOCOR) 20 MG tablet Take 20 mg by mouth 3 (three) times a week. Monday, Wednesday, and Friday    . vitamin C (ASCORBIC ACID) 500 MG tablet Take 500 mg by mouth daily.    . vitamin E 1000 UNIT capsule Take 1,000 Units by mouth daily.    . Zinc Acetate, Oral, (ZINC ACETATE PO) Take 1 tablet by mouth daily.     No current facility-administered medications on file prior to visit.

## 2014-03-05 NOTE — Telephone Encounter (Signed)
Called and spoke with pt and she is aware of sample of the anoro that has been left up front.  She stated that she did not want this sent to her pharmacy yet since this is not covered by her insurance yet and will cost her $300.  Pt will come by and pick up the sample left up front.

## 2014-03-05 NOTE — Telephone Encounter (Signed)
Ok as requested- 1 sample and Rx,    1 puff, once daily

## 2014-03-13 IMAGING — CT CT ANGIO CHEST
2 of 7 series · 19 of 36 positions shown · IV contrast (omnipaque)
Comparison: Chest radiographs dated 05/13/2012

CLINICAL DATA: Chest tightness, difficulty breathing, evaluate for
PE

CT ANGIOGRAPHY CHEST
TECHNIQUE: Multidetector CT imaging of the chest using the
standard protocol during bolus administration of intravenous
contrast. Multiplanar reconstructed images including MIPs were
obtained and reviewed to evaluate the vascular anatomy.
Contrast: 80mL OMNIPAQUE IOHEXOL 350 MG/ML SOLN

[Series 6: pulm embolism 1.0 b25f st · axial · 0.63mm/px · z∈[-324,-18]mm · 18 of 340 slices shown]
[im 17/340  lung]
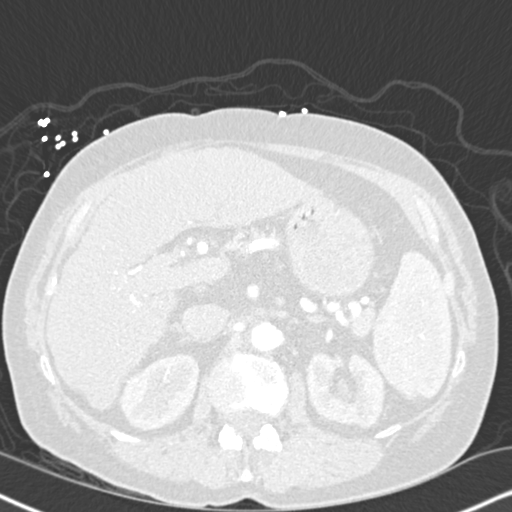
[im 34/340  mediastinal]
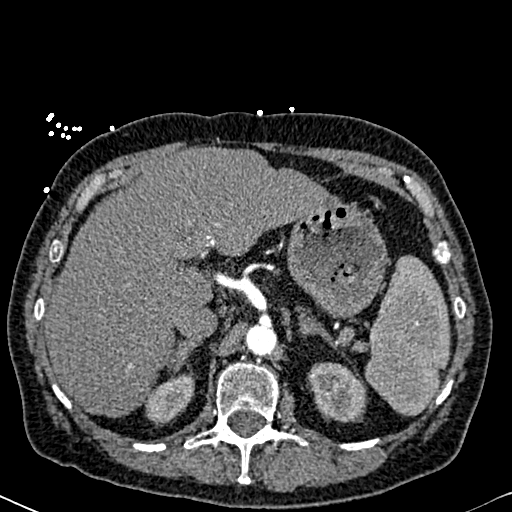
[im 51/340  lung]
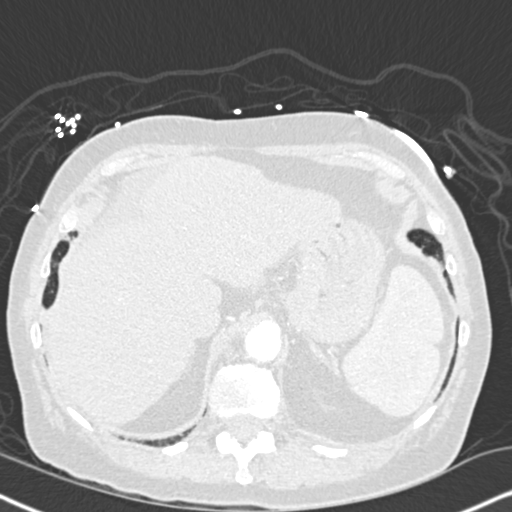
[im 68/340  mediastinal]
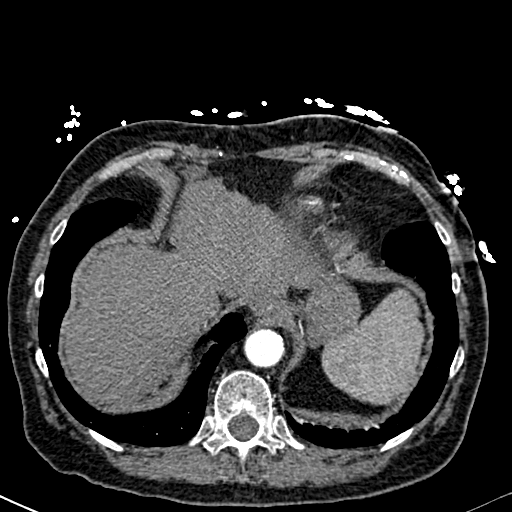
[im 85/340  lung]
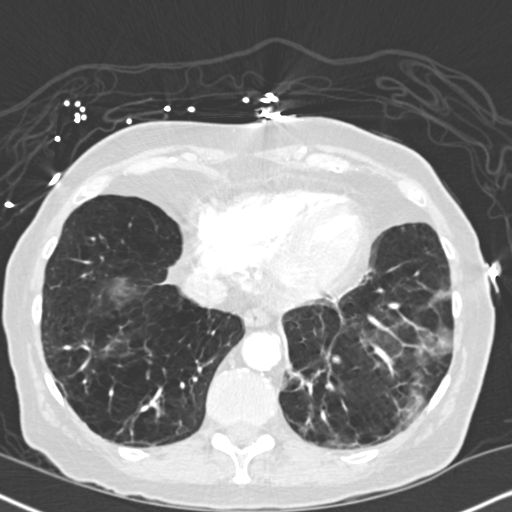
[im 102/340  mediastinal]
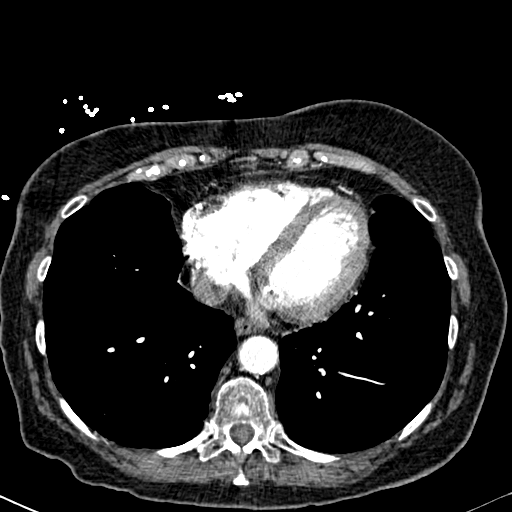
[im 119/340  lung]
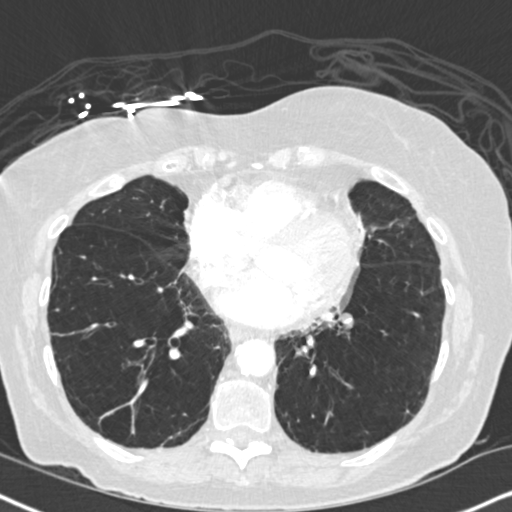
[im 136/340  mediastinal]
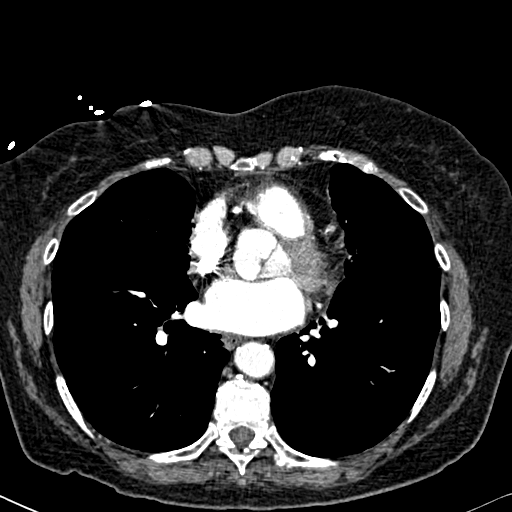
[im 153/340  lung]
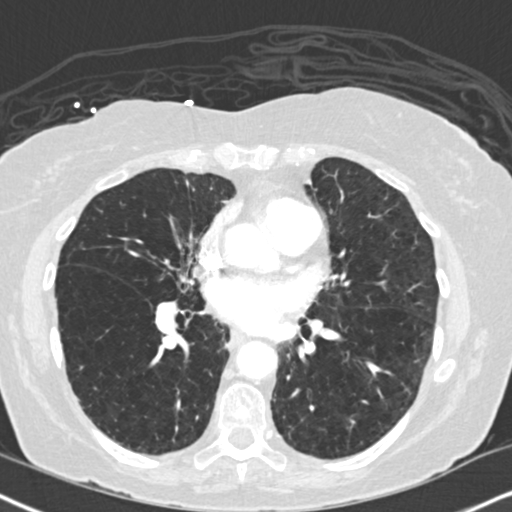
[im 187/340  mediastinal]
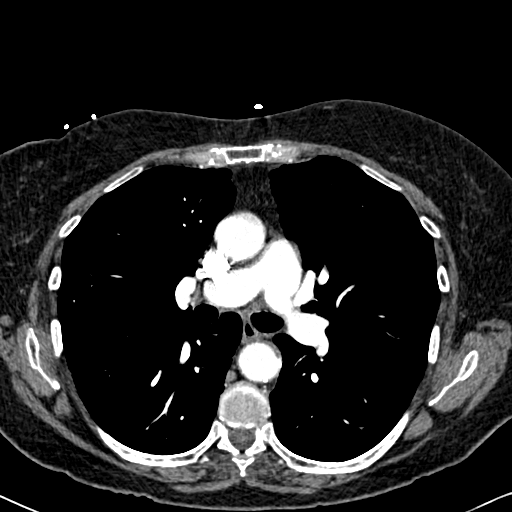
[im 204/340  lung]
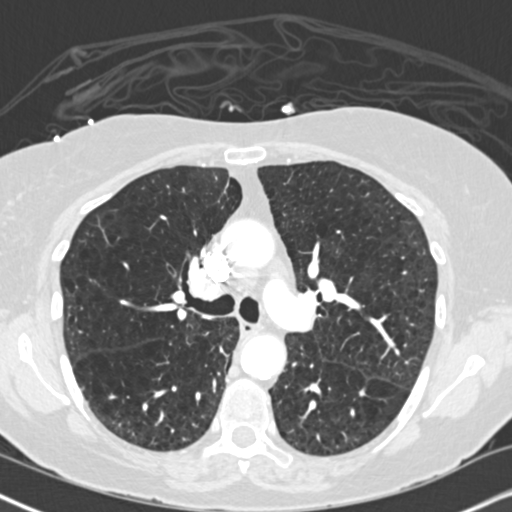
[im 221/340  mediastinal]
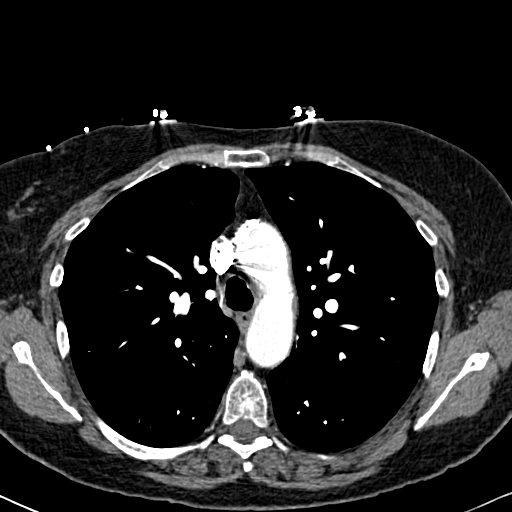
[im 238/340  lung]
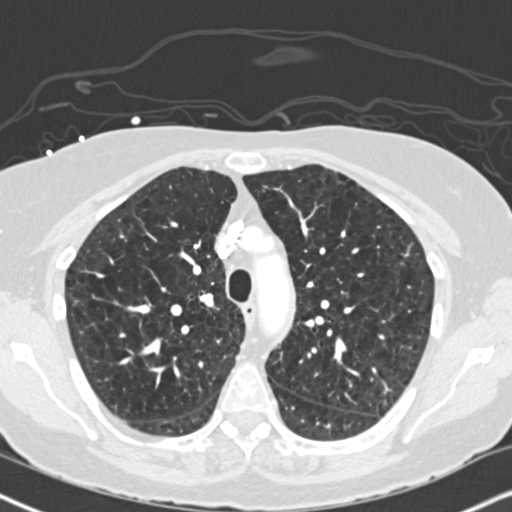
[im 255/340  mediastinal]
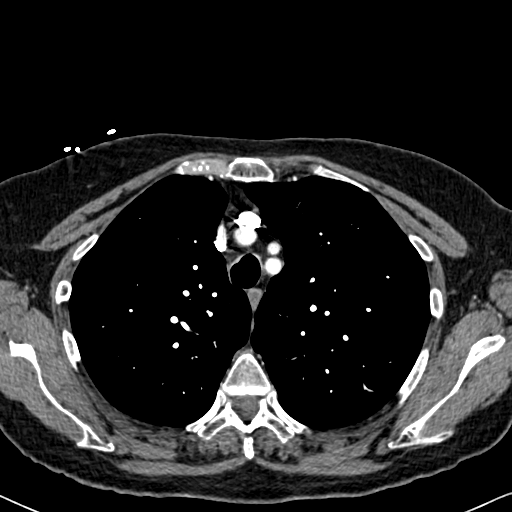
[im 272/340  lung]
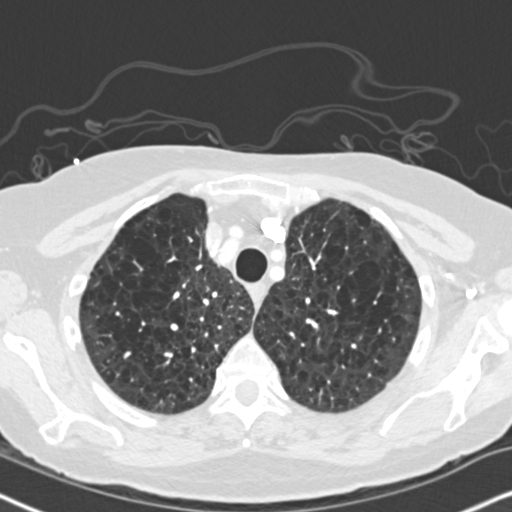
[im 289/340  mediastinal]
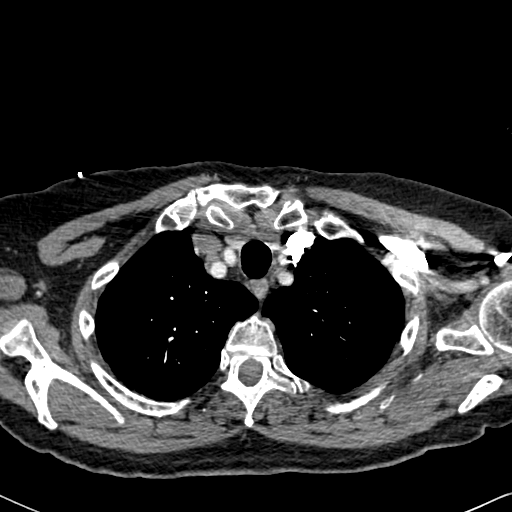
[im 306/340  lung]
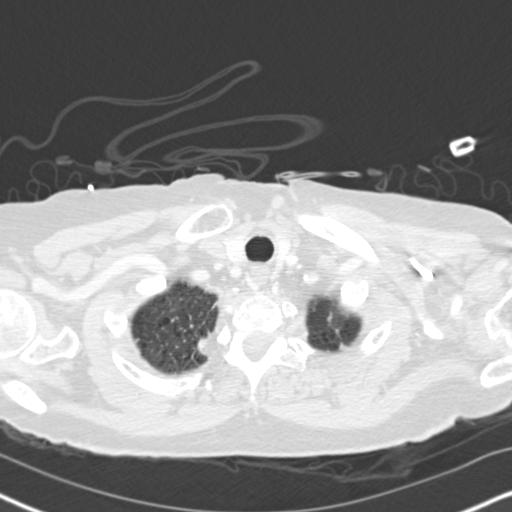
[im 323/340  mediastinal]
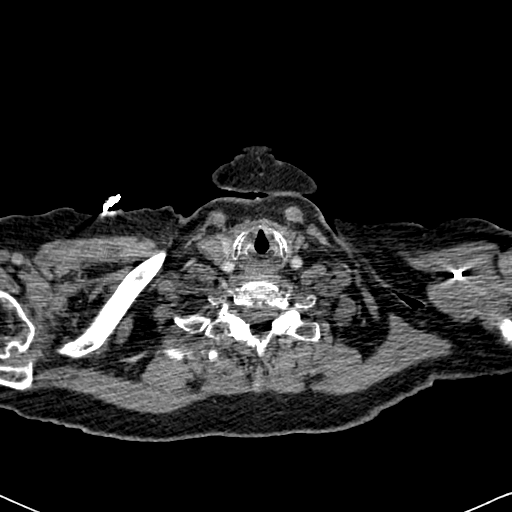

[Series 605: cor mpr · coronal · 0.66mm/px · 1 of 123 slices shown]
[im 62/123  mediastinal]
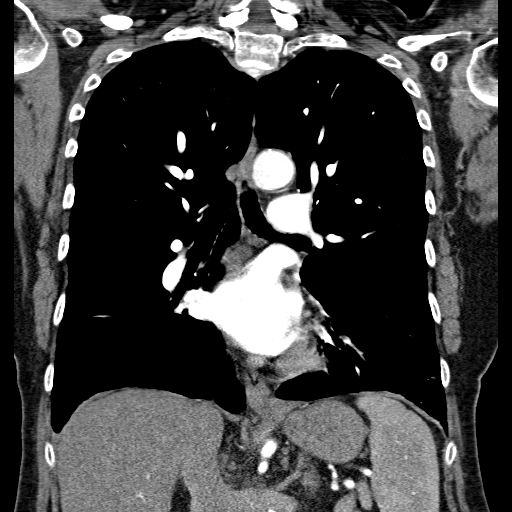

[19 of 36 positions shown; findings below may reference images not displayed]

FINDINGS: No evidence of pulmonary embolism.

Extensive centrilobular emphysematous changes.  No suspicious
pulmonary nodules.  Mild linear scarring at the left lung base.  No
pleural effusion or pneumothorax.

Visualized thyroid is unremarkable.

The heart is normal in size.  No pericardial effusion.  Coronary
atherosclerosis.  Atherosclerotic calcifications of the aortic
arch.

No suspicious mediastinal or axillary lymphadenopathy.

Visualized upper abdomen is notable for cirrhosis and trace
perihepatic ascites.

Mild degenerative changes of the visualized thoracolumbar spine.
IMPRESSION: No evidence of pulmonary embolism.

Extensive centrilobular emphysematous changes.  Left basilar
scarring.

Suspected cirrhosis.

## 2014-03-15 ENCOUNTER — Other Ambulatory Visit: Payer: Self-pay | Admitting: *Deleted

## 2014-03-15 MED ORDER — ALPRAZOLAM 0.5 MG PO TABS: 0.5000 mg | ORAL_TABLET | Freq: Three times a day (TID) | ORAL | Status: DC | PRN

## 2014-03-15 MED ORDER — POTASSIUM CHLORIDE CRYS ER 20 MEQ PO TBCR: 20.0000 meq | EXTENDED_RELEASE_TABLET | Freq: Two times a day (BID) | ORAL | Status: DC

## 2014-03-15 MED ORDER — OMEPRAZOLE 20 MG PO CPDR: 20.0000 mg | DELAYED_RELEASE_CAPSULE | Freq: Every day | ORAL | Status: DC

## 2014-03-15 NOTE — Telephone Encounter (Signed)
Not sure if Dr. [name] gives 90 day supplies on controlled substances, will wait for her to return to address

## 2014-03-15 NOTE — Telephone Encounter (Signed)
Alprazolam called to [facility].

## 2014-03-15 NOTE — Telephone Encounter (Signed)
Received refilled request from [facility] with note that patient a 90 day supply before deductible starts over at the beginning of the year.  Last office visit 02/06/2014 with Nicki Reaper.  Alprazolam last refilled 10/03/2013 for #[id number] with no refill.  Ok to refill?

## 2014-03-16 ENCOUNTER — Other Ambulatory Visit: Payer: Self-pay | Admitting: Family Medicine

## 2014-03-18 ENCOUNTER — Other Ambulatory Visit: Payer: Self-pay | Admitting: Family Medicine

## 2014-03-19 ENCOUNTER — Ambulatory Visit
Admission: RE | Admit: 2014-03-19 | Discharge: 2014-03-19 | Disposition: A | Discharge status: Home / Self Care | Payer: Medicare HMO | Source: Ambulatory Visit

## 2014-03-19 DIAGNOSIS — Z1231 Encounter for screening mammogram for malignant neoplasm of breast: Secondary | ICD-10-CM

## 2014-03-21 ENCOUNTER — Encounter (HOSPITAL_COMMUNITY): Payer: Self-pay | Admitting: Interventional Cardiology

## 2014-04-09 ENCOUNTER — Telehealth: Payer: Self-pay

## 2014-04-09 ENCOUNTER — Telehealth: Payer: Self-pay | Admitting: Internal Medicine

## 2014-04-09 MED ORDER — AMOXICILLIN-POT CLAVULANATE 500-125 MG PO TABS: 1.0000 | ORAL_TABLET | Freq: Two times a day (BID) | ORAL | Status: DC

## 2014-04-09 NOTE — Telephone Encounter (Signed)
Pt c/o productive cough with Acasia Skilton/yellow mucus, SOB, fever 100.8, weak and chest tightness x 1 day. Pt PCP is on vacation and no one is able to see her today.  Pt requesting an abx be sent to [facility] [place] Pt plans to try and get an appt with PCP tomorrow.  Allergies  Allergen Reactions  . Avelox [Moxifloxacin Hcl In Nacl] Shortness Of Breath, Swelling and Rash  . Theophyllines     Slight increased heart rate  . Ciprofloxacin   . Codeine Other (See Comments)    Couldn't breath  . Milk-Related Compounds Other (See Comments)    Hurts stomach.. Whole milk, not all dairy products  . Lisinopril Rash   Please advise Dr Maple HudsonYoung. Thanks.  Current Outpatient Prescriptions on File Prior to Visit  Medication Sig Dispense Refill  . albuterol (PROVENTIL HFA;VENTOLIN HFA) 108 (90 BASE) MCG/ACT inhaler Inhale 2 puffs into the lungs daily as needed for wheezing or shortness of breath.    [name]. albuterol (PROVENTIL) (2.5 MG/3ML) 0.083% nebulizer solution Take 2.5 mg by nebulization every 4 (four) hours as needed. Shortness of breath    . ALPRAZolam (XANAX) 0.5 MG tablet Take 1 tablet (0.5 mg total) by mouth 3 (three) times daily as needed for anxiety. For anxiety 270 tablet 0  . Apple Cider Vinegar 500 MG TABS Take 1 tablet by mouth daily.    [name]. aspirin 81 MG tablet Take 81 mg by mouth daily.    [name]. BIOTIN 5000 PO Take 2 tablets by mouth daily.    . budesonide-formoterol (SYMBICORT) 160-4.5 MCG/ACT inhaler Inhale 2 puffs into the lungs 2 (two) times daily. 1 Inhaler prn  . calcium carbonate (OS-CAL) 600 MG TABS tablet Take 600 mg by mouth 2 (two) times daily with a meal.    . Cholecalciferol (VITAMIN D-3) 5000 UNITS TABS Take 1 tablet by mouth daily.    . Coenzyme Q10 (CO Q 10 PO) Take 2 tablets by mouth daily.     . Cranberry 250 MG TABS Take 2 tablets by mouth.    . cyanocobalamin (,VITAMIN B-12,) 1000 MCG/ML injection Inject 1,000 mcg into the muscle every 30 (thirty) days.    . cyanocobalamin  (,VITAMIN B-12,) 1000 MCG/ML injection INJECT 1ML INTO THE MUSCLE ONCE A MONTH 1 mL 2  . estradiol (ESTRACE) 0.5 MG tablet Take 0.25 mg by mouth daily.    [name]. estradiol (ESTRACE) 0.5 MG tablet TAKE 1/2 TABLET BY MOUTH EVERY DAY. 45 tablet 0  . estradiol (ESTRACE) 0.5 MG tablet TAKE 1/2 TABLET BY MOUTH ONCE DAILY 45 tablet 0  . fish oil-omega-3 fatty acids 1000 MG capsule Take 1 g by mouth 2 (two) times daily.     . furosemide (LASIX) 20 MG tablet Take 20 mg by mouth daily.    . isosorbide mononitrate (IMDUR) 30 MG 24 hr tablet Take 60 mg by mouth daily.    [name]. loratadine (CLARITIN) 10 MG tablet Take 10 mg by mouth daily.    [name]. losartan (COZAAR) 25 MG tablet Take 25 mg by mouth daily.    . magnesium oxide (MAG-OX) 400 MG tablet Take 400 mg by mouth daily.     . Multiple Vitamin (MULTIVITAMIN) tablet Take 1 tablet by mouth daily.     . nitroGLYCERIN (NITROSTAT) 0.4 MG SL tablet Place 1 tablet (0.4 mg total) under the tongue every 5 (five) minutes as needed. Chest pain 25 tablet 3  . omeprazole (PRILOSEC) 20 MG capsule Take 1 capsule (20 mg total) by  mouth daily. 90 capsule 1  . potassium chloride SA (K-DUR,KLOR-CON) 20 MEQ tablet Take 1 tablet (20 mEq total) by mouth 2 (two) times daily. Alternate taking 2 one day then 1 the next 180 tablet 1  . simvastatin (ZOCOR) 20 MG tablet Take 20 mg by mouth 3 (three) times a week. Monday, Wednesday, and Friday    . Umeclidinium-Vilanterol (ANORO ELLIPTA) 62.5-25 MCG/INH AEPB Inhale 1 puff into the lungs daily. 1 each 0  . vitamin C (ASCORBIC ACID) 500 MG tablet Take 500 mg by mouth daily.    . vitamin E 1000 UNIT capsule Take 1,000 Units by mouth daily.    . Zinc Acetate, Oral, (ZINC ACETATE PO) Take 1 tablet by mouth daily.     No current facility-administered medications on file prior to visit.

## 2014-04-09 NOTE — Telephone Encounter (Signed)
Will see her tomorrow since she refuses ER visit  If condition worsens overnight she should still go to ER  Will cc to her PCP as well

## 2014-04-09 NOTE — Telephone Encounter (Signed)
Called spoke with patient and advised of CY's recommendations as stated below Pt voiced her understanding and denied any questions/concerns - she did ask that the rx be telephoned directly as her pharmacy closes at 6pm.   Rx has been telephoned to [facility], pharmacist Amy.  Nothing further needed at this time; will sign off.

## 2014-04-09 NOTE — Telephone Encounter (Signed)
Brandi Cline advise me that she has instructed pt to go to ER over night if sxs worsen

## 2014-04-09 NOTE — Telephone Encounter (Signed)
Offer augmentin 500 mg, 3 14, 1 twice daily

## 2014-04-09 NOTE — Telephone Encounter (Signed)
PLEASE NOTE: All timestamps contained within this report are represented as [place] Standard Time. CONFIDENTIALTY NOTICE: This fax transmission is intended only for the addressee. It contains information that is legally privileged, confidential or otherwise protected from use or disclosure. If you are not the intended recipient, you are strictly prohibited from reviewing, disclosing, copying using or disseminating any of this information or taking any action in reliance on or regarding this information. If you have received this fax in error, please notify us immediately by telephone so that we can arrange for its return to [place]. Phone: [phone], Toll-Free: [phone], Fax: [phone] Page: 1 of 2 Call Id: [id number] [facility] Day - Client TELEPHONE ADVICE RECORD [facility] Patient Name: Brandi Cline Gender: Female DOB: Mar 07, 1931 Age: 80 Y 1 M 20 D Return Phone Number: [phone] (Primary) Address: [address] City/State/Zip: [place] [address] Client [facility] Day - Client Client Site [facility] - Day Physician [name] Contact Type Call Call Type Triage / Clinical Relationship To Patient Self Return Phone Number [phone] (Primary) Chief Complaint CHEST PAIN (>=21 years) - pain, pressure, heaviness or tightness Initial Comment caller states she has COPD, chest tightness, cough, congestion PreDisposition Call Doctor Nurse Assessment Nurse: Sherilyn Cooter, RN, Thurmond Butts Date/Time Lamount Cohen Time): 04/09/2014 11:49:42 AM Confirm and document reason for call. If symptomatic, describe symptoms. ---Caller states that she has COPD with some chest tightness which is a little worse.. She does have angina. She has had a cough x 3-4 days and has some congestion. She has taken IBU and Mucinex, but it didn't seem to help, she is getting worse. She has SOB with both exertion and at rest. Has the  patient traveled out of the country within the last 30 days? ---No Does the patient require triage? ---Yes Related visit to physician within the last 2 weeks? ---No Does the PT have any chronic conditions? (i.e. diabetes, asthma, etc.) ---Yes List chronic conditions. ---COPD, HTN, Crohn's Guidelines Guideline Title Affirmed Question Affirmed Notes Nurse Date/Time (Eastern Time) Breathing Difficulty [1] MODERATE difficulty breathing (e.g., speaks in phrases, SOB even at rest, pulse 100-120) AND [2] NEWonset or WORSE than normal worse than normal, usually doesn't have it at rest. Sherilyn Cooter, RN, Thurmond Butts 04/09/2014 11:53:16 AM Disp. Time Lamount Cohen Time) Disposition Final User 04/09/2014 11:48:42 AM Send to Urgent Queue Alfonse Alpers 04/09/2014 11:55:00 AM Go to ED Now Yes Sherilyn Cooter, RN, Sullivan Lone NOTE: All timestamps contained within this report are represented as [place] Standard Time. CONFIDENTIALTY NOTICE: This fax transmission is intended only for the addressee. It contains information that is legally privileged, confidential or otherwise protected from use or disclosure. If you are not the intended recipient, you are strictly prohibited from reviewing, disclosing, copying using or disseminating any of this information or taking any action in reliance on or regarding this information. If you have received this fax in error, please notify us immediately by telephone so that we can arrange for its return to [place]. Phone: [phone], Toll-Free: [phone], Fax: [phone] Page: 2 of 2 Call Id: [id number] Caller Understands: Yes Disagree/Comply: Disagree Disagree/Comply Reason: Wait and see Care Advice Given Per Guideline GO TO ED NOW: You need to be seen in the Emergency Department. Go to the ER at ___________ Hospital. Leave now. Drive carefully. * Another adult should drive. * Please bring a list of your current medicines when you go to the Emergency Department (ER). After Care  Instructions Given Call Event  Type User Date / Time Description Comments User: Ronney AstersWade, Henry, RN Date/Time Lamount Cohen(Eastern Time): 04/09/2014 11:56:20 AM She wants to try to "tough it out" until her appointment tomorrow. If she gets worse, she will go to ER as instructed, her sister lives next door. Referrals GO TO FACILITY REFUSED

## 2014-04-10 ENCOUNTER — Ambulatory Visit (INDEPENDENT_AMBULATORY_CARE_PROVIDER_SITE_OTHER): Payer: Medicare HMO | Admitting: Family Medicine

## 2014-04-10 ENCOUNTER — Ambulatory Visit (INDEPENDENT_AMBULATORY_CARE_PROVIDER_SITE_OTHER)
Admission: RE | Admit: 2014-04-10 | Discharge: 2014-04-10 | Disposition: A | Discharge status: Home / Self Care | Payer: Medicare HMO | Source: Ambulatory Visit | Attending: Family Medicine | Admitting: Family Medicine

## 2014-04-10 ENCOUNTER — Encounter: Payer: Self-pay | Admitting: Family Medicine

## 2014-04-10 VITALS — BP 134/54 | HR 90 | Temp 98.0°F | Ht 64.0 in | Wt 146.2 lb

## 2014-04-10 DIAGNOSIS — J441 Chronic obstructive pulmonary disease with (acute) exacerbation: Secondary | ICD-10-CM

## 2014-04-10 NOTE — Progress Notes (Signed)
Subjective:    Patient ID: Brandi Cline, female    DOB: [birth year], 80 y.o.   MRN: [id number]  HPI Here for a cold with copd  Cold symptoms started 2-3 days ago  She has been around children   Dr [name] called in augmentin last night - has had 2 doses   She has had elevated temp 100.8 yesterday  Taking ibuprofen - and that helps  02 sat is 95% - on 4L of o2   (usually has 3 L on her portable 02)  Coughing up green/ yellow mucous  She is worried about pneumonia  A little stuffy nose No ST  No ear pain   Does not feel like she is wheezing a lot  Not on prednisone  Uses albuterol prn -averages twice per day  Uses symbicort on a schedule    Patient Active Problem List   Diagnosis Date Noted  . History of nephrolithiasis 01/10/2014  . Complicated UTI (urinary tract infection) 01/10/2014  . Splenic varices 10/25/2013  . Cirrhosis of liver 10/03/2013  . CKD (chronic kidney disease), stage II 09/20/2013  . HLD (hyperlipidemia) 06/13/2013  . Post herpetic neuralgia 06/13/2013  . Chronic diastolic congestive heart failure, NYHA class 1 04/25/2013  . Hypokalemia 07/08/2012  . Chronic respiratory failure with hypoxia 05/13/2012  . HYPERTENSION 03/21/2007  . CAD 03/21/2007  . ALLERGIC RHINITIS 03/21/2007  . COPD with emphysema 03/21/2007  . Esophageal reflux 03/21/2007  . CROHN'S DISEASE 03/21/2007  . DYSPNEA 03/21/2007   Past Medical History  Diagnosis Date  . Dyspnea   . Esophageal reflux   . CAD (coronary artery disease)   . Hypertension   . Crohn's disease   . Allergic rhinitis   . COPD (chronic obstructive pulmonary disease)   . History of kidney stones   . History of shingles   . Trigeminal neuralgia   . Family history of anesthesia complication     " SISTER HAD BAD FEELINGS "  . Anginal pain   . Anxiety     ' JUST FOR MY BREATHING "  . Arthritis     mild in hands  . Hyperlipidemia    Past Surgical History  Procedure Laterality Date  . Coronary stents     . Lithotripsy    . Ureteral stent placement    . Cholecystectomy    . Total abdominal hysterectomy    . Partial resections large and small bowel for crohn's    . Breast lumps benign    . Left heart catheterization with coronary angiogram N/A 10/28/2011    Procedure: LEFT HEART CATHETERIZATION WITH CORONARY ANGIOGRAM;  Surgeon: Lesleigh Noe, MD;  Location[facility] C[facility] LAB;  Service: Cardiovascular;  Laterality: N/A;   History  Substance Use Topics  . Smoking status: Former Smoker -- 1.00 packs/day for 30 years    Types: Cigarettes    Quit date: 04/12/1990  . Smokeless tobacco: Never Used  . Alcohol Use: No   Family History  Problem Relation Age of Onset  . Breast cancer Sister   . COPD Sister   . Stroke Mother   . Prostate cancer Father   . Prostate cancer Brother   . Heart attack Brother    Allergies  Allergen Reactions  . Avelox [Moxifloxacin Hcl In Nacl] Shortness Of Breath, Swelling and Rash  . Theophyllines     Slight increased heart rate  . Ciprofloxacin   . Codeine Other (See Comments)    Couldn't breath  .  Milk-Related Compounds Other (See Comments)    Hurts stomach.. Whole milk, not all dairy products  . Lisinopril Rash   Current Outpatient Prescriptions on File Prior to Visit  Medication Sig Dispense Refill  . albuterol (PROVENTIL HFA;VENTOLIN HFA) 108 (90 BASE) MCG/ACT inhaler Inhale 2 puffs into the lungs daily as needed for wheezing or shortness of breath.    [name] albuterol (PROVENTIL) (2.5 MG/3ML) 0.083% nebulizer solution Take 2.5 mg by nebulization every 4 (four) hours as needed. Shortness of breath    . ALPRAZolam (XANAX) 0.5 MG tablet Take 1 tablet (0.5 mg total) by mouth 3 (three) times daily as needed for anxiety. For anxiety 270 tablet 0  . amoxicillin-clavulanate (AUGMENTIN) 500-125 MG per tablet Take 1 tablet (500 mg total) by mouth 2 (two) times daily. 14 tablet 0  . Apple Cider Vinegar 500 MG TABS Take 1 tablet by mouth daily.    [name] aspirin 81 MG  tablet Take 81 mg by mouth daily.    [name] BIOTIN 5000 PO Take 2 tablets by mouth daily.    . budesonide-formoterol (SYMBICORT) 160-4.5 MCG/ACT inhaler Inhale 2 puffs into the lungs 2 (two) times daily. 1 Inhaler prn  . calcium carbonate (OS-CAL) 600 MG TABS tablet Take 600 mg by mouth 2 (two) times daily with a meal.    . Cholecalciferol (VITAMIN D-3) 5000 UNITS TABS Take 1 tablet by mouth daily.    . Coenzyme Q10 (CO Q 10 PO) Take 2 tablets by mouth daily.     . Cranberry 250 MG TABS Take 2 tablets by mouth.    . cyanocobalamin (,VITAMIN B-12,) 1000 MCG/ML injection Inject 1,000 mcg into the muscle every 30 (thirty) days.    . cyanocobalamin (,VITAMIN B-12,) 1000 MCG/ML injection INJECT INTO THE MUSCLE ONCE A MONTH 1 mL 2  . estradiol (ESTRACE) 0.5 MG tablet Take 0.25 mg by mouth daily.    [name] estradiol (ESTRACE) 0.5 MG tablet TAKE 1/2 TABLET BY MOUTH EVERY DAY. 45 tablet 0  . estradiol (ESTRACE) 0.5 MG tablet TAKE 1/2 TABLET BY MOUTH ONCE DAILY 45 tablet 0  . fish oil-omega-3 fatty acids 1000 MG capsule Take 1 g by mouth 2 (two) times daily.     . furosemide (LASIX) 20 MG tablet Take 20 mg by mouth daily.    . isosorbide mononitrate (IMDUR) 30 MG 24 hr tablet Take 60 mg by mouth daily.    [name] loratadine (CLARITIN) 10 MG tablet Take 10 mg by mouth daily.    [name] losartan (COZAAR) 25 MG tablet Take 25 mg by mouth daily.    . magnesium oxide (MAG-OX) 400 MG tablet Take 400 mg by mouth daily.     . Multiple Vitamin (MULTIVITAMIN) tablet Take 1 tablet by mouth daily.     . nitroGLYCERIN (NITROSTAT) 0.4 MG SL tablet Place 1 tablet (0.4 mg total) under the tongue every 5 (five) minutes as needed. Chest pain 25 tablet 3  . omeprazole (PRILOSEC) 20 MG capsule Take 1 capsule (20 mg total) by mouth daily. 90 capsule 1  . potassium chloride SA (K-DUR,KLOR-CON) 20 MEQ tablet Take 1 tablet (20 mEq total) by mouth 2 (two) times daily. Alternate taking 2 one day then 1 the next 180 tablet 1  . simvastatin (ZOCOR)  20 MG tablet Take 20 mg by mouth 3 (three) times a week. Monday, Wednesday, and Friday    . Umeclidinium-Vilanterol (ANORO ELLIPTA) 62.5-25 MCG/INH AEPB Inhale 1 puff into the lungs daily. 1 each 0  .  vitamin C (ASCORBIC ACID) 500 MG tablet Take 500 mg by mouth daily.    . vitamin E 1000 UNIT capsule Take 1,000 Units by mouth daily.    . Zinc Acetate, Oral, (ZINC ACETATE PO) Take 1 tablet by mouth daily.     No current facility-administered medications on file prior to visit.     Review of Systems Review of Systems  Constitutional: pos for malaise and intermittent elevated temp ENT pos for congestion and drainage   Eyes: Negative for pain and visual disturbance.  Respiratory: pos for cough/ sob/ wheeze  Cardiovascular: Negative for cp or palpitations    Gastrointestinal: Negative for nausea, diarrhea and constipation.  Genitourinary: Negative for urgency and frequency.  Skin: Negative for pallor or rash   Neurological: Negative for weakness, light-headedness, numbness and headaches.  Hematological: Negative for adenopathy. Does not bruise/bleed easily.  Psychiatric/Behavioral: Negative for dysphoric mood. The patient is not nervous/anxious.         Objective:   Physical Exam  Constitutional: She appears well-developed and well-nourished. No distress.  HENT:  Head: Normocephalic and atraumatic.  Right Ear: External ear normal.  Left Ear: External ear normal.  Mouth/Throat: Oropharynx is clear and moist. No oropharyngeal exudate.  Nares are injected and congested  No sinus tenderness Throat is clear Clear rhinorrhea   Eyes: Conjunctivae and EOM are normal. Pupils are equal, round, and reactive to light. Right eye exhibits no discharge. Left eye exhibits no discharge. No scleral icterus.  Neck: Normal range of motion. Neck supple.  Cardiovascular: Normal rate and regular rhythm.   Pulmonary/Chest: Effort normal. No respiratory distress. She has wheezes. She has no rales. She  exhibits no tenderness.  Diffusely distant bs  Harsh bs with rhonchi Scant wheezes  No rales  Prolonged exp phase   Lymphadenopathy:    She has no cervical adenopathy.  Neurological: She is alert.  Skin: Skin is warm and dry. No rash noted. No erythema. No pallor.  Psychiatric: She has a normal mood and affect.          Assessment & Plan:   Problem List Items Addressed This Visit      Respiratory   Acute exacerbation of chronic obstructive pulmonary disease (COPD) - Primary    Reassuring exam today - fair air exch and good pulse ox  Pt is worried about early pneumonia - check cxr today Continue augmentin  mucinex prn   Update if not starting to improve in a week or if worsening      Relevant Orders      DG Chest 2 View (Completed)

## 2014-04-10 NOTE — Assessment & Plan Note (Signed)
Reassuring exam today - fair air exch and good pulse ox  Pt is worried about early pneumonia - check cxr today Continue augmentin  mucinex prn   Update if not starting to improve in a week or if worsening

## 2014-04-10 NOTE — Patient Instructions (Signed)
Chest xray today  Continue your augmentin  If wheezing worsens - let us or pulmonary know  Continue mucinex   Update if not starting to improve in a week or if worsening

## 2014-04-10 NOTE — Progress Notes (Signed)
Pre visit review using our clinic review tool, if applicable. No additional management support is needed unless otherwise documented below in the visit note. 

## 2014-04-25 ENCOUNTER — Other Ambulatory Visit: Payer: Self-pay | Admitting: Family Medicine

## 2014-05-02 ENCOUNTER — Encounter: Payer: Self-pay | Admitting: Internal Medicine

## 2014-05-02 ENCOUNTER — Ambulatory Visit (INDEPENDENT_AMBULATORY_CARE_PROVIDER_SITE_OTHER): Payer: Medicare HMO | Admitting: Internal Medicine

## 2014-05-02 VITALS — BP 110/60 | HR 70 | Ht 64.0 in | Wt 147.2 lb

## 2014-05-02 DIAGNOSIS — J441 Chronic obstructive pulmonary disease with (acute) exacerbation: Secondary | ICD-10-CM

## 2014-05-02 DIAGNOSIS — J9611 Chronic respiratory failure with hypoxia: Secondary | ICD-10-CM

## 2014-05-02 MED ORDER — TIOTROPIUM BROMIDE-OLODATEROL 2.5-2.5 MCG/ACT IN AERS: 2.0000 | INHALATION_SPRAY | Freq: Every day | RESPIRATORY_TRACT | Status: DC

## 2014-05-02 MED ORDER — AMOXICILLIN-POT CLAVULANATE 875-125 MG PO TABS: 1.0000 | ORAL_TABLET | Freq: Two times a day (BID) | ORAL | Status: DC

## 2014-05-02 NOTE — Progress Notes (Signed)
Subjective:    Patient ID: Brandi Cline, female    DOB: 10-09-1930, 81 y.o.   MRN: [id number] Acute visit- Dr Shelle Iron 09/28/11- The patient comes in today for an acute sick visit.  She has known significant COPD, and is usually followed by Dr. [name].  She gives a 2 to three-day history of increasing shortness of breath, and this culminated in severe shortness of breath this morning.  She is currently a little better after using her morning bronchodilators and her neb treatment.  She has no significant cough, mucus, or congestion.  She does feel a chest heaviness, especially on the left side.  She has a history of coronary disease with stents, but thinks that chest discomfort is different from her usual angina.  Surprisingly, her oxygen saturations today are excellent.  12/06/11 - 80 yoF followed for COPD, complicated by allergic rhinitis, GERD, Crohns Disease, CAD/ stents Patient states had pneumonia/ hosp/ heart cath( ok w/ no new stents) in June. States breathing is a little worse since last office visit. States wears oxygen on 2L as needed. c/o sob, wheezing, and chest tightness every now and then. Denies chest pain and cough. Occasional scant clear mucus. Denies chest pain. Notices dyspnea mainly with exertion, worse in humid weather. CXR 10/08/11-reviewed with her IMPRESSION:  COPD/chronic changes. No active disease.  Original Report Authenticated By: [name], M.D.   03/17/12- 80 yoF followed for COPD, complicated by allergic rhinitis, GERD, Crohns Disease, CAD/ stents FOLLOWS FOR: feels like symibcort and Spiriva not helping anymore-would like something different and cheaper Sister here 2 URI's since August treated by PCP. Needed prednisone.  Feels Symbicort wear off before 12 hour next dose. Wearing O2 more and using neb twice daily.  Little cough- scant clear mucus. Aware of angina despite isorbide. Feet swell- varies, not new or progressive. CXR 10/07/11- COPD  04/27/12-  81 yoF  followed for COPD, complicated by allergic rhinitis, GERD, Crohns Disease, CAD/ stents FOLLOWS FOR: Increased SOB today-unsure of cause(used nebulizer before OV-not much  difference), did not hear any wheezing. Denies any cough or chest congestion Often feels tight at night. Aware of increased fluid retention occasionally. Denies chest pain, infection, wheeze or cough. Last visit we gave sample Breo ellipta, which she blames for fluid retention. Continues oxygen 2 L/Advanced for sleep and as needed  12/19/12- 81 yoF former smoker followed for COPD, complicated by allergic rhinitis, GERD, Crohns Disease, CAD/ stents     Family here. ACUTE:  Increased SOB, tightness that travels around to back and some wheezing.  Sore throat in am and  scratchy all day Continues oxygen 3 L/Advanced for sleep and as needed. She has been more dyspneic over past year. We reviewed Chest CT from the spring, noting it was done because of dyspnea then. Hosp 3/26-07/12/12 for AECOPD with NAD on CXR.  Now 3-4 weeks variable hoarseness- on magic mouthwash and prednisone taper. Some difficulty swallowing.  No cough, scant deep yellow sputum, some tight. No pain, blood, fever, palpitation. Easy DOE needing 3LO2 rest, 4-5L exertion in home. CT chest 07/05/12 IMPRESSION:  . Stable hyperinflation and chronic interstitial prominence. Again  noted left basilar scarring. No acute infiltrate or pulmonary  edema.  Original Report Authenticated By: [name], M.D. CXR 07/10/12 IMPRESSION:  No acute cardiopulmonary abnormality seen. Chronic findings as  described above.  Original Report Authenticated By: [name]., M.D.  01/30/13- 28 yoF former smoker followed for COPD, complicated by allergic rhinitis, GERD, Crohns Disease, CAD/ stents  Family here. FOLLOWS FOR: "weak as water"; swelling in feet, staying tired. Good and bad days Weight gain 10 pounds in the past month. Ankles swell. Little cough, scant yellow. Pain mid  thoracic spine. Continues O2 4L/ Advanced. Theophylline was no help and caused palpitations, so quit. Last prednisone 2 weeks ago. ENT/Dr. [name] saw her in September and diagnosed esophageal reflux and thrush. He doubled her acid blocker.  03/20/13- 73 yoF former smoker followed for COPD, complicated by allergic rhinitis, GERD, Crohns Disease, CAD/ stents   Daughter here FOLLOWS FOR: Breathing is unchanged. Reports SOB and coughing. Denies chest tightness or wheezing.   Dr. Kevan Ny is treating with Lasix. She is off of prednisone. Morning cough with clear mucus. Using Flutter device for pulmonary toilet. Medications discussed. Hosp for AECOPD 05/13/12- 05/15/12 CXR 01/30/13 IMPRESSION:  Bibasilar linear opacities, consistent with atelectasis or scarring.  Emphysematous changes.  Electronically Signed  By: [name] M.D.  On: 01/30/2013 16:54  07/03/13- 24 yoF former smoker followed for COPD, complicated by allergic rhinitis, GERD, Crohns Disease, CAD/ stents   Daughter here FOLLOWS FOR: Symbicort works well for patient about 4-5 hours and then uses rescue inhaler; using neb treatments as needed; but unsure if Spiriva is actually helping. Wonders if she can try [name] again as she has had past 2 days without any side effects. Now finishing prednisone and Z-Pak from primary physician. Denies any benefit from [facility]. Using either nebulizer or Proair once or twice daily.  09/18/13- 35 yoF former smoker followed for COPD, complicated by allergic rhinitis, GERD, Crohns Disease, CAD/ stents   Daughter here FOLLOWS FOR: Pt states breathing has worsened since last OV. Pt states the theophyillne isnt helping. Pt states she has an increase in SOB. C/o mild productive cough with clear mucus and chest tightness with deep breaths.   10/30/13- 82 yoF former smoker followed for COPD/ E, complicated by allergic rhinitis, GERD, Crohns Disease, CAD/ stents   Daughter here FOLLOWS FOR: Using up to 5l/m cont  at home; having a hard time breathing-heat gets her worse.  CT chest 09/19/13 IMPRESSION:  No evidence of significant pulmonary embolus. Extensive  emphysematous changes in the lungs.  Electronically Signed  By: Burman Nieves M.D.  On: 09/19/2013 22:10  01/01/14- 82 yoF former smoker followed for COPD/ E, complicated by allergic rhinitis, GERD, Crohns Disease, CAD/ stents   Daughter here FOLLOWS FOR: continues ot use 5L/M cont at home and 3L/m pulse when out; DME is [facility]. Could tell that Anoro helped better than Symbicort. Anoro is not on her Formulary list through insurance. Never really cleared the cough and yellow sputum after bronchitis in December 05/02/14- 83 yoF former smoker followed for COPD/ E, complicated by allergic rhinitis, GERD, Crohns Disease, CAD/ stents   Daughter here  FOLLOWS FOR: has noticed she has to wear O2 more during the day at 5.5L now.  Pt would like to discuss other options for meds as she feels the Anoro  and albuterol is no longer holding her breathing stable enough.  Asks help for a tier reduction on Symbicort. I suggested we try alternatives first. She wants medications that last longer between doses and feels her heart rhythm is controlled.. She wanted to try Symbicort 3 times daily. CXR 04/10/14 IMPRESSION: 1. Acute cardiopulmonary disease.- should read "NO acute pulmonary disease  -CDY 2. Pleural parenchymal scarring . Electronically Signed  By: Maisie Fus Register  On: 04/10/2014 11:53  ROS-see HPI Constitutional:   No-   weight loss, night  sweats, fevers, chills, fatigue, lassitude. HEENT:   No-  headaches, +difficulty swallowing, tooth/dental problems, +sore throat,       No-  sneezing, itching, ear ache, nasal congestion, post nasal drip,  CV:  No-   chest pain, orthopnea, PND, +swelling in lower extremities, anasarca, dizziness, palpitations Resp: +  shortness of breath with exertion or at rest.             No-  productive cough, no- non-productive  cough,  No- coughing up of blood.              No- change in color of mucus.  No- wheezing.   Skin: No-rash or lesions. GI:  + heartburn, indigestion, no-abdominal pain, nausea, vomiting,  GU:  MS:  No-   joint pain or swelling.  + back pain Neuro-     nothing unusual Psych:  No- change in mood or affect. No depression or anxiety.  No memory loss.  Objective:  OBJ- Physical Exam General- Alert, Oriented, Affect-appropriate, Distress- none acute, wheelchair, O2 3L Skin- rash-none, lesions- none, excoriation- none Lymphadenopathy- none Head- atraumatic            Eyes- Gross vision intact, PERRLA, conjunctivae and secretions clear            Ears- +Hearing aids            Nose- Clear, no-Septal dev, mucus, polyps, erosion, perforation             Throat- Mallampati II , mucosa clear , drainage- none, tonsils- atrophic.  Neck- flexible , trachea midline, no stridor , thyroid nl, carotid no bruit Chest - symmetrical excursion , unlabored           Heart/CV- RRR , no murmur , no gallop  , no rub, nl s1 s2                           - JVD-+ 1, edema + trace, stasis changes- none, varices- none           Lung- decreased breath sounds, clear, wheeze- none, cough + rattling ,                                             dullness-none, rub-none           Chest wall-  Abd-  Br/ Gen/ Rectal- Not done, not indicated Extrem- cyanosis- none, clubbing, none, atrophy- none, strength- nl.  Neuro- grossly intact to observation  Assessment & Plan:

## 2014-05-02 NOTE — Patient Instructions (Signed)
Script sent for augmentin  Samples Brovana (long acting bronchodilator) neb solution- 1 ampule in neb, every 8-12 hours as needed, instead of albuterol neb solution  Sample x 2 Stiolto inhaler  Try 2 puffs, once daily, instead of Anoro, for comparison   We may want next to try substituting Dulera 200 for your Symbicort 160

## 2014-05-03 NOTE — Assessment & Plan Note (Signed)
We're hoping better control of bronchitis will allow some reduction of her liter flow and maintain stable oxygen requirement.

## 2014-05-03 NOTE — Assessment & Plan Note (Signed)
Far advanced COPD with a bronchitis which has been slow to improve Plan-repeat Augmentin, try Brovana nebulizer solution for longer duration and try samples of Stiolto instead of Anoro. Discussed potential arrhythmia problems from heavy doses of adrenergic drugs. She is quite anxious to try.

## 2014-05-07 ENCOUNTER — Ambulatory Visit: Payer: Medicare HMO | Admitting: Interventional Cardiology

## 2014-05-08 ENCOUNTER — Ambulatory Visit: Payer: Medicare HMO | Admitting: Interventional Cardiology

## 2014-05-10 ENCOUNTER — Ambulatory Visit: Payer: Medicare HMO | Admitting: Interventional Cardiology

## 2014-05-30 ENCOUNTER — Telehealth: Payer: Self-pay

## 2014-05-30 NOTE — Telephone Encounter (Signed)
Pt received paperwork about medicare wellness;pt wants to know if needs fill out all those papers; advised pt if she can fill out paperwork and bring to appt on 06/24/14. Pt voiced understanding.

## 2014-06-11 ENCOUNTER — Other Ambulatory Visit: Payer: Self-pay | Admitting: Interventional Cardiology

## 2014-06-12 NOTE — Telephone Encounter (Signed)
Can you clarify what patients current dose should be? Thanks, MI

## 2014-06-13 ENCOUNTER — Other Ambulatory Visit: Payer: Self-pay | Admitting: Family Medicine

## 2014-06-13 DIAGNOSIS — E785 Hyperlipidemia, unspecified: Secondary | ICD-10-CM

## 2014-06-13 DIAGNOSIS — I10 Essential (primary) hypertension: Secondary | ICD-10-CM

## 2014-06-13 DIAGNOSIS — Z0000 Encounter for general adult medical examination without abnormal findings: Secondary | ICD-10-CM

## 2014-06-14 ENCOUNTER — Other Ambulatory Visit: Payer: Self-pay | Admitting: Family Medicine

## 2014-06-14 ENCOUNTER — Other Ambulatory Visit: Payer: Self-pay | Admitting: Interventional Cardiology

## 2014-06-14 NOTE — Telephone Encounter (Signed)
Rx called in to requested pharmacy 

## 2014-06-14 NOTE — Telephone Encounter (Signed)
Pt has not had any recent f/u appts but has upcoming CPE

## 2014-06-17 NOTE — Telephone Encounter (Signed)
Called pt to verify her dosage of losartan. Pt is taking losartan 50mg  qd

## 2014-06-19 ENCOUNTER — Other Ambulatory Visit (INDEPENDENT_AMBULATORY_CARE_PROVIDER_SITE_OTHER): Payer: Medicare HMO

## 2014-06-19 DIAGNOSIS — E785 Hyperlipidemia, unspecified: Secondary | ICD-10-CM

## 2014-06-19 DIAGNOSIS — Z0000 Encounter for general adult medical examination without abnormal findings: Secondary | ICD-10-CM

## 2014-06-19 LAB — LIPID PANEL
Cholesterol: 168 mg/dL (ref 0–200)
HDL: 64.1 mg/dL (ref >39.00)
LDL CALC: 80 mg/dL (ref 0–99)
NONHDL: 103.9
TRIGLYCERIDES: 119 mg/dL (ref 0.0–149.0)
Total CHOL/HDL Ratio: 3
VLDL: 23.8 mg/dL (ref 0.0–40.0)

## 2014-06-19 LAB — COMPREHENSIVE METABOLIC PANEL
ALT: 36 U/L — ABNORMAL HIGH (ref 0–35)
AST: 54 U/L — ABNORMAL HIGH (ref 0–37)
Albumin: 3.7 g/dL (ref 3.5–5.2)
Alkaline Phosphatase: 183 U/L — ABNORMAL HIGH (ref 39–117)
BUN: 18 mg/dL (ref 6–23)
CALCIUM: 9.1 mg/dL (ref 8.4–10.5)
CO2: 26 mEq/L (ref 19–32)
Chloride: 106 mEq/L (ref 96–112)
Creatinine, Ser: 0.84 mg/dL (ref 0.40–1.20)
GFR: 69.11 mL/min (ref >60.00)
GLUCOSE: 132 mg/dL — AB (ref 70–99)
Potassium: 4.4 mEq/L (ref 3.5–5.1)
Sodium: 137 mEq/L (ref 135–145)
TOTAL PROTEIN: 6.6 g/dL (ref 6.0–8.3)
Total Bilirubin: 2 mg/dL — ABNORMAL HIGH (ref 0.2–1.2)

## 2014-06-19 LAB — CBC WITH DIFFERENTIAL/PLATELET
Basophils Absolute: 0 10*3/uL (ref 0.0–0.1)
Basophils Relative: 0.2 % (ref 0.0–3.0)
EOS PCT: 1.8 % (ref 0.0–5.0)
Eosinophils Absolute: 0.1 10*3/uL (ref 0.0–0.7)
HEMATOCRIT: 32.6 % — AB (ref 36.0–46.0)
Hemoglobin: 11.1 g/dL — ABNORMAL LOW (ref 12.0–15.0)
LYMPHS ABS: 1 10*3/uL (ref 0.7–4.0)
LYMPHS PCT: 24.7 % (ref 12.0–46.0)
MCHC: 34.2 g/dL (ref 30.0–36.0)
MCV: 89.2 fl (ref 78.0–100.0)
Monocytes Absolute: 0.4 10*3/uL (ref 0.1–1.0)
Monocytes Relative: 8.9 % (ref 3.0–12.0)
NEUTROS PCT: 64.4 % (ref 43.0–77.0)
Neutro Abs: 2.7 10*3/uL (ref 1.4–7.7)
Platelets: 94 10*3/uL — ABNORMAL LOW (ref 150.0–400.0)
RBC: 3.65 Mil/uL — AB (ref 3.87–5.11)
RDW: 15.6 % — AB (ref 11.5–15.5)
WBC: 4.2 10*3/uL (ref 4.0–10.5)

## 2014-06-19 LAB — TSH: TSH: 3.53 u[IU]/mL (ref 0.35–4.50)

## 2014-06-24 ENCOUNTER — Encounter: Payer: Medicare HMO | Admitting: Family Medicine

## 2014-06-27 ENCOUNTER — Ambulatory Visit (INDEPENDENT_AMBULATORY_CARE_PROVIDER_SITE_OTHER): Payer: Medicare HMO | Admitting: Family Medicine

## 2014-06-27 ENCOUNTER — Encounter: Payer: Self-pay | Admitting: Family Medicine

## 2014-06-27 VITALS — BP 140/64 | HR 77 | Temp 97.4°F | Ht 63.0 in | Wt 148.0 lb

## 2014-06-27 DIAGNOSIS — Z0000 Encounter for general adult medical examination without abnormal findings: Secondary | ICD-10-CM

## 2014-06-27 DIAGNOSIS — E785 Hyperlipidemia, unspecified: Secondary | ICD-10-CM

## 2014-06-27 DIAGNOSIS — N182 Chronic kidney disease, stage 2 (mild): Secondary | ICD-10-CM

## 2014-06-27 DIAGNOSIS — D696 Thrombocytopenia, unspecified: Secondary | ICD-10-CM

## 2014-06-27 DIAGNOSIS — I10 Essential (primary) hypertension: Secondary | ICD-10-CM

## 2014-06-27 DIAGNOSIS — R739 Hyperglycemia, unspecified: Secondary | ICD-10-CM

## 2014-06-27 DIAGNOSIS — R945 Abnormal results of liver function studies: Secondary | ICD-10-CM

## 2014-06-27 DIAGNOSIS — R7989 Other specified abnormal findings of blood chemistry: Secondary | ICD-10-CM | POA: Insufficient documentation

## 2014-06-27 DIAGNOSIS — J438 Other emphysema: Secondary | ICD-10-CM

## 2014-06-27 DIAGNOSIS — K5090 Crohn's disease, unspecified, without complications: Secondary | ICD-10-CM

## 2014-06-27 LAB — HEMOGLOBIN A1C: Hgb A1c MFr Bld: 5.8 % (ref 4.6–6.5)

## 2014-06-27 NOTE — Progress Notes (Signed)
Pre visit review using our clinic review tool, if applicable. No additional management support is needed unless otherwise documented below in the visit note. 

## 2014-06-27 NOTE — Assessment & Plan Note (Signed)
Check a1c today- was likely not fasting.

## 2014-06-27 NOTE — Assessment & Plan Note (Signed)
She stopped taking statin and cholesterol at goal but I advised her to talk to Dr. Katrinka Blazing- he may want her to restart one given her h/o CAD. The patient indicates understanding of these issues and agrees with the plan.

## 2014-06-27 NOTE — Assessment & Plan Note (Signed)
The patients weight, height, BMI and visual acuity have been recorded in the chart I have made referrals, counseling and provided education to the patient based review of the above and I have provided the pt with a written personalized care plan for preventive services.  

## 2014-06-27 NOTE — Progress Notes (Signed)
81 yo pleasant female here for annual medicare wellness visit. I have personally reviewed the Medicare Annual Wellness questionnaire and have noted 1. The patient's medical and social history 2. Their use of alcohol, tobacco or illicit drugs 3. Their current medications and supplements 4. The patient's functional ability including ADL's, fall risks, home safety risks and hearing or visual             impairment. 5. Diet and physical activities 6. Evidence for depression or mood disorders  End of life wishes discussed and updated in Social History.  The roster of all physicians providing medical care to patient - is listed in the Snapshot section of the chart.   Influenza vaccine 01/01/14 Tetanus: 01/11/2012 Pneumovax: 11/242010 prevnar 13- 06/13/13 Zostovax: never- had severe case of shingles previously Mammogram: 03/19/2014 Colon Screening: 2006 Bone Density: 02/2013 Eye exam- 03/31/2014- [facility] Densit: no (partials)  Hyperglycemia- fasting blood sugar 132- she is not sure if she was truly fasting. Denies having increased thirst or urination but she has been tired- chronic issue.  Chronic anemia/thrombocytopenia- cirrhosis and splenic varices, unchanged.  No blood in urine or stool.  CAD- with diastolic congestive heart failure- followed by Garnette Scheuermann.  Stopped zocor in 02/2014 on her own due to muscle aches.  She will see Dr. Katrinka Blazing next month.  Lab Results  Component Value Date   CHOL 168 06/19/2014   HDL 64.10 06/19/2014   LDLCALC 80 06/19/2014   TRIG 119.0 06/19/2014   CHOLHDL 3 06/19/2014   HTN- has been well controlled.    Lab Results  Component Value Date   CREATININE 0.84 06/19/2014   Lab Results  Component Value Date   NA 137 06/19/2014   K 4.4 06/19/2014   CL 106 06/19/2014   CO2 26 06/19/2014  elevated liver function- stable Lab Results  Component Value Date   ALT 36* 06/19/2014   AST 54* 06/19/2014   ALKPHOS 183* 06/19/2014   BILITOT 2.0* 06/19/2014    Lab Results  Component Value Date   WBC 4.2 06/19/2014   HGB 11.1* 06/19/2014   HCT 32.6* 06/19/2014   MCV 89.2 06/19/2014   PLT 94.0* 06/19/2014     COPD- oxygen dependent.  On spiriva, symbicort and as needed albuterol.  Followed by Fannie Knee.   Last saw him on 05/02/14- note reviewed.  Was having another acute exacerbation, unfortunately and was given another round of Augmentin along with Brovana nebulizer samples of Siolto.  Seeing him again on Monday.  Post herpetic neuralgia- still affects her from time to time- left side of face and eye- takes a xanax when this occurs.  Past Medical History  Diagnosis Date  . Dyspnea   . Esophageal reflux   . CAD (coronary artery disease)   . Hypertension   . Crohn's disease   . Allergic rhinitis   . COPD (chronic obstructive pulmonary disease)   . History of kidney stones   . History of shingles   . Trigeminal neuralgia   . Family history of anesthesia complication     " SISTER HAD BAD FEELINGS "  . Anginal pain   . Anxiety     ' JUST FOR MY BREATHING "  . Arthritis     mild in hands  . Hyperlipidemia     Current Outpatient Prescriptions  Medication Sig Dispense Refill  . albuterol (PROVENTIL HFA;VENTOLIN HFA) 108 (90 BASE) MCG/ACT inhaler Inhale 2 puffs into the lungs daily as needed for wheezing or shortness of breath.    [name]  albuterol (PROVENTIL) (2.5 MG/3ML) 0.083% nebulizer solution Take 2.5 mg by nebulization every 4 (four) hours as needed. Shortness of breath    . ALPRAZolam (XANAX) 0.5 MG tablet TAKE ONE TABLET BY MOUTH THREE TIMES DAILY AS NEEDED FOR ANXIETY 270 tablet 0  . amoxicillin-clavulanate (AUGMENTIN) 875-125 MG per tablet Take 1 tablet by mouth 2 (two) times daily. 14 tablet 0  . Apple Cider Vinegar 500 MG TABS Take 1 tablet by mouth daily.    [name] aspirin 81 MG tablet Take 81 mg by mouth daily.    [name] BIOTIN 5000 PO Take 2 tablets by mouth daily.    . budesonide-formoterol (SYMBICORT) 160-4.5 MCG/ACT inhaler  Inhale 2 puffs into the lungs 2 (two) times daily. 1 Inhaler prn  . calcium carbonate (OS-CAL) 600 MG TABS tablet Take 600 mg by mouth 2 (two) times daily with a meal.    . Cholecalciferol (VITAMIN D-3) 5000 UNITS TABS Take 1 tablet by mouth daily.    . Coenzyme Q10 (CO Q 10 PO) Take 2 tablets by mouth daily.     . Cranberry 250 MG TABS Take 2 tablets by mouth.    . cyanocobalamin (,VITAMIN B-12,) 1000 MCG/ML injection Inject 1,000 mcg into the muscle every 30 (thirty) days.    [name] estradiol (ESTRACE) 0.5 MG tablet TAKE 1/2 TABLET BY MOUTH ONCE DAILY 45 tablet 1  . fish oil-omega-3 fatty acids 1000 MG capsule Take 1 g by mouth 2 (two) times daily.     . furosemide (LASIX) 20 MG tablet Take 20 mg by mouth daily.    . isosorbide mononitrate (IMDUR) 30 MG 24 hr tablet TAKE TWO (2) TABLETS BY MOUTH DAILY 60 tablet 1  . loratadine (CLARITIN) 10 MG tablet Take 10 mg by mouth daily.    [name] losartan (COZAAR) 50 MG tablet TAKE 1 TABLET BY MOUTH DAILY 30 tablet 5  . magnesium oxide (MAG-OX) 400 MG tablet Take 400 mg by mouth daily.     . Multiple Vitamin (MULTIVITAMIN) tablet Take 1 tablet by mouth daily.     . nitroGLYCERIN (NITROSTAT) 0.4 MG SL tablet Place 1 tablet (0.4 mg total) under the tongue every 5 (five) minutes as needed. Chest pain 25 tablet 3  . omeprazole (PRILOSEC) 20 MG capsule Take 1 capsule (20 mg total) by mouth daily. 90 capsule 1  . potassium chloride SA (K-DUR,KLOR-CON) 20 MEQ tablet Take 1 tablet (20 mEq total) by mouth 2 (two) times daily. Alternate taking 2 one day then 1 the next 180 tablet 1  . Tiotropium Bromide-Olodaterol (STIOLTO RESPIMAT) 2.5-2.5 MCG/ACT AERS Inhale 2 puffs into the lungs daily. 2 Inhaler 0  . Umeclidinium-Vilanterol (ANORO ELLIPTA) 62.5-25 MCG/INH AEPB Inhale 1 puff into the lungs daily. 1 each 0  . vitamin C (ASCORBIC ACID) 500 MG tablet Take 500 mg by mouth daily.    . vitamin E 1000 UNIT capsule Take 1,000 Units by mouth daily.    . Zinc Acetate, Oral, (ZINC  ACETATE PO) Take 1 tablet by mouth daily.     No current facility-administered medications for this visit.    Allergies  Allergen Reactions  . Avelox [Moxifloxacin Hcl In Nacl] Shortness Of Breath, Swelling and Rash  . Theophyllines     Slight increased heart rate  . Ciprofloxacin   . Codeine Other (See Comments)    Couldn't breath  . Milk-Related Compounds Other (See Comments)    Hurts stomach.. Whole milk, not all dairy products  . Lisinopril Rash  Family History  Problem Relation Age of Onset  . Breast cancer Sister   . COPD Sister   . Stroke Mother   . Prostate cancer Father   . Prostate cancer Brother   . Heart attack Brother     History   Social History  . Marital Status: Widowed    Spouse Name: N/A  . Number of Children: N/A  . Years of Education: N/A   Occupational History  . PT [employer]    Social History Main Topics  . Smoking status: Former Smoker -- 1.00 packs/day for 30 years    Types: Cigarettes    Quit date: 04/12/1990  . Smokeless tobacco: Never Used  . Alcohol Use: No  . Drug Use: No  . Sexual Activity: No   Other Topics Concern  . Not on file   Social History Narrative   Has a living will and HPOA- Blimi Godby.   Would desire CPR.   Would not want prolonged life support, would not want feeding tubes.          ROS: Review of Systems  Constitutional: Positive for fatigue. Negative for fever.  HENT: Negative.   Respiratory: Positive for shortness of breath. Negative for cough.   Cardiovascular: Negative.   Gastrointestinal: Negative.   Endocrine: Negative.   Genitourinary: Negative.   Musculoskeletal: Negative.   Skin: Negative.   Allergic/Immunologic: Negative.   Neurological: Negative.   Hematological: Negative.   Psychiatric/Behavioral: Negative.   All other systems reviewed and are negative.    PE: BP 140/64 mmHg  Pulse 77  Temp(Src) 97.4 F (36.3 C) (Oral)  Ht  (1.6 m)  Wt 148 lb (67.132 kg)  BMI 26.22  kg/m2  SpO2 98%  Wt Readings from Last 3 Encounters:  06/27/14 148 lb (67.132 kg)  05/02/14 147 lb 3.2 oz (66.769 kg)  04/10/14 146 lb 4 oz (66.339 kg)      General:  Well-developed,well-nourished,in no acute distress; alert,appropriate and cooperative throughout examination Head:  normocephalic and atraumatic.   Eyes:  vision grossly intact, pupils equal, pupils round, and pupils reactive to light.   Ears:  R ear normal and L ear normal.   Nose:  no external deformity.   Mouth:  good dentition.   Neck:  No deformities, masses, or tenderness noted. Lungs:  Normal respiratory effort, chest expands symmetrically. Lungs are clear to auscultation, no crackles or wheezes. Heart:  Normal rate and regular rhythm. S1 and S2 normal without gallop, murmur, click, rub or other extra sounds. Abdomen:  Bowel sounds positive,abdomen soft and non-tender without masses, organomegaly or hernias noted. Msk:  No deformity or scoliosis noted of thoracic or lumbar spine.   Extremities:  No clubbing, cyanosis, edema, or deformity noted with normal full range of motion of all joints.   Neurologic:  alert & oriented X3 and gait normal.   Skin:  Intact without suspicious lesions or rashes Cervical Nodes:  No lymphadenopathy noted Axillary Nodes:  No palpable lymphadenopathy Psych:  Cognition and judgment appear intact. Alert and cooperative with normal attention span and concentration. No apparent delusions, illusions, hallucinations

## 2014-06-27 NOTE — Patient Instructions (Signed)
Good to see you. I will call you with your lab results.   

## 2014-06-27 NOTE — Assessment & Plan Note (Signed)
Reasonable control. Managed by cardiology.

## 2014-06-28 ENCOUNTER — Encounter: Payer: Self-pay | Admitting: Family Medicine

## 2014-07-01 ENCOUNTER — Ambulatory Visit (INDEPENDENT_AMBULATORY_CARE_PROVIDER_SITE_OTHER): Payer: Medicare HMO | Admitting: Internal Medicine

## 2014-07-01 ENCOUNTER — Encounter: Payer: Self-pay | Admitting: Internal Medicine

## 2014-07-01 VITALS — BP 128/58 | HR 82 | Ht 63.0 in | Wt 147.8 lb

## 2014-07-01 DIAGNOSIS — J432 Centrilobular emphysema: Secondary | ICD-10-CM

## 2014-07-01 DIAGNOSIS — J449 Chronic obstructive pulmonary disease, unspecified: Secondary | ICD-10-CM

## 2014-07-01 DIAGNOSIS — K219 Gastro-esophageal reflux disease without esophagitis: Secondary | ICD-10-CM | POA: Diagnosis not present

## 2014-07-01 MED ORDER — UMECLIDINIUM-VILANTEROL 62.5-25 MCG/INH IN AEPB: 1.0000 | INHALATION_SPRAY | Freq: Every day | RESPIRATORY_TRACT | Status: DC

## 2014-07-01 MED ORDER — ARFORMOTEROL TARTRATE 15 MCG/2ML IN NEBU: 15.0000 ug | INHALATION_SOLUTION | Freq: Two times a day (BID) | RESPIRATORY_TRACT | Status: DC

## 2014-07-01 MED ORDER — GABAPENTIN 100 MG PO CAPS: 100.0000 mg | ORAL_CAPSULE | Freq: Two times a day (BID) | ORAL | Status: DC

## 2014-07-01 NOTE — Progress Notes (Signed)
Subjective:    Patient ID: Brandi Cline, female    DOB: 01/04/1931, 81 y.o.   MRN: [id number] Acute visit- Dr Shelle Iron 09/28/11- The patient comes in today for an acute sick visit.  She has known significant COPD, and is usually followed by Dr. [name].  She gives a 2 to three-day history of increasing shortness of breath, and this culminated in severe shortness of breath this morning.  She is currently a little better after using her morning bronchodilators and her neb treatment.  She has no significant cough, mucus, or congestion.  She does feel a chest heaviness, especially on the left side.  She has a history of coronary disease with stents, but thinks that chest discomfort is different from her usual angina.  Surprisingly, her oxygen saturations today are excellent.  12/06/11 - 80 yoF followed for COPD, complicated by allergic rhinitis, GERD, Crohns Disease, CAD/ stents Patient states had pneumonia/ hosp/ heart cath( ok w/ no new stents) in June. States breathing is a little worse since last office visit. States wears oxygen on 2L as needed. c/o sob, wheezing, and chest tightness every now and then. Denies chest pain and cough. Occasional scant clear mucus. Denies chest pain. Notices dyspnea mainly with exertion, worse in humid weather. CXR 10/08/11-reviewed with her IMPRESSION:  COPD/chronic changes. No active disease.  Original Report Authenticated By: [name], M.D.   03/17/12- 80 yoF followed for COPD, complicated by allergic rhinitis, GERD, Crohns Disease, CAD/ stents FOLLOWS FOR: feels like symibcort and Spiriva not helping anymore-would like something different and cheaper Sister here 2 URI's since August treated by PCP. Needed prednisone.  Feels Symbicort wear off before 12 hour next dose. Wearing O2 more and using neb twice daily.  Little cough- scant clear mucus. Aware of angina despite isorbide. Feet swell- varies, not new or progressive. CXR 10/07/11- COPD  04/27/12-  81 yoF  followed for COPD, complicated by allergic rhinitis, GERD, Crohns Disease, CAD/ stents FOLLOWS FOR: Increased SOB today-unsure of cause(used nebulizer before OV-not much  difference), did not hear any wheezing. Denies any cough or chest congestion Often feels tight at night. Aware of increased fluid retention occasionally. Denies chest pain, infection, wheeze or cough. Last visit we gave sample Breo ellipta, which she blames for fluid retention. Continues oxygen 2 L/[facility] for sleep and as needed  12/19/12- 81 yoF former smoker followed for COPD, complicated by allergic rhinitis, GERD, Crohns Disease, CAD/ stents     Family here. ACUTE:  Increased SOB, tightness that travels around to back and some wheezing.  Sore throat in am and  scratchy all day Continues oxygen 3 L/[facility] for sleep and as needed. She has been more dyspneic over past year. We reviewed Chest CT from the spring, noting it was done because of dyspnea then. Hosp 3/26-07/12/12 for AECOPD with NAD on CXR.  Now 3-4 weeks variable hoarseness- on magic mouthwash and prednisone taper. Some difficulty swallowing.  No cough, scant deep yellow sputum, some tight. No pain, blood, fever, palpitation. Easy DOE needing 3LO2 rest, 4-5L exertion in home. CT chest 07/05/12 IMPRESSION:  . Stable hyperinflation and chronic interstitial prominence. Again  noted left basilar scarring. No acute infiltrate or pulmonary  edema.  Original Report Authenticated By: [name], M.D. CXR 07/10/12 IMPRESSION:  No acute cardiopulmonary abnormality seen. Chronic findings as  described above.  Original Report Authenticated By: [name]., M.D.  01/30/13- 62 yoF former smoker followed for COPD, complicated by allergic rhinitis, GERD, Crohns Disease, CAD/ stents  Family here. FOLLOWS FOR: "weak as water"; swelling in feet, staying tired. Good and bad days Weight gain 10 pounds in the past month. Ankles swell. Little cough, scant yellow. Pain mid  thoracic spine. Continues O2 4L/ [facility]. Theophylline was no help and caused palpitations, so quit. Last prednisone 2 weeks ago. ENT/Dr. [name] saw her in September and diagnosed esophageal reflux and thrush. He doubled her acid blocker.  03/20/13- 58 yoF former smoker followed for COPD, complicated by allergic rhinitis, GERD, Crohns Disease, CAD/ stents   Daughter here FOLLOWS FOR: Breathing is unchanged. Reports SOB and coughing. Denies chest tightness or wheezing.   Dr. Kevan Ny is treating with Lasix. She is off of prednisone. Morning cough with clear mucus. Using Flutter device for pulmonary toilet. Medications discussed. Hosp for AECOPD 05/13/12- 05/15/12 CXR 01/30/13 IMPRESSION:  Bibasilar linear opacities, consistent with atelectasis or scarring.  Emphysematous changes.  Electronically Signed  By: [name] M.D.  On: 01/30/2013 16:54  07/03/13- 13 yoF former smoker followed for COPD, complicated by allergic rhinitis, GERD, Crohns Disease, CAD/ stents   Daughter here FOLLOWS FOR: Symbicort works well for patient about 4-5 hours and then uses rescue inhaler; using neb treatments as needed; but unsure if Spiriva is actually helping. Wonders if she can try [name] again as she has had past 2 days without any side effects. Now finishing prednisone and Z-Pak from primary physician. Denies any benefit from [facility]. Using either nebulizer or Proair once or twice daily.  09/18/13- 33 yoF former smoker followed for COPD, complicated by allergic rhinitis, GERD, Crohns Disease, CAD/ stents   Daughter here FOLLOWS FOR: Pt states breathing has worsened since last OV. Pt states the theophyillne isnt helping. Pt states she has an increase in SOB. C/o mild productive cough with clear mucus and chest tightness with deep breaths.   10/30/13- 82 yoF former smoker followed for COPD/ E, complicated by allergic rhinitis, GERD, Crohns Disease, CAD/ stents   Daughter here FOLLOWS FOR: Using up to 5l/m cont  at home; having a hard time breathing-heat gets her worse.  CT chest 09/19/13 IMPRESSION:  No evidence of significant pulmonary embolus. Extensive  emphysematous changes in the lungs.  Electronically Signed  By: Burman Nieves M.D.  On: 09/19/2013 22:10  01/01/14- 82 yoF former smoker followed for COPD/ E, complicated by allergic rhinitis, GERD, Crohns Disease, CAD/ stents   Daughter here FOLLOWS FOR: continues ot use 5L/M cont at home and 3L/m pulse when out; DME is [facility]. Could tell that Anoro helped better than Symbicort. Anoro is not on her Formulary list through insurance. Never really cleared the cough and yellow sputum after bronchitis in December 05/02/14- 83 yoF former smoker followed for COPD/ E, complicated by allergic rhinitis, GERD, Crohns Disease, CAD/ stents   Daughter here  FOLLOWS FOR: has noticed she has to wear O2 more during the day at 5.5L now.  Pt would like to discuss other options for meds as she feels the Anoro  and albuterol is no longer holding her breathing stable enough.  Asks help for a tier reduction on Symbicort. I suggested we try alternatives first. She wants medications that last longer between doses and feels her heart rhythm is controlled.. She wanted to try Symbicort 3 times daily. CXR 04/10/14 IMPRESSION: 1. Acute cardiopulmonary disease.- should read "NO acute pulmonary disease  -CDY 2. Pleural parenchymal scarring . Electronically Signed  By: Maisie Fus Register  On: 04/10/2014 11:53  07/01/14- 89 yoF former smoker followed for COPD/ E, complicated by  allergic rhinitis, GERD, Crohns Disease, CAD/ stents   Daughter here FOLLOW FOR:  COPD; lot of tightness in chest, coughing up a lot of clear mucus, sometimes yellow.  having to use rescue inhaler daily Hard cough x 3-4 days. No fever.  ROS-see HPI Constitutional:   No-   weight loss, night sweats, fevers, chills, fatigue, lassitude. HEENT:   No-  headaches, +difficulty swallowing, tooth/dental problems,  +sore throat,       No-  sneezing, itching, ear ache, nasal congestion, post nasal drip,  CV:  No-   chest pain, orthopnea, PND, +swelling in lower extremities, anasarca, dizziness, palpitations Resp: +  shortness of breath with exertion or at rest.             +productive cough, no- non-productive cough,  No- coughing up of blood.              No- change in color of mucus.  No- wheezing.   Skin: No-rash or lesions. GI:  + heartburn, indigestion, no-abdominal pain, nausea, vomiting,  GU:  MS:  No-   joint pain or swelling.  + back pain Neuro-     nothing unusual Psych:  No- change in mood or affect. No depression or anxiety.  No memory loss.  Objective:  OBJ- Physical Exam General- Alert, Oriented, Affect-appropriate, Distress- none acute, wheelchair, O2 3L Skin- rash-none, lesions- none, excoriation- none Lymphadenopathy- none Head- atraumatic            Eyes- Gross vision intact, PERRLA, conjunctivae and secretions clear            Ears- +Hearing aids            Nose- Clear, no-Septal dev, mucus, polyps, erosion, perforation             Throat- Mallampati II , mucosa clear , drainage- none, tonsils- atrophic.  Neck- flexible , trachea midline, no stridor , thyroid nl, carotid no bruit Chest - symmetrical excursion , unlabored           Heart/CV- RRR , no murmur , no gallop  , no rub, nl s1 s2                           - JVD-+ 1, edema + trace, stasis changes- none, varices- none           Lung- decreased breath sounds, clear, wheeze- none,                   cough + dry ,  dullness-none, rub-none           Chest wall-  Abd-  Br/ Gen/ Rectal- Not done, not indicated Extrem- cyanosis- none, clubbing, none, atrophy- none, strength- nl.  Neuro- grossly intact to observation  Assessment & Plan:

## 2014-07-01 NOTE — Patient Instructions (Signed)
Order- Scripted Brovana neb solution to go through [facility]/ DME pharmacy for Part B  Order- DME [facility]- change portable to M6 tanks to use with back pack 3L/ min dx COPD mixed type  Stiolto removed from drug list. Anoro script sent  Symbicort removed- not helpful  Script sent for Gabapentin to see if it will help the chronic cough

## 2014-07-02 ENCOUNTER — Telehealth: Payer: Self-pay | Admitting: Internal Medicine

## 2014-07-02 NOTE — Telephone Encounter (Signed)
Called and spoke to pt. Pt questioning why CY scripted Gabapentin. Informed pt CY rx'd for the med for chronic cough. Pt verbalized understanding and denied any further questions or concerns at this time.   Patient Instructions     Order- Scripted Brovana neb solution to go through [facility]/ DME pharmacy for Part B  Order- DME [facility]- change portable to M6 tanks to use with back pack 3L/ min dx COPD mixed type  Stiolto removed from drug list. Anoro script sent  Symbicort removed- not helpful  Script sent for Gabapentin to see if it will help the chronic cough

## 2014-07-03 ENCOUNTER — Telehealth: Payer: Self-pay | Admitting: Family Medicine

## 2014-07-03 NOTE — Telephone Encounter (Signed)
Result was release to mychart. Called patient. Notified patient of the A1C results and of Dr Elmer Sow comments. Patient verbalized understanding.

## 2014-07-03 NOTE — Telephone Encounter (Signed)
Pt states she had a lab test done for diabetes a week ago.  She said she has been trying to get these results for a couple of days.  Please call pt with results.

## 2014-07-04 ENCOUNTER — Other Ambulatory Visit: Payer: Self-pay | Admitting: Internal Medicine

## 2014-07-04 MED ORDER — ALBUTEROL SULFATE (2.5 MG/3ML) 0.083% IN NEBU: 2.5000 mg | INHALATION_SOLUTION | RESPIRATORY_TRACT | Status: DC | PRN

## 2014-07-09 ENCOUNTER — Telehealth: Payer: Self-pay | Admitting: Internal Medicine

## 2014-07-09 DIAGNOSIS — J441 Chronic obstructive pulmonary disease with (acute) exacerbation: Secondary | ICD-10-CM

## 2014-07-09 DIAGNOSIS — J449 Chronic obstructive pulmonary disease, unspecified: Secondary | ICD-10-CM

## 2014-07-09 NOTE — Telephone Encounter (Signed)
Spoke with pt. She has recently injured her shoulder and can't tolerate the oxygen being on her shoulder. Needs a cart for oxygen. Order has been placed. Nothing further was needed.

## 2014-07-15 ENCOUNTER — Telehealth: Payer: Self-pay | Admitting: Internal Medicine

## 2014-07-15 ENCOUNTER — Other Ambulatory Visit: Payer: Self-pay | Admitting: Family Medicine

## 2014-07-15 NOTE — Telephone Encounter (Signed)
Last filled 06/14/14, last ov was a cpx on 06/27/14.

## 2014-07-15 NOTE — Telephone Encounter (Signed)
This is an FYI for CY and see if there is anything else [facility] can do. Will forward to CY.

## 2014-07-15 NOTE — Telephone Encounter (Signed)
Noted  

## 2014-07-16 ENCOUNTER — Telehealth: Payer: Self-pay | Admitting: Internal Medicine

## 2014-07-16 MED ORDER — TIOTROPIUM BROMIDE-OLODATEROL 2.5-2.5 MCG/ACT IN AERS: 2.0000 | INHALATION_SPRAY | Freq: Every day | RESPIRATORY_TRACT | Status: DC

## 2014-07-16 NOTE — Telephone Encounter (Signed)
Spoke with patient, states that she has albuterol nebulizer meds and Brovana meds - wanted to know how she is to take these. Advised the per med list she is take the Brovana BID and albuterol is to be taken PRN ( can take every 4 hours - up to 6 x per day). Pt expressed understanding.   Pt states that the Anoro is not working as expected- is not controlling breathing symptoms. States that it helped in the beginning but it slowly stopped working after about 2 weeks.  Still using 6L/min O2 24/7 - pt reports having to turn O2 up to 6.5L/min with increased breathing issues.   Please advise Dr [name]. Thanks.  Allergies  Allergen Reactions  . Avelox [Moxifloxacin Hcl In Nacl] Shortness Of Breath, Swelling and Rash  . Theophyllines     Slight increased heart rate  . Ciprofloxacin   . Codeine Other (See Comments)    Couldn't breath  . Milk-Related Compounds Other (See Comments)    Hurts stomach.. Whole milk, not all dairy products  . Lisinopril Rash      Medication List       This list is accurate as of: 07/16/14 12:31 PM.  Always use your most recent med list.               albuterol 108 (90 BASE) MCG/ACT inhaler  Commonly known as:  PROVENTIL HFA;VENTOLIN HFA  Inhale 2 puffs into the lungs daily as needed for wheezing or shortness of breath.     albuterol (2.5 MG/3ML) 0.083% nebulizer solution  Commonly known as:  PROVENTIL  Take 3 mLs (2.5 mg total) by nebulization every 4 (four) hours as needed. Shortness of breath     ALPRAZolam 0.5 MG tablet  Commonly known as:  XANAX  TAKE ONE TABLET BY MOUTH THREE TIMES DAILY AS NEEDED FOR ANXIETY     Apple Cider Vinegar 500 MG Tabs  Take 1 tablet by mouth daily.     arformoterol 15 MCG/2ML Nebu  Commonly known as:  BROVANA  Take 2 mLs (15 mcg total) by nebulization 2 (two) times daily.     aspirin 81 MG tablet  Take 81 mg by mouth daily.     BIOTIN 5000 PO  Take 2 tablets by mouth daily.     calcium carbonate 600 MG Tabs tablet   Commonly known as:  OS-CAL  Take 600 mg by mouth 2 (two) times daily with a meal.     CO Q 10 PO  Take 2 tablets by mouth daily.     Cranberry 250 MG Tabs  Take 2 tablets by mouth.     cyanocobalamin 1000 MCG/ML injection  Commonly known as:  (VITAMIN B-12)  Inject 1,000 mcg into the muscle every 30 (thirty) days.     fish oil-omega-3 fatty acids 1000 MG capsule  Take 1 g by mouth 2 (two) times daily.     furosemide 20 MG tablet  Commonly known as:  LASIX  Take 20 mg by mouth daily.     gabapentin 100 MG capsule  Commonly known as:  NEURONTIN  Take 1 capsule (100 mg total) by mouth 2 (two) times daily.     isosorbide mononitrate 30 MG 24 hr tablet  Commonly known as:  IMDUR  TAKE TWO (2) TABLETS BY MOUTH DAILY     loratadine 10 MG tablet  Commonly known as:  CLARITIN  Take 10 mg by mouth daily.     losartan 50 MG tablet  Commonly known as:  COZAAR  TAKE 1 TABLET BY MOUTH DAILY     magnesium oxide 400 MG tablet  Commonly known as:  MAG-OX  Take 400 mg by mouth daily.     multivitamin tablet  Take 1 tablet by mouth daily.     nitroGLYCERIN 0.4 MG SL tablet  Commonly known as:  NITROSTAT  Place 1 tablet (0.4 mg total) under the tongue every 5 (five) minutes as needed. Chest pain     omeprazole 20 MG capsule  Commonly known as:  PRILOSEC  Take 1 capsule (20 mg total) by mouth daily.     potassium chloride SA 20 MEQ tablet  Commonly known as:  K-DUR,KLOR-CON  Take 1 tablet (20 mEq total) by mouth 2 (two) times daily. Alternate taking 2 one day then 1 the next     Umeclidinium-Vilanterol 62.5-25 MCG/INH Aepb  Commonly known as:  ANORO ELLIPTA  Inhale 1 puff into the lungs daily.     vitamin C 500 MG tablet  Commonly known as:  ASCORBIC ACID  Take 500 mg by mouth daily.     Vitamin D-3 5000 UNITS Tabs  Take 1 tablet by mouth daily.     vitamin E 1000 UNIT capsule  Take 1,000 Units by mouth daily.     ZINC ACETATE PO  Take 1 tablet by mouth daily.

## 2014-07-16 NOTE — Telephone Encounter (Signed)
Stiolto placed up front for pick up.  Pt states that she has used this before and would like another sample to ensure that it will work well for her again.  Nothing further needed.

## 2014-07-16 NOTE — Telephone Encounter (Signed)
rx called into pharmacy

## 2014-07-16 NOTE — Telephone Encounter (Signed)
Suggest replace Anoro sample with Stiolto Respimat sample   2 puffs, once daily       Perhaps a family member could come to pick up and get instructed in device use.

## 2014-07-17 ENCOUNTER — Telehealth: Payer: Self-pay | Admitting: Internal Medicine

## 2014-07-17 ENCOUNTER — Ambulatory Visit (INDEPENDENT_AMBULATORY_CARE_PROVIDER_SITE_OTHER): Payer: Medicare HMO | Admitting: Interventional Cardiology

## 2014-07-17 ENCOUNTER — Encounter: Payer: Self-pay | Admitting: Interventional Cardiology

## 2014-07-17 VITALS — BP 128/58 | HR 80 | Ht 63.0 in | Wt 149.0 lb

## 2014-07-17 DIAGNOSIS — I5032 Chronic diastolic (congestive) heart failure: Secondary | ICD-10-CM

## 2014-07-17 DIAGNOSIS — I10 Essential (primary) hypertension: Secondary | ICD-10-CM | POA: Diagnosis not present

## 2014-07-17 DIAGNOSIS — J430 Unilateral pulmonary emphysema [MacLeod's syndrome]: Secondary | ICD-10-CM | POA: Diagnosis not present

## 2014-07-17 DIAGNOSIS — I2510 Atherosclerotic heart disease of native coronary artery without angina pectoris: Secondary | ICD-10-CM

## 2014-07-17 MED ORDER — LOSARTAN POTASSIUM 50 MG PO TABS: 50.0000 mg | ORAL_TABLET | Freq: Every day | ORAL | Status: DC

## 2014-07-17 NOTE — Patient Instructions (Signed)
Your physician recommends that you continue on your current medications as directed. Please refer to the Current Medication list given to you today.  Your physician wants you to follow-up in: 1 year with Dr.Smith You will receive a reminder letter in the mail two months in advance. If you don't receive a letter, please call our office to schedule the follow-up appointment.  

## 2014-07-17 NOTE — Telephone Encounter (Signed)
Spoke with pt, states that the Stiolto that pt was given a sample of is not covered by her insurance.  She also noted to me that the Anoro did not help her at all.  She brought in her formulary, and I made a copy of all respiratory meds.    Dr. [name] please advise on which alternative you recommend for pt.  I've attached pt's formulary to this note on your desk.    Allergies  Allergen Reactions  . Avelox [Moxifloxacin Hcl In Nacl] Shortness Of Breath, Swelling and Rash  . Theophyllines     Slight increased heart rate  . Ciprofloxacin   . Codeine Other (See Comments)    Couldn't breath  . Milk-Related Compounds Other (See Comments)    Hurts stomach.. Whole milk, not all dairy products  . Lisinopril Rash

## 2014-07-17 NOTE — Progress Notes (Signed)
Cardiology Office Note   Date:  07/17/2014   ID:  Brandi Cline, [place] [birth year], MRN [id number]  PCP:  [name], MD  Cardiologist:   Lesleigh Noe, MD   Chief Complaint  Patient presents with  . Follow-up    Annual      History of Present Illness: Brandi Cline is a 81 y.o. female who presents for follow-up of coronary artery disease. She has not had angina and denies nitroglycerin use. She denies syncope. No prolonged palpitations. She does have lower extremity swelling. Now on continuous oxygen.    Past Medical History  Diagnosis Date  . Dyspnea   . Esophageal reflux   . CAD (coronary artery disease)   . Hypertension   . Crohn's disease   . Allergic rhinitis   . COPD (chronic obstructive pulmonary disease)   . History of kidney stones   . History of shingles   . Trigeminal neuralgia   . Family history of anesthesia complication     " SISTER HAD BAD FEELINGS "  . Anginal pain   . Anxiety     ' JUST FOR MY BREATHING "  . Arthritis     mild in hands  . Hyperlipidemia     Past Surgical History  Procedure Laterality Date  . Coronary stents    . Lithotripsy    . Ureteral stent placement    . Cholecystectomy    . Total abdominal hysterectomy    . Partial resections large and small bowel for crohn's    . Breast lumps benign    . Left heart catheterization with coronary angiogram N/A 10/28/2011    Procedure: LEFT HEART CATHETERIZATION WITH CORONARY ANGIOGRAM;  Surgeon: Lesleigh Noe, MD;  Location: [facility] CATH LAB;  Service: Cardiovascular;  Laterality: N/A;     Current Outpatient Prescriptions  Medication Sig Dispense Refill  . albuterol (PROVENTIL HFA;VENTOLIN HFA) 108 (90 BASE) MCG/ACT inhaler Inhale 2 puffs into the lungs daily as needed for wheezing or shortness of breath.    [name] albuterol (PROVENTIL) (2.5 MG/3ML) 0.083% nebulizer solution Take 3 mLs (2.5 mg total) by nebulization every 4 (four) hours as needed. Shortness of breath 75 mL 2  .  ALPRAZolam (XANAX) 0.5 MG tablet TAKE ONE TABLET BY MOUTH THREE TIMES DAILY AS NEEDED FOR ANXIETY 270 tablet 0  . Apple Cider Vinegar 500 MG TABS Take 1 tablet by mouth daily.    [name] arformoterol (BROVANA) 15 MCG/2ML NEBU Take 2 mLs (15 mcg total) by nebulization 2 (two) times daily. 120 mL 6  . aspirin 81 MG tablet Take 81 mg by mouth daily.    [name] BIOTIN 5000 PO Take 2 tablets by mouth daily.    . Cholecalciferol (VITAMIN D-3) 5000 UNITS TABS Take 1 tablet by mouth daily.    . Coenzyme Q10 (CO Q 10 PO) Take 2 tablets by mouth daily.     . Cranberry 250 MG TABS Take 2 tablets by mouth.    . cyanocobalamin (,VITAMIN B-12,) 1000 MCG/ML injection Inject 1,000 mcg into the muscle every 30 (thirty) days.    . fish oil-omega-3 fatty acids 1000 MG capsule Take 1 g by mouth 2 (two) times daily.     . furosemide (LASIX) 20 MG tablet Take 20 mg by mouth daily.    . isosorbide mononitrate (IMDUR) 30 MG 24 hr tablet TAKE TWO (2) TABLETS BY MOUTH DAILY 60 tablet 1  . loratadine (CLARITIN) 10 MG tablet Take 10 mg  by mouth daily as needed.     [name] losartan (COZAAR) 50 MG tablet TAKE 1 TABLET BY MOUTH DAILY 30 tablet 5  . magnesium oxide (MAG-OX) 400 MG tablet Take 400 mg by mouth daily.     . Multiple Vitamin (MULTIVITAMIN) tablet Take 1 tablet by mouth daily.     . nitroGLYCERIN (NITROSTAT) 0.4 MG SL tablet Place 1 tablet (0.4 mg total) under the tongue every 5 (five) minutes as needed. Chest pain 25 tablet 3  . omeprazole (PRILOSEC) 20 MG capsule Take 1 capsule (20 mg total) by mouth daily. 90 capsule 1  . potassium chloride SA (K-DUR,KLOR-CON) 20 MEQ tablet Take 1 tablet (20 mEq total) by mouth 2 (two) times daily. Alternate taking 2 one day then 1 the next 180 tablet 1  . vitamin C (ASCORBIC ACID) 500 MG tablet Take 500 mg by mouth daily.    . vitamin E 1000 UNIT capsule Take 1,000 Units by mouth daily.    . Zinc Acetate, Oral, (ZINC ACETATE PO) Take 1 tablet by mouth daily.     No current  facility-administered medications for this visit.    Allergies:   Avelox; Theophyllines; Ciprofloxacin; Codeine; Milk-related compounds; and Lisinopril    Social History:  The patient  reports that she quit smoking about 24 years ago. Her smoking use included Cigarettes. She has a 30 pack-year smoking history. She has never used smokeless tobacco. She reports that she does not drink alcohol or use illicit drugs.   Family History:  The patient's family history includes Breast cancer in her sister; COPD in her sister; Heart attack in her brother; Prostate cancer in her brother and father; Stroke in her mother.    ROS:  Please see the history of present illness.   Otherwise, review of systems are positive for progression of COPD. Continuous oxygen therapy. Weakness and fatigue is prevalent..   All other systems are reviewed and negative.    PHYSICAL EXAM: VS:  BP 128/58 mmHg  Pulse 80  Ht 5\' 3"  (1.6 m)  Wt 149 lb (67.586 kg)  BMI 26.40 kg/m2  SpO2 93% , BMI Body mass index is 26.4 kg/(m^2). GEN: Well nourished, well developed, in no acute distress HEENT: normal Neck: no JVD, carotid bruits, or masses Cardiac: RRR; no murmurs, rubs, or gallops,no edema  Respiratory:  clear to auscultation bilaterally, normal work of breathing GI: soft, nontender, nondistended, + BS MS: no deformity or atrophy Skin: warm and dry, no rash Neuro:  Strength and sensation are intact Psych: euthymic mood, full affect   EKG:  EKG is not ordered today.    Recent Labs: 09/19/2013: Magnesium 1.1*; Pro B Natriuretic peptide (BNP) 163.3 06/19/2014: ALT 36*; BUN 18; Creatinine 0.84; Hemoglobin 11.1*; Platelets 94.0*; Potassium 4.4; Sodium 137; TSH 3.53    Lipid Panel    Component Value Date/Time   CHOL 168 06/19/2014 1120   TRIG 119.0 06/19/2014 1120   HDL 64.10 06/19/2014 1120   CHOLHDL 3 06/19/2014 1120   VLDL 23.8 06/19/2014 1120   LDLCALC 80 06/19/2014 1120      Wt Readings from Last 3  Encounters:  07/17/14 149 lb (67.586 kg)  07/01/14 147 lb 12.8 oz (67.042 kg)  06/27/14 148 lb (67.132 kg)      Other studies Reviewed: Additional studies/ records that were reviewed today include: None.   ASSESSMENT AND PLAN:   Chronic diastolic congestive heart failure, NYHA class 1: No evidence of significant volume overload  Atherosclerosis of native coronary  artery of native heart without angina pectoris: Asymptomatic  Severe end-stage emphysema: Severe with O2 requirement  Essential hypertension: Well-controlled     Current medicines are reviewed at length with the patient today.  The patient does not have concerns regarding medicines.  The following changes have been made:  no change  Labs/ tests ordered today include:  No orders of the defined types were placed in this encounter.     Disposition:   FU with Mendel Ryder in 12  months   Signed, Lesleigh Noe, MD  07/17/2014 1:54 PM    [facility] [address] [place], [place], [place]  [address] Phone: [phone]; Fax: [phone]

## 2014-07-19 MED ORDER — ALBUTEROL SULFATE 2 MG PO TABS: 2.0000 mg | ORAL_TABLET | Freq: Two times a day (BID) | ORAL | Status: DC | PRN

## 2014-07-19 NOTE — Telephone Encounter (Signed)
Pt returned call - [phone]

## 2014-07-19 NOTE — Telephone Encounter (Signed)
Patient notified.  rx sent to [facility].  Nothing further needed.

## 2014-07-19 NOTE — Telephone Encounter (Signed)
See if she could try albuterol 2 mg tablets, # 30, 1 twice daily if needed. Many drugstores can't get this, but I understand [facility] can get it.

## 2014-07-19 NOTE — Telephone Encounter (Signed)
Left message to call back  

## 2014-08-06 ENCOUNTER — Other Ambulatory Visit: Payer: Self-pay | Admitting: Family Medicine

## 2014-08-12 ENCOUNTER — Other Ambulatory Visit: Payer: Self-pay | Admitting: Interventional Cardiology

## 2014-08-13 ENCOUNTER — Telehealth: Payer: Self-pay | Admitting: Internal Medicine

## 2014-08-13 DIAGNOSIS — J430 Unilateral pulmonary emphysema [MacLeod's syndrome]: Secondary | ICD-10-CM

## 2014-08-13 MED ORDER — ARFORMOTEROL TARTRATE 15 MCG/2ML IN NEBU: 15.0000 ug | INHALATION_SOLUTION | Freq: Two times a day (BID) | RESPIRATORY_TRACT | Status: DC

## 2014-08-13 NOTE — Telephone Encounter (Signed)
Either type of neb medicine might cause chest pain, especially if taken too close together. Ok to Rx [employer] solution, # 60, 1 twice daily as needed, ref prn, through [facility]

## 2014-08-13 NOTE — Telephone Encounter (Signed)
Called spoke with pt. She reports she takes brovana BID and has been on this x 2 months. She also has albuterol neb for PRN. She reports she is not sure if it is the brovana or albuterol that has caused her to have CP at times. She feels the brovana works well for her. She wants to start getting this from [facility] if CDY feels she should stay on this. Please advise Dr. [name] thanks  Allergies  Allergen Reactions  . Avelox [Moxifloxacin Hcl In Nacl] Shortness Of Breath, Swelling and Rash  . Theophyllines     Slight increased heart rate  . Ciprofloxacin   . Codeine Other (See Comments)    Couldn't breath  . Milk-Related Compounds Other (See Comments)    Hurts stomach.. Whole milk, not all dairy products  . Lisinopril Rash     Current Outpatient Prescriptions on File Prior to Visit  Medication Sig Dispense Refill  . albuterol (PROVENTIL HFA;VENTOLIN HFA) 108 (90 BASE) MCG/ACT inhaler Inhale 2 puffs into the lungs daily as needed for wheezing or shortness of breath.    [name] albuterol (PROVENTIL) (2.5 MG/3ML) 0.083% nebulizer solution Take 3 mLs (2.5 mg total) by nebulization every 4 (four) hours as needed. Shortness of breath 75 mL 2  . albuterol (PROVENTIL) 2 MG tablet Take 1 tablet (2 mg total) by mouth 2 (two) times daily as needed for wheezing. 60 tablet 0  . ALPRAZolam (XANAX) 0.5 MG tablet TAKE ONE TABLET BY MOUTH THREE TIMES DAILY AS NEEDED FOR ANXIETY 270 tablet 0  . Apple Cider Vinegar 500 MG TABS Take 1 tablet by mouth daily.    [name] arformoterol (BROVANA) 15 MCG/2ML NEBU Take 2 mLs (15 mcg total) by nebulization 2 (two) times daily. 120 mL 6  . aspirin 81 MG tablet Take 81 mg by mouth daily.    [name] BIOTIN 5000 PO Take 2 tablets by mouth daily.    . Cholecalciferol (VITAMIN D-3) 5000 UNITS TABS Take 1 tablet by mouth daily.    . Coenzyme Q10 (CO Q 10 PO) Take 2 tablets by mouth daily.     . Cranberry 250 MG TABS Take 2 tablets by mouth.    . cyanocobalamin (,VITAMIN B-12,) 1000 MCG/ML  injection INJECT 1 ML IM ONCE A MONTH 3 mL 1  . fish oil-omega-3 fatty acids 1000 MG capsule Take 1 g by mouth 2 (two) times daily.     . furosemide (LASIX) 20 MG tablet Take 20 mg by mouth daily.    . isosorbide mononitrate (IMDUR) 30 MG 24 hr tablet TAKE TWO (2) TABLETS BY MOUTH DAILY 60 tablet 1  . loratadine (CLARITIN) 10 MG tablet Take 10 mg by mouth daily as needed.     [name] losartan (COZAAR) 50 MG tablet Take 1 tablet (50 mg total) by mouth daily. 30 tablet 11  . magnesium oxide (MAG-OX) 400 MG tablet Take 400 mg by mouth daily.     . Multiple Vitamin (MULTIVITAMIN) tablet Take 1 tablet by mouth daily.     . nitroGLYCERIN (NITROSTAT) 0.4 MG SL tablet Place 1 tablet (0.4 mg total) under the tongue every 5 (five) minutes as needed. Chest pain 25 tablet 3  . omeprazole (PRILOSEC) 20 MG capsule Take 1 capsule (20 mg total) by mouth daily. 90 capsule 1  . potassium chloride SA (K-DUR,KLOR-CON) 20 MEQ tablet Take 1 tablet (20 mEq total) by mouth 2 (two) times daily. Alternate taking 2 one day then 1 the next 180 tablet 1  .  vitamin C (ASCORBIC ACID) 500 MG tablet Take 500 mg by mouth daily.    . vitamin E 1000 UNIT capsule Take 1,000 Units by mouth daily.    . Zinc Acetate, Oral, (ZINC ACETATE PO) Take 1 tablet by mouth daily.     No current facility-administered medications on file prior to visit.

## 2014-08-13 NOTE — Telephone Encounter (Signed)
Pt aware of below.  RX printed and placed on CY's cart to sign.  Forwarding to Katie to follow up on.

## 2014-08-14 MED ORDER — ARFORMOTEROL TARTRATE 15 MCG/2ML IN NEBU: 15.0000 ug | INHALATION_SOLUTION | Freq: Two times a day (BID) | RESPIRATORY_TRACT | Status: AC

## 2014-08-14 NOTE — Telephone Encounter (Signed)
Nothing further needed 

## 2014-08-14 NOTE — Telephone Encounter (Signed)
Order and Rx has been placed.

## 2014-08-14 NOTE — Telephone Encounter (Signed)
Brandi Cline, has this been taken care of? thanks

## 2014-08-14 NOTE — Assessment & Plan Note (Signed)
Acute exacerbation of her COPD Plan- Gabapentin for chronic cough supresion, remove symbicort, script for Brovana through DME, retry Anoro, remove [employer]

## 2014-08-14 NOTE — Assessment & Plan Note (Signed)
Discussed reflux and aspiration as potential triggers of her cough exacerbation Plan- emphasize reflux precautions

## 2014-08-15 ENCOUNTER — Telehealth: Payer: Self-pay | Admitting: Internal Medicine

## 2014-08-15 DIAGNOSIS — J441 Chronic obstructive pulmonary disease with (acute) exacerbation: Secondary | ICD-10-CM

## 2014-08-15 NOTE — Telephone Encounter (Signed)
Patient wants everything switched from [facility] to [facility].  Her Nebs and Oxygen. Order entered for change in DME.

## 2014-09-02 ENCOUNTER — Ambulatory Visit (INDEPENDENT_AMBULATORY_CARE_PROVIDER_SITE_OTHER): Payer: Medicare HMO | Admitting: Internal Medicine

## 2014-09-02 ENCOUNTER — Telehealth: Payer: Self-pay | Admitting: Internal Medicine

## 2014-09-02 ENCOUNTER — Other Ambulatory Visit: Payer: Self-pay | Admitting: Family Medicine

## 2014-09-02 VITALS — BP 120/66 | HR 76 | Ht 63.0 in | Wt 149.0 lb

## 2014-09-02 DIAGNOSIS — J432 Centrilobular emphysema: Secondary | ICD-10-CM

## 2014-09-02 DIAGNOSIS — I5032 Chronic diastolic (congestive) heart failure: Secondary | ICD-10-CM | POA: Diagnosis not present

## 2014-09-02 DIAGNOSIS — J9611 Chronic respiratory failure with hypoxia: Secondary | ICD-10-CM | POA: Diagnosis not present

## 2014-09-02 MED ORDER — ARFORMOTEROL TARTRATE 15 MCG/2ML IN NEBU: 15.0000 ug | INHALATION_SOLUTION | Freq: Two times a day (BID) | RESPIRATORY_TRACT | Status: DC

## 2014-09-02 MED ORDER — TIOTROPIUM BROMIDE MONOHYDRATE 2.5 MCG/ACT IN AERS: 2.0000 | INHALATION_SPRAY | Freq: Every day | RESPIRATORY_TRACT | Status: DC

## 2014-09-02 NOTE — Patient Instructions (Addendum)
Samples x 4 of either Brovana or Perforomist if availbale   1 by neb twice daily      This is to last until your [facility] supply comes in  Sample Spiriva Respimat    Inhale 2 puffs, once daily  You can decide when you want to take it, to cover the mid-afternoon and evening.  Let's keep the June 23 appointment for now. Please call if needed

## 2014-09-02 NOTE — Telephone Encounter (Signed)
Advised pt that this can be discussed with CY today when she comes in for her appointment. She agreed and verbalized understanding. Nothing further was needed.

## 2014-09-02 NOTE — Telephone Encounter (Signed)
Pt returning call.Brandi Cline ° °

## 2014-09-02 NOTE — Progress Notes (Signed)
Subjective:    Patient ID: Brandi Cline, female    DOB: 10-09-1930, 81 y.o.   MRN: [id number] Acute visit- Dr Shelle Iron 09/28/11- The patient comes in today for an acute sick visit.  She has known significant COPD, and is usually followed by Dr. [name].  She gives a 2 to three-day history of increasing shortness of breath, and this culminated in severe shortness of breath this morning.  She is currently a little better after using her morning bronchodilators and her neb treatment.  She has no significant cough, mucus, or congestion.  She does feel a chest heaviness, especially on the left side.  She has a history of coronary disease with stents, but thinks that chest discomfort is different from her usual angina.  Surprisingly, her oxygen saturations today are excellent.  12/06/11 - 80 yoF followed for COPD, complicated by allergic rhinitis, GERD, Crohns Disease, CAD/ stents Patient states had pneumonia/ hosp/ heart cath( ok w/ no new stents) in June. States breathing is a little worse since last office visit. States wears oxygen on 2L as needed. c/o sob, wheezing, and chest tightness every now and then. Denies chest pain and cough. Occasional scant clear mucus. Denies chest pain. Notices dyspnea mainly with exertion, worse in humid weather. CXR 10/08/11-reviewed with her IMPRESSION:  COPD/chronic changes. No active disease.  Original Report Authenticated By: [name], M.D.   03/17/12- 80 yoF followed for COPD, complicated by allergic rhinitis, GERD, Crohns Disease, CAD/ stents FOLLOWS FOR: feels like symibcort and Spiriva not helping anymore-would like something different and cheaper Sister here 2 URI's since August treated by PCP. Needed prednisone.  Feels Symbicort wear off before 12 hour next dose. Wearing O2 more and using neb twice daily.  Little cough- scant clear mucus. Aware of angina despite isorbide. Feet swell- varies, not new or progressive. CXR 10/07/11- COPD  04/27/12-  81 yoF  followed for COPD, complicated by allergic rhinitis, GERD, Crohns Disease, CAD/ stents FOLLOWS FOR: Increased SOB today-unsure of cause(used nebulizer before OV-not much  difference), did not hear any wheezing. Denies any cough or chest congestion Often feels tight at night. Aware of increased fluid retention occasionally. Denies chest pain, infection, wheeze or cough. Last visit we gave sample Breo ellipta, which she blames for fluid retention. Continues oxygen 2 L/Advanced for sleep and as needed  12/19/12- 81 yoF former smoker followed for COPD, complicated by allergic rhinitis, GERD, Crohns Disease, CAD/ stents     Family here. ACUTE:  Increased SOB, tightness that travels around to back and some wheezing.  Sore throat in am and  scratchy all day Continues oxygen 3 L/Advanced for sleep and as needed. She has been more dyspneic over past year. We reviewed Chest CT from the spring, noting it was done because of dyspnea then. Hosp 3/26-07/12/12 for AECOPD with NAD on CXR.  Now 3-4 weeks variable hoarseness- on magic mouthwash and prednisone taper. Some difficulty swallowing.  No cough, scant deep yellow sputum, some tight. No pain, blood, fever, palpitation. Easy DOE needing 3LO2 rest, 4-5L exertion in home. CT chest 07/05/12 IMPRESSION:  . Stable hyperinflation and chronic interstitial prominence. Again  noted left basilar scarring. No acute infiltrate or pulmonary  edema.  Original Report Authenticated By: [name], M.D. CXR 07/10/12 IMPRESSION:  No acute cardiopulmonary abnormality seen. Chronic findings as  described above.  Original Report Authenticated By: [name]., M.D.  01/30/13- 28 yoF former smoker followed for COPD, complicated by allergic rhinitis, GERD, Crohns Disease, CAD/ stents  Family here. FOLLOWS FOR: "weak as water"; swelling in feet, staying tired. Good and bad days Weight gain 10 pounds in the past month. Ankles swell. Little cough, scant yellow. Pain mid  thoracic spine. Continues O2 4L/ Advanced. Theophylline was no help and caused palpitations, so quit. Last prednisone 2 weeks ago. ENT/Dr. [name] saw her in September and diagnosed esophageal reflux and thrush. He doubled her acid blocker.  03/20/13- 53 yoF former smoker followed for COPD, complicated by allergic rhinitis, GERD, Crohns Disease, CAD/ stents   Daughter here FOLLOWS FOR: Breathing is unchanged. Reports SOB and coughing. Denies chest tightness or wheezing.   Dr. Kevan Ny is treating with Lasix. She is off of prednisone. Morning cough with clear mucus. Using Flutter device for pulmonary toilet. Medications discussed. Hosp for AECOPD 05/13/12- 05/15/12 CXR 01/30/13 IMPRESSION:  Bibasilar linear opacities, consistent with atelectasis or scarring.  Emphysematous changes.  Electronically Signed  By: [name] M.D.  On: 01/30/2013 16:54  07/03/13- 41 yoF former smoker followed for COPD, complicated by allergic rhinitis, GERD, Crohns Disease, CAD/ stents   Daughter here FOLLOWS FOR: Symbicort works well for patient about 4-5 hours and then uses rescue inhaler; using neb treatments as needed; but unsure if Spiriva is actually helping. Wonders if she can try [name] again as she has had past 2 days without any side effects. Now finishing prednisone and Z-Pak from primary physician. Denies any benefit from [facility]. Using either nebulizer or Proair once or twice daily.  09/18/13- 48 yoF former smoker followed for COPD, complicated by allergic rhinitis, GERD, Crohns Disease, CAD/ stents   Daughter here FOLLOWS FOR: Pt states breathing has worsened since last OV. Pt states the theophyillne isnt helping. Pt states she has an increase in SOB. C/o mild productive cough with clear mucus and chest tightness with deep breaths.   10/30/13- 82 yoF former smoker followed for COPD/ E, complicated by allergic rhinitis, GERD, Crohns Disease, CAD/ stents   Daughter here FOLLOWS FOR: Using up to 5l/m cont  at home; having a hard time breathing-heat gets her worse.  CT chest 09/19/13 IMPRESSION:  No evidence of significant pulmonary embolus. Extensive  emphysematous changes in the lungs.  Electronically Signed  By: Burman Nieves M.D.  On: 09/19/2013 22:10  01/01/14- 82 yoF former smoker followed for COPD/ E, complicated by allergic rhinitis, GERD, Crohns Disease, CAD/ stents   Daughter here FOLLOWS FOR: continues ot use 5L/M cont at home and 3L/m pulse when out; DME is [facility]. Could tell that Anoro helped better than Symbicort. Anoro is not on her Formulary list through insurance. Never really cleared the cough and yellow sputum after bronchitis in December 05/02/14- 83 yoF former smoker followed for COPD/ E, complicated by allergic rhinitis, GERD, Crohns Disease, CAD/ stents   Daughter here  FOLLOWS FOR: has noticed she has to wear O2 more during the day at 5.5L now.  Pt would like to discuss other options for meds as she feels the Anoro  and albuterol is no longer holding her breathing stable enough.  Asks help for a tier reduction on Symbicort. I suggested we try alternatives first. She wants medications that last longer between doses and feels her heart rhythm is controlled.. She wanted to try Symbicort 3 times daily. CXR 04/10/14 IMPRESSION: 1. Acute cardiopulmonary disease.- should read "NO acute pulmonary disease  -CDY 2. Pleural parenchymal scarring . Electronically Signed  By: Maisie Fus Register  On: 04/10/2014 11:53  07/01/14- 72 yoF former smoker followed for COPD/ E, complicated by  allergic rhinitis, GERD, Crohns Disease, CAD/ stents   Daughter here FOLLOW FOR:  COPD; lot of tightness in chest, coughing up a lot of clear mucus, sometimes yellow.  having to use rescue inhaler daily Hard cough x 3-4 days. No fever.  09/02/14- 74 yoF former smoker followed for COPD/ E, complicated by allergic rhinitis, GERD, Crohns Disease, CAD/ stents   Daughter here Reports: medication refill and wants  to stick with the Brovana. Prescription order is coming through [place]. Also has an Anoro inhaler which I gave in an effort to stabilize her bronchodilator control around-the-clock. She has had 2 episodes that sound like self-limited palpitations, which she attributes to Anoro. Neither albuterol rescue inhaler or nebulizer with albuterol helped her. She does use Flutter and Incentive Spirometer daily. O2 6L [facility] with home concentrator, 3 L portable.  ROS-see HPI Constitutional:   No-   weight loss, night sweats, fevers, chills, fatigue, lassitude. HEENT:   No-  headaches, +difficulty swallowing, tooth/dental problems, +sore throat,       No-  sneezing, itching, ear ache, nasal congestion, post nasal drip,  CV:  No-   chest pain, orthopnea, PND, +swelling in lower extremities, anasarca, dizziness, +palpitations Resp: +  shortness of breath with exertion or at rest.             +productive cough, no- non-productive cough,  No- coughing up of blood.              No- change in color of mucus.  No- wheezing.   Skin: No-rash or lesions. GI:  + heartburn, indigestion, no-abdominal pain, nausea, vomiting,  GU:  MS:  No-   joint pain or swelling.  + back pain Neuro-     nothing unusual Psych:  No- change in mood or affect. No depression or anxiety.  No memory loss.  Objective:  OBJ- Physical Exam General- Alert, Oriented, Affect-appropriate, Distress- none acute, wheelchair, +O2 3L portable here Skin- rash-none, lesions- none, excoriation- none Lymphadenopathy- none Head- atraumatic            Eyes- Gross vision intact, PERRLA, conjunctivae and secretions clear            Ears- +Hearing aids            Nose- Clear, no-Septal dev, mucus, polyps, erosion, perforation             Throat- Mallampati II , mucosa clear , drainage- none, tonsils- atrophic.  Neck- flexible , trachea midline, no stridor , thyroid nl, carotid no bruit Chest - symmetrical excursion , unlabored           Heart/CV- RRR ,  no murmur , no gallop  , no rub, nl s1 s2                           - JVD-+ 1, edema + trace, stasis changes- none, varices- none           Lung- decreased breath sounds, clear, wheeze- none,                   cough + dry ,  dullness-none, rub-none           Chest wall-  Abd-  Br/ Gen/ Rectal- Not done, not indicated Extrem- cyanosis- none, clubbing, none, atrophy- none, strength- nl.  Neuro- grossly intact to observation  Assessment & Plan:

## 2014-09-02 NOTE — Telephone Encounter (Signed)
atc pt, line rang for over 90 seconds with no voicemail or answer.  Wcb.

## 2014-09-03 ENCOUNTER — Other Ambulatory Visit: Payer: Self-pay | Admitting: Family Medicine

## 2014-09-03 ENCOUNTER — Encounter: Payer: Self-pay | Admitting: Internal Medicine

## 2014-09-03 DIAGNOSIS — J9611 Chronic respiratory failure with hypoxia: Secondary | ICD-10-CM | POA: Insufficient documentation

## 2014-09-03 NOTE — Assessment & Plan Note (Signed)
Uses oxygen 6 L with home concentrator machine and longer hose, 3 L with portable oxygen

## 2014-09-03 NOTE — Assessment & Plan Note (Signed)
She had a couple of episodes of palpitations when we added Anoro to twice daily Brovana Plan she will continue with Brovana and we can give a few sample Nebules to last until her current order comes in. She will try Spiriva Respimat sample. Then we can consider adding ipratropium to her nebulizer med.

## 2014-09-03 NOTE — Assessment & Plan Note (Signed)
Increased pedal edema recently. I gave her permission to take an extra Lasix occasionally but to discuss with her cardiologist.

## 2014-09-07 ENCOUNTER — Other Ambulatory Visit: Payer: Self-pay | Admitting: Family Medicine

## 2014-09-07 ENCOUNTER — Encounter: Payer: Self-pay | Admitting: Family Medicine

## 2014-09-07 ENCOUNTER — Ambulatory Visit (INDEPENDENT_AMBULATORY_CARE_PROVIDER_SITE_OTHER): Payer: Medicare HMO | Admitting: Family Medicine

## 2014-09-07 VITALS — BP 150/62 | Temp 97.6°F | Wt 147.0 lb

## 2014-09-07 DIAGNOSIS — N3090 Cystitis, unspecified without hematuria: Secondary | ICD-10-CM | POA: Diagnosis not present

## 2014-09-07 DIAGNOSIS — R300 Dysuria: Secondary | ICD-10-CM

## 2014-09-07 LAB — POCT URINALYSIS DIPSTICK
Bilirubin, UA: NEGATIVE
Blood, UA: NEGATIVE
Glucose, UA: NEGATIVE
Nitrite, UA: NEGATIVE
PROTEIN UA: POSITIVE
Spec Grav, UA: 1.025
Urobilinogen, UA: NEGATIVE
pH, UA: 5

## 2014-09-07 MED ORDER — CEPHALEXIN 250 MG PO CAPS: 250.0000 mg | ORAL_CAPSULE | Freq: Two times a day (BID) | ORAL | Status: DC

## 2014-09-07 MED ORDER — CEPHALEXIN 500 MG PO CAPS: 500.0000 mg | ORAL_CAPSULE | Freq: Two times a day (BID) | ORAL | Status: DC

## 2014-09-07 NOTE — Patient Instructions (Signed)
Drink plenty of water and start the antibiotics today.  We'll contact you with your lab report.  Take care.   

## 2014-09-07 NOTE — Progress Notes (Signed)
Dysuria: yes, burning and frequency.   duration of symptoms: today abdominal pain: no fevers:no back pain: some lower back pain for about 2 days Vomiting:no  Meds, vitals, and allergies reviewed.   ROS: See HPI.  Otherwise negative.    GEN: nad, alert and oriented, on O2 at baseline.   HEENT: mucous membranes moist NECK: supple CV: rrr.  PULM: ctab, no inc wob ABD: soft, +bs, suprapubic area not tender EXT: trace edema SKIN: no acute rash BACK: no CVA pain

## 2014-09-07 NOTE — Assessment & Plan Note (Addendum)
Nontoxic, okay to use keflex. Check ucx, see AVS.  D/w pt.  Initial rx for 250mg  cancelled, resent 500mg  rx and notified pharmacy by phone.

## 2014-09-08 LAB — URINE CULTURE
COLONY COUNT: NO GROWTH
Organism ID, Bacteria: NO GROWTH

## 2014-09-10 ENCOUNTER — Telehealth: Payer: Self-pay

## 2014-09-10 NOTE — Telephone Encounter (Signed)
Brandi Cline pts daughter left v/m that they are on their way to [facility] and Brandi Cline said Keflex is not helping UTI; pt was seen [facility] and Brandi Cline thought she was to cb and ask for stronger abx if pt did not respond; Brandi Cline request med sent to [facility].Please advise.

## 2014-09-10 NOTE — Telephone Encounter (Signed)
Left detailed message on voicemail of call back number for [facility].  Camelia Eng will check on the culture results tomorrow if they have not resulted.

## 2014-09-10 NOTE — Telephone Encounter (Signed)
Pt seen on 09/07/14 at [facility].

## 2014-09-10 NOTE — Telephone Encounter (Signed)
PLEASE NOTE: All timestamps contained within this report are represented as Eastern Standard Time. CONFIDENTIALTY NOTICE: This fax transmission is intended only for the addressee. It contains information that is legally privileged, confidential or otherwise protected from use or disclosure. If you are not the intended recipient, you are strictly prohibited from reviewing, disclosing, copying using or disseminating any of this information or taking any action in reliance on or regarding this information. If you have received this fax in error, please notify us immediately by telephone so that we can arrange for its return to us. Phone: [phone], Toll-Free: [phone], Fax: [phone] Page: 1 of 2 Call Id: [id number] [facility] Night - Client TELEPHONE ADVICE RECORD [facility] Patient Name: Brandi Cline Gender: Female DOB: 11/13/1930 Age: 81 Y 6 M 19 D Return Phone Number: [phone] (Primary) Address: City/State/Zip: [place] Client [facility] Night - Client Client Site [facility] - Night Physician Aron, Talia Contact Type Call Call Type Triage / Clinical Relationship To Patient Self Return Phone Number [phone] (Primary) Chief Complaint Urination Pain Initial Comment Caller states she has UTI symptoms PreDisposition Call Doctor Nurse Assessment Nurse: Wisdom, RN, Susie Date/Time (Eastern Time): 09/07/2014 8:43:07 AM Confirm and document reason for call. If symptomatic, describe symptoms. ---Caller states she has UTI symptoms; urgency feeling and urinating small amount, and pain in small of back; no fever; Has the patient traveled out of the country within the last 30 days? ---Not Applicable Does the patient require triage? ---Yes Related visit to physician within the last 2 weeks? ---N/A Does the PT have any chronic conditions? (i.e. diabetes, asthma, etc.) ---Yes List chronic  conditions. ---COPD; Angina; Crohns Disease Guidelines Guideline Title Affirmed Question Affirmed Notes Nurse Date/Time (Eastern Time) Urination Pain - Female Side (flank) or lower back pain present Wisdom, RN, Susie 09/07/2014 8:45:49 AM Disp. Time (Eastern Time) Disposition Final User 09/07/2014 8:50:08 AM See Physician within 4 Hours (or PCP triage) Yes Wisdom, RN, Susie Caller Understands: Yes Disagree/Comply: Comply Care Advice Given Per Guideline SEE PHYSICIAN WITHIN 4 HOURS (or PCP triage): PAIN MEDICINES: CALL BACK IF: * You become worse. CARE ADVICE given per Urination Pain - Female (Adult) guideline. PLEASE NOTE: All timestamps contained within this report are represented as Eastern Standard Time. CONFIDENTIALTY NOTICE: This fax transmission is intended only for the addressee. It contains information that is legally privileged, confidential or otherwise protected from use or disclosure. If you are not the intended recipient, you are strictly prohibited from reviewing, disclosing, copying using or disseminating any of this information or taking any action in reliance on or regarding this information. If you have received this fax in error, please notify us immediately by telephone so that we can arrange for its return to us. Phone: [phone], Toll-Free: [phone], Fax: [phone] Page: 2 of 2 Call Id: [id number] After Care Instructions Given Call Event Type User Date / Time Description Comments User: Susie, Wisdom, RN Date/Time (Eastern Time): 09/07/2014 8:51:49 AM Caller not given care advice for pain medications - stated she was waiting to see what she would be told to do - and that she tries to avoid those things; Referrals [facility] 

## 2014-09-10 NOTE — Telephone Encounter (Signed)
Please check on ucx results.  Tell her to use AZO for now (OTC).  We can try to change the abx when ucx resulted, which should be soon.  Thanks.

## 2014-09-11 MED ORDER — SULFAMETHOXAZOLE-TRIMETHOPRIM 800-160 MG PO TABS: 1.0000 | ORAL_TABLET | Freq: Two times a day (BID) | ORAL | Status: DC

## 2014-09-11 NOTE — Telephone Encounter (Signed)
Spoke to Brandi Cline and was advised that she will make more phone calls to track the culture results down.  Patient's daughter notified as instructed by telephone and verbalized understanding. Patient's daughter Dois Davenport) stated that the patient's normal temperature is 96.7 and this morning it was 98.0 which she considers a low grade fever. While on the phone with Dois Davenport she rechecked her mom's temperature and it was 97.7. Dois Davenport stated that she will go ahead and pick the new script up and will have her start it as soon as she gets.

## 2014-09-11 NOTE — Telephone Encounter (Signed)
Spoke to Brandi Cline in our lab and was advised that she called the main lab about the urine culture and was advised that a urine culture was not done. Brandi Cline stated that a cultured was ordered, but not released which means that a urine culture was not done. See previous notes.

## 2014-09-11 NOTE — Telephone Encounter (Signed)
Patient's daughter called back.  Patient has been taking AZO and Keflex and she still has the low back pain and urinary burning. Patient has a low grade fever.  Patient's daughter is worried that if she doesn't get something by this afternoon she may have to go to the hospital.  Patient's daughter [phone].  Patient's daughter is asking if you can't get through on her cell phone to text her a message.

## 2014-09-11 NOTE — Telephone Encounter (Signed)
The culture was collected and sent.  It was taken over to the lab at [facility] by clinic staff.  I'm asking Brandi Cline to check with them about the results.   I sent the new rx to the pharmacy.   I don't know what she means by low grade fever.   If decompensating or temp >100.4, then to ER.  If neither then proceed with new rx.   Thanks.

## 2014-09-11 NOTE — Telephone Encounter (Signed)
Noted. Thanks.

## 2014-10-03 ENCOUNTER — Encounter: Payer: Self-pay | Admitting: Internal Medicine

## 2014-10-03 ENCOUNTER — Ambulatory Visit (INDEPENDENT_AMBULATORY_CARE_PROVIDER_SITE_OTHER): Payer: Medicare HMO | Admitting: Internal Medicine

## 2014-10-03 ENCOUNTER — Other Ambulatory Visit (INDEPENDENT_AMBULATORY_CARE_PROVIDER_SITE_OTHER): Payer: Medicare HMO

## 2014-10-03 VITALS — BP 118/70 | HR 73 | Ht 63.0 in | Wt 148.0 lb

## 2014-10-03 DIAGNOSIS — R0609 Other forms of dyspnea: Secondary | ICD-10-CM

## 2014-10-03 DIAGNOSIS — D696 Thrombocytopenia, unspecified: Secondary | ICD-10-CM

## 2014-10-03 DIAGNOSIS — R7989 Other specified abnormal findings of blood chemistry: Secondary | ICD-10-CM | POA: Diagnosis not present

## 2014-10-03 DIAGNOSIS — J432 Centrilobular emphysema: Secondary | ICD-10-CM | POA: Diagnosis not present

## 2014-10-03 DIAGNOSIS — R609 Edema, unspecified: Secondary | ICD-10-CM | POA: Diagnosis not present

## 2014-10-03 DIAGNOSIS — J9611 Chronic respiratory failure with hypoxia: Secondary | ICD-10-CM

## 2014-10-03 DIAGNOSIS — R945 Abnormal results of liver function studies: Secondary | ICD-10-CM

## 2014-10-03 LAB — CBC WITH DIFFERENTIAL/PLATELET
BASOS ABS: 0 10*3/uL (ref 0.0–0.1)
BASOS PCT: 0.3 % (ref 0.0–3.0)
Eosinophils Absolute: 0.1 10*3/uL (ref 0.0–0.7)
Eosinophils Relative: 2.3 % (ref 0.0–5.0)
HCT: 31.4 % — ABNORMAL LOW (ref 36.0–46.0)
Hemoglobin: 10.3 g/dL — ABNORMAL LOW (ref 12.0–15.0)
LYMPHS PCT: 21.4 % (ref 12.0–46.0)
Lymphs Abs: 0.9 10*3/uL (ref 0.7–4.0)
MCHC: 32.8 g/dL (ref 30.0–36.0)
MCV: 92 fl (ref 78.0–100.0)
Monocytes Absolute: 0.4 10*3/uL (ref 0.1–1.0)
Monocytes Relative: 9.4 % (ref 3.0–12.0)
NEUTROS PCT: 66.6 % (ref 43.0–77.0)
Neutro Abs: 2.8 10*3/uL (ref 1.4–7.7)
Platelets: 71 10*3/uL — ABNORMAL LOW (ref 150.0–400.0)
RBC: 3.41 Mil/uL — AB (ref 3.87–5.11)
RDW: 16.7 % — ABNORMAL HIGH (ref 11.5–15.5)
WBC: 4.2 10*3/uL (ref 4.0–10.5)

## 2014-10-03 LAB — COMPREHENSIVE METABOLIC PANEL
ALT: 29 U/L (ref 0–35)
AST: 40 U/L — AB (ref 0–37)
Albumin: 3.6 g/dL (ref 3.5–5.2)
Alkaline Phosphatase: 190 U/L — ABNORMAL HIGH (ref 39–117)
BUN: 32 mg/dL — AB (ref 6–23)
CO2: 16 mEq/L — ABNORMAL LOW (ref 19–32)
CREATININE: 2.02 mg/dL — AB (ref 0.40–1.20)
Calcium: 8.6 mg/dL (ref 8.4–10.5)
Chloride: 114 mEq/L — ABNORMAL HIGH (ref 96–112)
GFR: 25.09 mL/min — ABNORMAL LOW (ref >60.00)
Glucose, Bld: 116 mg/dL — ABNORMAL HIGH (ref 70–99)
Potassium: 4.4 mEq/L (ref 3.5–5.1)
Sodium: 138 mEq/L (ref 135–145)
Total Bilirubin: 1.5 mg/dL — ABNORMAL HIGH (ref 0.2–1.2)
Total Protein: 6.7 g/dL (ref 6.0–8.3)

## 2014-10-03 MED ORDER — ALBUTEROL SULFATE (2.5 MG/3ML) 0.083% IN NEBU: 2.5000 mg | INHALATION_SOLUTION | RESPIRATORY_TRACT | Status: AC | PRN

## 2014-10-03 NOTE — Progress Notes (Signed)
Subjective:    Patient ID: Brandi Cline, female    DOB: 10-09-1930, 81 y.o.   MRN: [id number] Acute visit- Dr Shelle Iron 09/28/11- The patient comes in today for an acute sick visit.  She has known significant COPD, and is usually followed by Dr. [name].  She gives a 2 to three-day history of increasing shortness of breath, and this culminated in severe shortness of breath this morning.  She is currently a little better after using her morning bronchodilators and her neb treatment.  She has no significant cough, mucus, or congestion.  She does feel a chest heaviness, especially on the left side.  She has a history of coronary disease with stents, but thinks that chest discomfort is different from her usual angina.  Surprisingly, her oxygen saturations today are excellent.  12/06/11 - 80 yoF followed for COPD, complicated by allergic rhinitis, GERD, Crohns Disease, CAD/ stents Patient states had pneumonia/ hosp/ heart cath( ok w/ no new stents) in June. States breathing is a little worse since last office visit. States wears oxygen on 2L as needed. c/o sob, wheezing, and chest tightness every now and then. Denies chest pain and cough. Occasional scant clear mucus. Denies chest pain. Notices dyspnea mainly with exertion, worse in humid weather. CXR 10/08/11-reviewed with her IMPRESSION:  COPD/chronic changes. No active disease.  Original Report Authenticated By: [name], M.D.   03/17/12- 80 yoF followed for COPD, complicated by allergic rhinitis, GERD, Crohns Disease, CAD/ stents FOLLOWS FOR: feels like symibcort and Spiriva not helping anymore-would like something different and cheaper Sister here 2 URI's since August treated by PCP. Needed prednisone.  Feels Symbicort wear off before 12 hour next dose. Wearing O2 more and using neb twice daily.  Little cough- scant clear mucus. Aware of angina despite isorbide. Feet swell- varies, not new or progressive. CXR 10/07/11- COPD  04/27/12-  81 yoF  followed for COPD, complicated by allergic rhinitis, GERD, Crohns Disease, CAD/ stents FOLLOWS FOR: Increased SOB today-unsure of cause(used nebulizer before OV-not much  difference), did not hear any wheezing. Denies any cough or chest congestion Often feels tight at night. Aware of increased fluid retention occasionally. Denies chest pain, infection, wheeze or cough. Last visit we gave sample Breo ellipta, which she blames for fluid retention. Continues oxygen 2 L/Advanced for sleep and as needed  12/19/12- 81 yoF former smoker followed for COPD, complicated by allergic rhinitis, GERD, Crohns Disease, CAD/ stents     Family here. ACUTE:  Increased SOB, tightness that travels around to back and some wheezing.  Sore throat in am and  scratchy all day Continues oxygen 3 L/Advanced for sleep and as needed. She has been more dyspneic over past year. We reviewed Chest CT from the spring, noting it was done because of dyspnea then. Hosp 3/26-07/12/12 for AECOPD with NAD on CXR.  Now 3-4 weeks variable hoarseness- on magic mouthwash and prednisone taper. Some difficulty swallowing.  No cough, scant deep yellow sputum, some tight. No pain, blood, fever, palpitation. Easy DOE needing 3LO2 rest, 4-5L exertion in home. CT chest 07/05/12 IMPRESSION:  . Stable hyperinflation and chronic interstitial prominence. Again  noted left basilar scarring. No acute infiltrate or pulmonary  edema.  Original Report Authenticated By: [name], M.D. CXR 07/10/12 IMPRESSION:  No acute cardiopulmonary abnormality seen. Chronic findings as  described above.  Original Report Authenticated By: [name]., M.D.  01/30/13- 28 yoF former smoker followed for COPD, complicated by allergic rhinitis, GERD, Crohns Disease, CAD/ stents  Family here. FOLLOWS FOR: "weak as water"; swelling in feet, staying tired. Good and bad days Weight gain 10 pounds in the past month. Ankles swell. Little cough, scant yellow. Pain mid  thoracic spine. Continues O2 4L/ Advanced. Theophylline was no help and caused palpitations, so quit. Last prednisone 2 weeks ago. ENT/Dr. [name] saw her in September and diagnosed esophageal reflux and thrush. He doubled her acid blocker.  03/20/13- 42 yoF former smoker followed for COPD, complicated by allergic rhinitis, GERD, Crohns Disease, CAD/ stents   Daughter here FOLLOWS FOR: Breathing is unchanged. Reports SOB and coughing. Denies chest tightness or wheezing.   Dr. Kevan Ny is treating with Lasix. She is off of prednisone. Morning cough with clear mucus. Using Flutter device for pulmonary toilet. Medications discussed. Hosp for AECOPD 05/13/12- 05/15/12 CXR 01/30/13 IMPRESSION:  Bibasilar linear opacities, consistent with atelectasis or scarring.  Emphysematous changes.  Electronically Signed  By: [name] M.D.  On: 01/30/2013 16:54  07/03/13- 8 yoF former smoker followed for COPD, complicated by allergic rhinitis, GERD, Crohns Disease, CAD/ stents   Daughter here FOLLOWS FOR: Symbicort works well for patient about 4-5 hours and then uses rescue inhaler; using neb treatments as needed; but unsure if Spiriva is actually helping. Wonders if she can try [name] again as she has had past 2 days without any side effects. Now finishing prednisone and Z-Pak from primary physician. Denies any benefit from [facility]. Using either nebulizer or Proair once or twice daily.  09/18/13- 8 yoF former smoker followed for COPD, complicated by allergic rhinitis, GERD, Crohns Disease, CAD/ stents   Daughter here FOLLOWS FOR: Pt states breathing has worsened since last OV. Pt states the theophyillne isnt helping. Pt states she has an increase in SOB. C/o mild productive cough with clear mucus and chest tightness with deep breaths.   10/30/13- 82 yoF former smoker followed for COPD/ E, complicated by allergic rhinitis, GERD, Crohns Disease, CAD/ stents   Daughter here FOLLOWS FOR: Using up to 5l/m cont  at home; having a hard time breathing-heat gets her worse.  CT chest 09/19/13 IMPRESSION:  No evidence of significant pulmonary embolus. Extensive  emphysematous changes in the lungs.  Electronically Signed  By: Burman Nieves M.D.  On: 09/19/2013 22:10  01/01/14- 82 yoF former smoker followed for COPD/ E, complicated by allergic rhinitis, GERD, Crohns Disease, CAD/ stents   Daughter here FOLLOWS FOR: continues ot use 5L/M cont at home and 3L/m pulse when out; DME is [facility]. Could tell that Anoro helped better than Symbicort. Anoro is not on her Formulary list through insurance. Never really cleared the cough and yellow sputum after bronchitis in December 05/02/14- 83 yoF former smoker followed for COPD/ E, complicated by allergic rhinitis, GERD, Crohns Disease, CAD/ stents   Daughter here  FOLLOWS FOR: has noticed she has to wear O2 more during the day at 5.5L now.  Pt would like to discuss other options for meds as she feels the Anoro  and albuterol is no longer holding her breathing stable enough.  Asks help for a tier reduction on Symbicort. I suggested we try alternatives first. She wants medications that last longer between doses and feels her heart rhythm is controlled.. She wanted to try Symbicort 3 times daily. CXR 04/10/14 IMPRESSION: 1. Acute cardiopulmonary disease.- should read "NO acute pulmonary disease  -CDY 2. Pleural parenchymal scarring . Electronically Signed  By: Maisie Fus Register  On: 04/10/2014 11:53  07/01/14- 22 yoF former smoker followed for COPD/ E, complicated by  allergic rhinitis, GERD, Crohns Disease, CAD/ stents   Daughter here FOLLOW FOR:  COPD; lot of tightness in chest, coughing up a lot of clear mucus, sometimes yellow.  having to use rescue inhaler daily Hard cough x 3-4 days. No fever.  09/02/14- 45 yoF former smoker followed for COPD/ E, complicated by allergic rhinitis, GERD, Crohns Disease, CAD/ stents   Daughter here Reports: medication refill and wants  to stick with the Brovana. Prescription order is coming through [place]. Also has an Anoro inhaler which I gave in an effort to stabilize her bronchodilator control around-the-clock. She has had 2 episodes that sound like self-limited palpitations, which she attributes to Anoro. Neither albuterol rescue inhaler or nebulizer with albuterol helped her. She does use Flutter and Incentive Spirometer daily. O2 6L [facility] with home concentrator, 3 L portable.  10/03/14- 67 yoF former smoker followed for COPD/ E, complicated by allergic rhinitis, GERD, Crohns Disease, CAD/ stents, chronic liver disease   Daughter here FOLLOWS FOR: Pt feels she needs to increase her O2 to 4L/M when ambulating and 3L/M at rest. DME is [facility]. Pt states she "loved" the Brovana nebulizer tx's and sprivia respimat-but has noticed swelling in feet, ankles, and going up the leg. Weight down 1 lb since last ov 5/23. Platelets 94 K in March. Bilateral ankle edema left greater than right in the past week with tight feeling but no real pain. Reports Spiriva no help. Questions if Brovana nebulizer solution is causing the edema. No bleeding. Not clear why platelet count was low in March with liver enzymes increased.  ROS-see HPI Constitutional:   No-   weight loss, night sweats, fevers, chills, fatigue, lassitude. HEENT:   No-  headaches, +difficulty swallowing, tooth/dental problems, +sore throat,       No-  sneezing, itching, ear ache, nasal congestion, post nasal drip,  CV:  No-   chest pain, orthopnea, PND, +swelling in lower extremities, anasarca, dizziness, +palpitations Resp: +  shortness of breath with exertion or at rest.             +productive cough, no- non-productive cough,  No- coughing up of blood.              No- change in color of mucus.  No- wheezing.   Skin: No-rash or lesions. GI:  + heartburn, indigestion, no-abdominal pain, nausea, vomiting,  GU:  MS:  No-   joint pain or swelling.  + back pain Neuro-      nothing unusual Psych:  No- change in mood or affect. No depression or anxiety.  No memory loss.  Objective:  OBJ- Physical Exam General- Alert, Oriented, Affect-appropriate, Distress- none acute, wheelchair, +O2 3L portable here Skin- rash-none, lesions- none, excoriation- none Lymphadenopathy- none Head- atraumatic            Eyes- Gross vision intact, PERRLA, conjunctivae and secretions clear            Ears- +Hearing aids            Nose- Clear, no-Septal dev, mucus, polyps, erosion, perforation             Throat- Mallampati II , mucosa clear , drainage- none, tonsils- atrophic.  Neck- flexible , trachea midline, no stridor , thyroid nl, carotid no bruit Chest - symmetrical excursion , unlabored           Heart/CV- RRR , no murmur , no gallop  , no rub, nl s1 s2                           -  JVD-+ 1, edema +3 R>L, stasis changes- none, varices- none           Lung- decreased breath sounds, + few crackles, wheeze- none,                   cough-none ,  dullness-none, rub-none           Chest wall-  Abd-  Br/ Gen/ Rectal- Not done, not indicated Extrem- cyanosis- none, clubbing, none, atrophy- none, strength- nl.                  Homan's NEG  Neuro- grossly intact to observation  Assessment & Plan:

## 2014-10-03 NOTE — Assessment & Plan Note (Signed)
Platelet count on her 94,000 in March 2016 without recognized bleeding/consumption. Plan-recheck CBC with differential. If it remains abnormal, her primary physician I want to consider referral to hematology.

## 2014-10-03 NOTE — Assessment & Plan Note (Signed)
Appropriate for her to adjust oxygen 3-4 L. We again discussed available portable oxygen devices and she will continue to work with DME P[facility] Lager

## 2014-10-03 NOTE — Assessment & Plan Note (Signed)
Chest exam is unchanged. She prefers [place] and says Spiriva does not help. I don't think providers likely to explain her ankle edema but we will have her replace it with albuterol nebulizer solution for a week and see what happens.

## 2014-10-03 NOTE — Assessment & Plan Note (Signed)
For recheck labs pending next visit with her primary physician

## 2014-10-03 NOTE — Patient Instructions (Addendum)
Order- lab  CBC w diff, CMET, D-dimer   Dx centrilobular emphysema, dyspnea on exertion, peripheral edema  Try not using Brovana for a week to see if the edema gets better.  Script to [facility] for albuterol nebulizer solution to use this week instead of Brovana  We are stopping Spiriva, since it doesn't seem to help  Ok to use the Symbicort 2 puffs, then rinse mouth, twice daily

## 2014-10-04 ENCOUNTER — Encounter: Payer: Self-pay | Admitting: Family Medicine

## 2014-10-04 ENCOUNTER — Ambulatory Visit (INDEPENDENT_AMBULATORY_CARE_PROVIDER_SITE_OTHER): Payer: Medicare HMO | Admitting: Family Medicine

## 2014-10-04 VITALS — BP 128/70 | HR 82 | Temp 98.1°F | Wt 141.8 lb

## 2014-10-04 DIAGNOSIS — R0602 Shortness of breath: Secondary | ICD-10-CM

## 2014-10-04 DIAGNOSIS — R748 Abnormal levels of other serum enzymes: Secondary | ICD-10-CM

## 2014-10-04 DIAGNOSIS — R7989 Other specified abnormal findings of blood chemistry: Secondary | ICD-10-CM

## 2014-10-04 LAB — COMPREHENSIVE METABOLIC PANEL
ALK PHOS: 189 U/L — AB (ref 39–117)
ALT: 29 U/L (ref 0–35)
AST: 39 U/L — ABNORMAL HIGH (ref 0–37)
Albumin: 3.6 g/dL (ref 3.5–5.2)
BILIRUBIN TOTAL: 1.7 mg/dL — AB (ref 0.2–1.2)
BUN: 29 mg/dL — ABNORMAL HIGH (ref 6–23)
CALCIUM: 8.7 mg/dL (ref 8.4–10.5)
CO2: 18 meq/L — AB (ref 19–32)
Chloride: 115 mEq/L — ABNORMAL HIGH (ref 96–112)
Creatinine, Ser: 1.8 mg/dL — ABNORMAL HIGH (ref 0.40–1.20)
GFR: 28.66 mL/min — ABNORMAL LOW (ref >60.00)
Glucose, Bld: 156 mg/dL — ABNORMAL HIGH (ref 70–99)
Potassium: 4.4 mEq/L (ref 3.5–5.1)
SODIUM: 140 meq/L (ref 135–145)
Total Protein: 6.5 g/dL (ref 6.0–8.3)

## 2014-10-04 LAB — CBC WITH DIFFERENTIAL/PLATELET
BASOS ABS: 0 10*3/uL (ref 0.0–0.1)
Basophils Relative: 0.1 % (ref 0.0–3.0)
Eosinophils Absolute: 0.1 10*3/uL (ref 0.0–0.7)
Eosinophils Relative: 2.1 % (ref 0.0–5.0)
HEMATOCRIT: 30.5 % — AB (ref 36.0–46.0)
Hemoglobin: 10.2 g/dL — ABNORMAL LOW (ref 12.0–15.0)
LYMPHS ABS: 0.7 10*3/uL (ref 0.7–4.0)
Lymphocytes Relative: 20.6 % (ref 12.0–46.0)
MCHC: 33.4 g/dL (ref 30.0–36.0)
MCV: 91.9 fl (ref 78.0–100.0)
MONO ABS: 0.3 10*3/uL (ref 0.1–1.0)
Monocytes Relative: 7 % (ref 3.0–12.0)
Neutro Abs: 2.5 10*3/uL (ref 1.4–7.7)
Neutrophils Relative %: 70.2 % (ref 43.0–77.0)
Platelets: 74 10*3/uL — ABNORMAL LOW (ref 150.0–400.0)
RBC: 3.32 Mil/uL — ABNORMAL LOW (ref 3.87–5.11)
RDW: 16.6 % — AB (ref 11.5–15.5)
WBC: 3.6 10*3/uL — AB (ref 4.0–10.5)

## 2014-10-04 LAB — BRAIN NATRIURETIC PEPTIDE: PRO B NATRI PEPTIDE: 77 pg/mL (ref 0.0–100.0)

## 2014-10-04 LAB — D-DIMER, QUANTITATIVE: D-Dimer, Quant: 0.81 ug/mL-FEU — ABNORMAL HIGH (ref 0.00–0.48)

## 2014-10-04 NOTE — Patient Instructions (Signed)
We'll contact you with your lab report. If you get more short of breath or feel worse, then go the ER or dial 911.  Take care.  Glad to see you.

## 2014-10-04 NOTE — Progress Notes (Signed)
Pre visit review using our clinic review tool, if applicable. No additional management support is needed unless otherwise documented below in the visit note.  81 y/o with mult medical problems.  Known h/o low platelets, LFT elevation, pulmonary disease with recent abnormal labs (esp inc in Cr).  Here for f/u.  On ARB, K, lasix at baseline.  She has had inc in SOB and BLE edema recently w/o known cause- no salt loading, etc.  She had brovana changed to symbicort yesterday but the lab changes predate that.  No other new med changes.  All labs d/w pt.  Stat labs send today.  Still on O2 at baseline.   Meds, vitals, and allergies reviewed.   ROS: See HPI.  Otherwise, noncontributory.  nad On O2, no inc in WOB Mmm Neck supple, no LA rrr No inc in wob but dec in BS in B lower lung fields w/o wheeze abd soft 2+ BLE edema, pitting.

## 2014-10-05 DIAGNOSIS — R7989 Other specified abnormal findings of blood chemistry: Secondary | ICD-10-CM | POA: Insufficient documentation

## 2014-10-05 NOTE — Assessment & Plan Note (Signed)
She has known LFT abnormalities and leukopenia and I'll defer that to PCP.  The main issue now is he fluid status and elevated Cr.  I asked her "Do you think you need to be in the hospital?" after discussing her situation.  She stated that she did NOT need inpatient treatment. I agree at the time of the OV, but her condition could worsen.  She isn't worse from 6/23 to 6/24 at the OV. With any worsening of resp status or swelling, then to ER.  She agrees.  Stat labs done, Cr some better.  Would hold ARB and K for now, continue lasix.  Needs recheck with PCP Monday with f/u BMET.  I'll defer referral re: PLT/leukopenia/LFTs to PCP.  We'll have to make plans re: elevated Ct based on her f/u and sx.  At this point, I didn't want to image her kidneys.   >25 minutes spent in face to face time with patient, >50% spent in counselling or coordination of care.

## 2014-10-07 ENCOUNTER — Ambulatory Visit (INDEPENDENT_AMBULATORY_CARE_PROVIDER_SITE_OTHER): Payer: Medicare HMO | Admitting: Family Medicine

## 2014-10-07 ENCOUNTER — Telehealth: Payer: Self-pay | Admitting: *Deleted

## 2014-10-07 ENCOUNTER — Encounter: Payer: Self-pay | Admitting: Family Medicine

## 2014-10-07 VITALS — BP 138/62 | HR 74 | Temp 97.4°F | Wt 146.5 lb

## 2014-10-07 DIAGNOSIS — D696 Thrombocytopenia, unspecified: Secondary | ICD-10-CM

## 2014-10-07 DIAGNOSIS — R7989 Other specified abnormal findings of blood chemistry: Secondary | ICD-10-CM

## 2014-10-07 DIAGNOSIS — I10 Essential (primary) hypertension: Secondary | ICD-10-CM

## 2014-10-07 DIAGNOSIS — R748 Abnormal levels of other serum enzymes: Secondary | ICD-10-CM

## 2014-10-07 DIAGNOSIS — K7460 Unspecified cirrhosis of liver: Secondary | ICD-10-CM

## 2014-10-07 DIAGNOSIS — R945 Abnormal results of liver function studies: Secondary | ICD-10-CM

## 2014-10-07 NOTE — Telephone Encounter (Signed)
lmtcb

## 2014-10-07 NOTE — Telephone Encounter (Signed)
Will route to Dr Young

## 2014-10-07 NOTE — Assessment & Plan Note (Signed)
Reasonable control without ARB today.

## 2014-10-07 NOTE — Progress Notes (Signed)
Pre visit review using our clinic review tool, if applicable. No additional management support is needed unless otherwise documented below in the visit note. 

## 2014-10-07 NOTE — Telephone Encounter (Signed)
Patient meant to ask if she can go back on her [place]. She forgot to mention this at her visit. Please call [phone]. She has medication at home.

## 2014-10-07 NOTE — Progress Notes (Signed)
Subjective:   Patient ID: Brandi Cline, female    DOB: 09/28/1930, 81 y.o.   MRN: [id number]  Brandi Cline is a pleasant 81 y.o. year old female with complicated medical history, who presents to clinic today with her sister for  Follow-up  on 10/07/2014  HPI:  Saw Dr. Para March on 6/24 for abnormal labs from Dr. Roxy Cedar office.  Note reviewed.  Platelets had decreased more, liver function had increase and creatinine had increased.  Has known cirrhosis that predates when she established with our office.  ? Alpha one antitrypsin given lung manifestations as well.  Dr. Para March held her ARB and potassium for one week (per pt- not in note), while continuing her lasix.  She is here for follow up labs and re evaluation of her fluid status and symptoms.  Edema is about the same- no worse. Denies any yellowing of her eyes or skin.  No bleeding.   Lab Results  Component Value Date   CREATININE 1.80* 10/04/2014   Lab Results  Component Value Date   ALT 29 10/04/2014   AST 39* 10/04/2014   ALKPHOS 189* 10/04/2014   BILITOT 1.7* 10/04/2014   Lab Results  Component Value Date   WBC 3.6* 10/04/2014   HGB 10.2* 10/04/2014   HCT 30.5* 10/04/2014   MCV 91.9 10/04/2014   PLT 74.0* 10/04/2014   Current Outpatient Prescriptions on File Prior to Visit  Medication Sig Dispense Refill  . albuterol (PROVENTIL HFA;VENTOLIN HFA) 108 (90 BASE) MCG/ACT inhaler Inhale 2 puffs into the lungs daily as needed for wheezing or shortness of breath.    [name] albuterol (PROVENTIL) (2.5 MG/3ML) 0.083% nebulizer solution Take 3 mLs (2.5 mg total) by nebulization every 4 (four) hours as needed. Shortness of breath 75 mL 11  . albuterol (PROVENTIL) 2 MG tablet Take 1 tablet (2 mg total) by mouth 2 (two) times daily as needed for wheezing. 60 tablet 0  . ALPRAZolam (XANAX) 0.5 MG tablet TAKE ONE TABLET BY MOUTH THREE TIMES DAILY AS NEEDED FOR ANXIETY 270 tablet 0  . Apple Cider Vinegar 500 MG TABS Take 1 tablet by  mouth daily.    [name] aspirin 81 MG tablet Take 81 mg by mouth daily.    [name] BIOTIN 5000 PO Take 2 tablets by mouth daily.    . Cholecalciferol (VITAMIN D-3) 5000 UNITS TABS Take 1 tablet by mouth daily.    . Coenzyme Q10 (CO Q 10 PO) Take 2 tablets by mouth daily.     . Cranberry 250 MG TABS Take 2 tablets by mouth.    . cyanocobalamin (,VITAMIN B-12,) 1000 MCG/ML injection INJECT 1 ML IM ONCE A MONTH 3 mL 1  . fish oil-omega-3 fatty acids 1000 MG capsule Take 1 g by mouth 2 (two) times daily.     . furosemide (LASIX) 20 MG tablet Take 20-40 mg by mouth daily.    . isosorbide mononitrate (IMDUR) 30 MG 24 hr tablet TAKE TWO TABLETS BY MOUTH DAILY 60 tablet 11  . loratadine (CLARITIN) 10 MG tablet Take 10 mg by mouth daily as needed.     . magnesium oxide (MAG-OX) 400 MG tablet Take 400 mg by mouth daily.     . Multiple Vitamin (MULTIVITAMIN) tablet Take 1 tablet by mouth daily.     . nitroGLYCERIN (NITROSTAT) 0.4 MG SL tablet Place 1 tablet (0.4 mg total) under the tongue every 5 (five) minutes as needed. Chest pain 25 tablet 3  . omeprazole (PRILOSEC) 20  MG capsule TAKE ONE (1) CAPSULE BY MOUTH EACH DAY 90 capsule 1  . Tiotropium Bromide-Olodaterol 2.5-2.5 MCG/ACT AERS Inhale into the lungs.    . vitamin E 1000 UNIT capsule Take 1,000 Units by mouth daily.    . Zinc Acetate, Oral, (ZINC ACETATE PO) Take 1 tablet by mouth daily.    [name] arformoterol (BROVANA) 15 MCG/2ML NEBU Take 2 mLs (15 mcg total) by nebulization 2 (two) times daily. (Patient not taking: Reported on 10/07/2014) 60 mL prn  . losartan (COZAAR) 50 MG tablet Take 1 tablet (50 mg total) by mouth daily. (Patient not taking: Reported on 10/07/2014) 30 tablet 11  . potassium chloride SA (K-DUR,KLOR-CON) 20 MEQ tablet Take 1 tablet (20 mEq total) by mouth 2 (two) times daily. Alternate taking 2 one day then 1 the next (Patient not taking: Reported on 10/07/2014) 180 tablet 1   No current facility-administered medications on file prior to visit.      Allergies  Allergen Reactions  . Avelox [Moxifloxacin Hcl In Nacl] Shortness Of Breath, Swelling and Rash  . Theophyllines     Slight increased heart rate  . Ciprofloxacin   . Codeine Other (See Comments)    Couldn't breath  . Milk-Related Compounds Other (See Comments)    Hurts stomach.. Whole milk, not all dairy products  . Lisinopril Rash    Past Medical History  Diagnosis Date  . Dyspnea   . Esophageal reflux   . CAD (coronary artery disease)   . Hypertension   . Crohn's disease   . Allergic rhinitis   . COPD (chronic obstructive pulmonary disease)   . History of kidney stones   . History of shingles   . Trigeminal neuralgia   . Family history of anesthesia complication     " SISTER HAD BAD FEELINGS "  . Anginal pain   . Anxiety     ' JUST FOR MY BREATHING "  . Arthritis     mild in hands  . Hyperlipidemia     Past Surgical History  Procedure Laterality Date  . Coronary stents    . Lithotripsy    . Ureteral stent placement    . Cholecystectomy    . Total abdominal hysterectomy    . Partial resections large and small bowel for crohn's    . Breast lumps benign    . Left heart catheterization with coronary angiogram N/A 10/28/2011    Procedure: LEFT HEART CATHETERIZATION WITH CORONARY ANGIOGRAM;  Surgeon: Lesleigh Noe, MD;  Location: [facility] C[facility] LAB;  Service: Cardiovascular;  Laterality: N/A;    Family History  Problem Relation Age of Onset  . Breast cancer Sister   . COPD Sister   . Stroke Mother   . Prostate cancer Father   . Prostate cancer Brother   . Heart attack Brother     History   Social History  . Marital Status: Widowed    Spouse Name: N/A  . Number of Children: N/A  . Years of Education: N/A   Occupational History  . PT [employer]    Social History Main Topics  . Smoking status: Former Smoker -- 1.00 packs/day for 30 years    Types: Cigarettes    Quit date: 04/12/1990  . Smokeless tobacco: Never Used  . Alcohol Use: No   . Drug Use: No  . Sexual Activity: No   Other Topics Concern  . Not on file   Social History Narrative   Has a living  will and HPOA- Johara Lodwick.   Would desire CPR.   Would not want prolonged life support, would not want feeding tubes.         The PMH, PSH, Social History, Family History, Medications, and allergies have been reviewed in [facility], and have been updated if relevant.    Review of Systems  Constitutional: Negative.   Respiratory: Negative.   Cardiovascular: Positive for leg swelling. Negative for chest pain.  Genitourinary: Negative.   Musculoskeletal: Negative.   Skin: Negative for color change.  Neurological: Negative.   Hematological: Negative.   Psychiatric/Behavioral: Negative.   All other systems reviewed and are negative.      Objective:    BP 138/62 mmHg  Pulse 74  Temp(Src) 97.4 F (36.3 C) (Oral)  Wt 146 lb 8 oz (66.452 kg)  SpO2 97%   Physical Exam  Constitutional: She is oriented to person, place, and time. She appears well-developed and well-nourished. No distress.  HENT:  Head: Normocephalic.  Eyes: Conjunctivae are normal.  Cardiovascular: Normal rate.   Abdominal: Soft. She exhibits no distension. There is no tenderness.  Musculoskeletal:  2+ edema bilaterally  Neurological: She is alert and oriented to person, place, and time. No cranial nerve deficit.  Skin: Skin is warm and dry.  No jaundice  Psychiatric: She has a normal mood and affect. Her behavior is normal. Judgment and thought content normal.  Nursing note and vitals reviewed.         Assessment & Plan:   Elevated serum creatinine  Elevated liver function tests  Cirrhosis of liver without ascites, unspecified hepatic cirrhosis type  Thrombocytopenia No Follow-up on file.

## 2014-10-07 NOTE — Telephone Encounter (Signed)
Ok to use Brovana neb solution twice daily as needed, as before, instead of albuterol neb solution.

## 2014-10-07 NOTE — Patient Instructions (Addendum)
Great to see you. We will call you with your lab results.  Please come see me in 2 weeks.

## 2014-10-07 NOTE — Assessment & Plan Note (Signed)
>  40 minutes spent in face to face time with patient, >50% spent in counselling or coordination of care discussing her elevated liver and kidney function along with her thrombocytopenia.  She feels she does not want multiple referrals to specialists- liver function elevated but relatively stable over past year and she has had no frank bleeding.  Even if blood cancer were discovered, she would decline treatment.  She also is not sure if she wants to undergo a liver biopsy- we discussed ? Alpha one antitrypsin disease given her lung and liver issues. She agrees to recheck labs today and then follow up in 2 weeks- will discuss again at that time.

## 2014-10-07 NOTE — Assessment & Plan Note (Signed)
Recheck cr today.  If at baseline, restart potassium and ARB.  Follow up in 2 weeks to repeat labs and BP.

## 2014-10-08 ENCOUNTER — Other Ambulatory Visit: Payer: Self-pay | Admitting: Family Medicine

## 2014-10-08 DIAGNOSIS — R7989 Other specified abnormal findings of blood chemistry: Secondary | ICD-10-CM

## 2014-10-08 DIAGNOSIS — D649 Anemia, unspecified: Secondary | ICD-10-CM

## 2014-10-08 LAB — COMPREHENSIVE METABOLIC PANEL
ALK PHOS: 170 U/L — AB (ref 39–117)
ALT: 32 U/L (ref 0–35)
AST: 45 U/L — AB (ref 0–37)
Albumin: 3.7 g/dL (ref 3.5–5.2)
BUN: 26 mg/dL — AB (ref 6–23)
CALCIUM: 8.3 mg/dL — AB (ref 8.4–10.5)
CHLORIDE: 111 meq/L (ref 96–112)
CO2: 18 meq/L — AB (ref 19–32)
CREATININE: 1.65 mg/dL — AB (ref 0.40–1.20)
GFR: 31.68 mL/min — AB (ref >60.00)
GLUCOSE: 146 mg/dL — AB (ref 70–99)
Potassium: 3.6 mEq/L (ref 3.5–5.1)
Sodium: 141 mEq/L (ref 135–145)
Total Bilirubin: 1.4 mg/dL — ABNORMAL HIGH (ref 0.2–1.2)
Total Protein: 6.8 g/dL (ref 6.0–8.3)

## 2014-10-08 LAB — CBC WITH DIFFERENTIAL/PLATELET
BASOS ABS: 0 10*3/uL (ref 0.0–0.1)
BASOS PCT: 0.6 % (ref 0.0–3.0)
EOS ABS: 0.1 10*3/uL (ref 0.0–0.7)
Eosinophils Relative: 2.6 % (ref 0.0–5.0)
HEMATOCRIT: 29.6 % — AB (ref 36.0–46.0)
Hemoglobin: 9.9 g/dL — ABNORMAL LOW (ref 12.0–15.0)
LYMPHS ABS: 1 10*3/uL (ref 0.7–4.0)
Lymphocytes Relative: 19.3 % (ref 12.0–46.0)
MCHC: 33.4 g/dL (ref 30.0–36.0)
MCV: 92.3 fl (ref 78.0–100.0)
Monocytes Absolute: 0.5 10*3/uL (ref 0.1–1.0)
Monocytes Relative: 9.3 % (ref 3.0–12.0)
NEUTROS ABS: 3.4 10*3/uL (ref 1.4–7.7)
Neutrophils Relative %: 68.2 % (ref 43.0–77.0)
PLATELETS: 84 10*3/uL — AB (ref 150.0–400.0)
RBC: 3.21 Mil/uL — AB (ref 3.87–5.11)
RDW: 16.8 % — ABNORMAL HIGH (ref 11.5–15.5)
WBC: 5 10*3/uL (ref 4.0–10.5)

## 2014-10-08 MED ORDER — ALBUTEROL SULFATE HFA 108 (90 BASE) MCG/ACT IN AERS: 2.0000 | INHALATION_SPRAY | Freq: Four times a day (QID) | RESPIRATORY_TRACT | Status: AC | PRN

## 2014-10-08 NOTE — Telephone Encounter (Signed)
Patient notified. Rx sent to pharmacy. Nothing further needed.  

## 2014-10-08 NOTE — Telephone Encounter (Signed)
Informed pt to do the Brovana neb BID. She is asking if you will call her in a Ventolin inhaler to her pharmacy to use in the middle of the day when the Se[place] wears off. States she gets SOB in the middle of the day.  CY please advise.

## 2014-10-08 NOTE — Telephone Encounter (Signed)
Ok Ventolin HFA, # 1, ref x 11    1-2 puffs every 4-6 hours- rescue inhaler

## 2014-10-16 ENCOUNTER — Other Ambulatory Visit (INDEPENDENT_AMBULATORY_CARE_PROVIDER_SITE_OTHER): Payer: Medicare HMO

## 2014-10-16 DIAGNOSIS — D649 Anemia, unspecified: Secondary | ICD-10-CM | POA: Diagnosis not present

## 2014-10-16 LAB — COMPREHENSIVE METABOLIC PANEL
ALT: 31 U/L (ref 0–35)
AST: 46 U/L — AB (ref 0–37)
Albumin: 3.4 g/dL — ABNORMAL LOW (ref 3.5–5.2)
Alkaline Phosphatase: 171 U/L — ABNORMAL HIGH (ref 39–117)
BILIRUBIN TOTAL: 2.2 mg/dL — AB (ref 0.2–1.2)
BUN: 16 mg/dL (ref 6–23)
CALCIUM: 8.6 mg/dL (ref 8.4–10.5)
CO2: 24 mEq/L (ref 19–32)
Chloride: 108 mEq/L (ref 96–112)
Creatinine, Ser: 1.28 mg/dL — ABNORMAL HIGH (ref 0.40–1.20)
GFR: 42.47 mL/min — ABNORMAL LOW (ref >60.00)
Glucose, Bld: 96 mg/dL (ref 70–99)
Potassium: 3.6 mEq/L (ref 3.5–5.1)
Sodium: 142 mEq/L (ref 135–145)
Total Protein: 6.4 g/dL (ref 6.0–8.3)

## 2014-10-17 LAB — CBC WITH DIFFERENTIAL/PLATELET
Basophils Absolute: 0 10*3/uL (ref 0.0–0.1)
Basophils Relative: 0.3 % (ref 0.0–3.0)
EOS ABS: 0.1 10*3/uL (ref 0.0–0.7)
Eosinophils Relative: 2.7 % (ref 0.0–5.0)
HCT: 31 % — ABNORMAL LOW (ref 36.0–46.0)
Hemoglobin: 10.4 g/dL — ABNORMAL LOW (ref 12.0–15.0)
LYMPHS PCT: 18 % (ref 12.0–46.0)
Lymphs Abs: 0.9 10*3/uL (ref 0.7–4.0)
MCHC: 33.5 g/dL (ref 30.0–36.0)
MCV: 90.9 fl (ref 78.0–100.0)
MONOS PCT: 8.4 % (ref 3.0–12.0)
Monocytes Absolute: 0.4 10*3/uL (ref 0.1–1.0)
Neutro Abs: 3.4 10*3/uL (ref 1.4–7.7)
Neutrophils Relative %: 70.6 % (ref 43.0–77.0)
Platelets: 95 10*3/uL — ABNORMAL LOW (ref 150.0–400.0)
RBC: 3.41 Mil/uL — AB (ref 3.87–5.11)
RDW: 16.8 % — AB (ref 11.5–15.5)
WBC: 4.9 10*3/uL (ref 4.0–10.5)

## 2014-10-21 ENCOUNTER — Ambulatory Visit (INDEPENDENT_AMBULATORY_CARE_PROVIDER_SITE_OTHER): Payer: Medicare HMO | Admitting: Family Medicine

## 2014-10-21 ENCOUNTER — Encounter: Payer: Self-pay | Admitting: Family Medicine

## 2014-10-21 VITALS — BP 140/58 | HR 76 | Temp 97.4°F | Wt 149.2 lb

## 2014-10-21 DIAGNOSIS — R600 Localized edema: Secondary | ICD-10-CM | POA: Diagnosis not present

## 2014-10-21 DIAGNOSIS — R748 Abnormal levels of other serum enzymes: Secondary | ICD-10-CM

## 2014-10-21 DIAGNOSIS — R945 Abnormal results of liver function studies: Principal | ICD-10-CM

## 2014-10-21 DIAGNOSIS — R7989 Other specified abnormal findings of blood chemistry: Secondary | ICD-10-CM

## 2014-10-21 DIAGNOSIS — D649 Anemia, unspecified: Secondary | ICD-10-CM | POA: Insufficient documentation

## 2014-10-21 DIAGNOSIS — R109 Unspecified abdominal pain: Secondary | ICD-10-CM | POA: Insufficient documentation

## 2014-10-21 LAB — BRAIN NATRIURETIC PEPTIDE: Pro B Natriuretic peptide (BNP): 121 pg/mL — ABNORMAL HIGH (ref 0.0–100.0)

## 2014-10-21 NOTE — Assessment & Plan Note (Signed)
Persistent with worsening LE edema. H/o abdominal surgeries (h/o IBD). CT of abd/pelvis for further evaluation. The patient indicates understanding of these issues and agrees with the plan.

## 2014-10-21 NOTE — Progress Notes (Signed)
Pre visit review using our clinic review tool, if applicable. No additional management support is needed unless otherwise documented below in the visit note. 

## 2014-10-21 NOTE — Progress Notes (Signed)
Subjective:   Patient ID: Brandi Cline, female    DOB: [birth year], 81 y.o.   MRN: [id number]  Brandi Cline is a pleasant 81 y.o. year old female who presents to clinic today with Follow-up  on 10/21/2014  HPI:  Here for two week follow up.  We started her medications, including cozaar which was held by Dr. Para March due to decreased kidney function.  Potassium was also held while continuing her lasix at those previous visits with another provider.  She has not restarted her rxs.  Wanted to see me here first.  Kidney function still abnormal but improving.  H/H still low but stable over recent weeks.  We referred her to hematology.  Awaiting that appointment still.  LE edema but she is still taking her lasix.  Now feels pressure in her abdomen and back intermittently as well.  Breathing is about the same.  Lab Results  Component Value Date   NA 142 10/16/2014   K 3.6 10/16/2014   CL 108 10/16/2014   CO2 24 10/16/2014   Lab Results  Component Value Date   CREATININE 1.28* 10/16/2014      Current Outpatient Prescriptions on File Prior to Visit  Medication Sig Dispense Refill  . albuterol (PROVENTIL HFA;VENTOLIN HFA) 108 (90 BASE) MCG/ACT inhaler Inhale 2 puffs into the lungs daily as needed for wheezing or shortness of breath.    [name] albuterol (PROVENTIL HFA;VENTOLIN HFA) 108 (90 BASE) MCG/ACT inhaler Inhale 2 puffs into the lungs every 6 (six) hours as needed for wheezing or shortness of breath. 1 Inhaler 11  . albuterol (PROVENTIL) (2.5 MG/3ML) 0.083% nebulizer solution Take 3 mLs (2.5 mg total) by nebulization every 4 (four) hours as needed. Shortness of breath 75 mL 11  . albuterol (PROVENTIL) 2 MG tablet Take 1 tablet (2 mg total) by mouth 2 (two) times daily as needed for wheezing. 60 tablet 0  . ALPRAZolam (XANAX) 0.5 MG tablet TAKE ONE TABLET BY MOUTH THREE TIMES DAILY AS NEEDED FOR ANXIETY 270 tablet 0  . Apple Cider Vinegar 500 MG TABS Take 1 tablet by mouth daily.      [name] arformoterol (BROVANA) 15 MCG/2ML NEBU Take 2 mLs (15 mcg total) by nebulization 2 (two) times daily. 60 mL prn  . aspirin 81 MG tablet Take 81 mg by mouth daily.    [name] BIOTIN 5000 PO Take 2 tablets by mouth daily.    . Cholecalciferol (VITAMIN D-3) 5000 UNITS TABS Take 1 tablet by mouth daily.    . Coenzyme Q10 (CO Q 10 PO) Take 2 tablets by mouth daily.     . Cranberry 250 MG TABS Take 2 tablets by mouth.    . cyanocobalamin (,VITAMIN B-12,) 1000 MCG/ML injection INJECT 1 ML IM ONCE A MONTH 3 mL 1  . fish oil-omega-3 fatty acids 1000 MG capsule Take 1 g by mouth 2 (two) times daily.     . furosemide (LASIX) 20 MG tablet Take 20-40 mg by mouth daily.    . isosorbide mononitrate (IMDUR) 30 MG 24 hr tablet TAKE TWO TABLETS BY MOUTH DAILY 60 tablet 11  . loratadine (CLARITIN) 10 MG tablet Take 10 mg by mouth daily as needed.     . magnesium oxide (MAG-OX) 400 MG tablet Take 400 mg by mouth daily.     . Multiple Vitamin (MULTIVITAMIN) tablet Take 1 tablet by mouth daily.     . nitroGLYCERIN (NITROSTAT) 0.4 MG SL tablet Place 1 tablet (0.4 mg total)  under the tongue every 5 (five) minutes as needed. Chest pain 25 tablet 3  . omeprazole (PRILOSEC) 20 MG capsule TAKE ONE (1) CAPSULE BY MOUTH EACH DAY 90 capsule 1  . Tiotropium Bromide-Olodaterol 2.5-2.5 MCG/ACT AERS Inhale into the lungs.    . vitamin E 1000 UNIT capsule Take 1,000 Units by mouth daily.    . Zinc Acetate, Oral, (ZINC ACETATE PO) Take 1 tablet by mouth daily.    [name] losartan (COZAAR) 50 MG tablet Take 1 tablet (50 mg total) by mouth daily. (Patient not taking: Reported on 10/07/2014) 30 tablet 11  . potassium chloride SA (K-DUR,KLOR-CON) 20 MEQ tablet Take 1 tablet (20 mEq total) by mouth 2 (two) times daily. Alternate taking 2 one day then 1 the next (Patient not taking: Reported on 10/07/2014) 180 tablet 1   No current facility-administered medications on file prior to visit.    Allergies  Allergen Reactions  . Avelox  [Moxifloxacin Hcl In Nacl] Shortness Of Breath, Swelling and Rash  . Theophyllines     Slight increased heart rate  . Ciprofloxacin   . Codeine Other (See Comments)    Couldn't breath  . Milk-Related Compounds Other (See Comments)    Hurts stomach.. Whole milk, not all dairy products  . Lisinopril Rash    Past Medical History  Diagnosis Date  . Dyspnea   . Esophageal reflux   . CAD (coronary artery disease)   . Hypertension   . Crohn's disease   . Allergic rhinitis   . COPD (chronic obstructive pulmonary disease)   . History of kidney stones   . History of shingles   . Trigeminal neuralgia   . Family history of anesthesia complication     " SISTER HAD BAD FEELINGS "  . Anginal pain   . Anxiety     ' JUST FOR MY BREATHING "  . Arthritis     mild in hands  . Hyperlipidemia     Past Surgical History  Procedure Laterality Date  . Coronary stents    . Lithotripsy    . Ureteral stent placement    . Cholecystectomy    . Total abdominal hysterectomy    . Partial resections large and small bowel for crohn's    . Breast lumps benign    . Left heart catheterization with coronary angiogram N/A 10/28/2011    Procedure: LEFT HEART CATHETERIZATION WITH CORONARY ANGIOGRAM;  Surgeon: Lesleigh Noe, MD;  Location: [facility] CATH LAB;  Service: Cardiovascular;  Laterality: N/A;    Family History  Problem Relation Age of Onset  . Breast cancer Sister   . COPD Sister   . Stroke Mother   . Prostate cancer Father   . Prostate cancer Brother   . Heart attack Brother     History   Social History  . Marital Status: Widowed    Spouse Name: N/A  . Number of Children: N/A  . Years of Education: N/A   Occupational History  . PT [employer]    Social History Main Topics  . Smoking status: Former Smoker -- 1.00 packs/day for 30 years    Types: Cigarettes    Quit date: 04/12/1990  . Smokeless tobacco: Never Used  . Alcohol Use: No  . Drug Use: No  . Sexual Activity: No    Other Topics Concern  . Not on file   Social History Narrative   Has a living will and HPOA- Brandi Cline.   Would desire CPR.  Would not want prolonged life support, would not want feeding tubes.         The PMH, PSH, Social History, Family History, Medications, and allergies have been reviewed in [facility], and have been updated if relevant.   Review of Systems  Constitutional: Positive for fatigue. Negative for fever.  Respiratory: Positive for shortness of breath.   Cardiovascular: Positive for leg swelling. Negative for chest pain and palpitations.  Gastrointestinal: Positive for abdominal pain and abdominal distention. Negative for nausea, diarrhea, blood in stool, anal bleeding and rectal pain.  Endocrine: Positive for cold intolerance.  Genitourinary: Positive for urgency. Negative for dysuria.  Musculoskeletal: Positive for back pain.  Hematological: Negative.   All other systems reviewed and are negative.      Objective:    BP 140/58 mmHg  Pulse 76  Temp(Src) 97.4 F (36.3 C) (Oral)  Wt 149 lb 4 oz (67.699 kg)  SpO2 95%  Wt Readings from Last 3 Encounters:  10/21/14 149 lb 4 oz (67.699 kg)  10/07/14 146 lb 8 oz (66.452 kg)  10/04/14 141 lb 12 oz (64.297 kg)    Physical Exam  Constitutional: She is oriented to person, place, and time. She appears well-developed and well-nourished. No distress.  HENT:  Head: Normocephalic.  Eyes: Conjunctivae are normal.  Neck: Normal range of motion.  Cardiovascular: Normal rate and regular rhythm.   Pulmonary/Chest:  Decreased BS bilaterally, no wheezing  Musculoskeletal:  2+ edema LE bilaterally with weeping, no erythema, drainage is clear  Neurological: She is alert and oriented to person, place, and time. No cranial nerve deficit.  Skin: Skin is warm and dry.  Psychiatric: She has a normal mood and affect. Her behavior is normal. Judgment and thought content normal.          Assessment & Plan:   Elevated liver  function tests  Elevated serum creatinine No Follow-up on file.

## 2014-10-21 NOTE — Assessment & Plan Note (Addendum)
Deteriorated despite taking lasix 40 mg daily.  3 pound weight gain in 2 weeks. Restart Cozaar.  Check BNP.  May need to refer back to her cardiologist, Dr. Katrinka Blazing for repeat echo.

## 2014-10-21 NOTE — Patient Instructions (Addendum)
Good to see you. We will call you with your lab results.  Brandi Cline on your way out.  Please restart your cozaar and potassium.

## 2014-10-21 NOTE — Assessment & Plan Note (Signed)
Persistent but stable.  She is scheduled to see hematology.  Will await starting iron given GI issues. IV iron likely more appropriate. The patient indicates understanding of these issues and agrees with the plan.

## 2014-10-22 ENCOUNTER — Telehealth: Payer: Self-pay | Admitting: Oncology

## 2014-10-22 ENCOUNTER — Other Ambulatory Visit: Payer: Self-pay | Admitting: Family Medicine

## 2014-10-22 ENCOUNTER — Ambulatory Visit (INDEPENDENT_AMBULATORY_CARE_PROVIDER_SITE_OTHER)
Admission: RE | Admit: 2014-10-22 | Discharge: 2014-10-22 | Disposition: A | Discharge status: Home / Self Care | Payer: Medicare HMO | Source: Ambulatory Visit | Attending: Family Medicine | Admitting: Family Medicine

## 2014-10-22 DIAGNOSIS — R109 Unspecified abdominal pain: Secondary | ICD-10-CM | POA: Diagnosis not present

## 2014-10-22 DIAGNOSIS — R188 Other ascites: Secondary | ICD-10-CM

## 2014-10-22 DIAGNOSIS — K7460 Unspecified cirrhosis of liver: Secondary | ICD-10-CM

## 2014-10-22 NOTE — Telephone Encounter (Signed)
new patient appt-s/w patient and gave np appt for 08/24 @ 10:30 w/Dr. Clelia Croft Referring Dr. [name]  Dx- Anemia

## 2014-10-24 ENCOUNTER — Other Ambulatory Visit: Payer: Self-pay | Admitting: *Deleted

## 2014-10-31 ENCOUNTER — Encounter: Payer: Self-pay | Admitting: Gastroenterology

## 2014-11-01 ENCOUNTER — Other Ambulatory Visit: Payer: Self-pay | Admitting: Interventional Cardiology

## 2014-11-07 ENCOUNTER — Telehealth: Payer: Self-pay | Admitting: Family Medicine

## 2014-11-07 NOTE — Telephone Encounter (Signed)
Spoke to pt and informed her that the paperwork was faxed to this office and completed by someone here, but I was not aware who. States she was not charged for the brace and will contact the company back directly

## 2014-11-07 NOTE — Telephone Encounter (Signed)
Pt called wanting waynetta to call her back  She received back brace from the [facility]. In may   She doesn't remember ever stating she wanted one.

## 2014-11-19 ENCOUNTER — Telehealth: Payer: Self-pay

## 2014-11-19 NOTE — Telephone Encounter (Signed)
I have already spoken to pt and advised her that we did not request form. It was sent to the office and completed by someone here;unaware of who completed form. Pt was to speak with insurance company but was not worried, as she was not charged for the brace. (see 10/2014 phone note)

## 2014-11-19 NOTE — Telephone Encounter (Signed)
Pt received letter from [employer] asking pt if she requested a back brace; pt said she is not sure how pt got a back brace in May 2016 and pt wants to know if Dr [name] suggested and ordered a back brace for pt. In May pt received a package by [employer] that contained a back brace; pt assumed Dr [name] had ordered for pt due to pt talking with Dr [name] about pt having back issues before. Now pt gets letter from [place] wanting to know if she requested the back brace in May. Please see 08/26/14 entry under media tab from [employer]. Pt request cb.

## 2014-11-19 NOTE — Telephone Encounter (Signed)
Noted! Thank you

## 2014-11-19 NOTE — Telephone Encounter (Signed)
Yes I signed it because we received the order that we assumed she asked for.  If she did not, this is a big issue and could be fraud from the company.  Marcelline Mates, please make sure that pt is aware that I did not ask for this order, only signed it because we thought she requested it.  If she did not, needs to go to [place] to be reported as possible fraud.

## 2014-11-20 ENCOUNTER — Telehealth: Payer: Self-pay

## 2014-11-20 NOTE — Telephone Encounter (Addendum)
Brandi Cline pts daughter (DPR signed) left v/m; pt was referred Annett Gula MD for 12/04/14; pt does not know who did this referral and why she is going to see Dr Juliette Alcide. Dois Davenport request cb ASAP. See 10/21/14 office note.

## 2014-11-20 NOTE — Telephone Encounter (Signed)
It looks like Dr. [name] referred her to hematology for ongoing anemia that will likely require iron infusion. Dr. Teressa Lower is the anemia specialist.

## 2014-11-21 NOTE — Telephone Encounter (Signed)
Attempted to contact pts daughter, work vm full. Spoke to pt and informed her she was referred due to anemia.

## 2014-12-03 ENCOUNTER — Other Ambulatory Visit: Payer: Self-pay | Admitting: Family Medicine

## 2014-12-04 ENCOUNTER — Ambulatory Visit (HOSPITAL_BASED_OUTPATIENT_CLINIC_OR_DEPARTMENT_OTHER): Payer: Medicare HMO

## 2014-12-04 ENCOUNTER — Ambulatory Visit (HOSPITAL_BASED_OUTPATIENT_CLINIC_OR_DEPARTMENT_OTHER): Payer: Medicare HMO | Admitting: Oncology

## 2014-12-04 ENCOUNTER — Telehealth: Payer: Self-pay | Admitting: Oncology

## 2014-12-04 ENCOUNTER — Encounter: Payer: Self-pay | Admitting: Oncology

## 2014-12-04 ENCOUNTER — Ambulatory Visit: Payer: Medicare HMO

## 2014-12-04 VITALS — BP 144/67 | HR 77 | Temp 97.7°F | Resp 18 | Ht 63.0 in | Wt 144.6 lb

## 2014-12-04 DIAGNOSIS — K7469 Other cirrhosis of liver: Secondary | ICD-10-CM

## 2014-12-04 DIAGNOSIS — D638 Anemia in other chronic diseases classified elsewhere: Secondary | ICD-10-CM

## 2014-12-04 DIAGNOSIS — R627 Adult failure to thrive: Secondary | ICD-10-CM

## 2014-12-04 DIAGNOSIS — D696 Thrombocytopenia, unspecified: Secondary | ICD-10-CM

## 2014-12-04 DIAGNOSIS — N289 Disorder of kidney and ureter, unspecified: Secondary | ICD-10-CM | POA: Diagnosis not present

## 2014-12-04 DIAGNOSIS — D649 Anemia, unspecified: Secondary | ICD-10-CM

## 2014-12-04 DIAGNOSIS — N182 Chronic kidney disease, stage 2 (mild): Secondary | ICD-10-CM

## 2014-12-04 DIAGNOSIS — R5383 Other fatigue: Secondary | ICD-10-CM

## 2014-12-04 LAB — CBC WITH DIFFERENTIAL/PLATELET
BASO%: 0.3 % (ref 0.0–2.0)
BASOS ABS: 0 10*3/uL (ref 0.0–0.1)
EOS ABS: 0.1 10*3/uL (ref 0.0–0.5)
EOS%: 2.5 % (ref 0.0–7.0)
HCT: 27.7 % — ABNORMAL LOW (ref 34.8–46.6)
HEMOGLOBIN: 9.2 g/dL — AB (ref 11.6–15.9)
LYMPH#: 0.8 10*3/uL — AB (ref 0.9–3.3)
LYMPH%: 20.8 % (ref 14.0–49.7)
MCH: 30.1 pg (ref 25.1–34.0)
MCHC: 33.2 g/dL (ref 31.5–36.0)
MCV: 90.5 fL (ref 79.5–101.0)
MONO#: 0.4 10*3/uL (ref 0.1–0.9)
MONO%: 9.5 % (ref 0.0–14.0)
NEUT%: 66.9 % (ref 38.4–76.8)
NEUTROS ABS: 2.7 10*3/uL (ref 1.5–6.5)
NRBC: 0 % (ref 0–0)
PLATELETS: 72 10*3/uL — AB (ref 145–400)
RBC: 3.06 10*6/uL — AB (ref 3.70–5.45)
RDW: 15.2 % — AB (ref 11.2–14.5)
WBC: 4 10*3/uL (ref 3.9–10.3)

## 2014-12-04 LAB — COMPREHENSIVE METABOLIC PANEL (CC13)
ALBUMIN: 3.4 g/dL — AB (ref 3.5–5.0)
ALK PHOS: 187 U/L — AB (ref 40–150)
ALT: 30 U/L (ref 0–55)
ANION GAP: 10 meq/L (ref 3–11)
AST: 38 U/L — ABNORMAL HIGH (ref 5–34)
BILIRUBIN TOTAL: 1.38 mg/dL — AB (ref 0.20–1.20)
BUN: 42.8 mg/dL — ABNORMAL HIGH (ref 7.0–26.0)
CO2: 19 meq/L — AB (ref 22–29)
CREATININE: 2.6 mg/dL — AB (ref 0.6–1.1)
Calcium: 8.5 mg/dL (ref 8.4–10.4)
Chloride: 113 mEq/L — ABNORMAL HIGH (ref 98–109)
EGFR: 16 mL/min/{1.73_m2} — AB (ref >90)
GLUCOSE: 120 mg/dL (ref 70–140)
Potassium: 5.2 mEq/L — ABNORMAL HIGH (ref 3.5–5.1)
Sodium: 142 mEq/L (ref 136–145)
TOTAL PROTEIN: 6.3 g/dL — AB (ref 6.4–8.3)

## 2014-12-04 LAB — IRON AND TIBC CHCC
%SAT: 21 % (ref 21–57)
IRON: 70 ug/dL (ref 41–142)
TIBC: 331 ug/dL (ref 236–444)
UIBC: 261 ug/dL (ref 120–384)

## 2014-12-04 LAB — FERRITIN CHCC: Ferritin: 89 ng/ml (ref 9–269)

## 2014-12-04 NOTE — Telephone Encounter (Signed)
Patient sent back to lab and given avs report and appointments for September  °

## 2014-12-04 NOTE — Consult Note (Signed)
Reason for Referral: Anemia.   HPI: 81 year old woman currently of [name] where she lived the majority of her life. She is a pleasant woman with a history of coronary artery disease, Crohn's disease and COPD. She has been oxygen dependent for the last 2 years. Although she lives independently, she had been declining physically in the last few months. She has reported increased fatigue and tiredness and dyspnea on exertion. She was also found to have generalized anasarca. CT scan on 10/22/2014 showed cirrhosis of the liver and ascites. No evidence of malignancy at that time. She also had a CBC on 10/16/2014 showed a hemoglobin of 10.4 white cell count of 4.9 and a platelet count of 95. Please see her hemoglobin have ranged between 9 and 12 and her platelet count were down as low as 60,000 at some point in June 2015. She does not report any hematochezia or bleeding per rectum. She does not report any epistaxis, hemoptysis or hematemesis. She does have history of Crohn's disease and have had oral iron in the past but she is intolerant to it.  She does not report any headaches, blurry vision, syncope or seizures. She does report unsteadiness but no falls. She does not use a walker or wheelchair for mobility. She does not report any fevers, chills, sweats appetite had been reasonable. He does not report any chest pain, palpitation she does report orthopnea and leg edema. She does not report any hemoptysis but does report productive cough and dyspnea on exertion. He does not report any nausea, vomiting or abdominal pain. She does report of the satiety and abdominal distention. She does not report any frequency, urgency or hesitancy. She does not report any skeletal complaints. Remaining review of systems unremarkable.   Past Medical History  Diagnosis Date  . Dyspnea   . Esophageal reflux   . CAD (coronary artery disease)   . Hypertension   . Crohn's disease   . Allergic rhinitis   . COPD (chronic  obstructive pulmonary disease)   . History of kidney stones   . History of shingles   . Trigeminal neuralgia   . Family history of anesthesia complication     " SISTER HAD BAD FEELINGS "  . Anginal pain   . Anxiety     ' JUST FOR MY BREATHING "  . Arthritis     mild in hands  . Hyperlipidemia   :  Past Surgical History  Procedure Laterality Date  . Coronary stents    . Lithotripsy    . Ureteral stent placement    . Cholecystectomy    . Total abdominal hysterectomy    . Partial resections large and small bowel for crohn's    . Breast lumps benign    . Left heart catheterization with coronary angiogram N/A 10/28/2011    Procedure: LEFT HEART CATHETERIZATION WITH CORONARY ANGIOGRAM;  Surgeon: Lesleigh Noe, MD;  Location: [facility] CATH LAB;  Service: Cardiovascular;  Laterality: N/A;  :   Current outpatient prescriptions:  .  albuterol (PROVENTIL HFA;VENTOLIN HFA) 108 (90 BASE) MCG/ACT inhaler, Inhale 2 puffs into the lungs daily as needed for wheezing or shortness of breath., Disp: , Rfl:  .  albuterol (PROVENTIL HFA;VENTOLIN HFA) 108 (90 BASE) MCG/ACT inhaler, Inhale 2 puffs into the lungs every 6 (six) hours as needed for wheezing or shortness of breath., Disp: 1 Inhaler, Rfl: 11 .  albuterol (PROVENTIL) (2.5 MG/3ML) 0.083% nebulizer solution, Take 3 mLs (2.5 mg total) by nebulization every 4 (four)  hours as needed. Shortness of breath, Disp: 75 mL, Rfl: 11 .  albuterol (PROVENTIL) 2 MG tablet, Take 1 tablet (2 mg total) by mouth 2 (two) times daily as needed for wheezing., Disp: 60 tablet, Rfl: 0 .  ALPRAZolam (XANAX) 0.5 MG tablet, TAKE ONE TABLET BY MOUTH THREE TIMES DAILY AS NEEDED FOR ANXIETY, Disp: 270 tablet, Rfl: 0 .  Apple Cider Vinegar 500 MG TABS, Take 1 tablet by mouth daily., Disp: , Rfl:  .  arformoterol (BROVANA) 15 MCG/2ML NEBU, Take 2 mLs (15 mcg total) by nebulization 2 (two) times daily., Disp: 60 mL, Rfl: prn .  BIOTIN 5000 PO, Take 2 tablets by mouth daily.,  Disp: , Rfl:  .  Cholecalciferol (VITAMIN D-3) 5000 UNITS TABS, Take 1 tablet by mouth daily., Disp: , Rfl:  .  Cranberry 250 MG TABS, Take 2 tablets by mouth., Disp: , Rfl:  .  cyanocobalamin (,VITAMIN B-12,) 1000 MCG/ML injection, INJECT 1 ML IM ONCE A MONTH, Disp: 3 mL, Rfl: 1 .  furosemide (LASIX) 20 MG tablet, Take 20-40 mg by mouth daily., Disp: , Rfl:  .  isosorbide mononitrate (IMDUR) 30 MG 24 hr tablet, TAKE TWO TABLETS BY MOUTH DAILY, Disp: 60 tablet, Rfl: 11 .  loratadine (CLARITIN) 10 MG tablet, Take 10 mg by mouth daily as needed. , Disp: , Rfl:  .  losartan (COZAAR) 50 MG tablet, Take 1 tablet (50 mg total) by mouth daily., Disp: 30 tablet, Rfl: 11 .  magnesium oxide (MAG-OX) 400 MG tablet, Take 400 mg by mouth daily. , Disp: , Rfl:  .  Multiple Vitamin (MULTIVITAMIN) tablet, Take 1 tablet by mouth daily. , Disp: , Rfl:  .  nitroGLYCERIN (NITROSTAT) 0.4 MG SL tablet, Place 1 tablet (0.4 mg total) under the tongue every 5 (five) minutes as needed. Chest pain, Disp: 25 tablet, Rfl: 3 .  omeprazole (PRILOSEC) 20 MG capsule, TAKE ONE CAPSULE BY MOUTH DAILY, Disp: 90 capsule, Rfl: 3 .  potassium chloride SA (K-DUR,KLOR-CON) 20 MEQ tablet, Take 1 tablet (20 mEq total) by mouth 2 (two) times daily. Alternate taking 2 one day then 1 the next, Disp: 180 tablet, Rfl: 1 .  Tiotropium Bromide-Olodaterol 2.5-2.5 MCG/ACT AERS, Inhale into the lungs., Disp: , Rfl:  .  Zinc Acetate, Oral, (ZINC ACETATE PO), Take 1 tablet by mouth daily., Disp: , Rfl: :  Allergies  Allergen Reactions  . Avelox [Moxifloxacin Hcl In Nacl] Shortness Of Breath, Swelling and Rash  . Codeine Shortness Of Breath and Other (See Comments)    Couldn't breath  . Theophyllines     Slight increased heart rate  . Ciprofloxacin Other (See Comments)    nervous  . Milk-Related Compounds Other (See Comments)    Whole milk hurts stomach. Not all dairy products do this.  . Lisinopril Rash  :  Family History  Problem Relation  Age of Onset  . Breast cancer Sister   . COPD Sister   . Stroke Mother   . Prostate cancer Father   . Prostate cancer Brother   . Heart attack Brother   :  Social History   Social History  . Marital Status: Widowed    Spouse Name: N/A  . Number of Children: N/A  . Years of Education: N/A   Occupational History  . PT [employer]    Social History Main Topics  . Smoking status: Former Smoker -- 1.00 packs/day for 30 years    Types: Cigarettes    Quit  date: 04/12/1990  . Smokeless tobacco: Never Used  . Alcohol Use: No  . Drug Use: No  . Sexual Activity: No   Other Topics Concern  . Not on file   Social History Narrative   Has a living will and HPOA- Brandi Cline.   Would desire CPR.   Would not want prolonged life support, would not want feeding tubes.        :  Pertinent items are noted in HPI.  Exam: ECOG 2 Blood pressure 144/67, pulse 77, temperature 97.7 F (36.5 C), temperature source Oral, resp. rate 18, height 5\' 3"  (1.6 m), weight 144 lb 9.6 oz (65.59 kg), SpO2 95 %. General appearance: alert and cooperative Head: Normocephalic, without obvious abnormality Throat: lips, mucosa, and tongue normal; teeth and gums normal Neck: no adenopathy Back: negative Resp: clear to auscultation bilaterally Chest wall: no tenderness Cardio: regular rate and rhythm, S1, S2 normal, no murmur, click, rub or gallop GI: soft, non-tender; bowel sounds normal; no masses,  no organomegaly Extremities: Bilateral edema noted. Pulses: 2+ and symmetric Skin: Skin color, texture, turgor normal. No rashes or lesions Lymph nodes: Cervical, supraclavicular, and axillary nodes normal.  CBC    Component Value Date/Time   WBC 4.9 10/16/2014 1039   RBC 3.41* 10/16/2014 1039   RBC 3.27* 09/21/2013 0417   HGB 10.4* 10/16/2014 1039   HCT 31.0* 10/16/2014 1039   PLT 95.0* 10/16/2014 1039   MCV 90.9 10/16/2014 1039   MCH 29.4 09/23/2013 0558   MCHC 33.5 10/16/2014 1039   RDW  16.8* 10/16/2014 1039   LYMPHSABS 0.9 10/16/2014 1039   MONOABS 0.4 10/16/2014 1039   EOSABS 0.1 10/16/2014 1039   BASOSABS 0.0 10/16/2014 1039   Results for Mckell, Brandi Cline (MRN [id number]) as of 12/04/2014 10:21  Ref. Range 09/21/2013 04:17  Iron Latest Ref Range: 42-135 ug/dL 48  UIBC Latest Ref Range: 125-400 ug/dL 213  TIBC Latest Ref Range: 250-470 ug/dL 086  Saturation Ratios Latest Ref Range: 20-55 % 18 (L)  Ferritin Latest Ref Range: 10-291 ng/mL 102  Folate Latest Units: ng/mL >20.0     Assessment and Plan:   81 year old woman with the following issues:  1. Normocytic, normochromic anemia. This has been chronic in nature and likely multifactorial. Anemia of chronic disease, renal disease and possible iron deficiency. I cannot rule out myelodysplasia is also contributing factor. I see no signs or symptoms to suggest a plasma cell disorder.  To work this up I will obtain serum protein electrophoresis, erythropoietin level and a repeat iron studies. She could get have an element of iron deficiency that could be corrected with intravenous iron. Risks and benefits of this intervention was discussed today. Complications include infusion related complications such as arthralgias, myalgias and rarely anaphylaxis.  After fixing her iron stores, growth factor support in the form of Aranesp or Procrit can be utilized.  2. Thrombocytopenia: This likely related to her liver disease and I do not think it's a sign of a blood disorder. There is no signs or symptoms of bleeding and no intervention is needed at this time. Further active surveillance will be needed.  3. Fatigue and failure to thrive: I believe this is multifactorial and not necessarily all related to her anemia. Her congestive heart failure, liver disease, kidney disease and COPD are all contributing factors.  We will repeat laboratory testing today and arrange for a follow-up depending on these results. I will set her up with iron  infusion as her iron  studies indicate that she might benefit. She will follow-up in 4 weeks in any case.

## 2014-12-04 NOTE — Progress Notes (Signed)
Checked in new patient with blood concern. Pt may not need my services at this time. Pt made pymt of $45 copay via [phone]. Receipt given.

## 2014-12-04 NOTE — Progress Notes (Signed)
Please see consult note.  

## 2014-12-05 ENCOUNTER — Telehealth: Payer: Self-pay | Admitting: *Deleted

## 2014-12-05 NOTE — Telephone Encounter (Signed)
As noted below by Dr. Clelia Croft, I informed patient that her iron studies are normal. She does not require IV iron. Other labs are pending and will be discussed at next visit. Patient verbalized understanding.

## 2014-12-05 NOTE — Telephone Encounter (Signed)
-----   Message from Benjiman Core, MD sent at 12/05/2014  9:41 AM EDT ----- Please let her know that her iron studies are normal. She will not need IV iron.  Other labs are still pending and will be discussed with her next visit.

## 2014-12-06 LAB — SPEP & IFE WITH QIG
Albumin ELP: 3.5 g/dL — ABNORMAL LOW (ref 3.8–4.8)
Alpha-1-Globulin: 0.3 g/dL (ref 0.2–0.3)
Alpha-2-Globulin: 0.7 g/dL (ref 0.5–0.9)
Beta 2: 0.4 g/dL (ref 0.2–0.5)
Beta Globulin: 0.4 g/dL (ref 0.4–0.6)
Gamma Globulin: 1 g/dL (ref 0.8–1.7)
IGA: 293 mg/dL (ref 69–380)
IGM, SERUM: 35 mg/dL — AB (ref 52–322)
IgG (Immunoglobin G), Serum: 1000 mg/dL (ref 690–1700)
Total Protein, Serum Electrophoresis: 6.3 g/dL (ref 6.1–8.1)

## 2014-12-06 LAB — ERYTHROPOIETIN: Erythropoietin: 7.6 m[IU]/mL (ref 2.6–18.5)

## 2014-12-12 ENCOUNTER — Other Ambulatory Visit: Payer: Self-pay | Admitting: Family Medicine

## 2014-12-13 NOTE — Telephone Encounter (Signed)
Medication is no longer on pts medication list. Shows d/c 06/2014

## 2014-12-23 ENCOUNTER — Other Ambulatory Visit: Payer: Self-pay | Admitting: Interventional Cardiology

## 2014-12-30 ENCOUNTER — Telehealth: Payer: Self-pay | Admitting: *Deleted

## 2014-12-30 ENCOUNTER — Other Ambulatory Visit: Payer: Self-pay | Admitting: Interventional Cardiology

## 2014-12-30 NOTE — Telephone Encounter (Signed)
Patient's daughter Dione Plover called "requesting to speak with Dr. Clelia Croft.  Mom has an appointment with him 01-01-2015 at 1:15 pm.  I need to know what his plans are.  Is there anything he can do to help her?"  Asked if she can come with mom for this visit as the plan will be discussed at this time.

## 2015-01-01 ENCOUNTER — Ambulatory Visit (HOSPITAL_BASED_OUTPATIENT_CLINIC_OR_DEPARTMENT_OTHER): Payer: Medicare HMO | Admitting: Oncology

## 2015-01-01 ENCOUNTER — Telehealth: Payer: Self-pay | Admitting: Oncology

## 2015-01-01 VITALS — BP 154/45 | HR 70 | Temp 97.7°F | Resp 18 | Ht 63.0 in | Wt 138.9 lb

## 2015-01-01 DIAGNOSIS — D631 Anemia in chronic kidney disease: Secondary | ICD-10-CM | POA: Diagnosis not present

## 2015-01-01 DIAGNOSIS — N189 Chronic kidney disease, unspecified: Secondary | ICD-10-CM | POA: Diagnosis not present

## 2015-01-01 NOTE — Progress Notes (Signed)
Hematology and Oncology Follow Up Visit  Brandi Cline [id number] 07/16/1930 81 y.o. 01/01/2015 2:01 PM Brandi Cline, MDAron, Brandi Gulling, MD   Brandi Cline Diagnosis:  81 year old woman with anemia of renal disease. She has also anemia of chronic disease diagnosed in August 2016 after presenting with a hemoglobin of 9.2.  Her creatinine was 2.6 with creatinine clearance of 16 mL/m.   Current therapy:  Under evaluation for growth factor support.  Interim History:   Brandi Cline presents today for a follow-up visit. She is a pleasant woman I saw in consultation on 12/04/2014 for evaluation of anemia. Her laboratory workup obtained at that time showed that her hemoglobin was around 9.2 with severe renal insufficiency but no other abnormalities. Since the last visit, she continues to complain of fatigue and tiredness. She does report shortness of breath and dyspnea on exertion but she has history of COPD and currently oxygen dependent. She has not reported any bleeding episodes or pathological fractures.   She does not report any headaches, blurry vision, syncope or seizures. She does report unsteadiness but no falls. She does not report any fevers, chills, sweats appetite had been reasonable. He does not report any chest pain, palpitation she does report orthopnea and leg edema. She does not report any hemoptysis. He does not report any nausea, vomiting or abdominal pain. She does report of the satiety and abdominal distention. She does not report any frequency, urgency or hesitancy. She does not report any skeletal complaints. Remaining review of systems unremarkable.     Medications: I have reviewed the patient's current medications.    Current Outpatient Prescriptions  Medication Sig Dispense Refill  . albuterol (PROVENTIL HFA;VENTOLIN HFA) 108 (90 BASE) MCG/ACT inhaler Inhale 2 puffs into the lungs daily as needed for wheezing or shortness of breath.    [name] albuterol (PROVENTIL HFA;VENTOLIN HFA) 108 (90  BASE) MCG/ACT inhaler Inhale 2 puffs into the lungs every 6 (six) hours as needed for wheezing or shortness of breath. 1 Inhaler 11  . albuterol (PROVENTIL) (2.5 MG/3ML) 0.083% nebulizer solution Take 3 mLs (2.5 mg total) by nebulization every 4 (four) hours as needed. Shortness of breath 75 mL 11  . albuterol (PROVENTIL) 2 MG tablet Take 1 tablet (2 mg total) by mouth 2 (two) times daily as needed for wheezing. 60 tablet 0  . ALPRAZolam (XANAX) 0.5 MG tablet TAKE ONE TABLET BY MOUTH THREE TIMES DAILY AS NEEDED FOR ANXIETY 270 tablet 0  . Apple Cider Vinegar 500 MG TABS Take 1 tablet by mouth daily.    [name] arformoterol (BROVANA) 15 MCG/2ML NEBU Take 2 mLs (15 mcg total) by nebulization 2 (two) times daily. 60 mL prn  . BIOTIN 5000 PO Take 2 tablets by mouth daily.    . Cholecalciferol (VITAMIN D-3) 5000 UNITS TABS Take 1 tablet by mouth daily.    . Cranberry 250 MG TABS Take 2 tablets by mouth.    . cyanocobalamin (,VITAMIN B-12,) 1000 MCG/ML injection INJECT 1 ML IM ONCE A MONTH 3 mL 1  . furosemide (LASIX) 20 MG tablet Take 20-40 mg by mouth daily.    . isosorbide mononitrate (IMDUR) 30 MG 24 hr tablet TAKE TWO TABLETS BY MOUTH DAILY 60 tablet 11  . loratadine (CLARITIN) 10 MG tablet Take 10 mg by mouth daily as needed.     [name] losartan (COZAAR) 50 MG tablet Take 1 tablet (50 mg total) by mouth daily. 30 tablet 11  . losartan (COZAAR) 50 MG tablet TAKE 1 TABLET  BY MOUTH DAILY 90 tablet 2  . magnesium oxide (MAG-OX) 400 MG tablet Take 400 mg by mouth daily.     . Multiple Vitamin (MULTIVITAMIN) tablet Take 1 tablet by mouth daily.     . nitroGLYCERIN (NITROSTAT) 0.4 MG SL tablet Place 1 tablet (0.4 mg total) under the tongue every 5 (five) minutes as needed. Chest pain 25 tablet 3  . omeprazole (PRILOSEC) 20 MG capsule TAKE ONE CAPSULE BY MOUTH DAILY 90 capsule 3  . potassium chloride SA (K-DUR,KLOR-CON) 20 MEQ tablet Take 1 tablet (20 mEq total) by mouth 2 (two) times daily. Alternate taking 2 one  day then 1 the next 180 tablet 1  . Zinc Acetate, Oral, (ZINC ACETATE PO) Take 1 tablet by mouth daily.     No current facility-administered medications for this visit.     Allergies:  Allergies  Allergen Reactions  . Avelox [Moxifloxacin Hcl In Nacl] Shortness Of Breath, Swelling and Rash  . Codeine Shortness Of Breath and Other (See Comments)    Couldn't breath  . Theophyllines     Slight increased heart rate  . Ciprofloxacin Other (See Comments)    nervous  . Milk-Related Compounds Other (See Comments)    Whole milk hurts stomach. Not all dairy products do this.  . Lisinopril Rash    Past Medical History, Surgical history, Social history, and Family History were reviewed and updated.   Physical Exam: Blood pressure 154/45, pulse 70, temperature 97.7 F (36.5 C), temperature source Oral, resp. rate 18, height 5\' 3"  (1.6 m), weight 138 lb 14.4 oz (63.005 kg), SpO2 97 %. ECOG: 2 General appearance: alert and cooperative  Elderly woman appeared in no distress. She has oxygen on. Head: Normocephalic, without obvious abnormality Neck: no adenopathy and no carotid bruit Lymph nodes: Cervical, supraclavicular, and axillary nodes normal. Heart:regular rate and rhythm, S1, S2 normal, no murmur, click, rub or gallop Lung:chest clear, rales, normal symmetric air entry expiratory wheezes noted. Abdomin: soft, non-tender, without masses or organomegaly EXT:no erythema, induration, or nodules edema noted bilaterally.   Lab Results: Lab Results  Component Value Date   WBC 4.0 12/04/2014   HGB 9.2* 12/04/2014   HCT 27.7* 12/04/2014   MCV 90.5 12/04/2014   PLT 72* 12/04/2014     Chemistry      Component Value Date/Time   NA 142 12/04/2014 1120   NA 142 10/16/2014 1039   K 5.2* 12/04/2014 1120   K 3.6 10/16/2014 1039   CL 108 10/16/2014 1039   CO2 19* 12/04/2014 1120   CO2 24 10/16/2014 1039   BUN 42.8* 12/04/2014 1120   BUN 16 10/16/2014 1039   CREATININE 2.6* 12/04/2014  1120   CREATININE 1.28* 10/16/2014 1039      Component Value Date/Time   CALCIUM 8.5 12/04/2014 1120   CALCIUM 8.6 10/16/2014 1039   ALKPHOS 187* 12/04/2014 1120   ALKPHOS 171* 10/16/2014 1039   AST 38* 12/04/2014 1120   AST 46* 10/16/2014 1039   ALT 30 12/04/2014 1120   ALT 31 10/16/2014 1039   BILITOT 1.38* 12/04/2014 1120   BILITOT 2.2* 10/16/2014 1039      Results for Cline, Brandi Cline (MRN [id number]) as of 01/01/2015 14:02  Ref. Range 12/04/2014 11:20  Albumin ELP Latest Ref Range: 3.8-4.8 g/dL 3.5 (L)  COMMENT (PROTEIN ELECTROPHOR) Unknown *  Alpha-1 Glubulin Latest Ref Range: 0.2-0.3 g/dL 0.3  Alpha-2 Globulin Latest Ref Range: 0.5-0.9 g/dL 0.7  Beta Globulin Latest Ref Range: 0.4-0.6 g/dL 0.4  Beta 2 Latest Ref Range: 0.2-0.5 g/dL 0.4  Gamma Globulin Latest Ref Range: 0.8-1.7 g/dL 1.0  SPE Interp. Unknown *  IgG (Immunoglobin G), Serum Latest Ref Range: [phone] mg/dL 4098  IgA Latest Ref Range: 69-380 mg/dL 119  IgM, Serum Latest Ref Range: 52-322 mg/dL 35 (L)  Total Protein, Serum Electrophoresis Latest Ref Range: 6.1-8.1 g/dL 6.3   Results for Brandi Cline, Brandi Cline (MRN [id number]) as of 01/01/2015 14:02  Ref. Range 12/04/2014 11:20  Erythropoietin Latest Ref Range: 2.6-18.5 mIU/mL 7.6    Impression and Plan:    81 year old woman with the following issues:  1.  Normocytic normochromic anemia noted at least for the last year and a half with a hemoglobin ranging between 9 and 10. Her workup  Was done in August 2016 was reviewed today. She has no evidence of a plasma cell disorder or iron deficiency. Her creatinine is abnormal with a creatinine clearance of less than 20 mL/m. Her EPO level is inappropriately normal. The most likely etiology of her anemia is related to anemia of renal disease may be anemia of chronic disease as well. It is possible she could have early myelodysplasia but this is less likely.   From a management standpoint, I think she will benefit from growth  factor support. That could  Be given in the form of Aranesp or Procrit. Risks and benefits of these strokes were reviewed today. Complications include hypertension and injection-related problems and rarely thrombosis. She is agreeable to proceed but she is unsure about the logistics of traveling I would like to get this done closer to home in [place]. She was wondering if this can be done with her nephrologist and she is inquiring about that.   In the meantime I will schedule her to get that done  In [place] and certainly these appointments can be canceled if he can be done closer to home.    2. Renal insufficiency: followed by nephrology in [place]. Her creatinine clearance is close to 20 mL/m.   3. Thrombocytopenia: I'll likely related to a blood disorder and may be related to history of liver disease.   4. Follow-up: Will be in 9 weeks to assess her response to therapy.  [facility], MD 9/21/20162:01 PM

## 2015-01-01 NOTE — Telephone Encounter (Signed)
Gave adn printed appt sched and avs for pt for Sept thru NOV °

## 2015-01-03 ENCOUNTER — Ambulatory Visit: Payer: Medicare HMO | Admitting: Gastroenterology

## 2015-01-06 ENCOUNTER — Encounter: Payer: Self-pay | Admitting: Internal Medicine

## 2015-01-06 ENCOUNTER — Ambulatory Visit (INDEPENDENT_AMBULATORY_CARE_PROVIDER_SITE_OTHER): Payer: Medicare HMO | Admitting: Internal Medicine

## 2015-01-06 VITALS — BP 130/52 | HR 71 | Ht 65.0 in | Wt 135.8 lb

## 2015-01-06 DIAGNOSIS — J432 Centrilobular emphysema: Secondary | ICD-10-CM

## 2015-01-06 DIAGNOSIS — Z23 Encounter for immunization: Secondary | ICD-10-CM | POA: Diagnosis not present

## 2015-01-06 DIAGNOSIS — J9611 Chronic respiratory failure with hypoxia: Secondary | ICD-10-CM | POA: Diagnosis not present

## 2015-01-06 DIAGNOSIS — I5032 Chronic diastolic (congestive) heart failure: Secondary | ICD-10-CM

## 2015-01-06 NOTE — Patient Instructions (Signed)
Flu vax  Order- DME [facility]  Change home stationary O2 concentrator to 10 L  Capacity to allow up to 7l/m flow                      O2 sat at this office today 86% on 4L pulse                 Dx  Chronic respiratory failure with hypoxia

## 2015-01-06 NOTE — Assessment & Plan Note (Signed)
Plan-flu vaccine, continue meds

## 2015-01-06 NOTE — Assessment & Plan Note (Signed)
Peripheral edema reflects cor pulmonale but also her heart failure.

## 2015-01-06 NOTE — Assessment & Plan Note (Signed)
86% saturation on 4 L pulse oxygen on arrival, improving at rest. We discussed goals of home oxygen. Plan-asked DME to give her a larger stationary concentrator which can provide 7 L through her long oxygen tubing around her house.

## 2015-01-06 NOTE — Progress Notes (Signed)
Subjective:    Patient ID: Brandi Cline, female    DOB: 10-09-1930, 81 y.o.   MRN: [id number] Acute visit- Dr Shelle Iron 09/28/11- The patient comes in today for an acute sick visit.  She has known significant COPD, and is usually followed by Dr. [name].  She gives a 2 to three-day history of increasing shortness of breath, and this culminated in severe shortness of breath this morning.  She is currently a little better after using her morning bronchodilators and her neb treatment.  She has no significant cough, mucus, or congestion.  She does feel a chest heaviness, especially on the left side.  She has a history of coronary disease with stents, but thinks that chest discomfort is different from her usual angina.  Surprisingly, her oxygen saturations today are excellent.  12/06/11 - 80 yoF followed for COPD, complicated by allergic rhinitis, GERD, Crohns Disease, CAD/ stents Patient states had pneumonia/ hosp/ heart cath( ok w/ no new stents) in June. States breathing is a little worse since last office visit. States wears oxygen on 2L as needed. c/o sob, wheezing, and chest tightness every now and then. Denies chest pain and cough. Occasional scant clear mucus. Denies chest pain. Notices dyspnea mainly with exertion, worse in humid weather. CXR 10/08/11-reviewed with her IMPRESSION:  COPD/chronic changes. No active disease.  Original Report Authenticated By: [name], M.D.   03/17/12- 80 yoF followed for COPD, complicated by allergic rhinitis, GERD, Crohns Disease, CAD/ stents FOLLOWS FOR: feels like symibcort and Spiriva not helping anymore-would like something different and cheaper Sister here 2 URI's since August treated by PCP. Needed prednisone.  Feels Symbicort wear off before 12 hour next dose. Wearing O2 more and using neb twice daily.  Little cough- scant clear mucus. Aware of angina despite isorbide. Feet swell- varies, not new or progressive. CXR 10/07/11- COPD  04/27/12-  81 yoF  followed for COPD, complicated by allergic rhinitis, GERD, Crohns Disease, CAD/ stents FOLLOWS FOR: Increased SOB today-unsure of cause(used nebulizer before OV-not much  difference), did not hear any wheezing. Denies any cough or chest congestion Often feels tight at night. Aware of increased fluid retention occasionally. Denies chest pain, infection, wheeze or cough. Last visit we gave sample Breo ellipta, which she blames for fluid retention. Continues oxygen 2 L/Advanced for sleep and as needed  12/19/12- 81 yoF former smoker followed for COPD, complicated by allergic rhinitis, GERD, Crohns Disease, CAD/ stents     Family here. ACUTE:  Increased SOB, tightness that travels around to back and some wheezing.  Sore throat in am and  scratchy all day Continues oxygen 3 L/Advanced for sleep and as needed. She has been more dyspneic over past year. We reviewed Chest CT from the spring, noting it was done because of dyspnea then. Hosp 3/26-07/12/12 for AECOPD with NAD on CXR.  Now 3-4 weeks variable hoarseness- on magic mouthwash and prednisone taper. Some difficulty swallowing.  No cough, scant deep yellow sputum, some tight. No pain, blood, fever, palpitation. Easy DOE needing 3LO2 rest, 4-5L exertion in home. CT chest 07/05/12 IMPRESSION:  . Stable hyperinflation and chronic interstitial prominence. Again  noted left basilar scarring. No acute infiltrate or pulmonary  edema.  Original Report Authenticated By: [name], M.D. CXR 07/10/12 IMPRESSION:  No acute cardiopulmonary abnormality seen. Chronic findings as  described above.  Original Report Authenticated By: [name]., M.D.  01/30/13- 28 yoF former smoker followed for COPD, complicated by allergic rhinitis, GERD, Crohns Disease, CAD/ stents  Family here. FOLLOWS FOR: "weak as water"; swelling in feet, staying tired. Good and bad days Weight gain 10 pounds in the past month. Ankles swell. Little cough, scant yellow. Pain mid  thoracic spine. Continues O2 4L/ Advanced. Theophylline was no help and caused palpitations, so quit. Last prednisone 2 weeks ago. ENT/Dr. [name] saw her in September and diagnosed esophageal reflux and thrush. He doubled her acid blocker.  03/20/13- 26 yoF former smoker followed for COPD, complicated by allergic rhinitis, GERD, Crohns Disease, CAD/ stents   Daughter here FOLLOWS FOR: Breathing is unchanged. Reports SOB and coughing. Denies chest tightness or wheezing.   Dr. Kevan Ny is treating with Lasix. She is off of prednisone. Morning cough with clear mucus. Using Flutter device for pulmonary toilet. Medications discussed. Hosp for AECOPD 05/13/12- 05/15/12 CXR 01/30/13 IMPRESSION:  Bibasilar linear opacities, consistent with atelectasis or scarring.  Emphysematous changes.  Electronically Signed  By: [name] M.D.  On: 01/30/2013 16:54  07/03/13- 73 yoF former smoker followed for COPD, complicated by allergic rhinitis, GERD, Crohns Disease, CAD/ stents   Daughter here FOLLOWS FOR: Symbicort works well for patient about 4-5 hours and then uses rescue inhaler; using neb treatments as needed; but unsure if Spiriva is actually helping. Wonders if she can try [name] again as she has had past 2 days without any side effects. Now finishing prednisone and Z-Pak from primary physician. Denies any benefit from [facility]. Using either nebulizer or Proair once or twice daily.  09/18/13- 68 yoF former smoker followed for COPD, complicated by allergic rhinitis, GERD, Crohns Disease, CAD/ stents   Daughter here FOLLOWS FOR: Pt states breathing has worsened since last OV. Pt states the theophyillne isnt helping. Pt states she has an increase in SOB. C/o mild productive cough with clear mucus and chest tightness with deep breaths.   10/30/13- 82 yoF former smoker followed for COPD/ E, complicated by allergic rhinitis, GERD, Crohns Disease, CAD/ stents   Daughter here FOLLOWS FOR: Using up to 5l/m cont  at home; having a hard time breathing-heat gets her worse.  CT chest 09/19/13 IMPRESSION:  No evidence of significant pulmonary embolus. Extensive  emphysematous changes in the lungs.  Electronically Signed  By: Burman Nieves M.D.  On: 09/19/2013 22:10  01/01/14- 82 yoF former smoker followed for COPD/ E, complicated by allergic rhinitis, GERD, Crohns Disease, CAD/ stents   Daughter here FOLLOWS FOR: continues ot use 5L/M cont at home and 3L/m pulse when out; DME is [facility]. Could tell that Anoro helped better than Symbicort. Anoro is not on her Formulary list through insurance. Never really cleared the cough and yellow sputum after bronchitis in December 05/02/14- 83 yoF former smoker followed for COPD/ E, complicated by allergic rhinitis, GERD, Crohns Disease, CAD/ stents   Daughter here  FOLLOWS FOR: has noticed she has to wear O2 more during the day at 5.5L now.  Pt would like to discuss other options for meds as she feels the Anoro  and albuterol is no longer holding her breathing stable enough.  Asks help for a tier reduction on Symbicort. I suggested we try alternatives first. She wants medications that last longer between doses and feels her heart rhythm is controlled.. She wanted to try Symbicort 3 times daily. CXR 04/10/14 IMPRESSION: 1. Acute cardiopulmonary disease.- should read "NO acute pulmonary disease  -CDY 2. Pleural parenchymal scarring . Electronically Signed  By: Maisie Fus Register  On: 04/10/2014 11:53  07/01/14- 28 yoF former smoker followed for COPD/ E, complicated by  allergic rhinitis, GERD, Crohns Disease, CAD/ stents   Daughter here FOLLOW FOR:  COPD; lot of tightness in chest, coughing up a lot of clear mucus, sometimes yellow.  having to use rescue inhaler daily Hard cough x 3-4 days. No fever.  09/02/14- 76 yoF former smoker followed for COPD/ E, complicated by allergic rhinitis, GERD, Crohns Disease, CAD/ stents   Daughter here Reports: medication refill and wants  to stick with the Brovana. Prescription order is coming through [facility]. Also has an Anoro inhaler which I gave in an effort to stabilize her bronchodilator control around-the-clock. She has had 2 episodes that sound like self-limited palpitations, which she attributes to Anoro. Neither albuterol rescue inhaler or nebulizer with albuterol helped her. She does use Flutter and Incentive Spirometer daily. O2 6L [facility] with home concentrator, 3 L portable.  10/03/14- 53 yoF former smoker followed for COPD/ E, complicated by allergic rhinitis, GERD, Crohns Disease, CAD/ stents, chronic liver disease   Daughter here FOLLOWS FOR: Pt feels she needs to increase her O2 to 4L/M when ambulating and 3L/M at rest. DME is [facility]. Pt states she "loved" the Brovana nebulizer tx's and sprivia respimat-but has noticed swelling in feet, ankles, and going up the leg. Weight down 1 lb since last ov 5/23. Platelets 94 K in March. Bilateral ankle edema left greater than right in the past week with tight feeling but no real pain. Reports Spiriva no help. Questions if Brovana nebulizer solution is causing the edema. No bleeding. Not clear why platelet count was low in March with liver enzymes increased.  01/06/15- 75 yoF former smoker followed for COPD/ E, complicated by allergic rhinitis, GERD, Crohns Disease, CAD/ stents, chronic liver disease   Daughter here O2 6L [facility] with home concentrator, 3 L portable. FOLLOWS FOR Pt feels the need to increase her O2. Pt states that she needs to use 7L to feel that she is able to catch her breath. Pt still using Brovana and spiriva respimat.  We discussed impact of anemia for which she is being treated. Her hemoglobin of 9 is low enough to begin contributing to dyspnea. [employer], therapeutic form completed for her.  ROS-see HPI Constitutional:   No-   weight loss, night sweats, fevers, chills, fatigue, lassitude. HEENT:   No-  headaches, +difficulty swallowing, tooth/dental  problems, +sore throat,       No-  sneezing, itching, ear ache, nasal congestion, post nasal drip,  CV:  No-   chest pain, orthopnea, PND, +swelling in lower extremities, anasarca, dizziness, +palpitations Resp: +  shortness of breath with exertion or at rest.             +productive cough, no- non-productive cough,  No- coughing up of blood.              No- change in color of mucus.  No- wheezing.   Skin: No-rash or lesions. GI:  + heartburn, indigestion, no-abdominal pain, nausea, vomiting,  GU:  MS:  No-   joint pain or swelling.  + back pain Neuro-     nothing unusual Psych:  No- change in mood or affect. No depression or anxiety.  No memory loss.  Objective:  OBJ- Physical Exam General- Alert, Oriented, Affect-appropriate, Distress- none acute, wheelchair, O2  4L portable here/86% sat on arrival Skin- rash-none, lesions- none, excoriation- none Lymphadenopathy- none Head- atraumatic            Eyes- Gross vision intact, PERRLA, conjunctivae and secretions clear  Ears- +Hearing aids            Nose- Clear, no-Septal dev, mucus, polyps, erosion, perforation             Throat- Mallampati II , mucosa clear , drainage- none, tonsils- atrophic.  Neck- flexible , trachea midline, no stridor , thyroid nl, carotid no bruit Chest - symmetrical excursion , unlabored           Heart/CV- RRR , no murmur , no gallop  , no rub, nl s1 s2                           - JVD-+ 1, edema +2 R>L, stasis changes- none, varices- none           Lung- decreased breath sounds, + few crackles, wheeze + trace,                   cough-none ,  dullness-none, rub-none           Chest wall-  Abd-  Br/ Gen/ Rectal- Not done, not indicated Extrem- cyanosis- none, clubbing, none, atrophy- none, strength- nl.               Neuro- grossly intact to observation  Assessment & Plan:

## 2015-01-08 ENCOUNTER — Ambulatory Visit (HOSPITAL_BASED_OUTPATIENT_CLINIC_OR_DEPARTMENT_OTHER): Payer: Medicare HMO

## 2015-01-08 ENCOUNTER — Other Ambulatory Visit (HOSPITAL_BASED_OUTPATIENT_CLINIC_OR_DEPARTMENT_OTHER): Payer: Medicare HMO

## 2015-01-08 VITALS — BP 131/44 | HR 67 | Temp 97.6°F

## 2015-01-08 DIAGNOSIS — N189 Chronic kidney disease, unspecified: Secondary | ICD-10-CM | POA: Diagnosis not present

## 2015-01-08 DIAGNOSIS — D631 Anemia in chronic kidney disease: Secondary | ICD-10-CM | POA: Diagnosis not present

## 2015-01-08 LAB — CBC WITH DIFFERENTIAL/PLATELET
BASO%: 0.3 % (ref 0.0–2.0)
Basophils Absolute: 0 10*3/uL (ref 0.0–0.1)
EOS%: 2 % (ref 0.0–7.0)
Eosinophils Absolute: 0.1 10*3/uL (ref 0.0–0.5)
HCT: 26.5 % — ABNORMAL LOW (ref 34.8–46.6)
HGB: 8.7 g/dL — ABNORMAL LOW (ref 11.6–15.9)
LYMPH#: 0.8 10*3/uL — AB (ref 0.9–3.3)
LYMPH%: 16.1 % (ref 14.0–49.7)
MCH: 29.8 pg (ref 25.1–34.0)
MCHC: 32.7 g/dL (ref 31.5–36.0)
MCV: 90.9 fL (ref 79.5–101.0)
MONO#: 0.5 10*3/uL (ref 0.1–0.9)
MONO%: 9.7 % (ref 0.0–14.0)
NEUT%: 71.9 % (ref 38.4–76.8)
NEUTROS ABS: 3.4 10*3/uL (ref 1.5–6.5)
PLATELETS: 68 10*3/uL — AB (ref 145–400)
RBC: 2.91 10*6/uL — AB (ref 3.70–5.45)
RDW: 14.6 % — ABNORMAL HIGH (ref 11.2–14.5)
WBC: 4.7 10*3/uL (ref 3.9–10.3)

## 2015-01-08 MED ORDER — DARBEPOETIN ALFA 300 MCG/0.6ML IJ SOSY
300.0000 ug | PREFILLED_SYRINGE | Freq: Once | INTRAMUSCULAR | Status: AC
  Administered 2015-01-08: 300 ug via SUBCUTANEOUS
  Filled 2015-01-08: qty 0.6

## 2015-01-08 NOTE — Patient Instructions (Signed)
Darbepoetin Alfa injection What is this medicine? DARBEPOETIN ALFA (dar be POE e tin AL fa) helps your body make more red blood cells. It is used to treat anemia caused by chronic kidney failure and chemotherapy. This medicine may be used for other purposes; ask your health care provider or pharmacist if you have questions. COMMON BRAND NAME(S): Aranesp What should I tell my health care provider before I take this medicine? They need to know if you have any of these conditions: -blood clotting disorders or history of blood clots -cancer patient not on chemotherapy -cystic fibrosis -heart disease, such as angina, heart failure, or a history of a heart attack -hemoglobin level of 12 g/dL or greater -high blood pressure -low levels of folate, iron, or vitamin B12 -seizures -an unusual or allergic reaction to darbepoetin, erythropoietin, albumin, hamster proteins, latex, other medicines, foods, dyes, or preservatives -pregnant or trying to get pregnant -breast-feeding How should I use this medicine? This medicine is for injection into a vein or under the skin. It is usually given by a health care professional in a hospital or clinic setting. If you get this medicine at home, you will be taught how to prepare and give this medicine. Do not shake the solution before you withdraw a dose. Use exactly as directed. Take your medicine at regular intervals. Do not take your medicine more often than directed. It is important that you put your used needles and syringes in a special sharps container. Do not put them in a trash can. If you do not have a sharps container, call your pharmacist or healthcare provider to get one. Talk to your pediatrician regarding the use of this medicine in children. While this medicine may be used in children as young as 1 year for selected conditions, precautions do apply. Overdosage: If you think you have taken too much of this medicine contact a poison control center or  emergency room at once. NOTE: This medicine is only for you. Do not share this medicine with others. What if I miss a dose? If you miss a dose, take it as soon as you can. If it is almost time for your next dose, take only that dose. Do not take double or extra doses. What may interact with this medicine? Do not take this medicine with any of the following medications: -epoetin alfa This list may not describe all possible interactions. Give your health care provider a list of all the medicines, herbs, non-prescription drugs, or dietary supplements you use. Also tell them if you smoke, drink alcohol, or use illegal drugs. Some items may interact with your medicine. What should I watch for while using this medicine? Visit your prescriber or health care professional for regular checks on your progress and for the needed blood tests and blood pressure measurements. It is especially important for the doctor to make sure your hemoglobin level is in the desired range, to limit the risk of potential side effects and to give you the best benefit. Keep all appointments for any recommended tests. Check your blood pressure as directed. Ask your doctor what your blood pressure should be and when you should contact him or her. As your body makes more red blood cells, you may need to take iron, folic acid, or vitamin B supplements. Ask your doctor or health care provider which products are right for you. If you have kidney disease continue dietary restrictions, even though this medication can make you feel better. Talk with your doctor or health   care professional about the foods you eat and the vitamins that you take. What side effects may I notice from receiving this medicine? Side effects that you should report to your doctor or health care professional as soon as possible: -allergic reactions like skin rash, itching or hives, swelling of the face, lips, or tongue -breathing problems -changes in vision -chest  pain -confusion, trouble speaking or understanding -feeling faint or lightheaded, falls -high blood pressure -muscle aches or pains -pain, swelling, warmth in the leg -rapid weight gain -severe headaches -sudden numbness or weakness of the face, arm or leg -trouble walking, dizziness, loss of balance or coordination -seizures (convulsions) -swelling of the ankles, feet, hands -unusually weak or tired Side effects that usually do not require medical attention (report to your doctor or health care professional if they continue or are bothersome): -diarrhea -fever, chills (flu-like symptoms) -headaches -nausea, vomiting -redness, stinging, or swelling at site where injected This list may not describe all possible side effects. Call your doctor for medical advice about side effects. You may report side effects to FDA at 1-800-FDA-1088. Where should I keep my medicine? Keep out of the reach of children. Store in a refrigerator between 2 and 8 degrees C (36 and 46 degrees F). Do not freeze. Do not shake. Throw away any unused portion if using a single-dose vial. Throw away any unused medicine after the expiration date. NOTE: This sheet is a summary. It may not cover all possible information. If you have questions about this medicine, talk to your doctor, pharmacist, or health care provider.  2015, Elsevier/Gold Standard. (2008-03-12 10:23:57)  

## 2015-01-09 ENCOUNTER — Other Ambulatory Visit
Admission: RE | Admit: 2015-01-09 | Discharge: 2015-01-09 | Disposition: A | Discharge status: Home / Self Care | Payer: Medicare HMO | Source: Ambulatory Visit | Attending: Nephrology | Admitting: Nephrology

## 2015-01-09 DIAGNOSIS — N179 Acute kidney failure, unspecified: Secondary | ICD-10-CM | POA: Diagnosis present

## 2015-01-09 LAB — RENAL FUNCTION PANEL
ALBUMIN: 3.5 g/dL (ref 3.5–5.0)
Anion gap: 12 (ref 5–15)
BUN: 48 mg/dL — AB (ref 6–20)
CO2: 17 mmol/L — AB (ref 22–32)
Calcium: 7.4 mg/dL — ABNORMAL LOW (ref 8.9–10.3)
Chloride: 95 mmol/L — ABNORMAL LOW (ref 101–111)
Creatinine, Ser: 5.34 mg/dL — ABNORMAL HIGH (ref 0.44–1.00)
GFR calc Af Amer: 8 mL/min — ABNORMAL LOW (ref >60)
GFR calc non Af Amer: 7 mL/min — ABNORMAL LOW (ref >60)
GLUCOSE: 146 mg/dL — AB (ref 65–99)
PHOSPHORUS: 7.5 mg/dL — AB (ref 2.5–4.6)
POTASSIUM: 3.1 mmol/L — AB (ref 3.5–5.1)
SODIUM: 124 mmol/L — AB (ref 135–145)

## 2015-01-10 ENCOUNTER — Inpatient Hospital Stay
Admission: AD | Admit: 2015-01-10 | Discharge: 2015-01-15 | DRG: 683 | Disposition: A | Discharge status: Home Health | Payer: Medicare HMO | Source: Ambulatory Visit | Attending: Internal Medicine | Admitting: Internal Medicine

## 2015-01-10 ENCOUNTER — Encounter: Payer: Self-pay | Admitting: Internal Medicine

## 2015-01-10 DIAGNOSIS — E785 Hyperlipidemia, unspecified: Secondary | ICD-10-CM | POA: Diagnosis present

## 2015-01-10 DIAGNOSIS — N19 Unspecified kidney failure: Secondary | ICD-10-CM

## 2015-01-10 DIAGNOSIS — K219 Gastro-esophageal reflux disease without esophagitis: Secondary | ICD-10-CM | POA: Diagnosis present

## 2015-01-10 DIAGNOSIS — D61818 Other pancytopenia: Secondary | ICD-10-CM | POA: Diagnosis present

## 2015-01-10 DIAGNOSIS — Z881 Allergy status to other antibiotic agents status: Secondary | ICD-10-CM | POA: Diagnosis not present

## 2015-01-10 DIAGNOSIS — M13849 Other specified arthritis, unspecified hand: Secondary | ICD-10-CM | POA: Diagnosis present

## 2015-01-10 DIAGNOSIS — Z91048 Other nonmedicinal substance allergy status: Secondary | ICD-10-CM

## 2015-01-10 DIAGNOSIS — Z91011 Allergy to milk products: Secondary | ICD-10-CM | POA: Diagnosis not present

## 2015-01-10 DIAGNOSIS — Z955 Presence of coronary angioplasty implant and graft: Secondary | ICD-10-CM | POA: Diagnosis not present

## 2015-01-10 DIAGNOSIS — I2510 Atherosclerotic heart disease of native coronary artery without angina pectoris: Secondary | ICD-10-CM | POA: Diagnosis present

## 2015-01-10 DIAGNOSIS — Z9049 Acquired absence of other specified parts of digestive tract: Secondary | ICD-10-CM | POA: Diagnosis not present

## 2015-01-10 DIAGNOSIS — T502X5A Adverse effect of carbonic-anhydrase inhibitors, benzothiadiazides and other diuretics, initial encounter: Secondary | ICD-10-CM | POA: Diagnosis present

## 2015-01-10 DIAGNOSIS — E860 Dehydration: Secondary | ICD-10-CM | POA: Diagnosis present

## 2015-01-10 DIAGNOSIS — Z79899 Other long term (current) drug therapy: Secondary | ICD-10-CM

## 2015-01-10 DIAGNOSIS — E871 Hypo-osmolality and hyponatremia: Secondary | ICD-10-CM | POA: Diagnosis present

## 2015-01-10 DIAGNOSIS — Z885 Allergy status to narcotic agent status: Secondary | ICD-10-CM

## 2015-01-10 DIAGNOSIS — N184 Chronic kidney disease, stage 4 (severe): Secondary | ICD-10-CM | POA: Diagnosis present

## 2015-01-10 DIAGNOSIS — N170 Acute kidney failure with tubular necrosis: Principal | ICD-10-CM | POA: Diagnosis present

## 2015-01-10 DIAGNOSIS — K5090 Crohn's disease, unspecified, without complications: Secondary | ICD-10-CM | POA: Diagnosis present

## 2015-01-10 DIAGNOSIS — N179 Acute kidney failure, unspecified: Secondary | ICD-10-CM | POA: Diagnosis present

## 2015-01-10 DIAGNOSIS — Z886 Allergy status to analgesic agent status: Secondary | ICD-10-CM | POA: Diagnosis not present

## 2015-01-10 DIAGNOSIS — J449 Chronic obstructive pulmonary disease, unspecified: Secondary | ICD-10-CM | POA: Diagnosis present

## 2015-01-10 DIAGNOSIS — F419 Anxiety disorder, unspecified: Secondary | ICD-10-CM | POA: Diagnosis present

## 2015-01-10 DIAGNOSIS — E876 Hypokalemia: Secondary | ICD-10-CM | POA: Diagnosis present

## 2015-01-10 DIAGNOSIS — I509 Heart failure, unspecified: Secondary | ICD-10-CM | POA: Diagnosis present

## 2015-01-10 DIAGNOSIS — K7460 Unspecified cirrhosis of liver: Secondary | ICD-10-CM | POA: Diagnosis present

## 2015-01-10 DIAGNOSIS — Z888 Allergy status to other drugs, medicaments and biological substances status: Secondary | ICD-10-CM

## 2015-01-10 DIAGNOSIS — E874 Mixed disorder of acid-base balance: Secondary | ICD-10-CM | POA: Diagnosis present

## 2015-01-10 DIAGNOSIS — J9610 Chronic respiratory failure, unspecified whether with hypoxia or hypercapnia: Secondary | ICD-10-CM | POA: Diagnosis present

## 2015-01-10 DIAGNOSIS — I130 Hypertensive heart and chronic kidney disease with heart failure and stage 1 through stage 4 chronic kidney disease, or unspecified chronic kidney disease: Secondary | ICD-10-CM | POA: Diagnosis present

## 2015-01-10 DIAGNOSIS — Z9981 Dependence on supplemental oxygen: Secondary | ICD-10-CM

## 2015-01-10 HISTORY — DX: Chronic kidney disease, unspecified: N18.9

## 2015-01-10 LAB — CBC WITH DIFFERENTIAL/PLATELET
Basophils Absolute: 0 10*3/uL (ref 0–0.1)
Basophils Relative: 1 %
EOS ABS: 0.1 10*3/uL (ref 0–0.7)
HCT: 25.2 % — ABNORMAL LOW (ref 35.0–47.0)
Hemoglobin: 8.4 g/dL — ABNORMAL LOW (ref 12.0–16.0)
LYMPHS ABS: 0.6 10*3/uL — AB (ref 1.0–3.6)
MCH: 29.6 pg (ref 26.0–34.0)
MCHC: 33.3 g/dL (ref 32.0–36.0)
MCV: 88.8 fL (ref 80.0–100.0)
MONO ABS: 0.4 10*3/uL (ref 0.2–0.9)
Neutro Abs: 3 10*3/uL (ref 1.4–6.5)
Neutrophils Relative %: 75 %
PLATELETS: 57 10*3/uL — AB (ref 150–440)
RBC: 2.84 MIL/uL — AB (ref 3.80–5.20)
RDW: 14.2 % (ref 11.5–14.5)
WBC: 4 10*3/uL (ref 3.6–11.0)

## 2015-01-10 LAB — BASIC METABOLIC PANEL
Anion gap: 15 (ref 5–15)
BUN: 52 mg/dL — AB (ref 6–20)
CHLORIDE: 96 mmol/L — AB (ref 101–111)
CO2: 16 mmol/L — AB (ref 22–32)
CREATININE: 5.87 mg/dL — AB (ref 0.44–1.00)
Calcium: 7.6 mg/dL — ABNORMAL LOW (ref 8.9–10.3)
GFR calc Af Amer: 7 mL/min — ABNORMAL LOW (ref >60)
GFR calc non Af Amer: 6 mL/min — ABNORMAL LOW (ref >60)
GLUCOSE: 114 mg/dL — AB (ref 65–99)
POTASSIUM: 2.9 mmol/L — AB (ref 3.5–5.1)
Sodium: 127 mmol/L — ABNORMAL LOW (ref 135–145)

## 2015-01-10 LAB — CREATININE, SERUM
CREATININE: 5.88 mg/dL — AB (ref 0.44–1.00)
GFR calc Af Amer: 7 mL/min — ABNORMAL LOW (ref >60)
GFR, EST NON AFRICAN AMERICAN: 6 mL/min — AB (ref >60)

## 2015-01-10 MED ORDER — POTASSIUM CHLORIDE CRYS ER 20 MEQ PO TBCR
20.0000 meq | EXTENDED_RELEASE_TABLET | Freq: Once | ORAL | Status: AC
  Administered 2015-01-10: 20 meq via ORAL
  Filled 2015-01-10: qty 1

## 2015-01-10 MED ORDER — ALBUTEROL SULFATE HFA 108 (90 BASE) MCG/ACT IN AERS: 2.0000 | INHALATION_SPRAY | Freq: Four times a day (QID) | RESPIRATORY_TRACT | Status: DC | PRN

## 2015-01-10 MED ORDER — ALPRAZOLAM 0.5 MG PO TABS
0.5000 mg | ORAL_TABLET | Freq: Three times a day (TID) | ORAL | Status: DC | PRN
  Administered 2015-01-10 – 2015-01-15 (×9): 0.5 mg via ORAL
  Filled 2015-01-10 (×9): qty 1

## 2015-01-10 MED ORDER — ARFORMOTEROL TARTRATE 15 MCG/2ML IN NEBU
15.0000 ug | INHALATION_SOLUTION | Freq: Two times a day (BID) | RESPIRATORY_TRACT | Status: DC
  Administered 2015-01-10 – 2015-01-11 (×2): 15 ug via RESPIRATORY_TRACT
  Filled 2015-01-10 (×5): qty 2

## 2015-01-10 MED ORDER — ALBUTEROL SULFATE (2.5 MG/3ML) 0.083% IN NEBU: 2.5000 mg | INHALATION_SOLUTION | RESPIRATORY_TRACT | Status: DC | PRN

## 2015-01-10 MED ORDER — ONDANSETRON HCL 4 MG/2ML IJ SOLN
4.0000 mg | Freq: Four times a day (QID) | INTRAMUSCULAR | Status: DC | PRN
  Administered 2015-01-11: 4 mg via INTRAVENOUS
  Filled 2015-01-10: qty 2

## 2015-01-10 MED ORDER — LORATADINE 10 MG PO TABS: 10.0000 mg | ORAL_TABLET | Freq: Every day | ORAL | Status: DC | PRN

## 2015-01-10 MED ORDER — ADULT MULTIVITAMIN W/MINERALS CH
1.0000 | ORAL_TABLET | Freq: Every day | ORAL | Status: DC
  Administered 2015-01-11 – 2015-01-15 (×5): 1 via ORAL
  Filled 2015-01-10 (×10): qty 1

## 2015-01-10 MED ORDER — MAGNESIUM OXIDE 400 (241.3 MG) MG PO TABS
400.0000 mg | ORAL_TABLET | Freq: Every day | ORAL | Status: DC
  Administered 2015-01-11 – 2015-01-12 (×2): 400 mg via ORAL
  Filled 2015-01-10 (×2): qty 1

## 2015-01-10 MED ORDER — HEPARIN SODIUM (PORCINE) 5000 UNIT/ML IJ SOLN
5000.0000 [IU] | Freq: Three times a day (TID) | INTRAMUSCULAR | Status: DC
  Administered 2015-01-10: 5000 [IU] via SUBCUTANEOUS
  Filled 2015-01-10: qty 1

## 2015-01-10 MED ORDER — PANTOPRAZOLE SODIUM 40 MG PO TBEC
40.0000 mg | DELAYED_RELEASE_TABLET | Freq: Every day | ORAL | Status: DC
  Administered 2015-01-11 – 2015-01-15 (×5): 40 mg via ORAL
  Filled 2015-01-10 (×5): qty 1

## 2015-01-10 MED ORDER — SODIUM CHLORIDE 0.9 % IV SOLN
INTRAVENOUS | Status: DC
  Administered 2015-01-10 – 2015-01-11 (×2): via INTRAVENOUS

## 2015-01-10 MED ORDER — NITROGLYCERIN 0.4 MG SL SUBL: 0.4000 mg | SUBLINGUAL_TABLET | SUBLINGUAL | Status: DC | PRN

## 2015-01-10 MED ORDER — ZINC SULFATE 220 (50 ZN) MG PO CAPS
ORAL_CAPSULE | Freq: Every day | ORAL | Status: DC
  Administered 2015-01-10 – 2015-01-15 (×6): 220 mg via ORAL
  Filled 2015-01-10 (×6): qty 1

## 2015-01-10 MED ORDER — ACETAMINOPHEN 325 MG PO TABS: 650.0000 mg | ORAL_TABLET | Freq: Four times a day (QID) | ORAL | Status: DC | PRN

## 2015-01-10 MED ORDER — ONDANSETRON HCL 4 MG PO TABS
4.0000 mg | ORAL_TABLET | Freq: Four times a day (QID) | ORAL | Status: DC | PRN
  Administered 2015-01-12: 4 mg via ORAL
  Filled 2015-01-10: qty 1

## 2015-01-10 MED ORDER — ACETAMINOPHEN 650 MG RE SUPP: 650.0000 mg | Freq: Four times a day (QID) | RECTAL | Status: DC | PRN

## 2015-01-10 MED ORDER — ISOSORBIDE MONONITRATE ER 30 MG PO TB24
60.0000 mg | ORAL_TABLET | Freq: Every day | ORAL | Status: DC
  Administered 2015-01-11 – 2015-01-15 (×5): 60 mg via ORAL
  Filled 2015-01-10 (×5): qty 2

## 2015-01-10 NOTE — H&P (Signed)
[facility] - [place] at [facility]   PATIENT NAME: Brandi Cline    MR#:  [id number]  DATE OF BIRTH:  January 03, 1931  DATE OF ADMISSION:  01/10/2015  PRIMARY CARE PHYSICIAN: [name], MD   REQUESTING/REFERRING PHYSICIAN: Dr. Thedore Mins  CHIEF COMPLAINT:  Acute renal failure   HISTORY OF PRESENT ILLNESS: Brandi Cline  is a 81 y.o. female with a known history of chronic kidney disease, COPD, chronic respiratory failure on 4 L of oxygen, GERD, coronary artery disease, hypertension who is followed by Dr. Thedore Mins. Patient was started on increased dose of Lasix recently as well as spiranolactone. Patient's creatinine has increased from 2.6 on August 24 to 5.34 yesterday. Due to acute renal failure he referred her for admission. Asian complains of generalized weakness which is chronic she has chronic shortness of breath which is chronic. She also reports that she has not been able to urinate much since Monday. PAST MEDICAL HISTORY:   Past Medical History  Diagnosis Date  . Dyspnea   . Esophageal reflux   . CAD (coronary artery disease)   . Hypertension   . Crohn's disease   . Allergic rhinitis   . COPD (chronic obstructive pulmonary disease)   . History of kidney stones   . History of shingles   . Trigeminal neuralgia   . Family history of anesthesia complication     " SISTER HAD BAD FEELINGS "  . Anginal pain   . Anxiety     ' JUST FOR MY BREATHING "  . Arthritis     mild in hands  . Hyperlipidemia   . CKD (chronic kidney disease)     PAST SURGICAL HISTORY:  Past Surgical History  Procedure Laterality Date  . Coronary stents    . Lithotripsy    . Ureteral stent placement    . Cholecystectomy    . Total abdominal hysterectomy    . Partial resections large and small bowel for crohn's    . Breast lumps benign    . Left heart catheterization with coronary angiogram N/A 10/28/2011    Procedure: LEFT HEART CATHETERIZATION WITH CORONARY ANGIOGRAM;  Surgeon: Lesleigh Noe, MD;  Location: [facility] CATH LAB;  Service: Cardiovascular;  Laterality: N/A;    SOCIAL HISTORY:  Social History  Substance Use Topics  . Smoking status: Former Smoker -- 1.00 packs/day for 30 years    Types: Cigarettes    Quit date: 04/12/1990  . Smokeless tobacco: Never Used  . Alcohol Use: No    FAMILY HISTORY:  Family History  Problem Relation Age of Onset  . Breast cancer Sister   . COPD Sister   . Stroke Mother   . Prostate cancer Father   . Prostate cancer Brother   . Heart attack Brother     DRUG ALLERGIES:  Allergies  Allergen Reactions  . Avelox [Moxifloxacin Hcl In Nacl] Shortness Of Breath, Swelling and Rash  . Codeine Shortness Of Breath and Other (See Comments)    Couldn't breath  . Theophyllines     Slight increased heart rate  . Ciprofloxacin Other (See Comments)    nervous  . Milk-Related Compounds Other (See Comments)    Whole milk hurts stomach. Not all dairy products do this.  . Lisinopril Rash    REVIEW OF SYSTEMS:   CONSTITUTIONAL: No fever, positive fatigue and weakness.  EYES: No blurred or double vision.  EARS, NOSE, AND THROAT: No tinnitus or ear pain.  RESPIRATORY: No cough, chronic  shortness of breath, no wheezing or hemoptysis.  CARDIOVASCULAR: No chest pain, orthopnea, edema.  GASTROINTESTINAL: No nausea, vomiting, diarrhea or abdominal pain.  GENITOURINARY: No dysuria, hematuria.  ENDOCRINE: No polyuria, nocturia,  HEMATOLOGY: No anemia, easy bruising or bleeding SKIN: No rash or lesion. MUSCULOSKELETAL: No joint pain or arthritis.   NEUROLOGIC: No tingling, numbness, weakness.  PSYCHIATRY: No anxiety or depression.   MEDICATIONS AT HOME:  Prior to Admission medications   Medication Sig Start Date End Date Taking? Authorizing Provider  albuterol (PROVENTIL HFA;VENTOLIN HFA) 108 (90 BASE) MCG/ACT inhaler Inhale 2 puffs into the lungs daily as needed for wheezing or shortness of breath. 03/20/13   Waymon Budge, MD   albuterol (PROVENTIL HFA;VENTOLIN HFA) 108 (90 BASE) MCG/ACT inhaler Inhale 2 puffs into the lungs every 6 (six) hours as needed for wheezing or shortness of breath. 10/08/14   Waymon Budge, MD  albuterol (PROVENTIL) (2.5 MG/3ML) 0.083% nebulizer solution Take 3 mLs (2.5 mg total) by nebulization every 4 (four) hours as needed. Shortness of breath 10/03/14   Waymon Budge, MD  ALPRAZolam Prudy Feeler) 0.5 MG tablet TAKE ONE TABLET BY MOUTH THREE TIMES DAILY AS NEEDED FOR ANXIETY 07/15/14   Dianne Dun, MD  Apple Cider Vinegar 500 MG TABS Take 1 tablet by mouth daily.    Historical Provider, MD  arformoterol (BROVANA) 15 MCG/2ML NEBU Take 2 mLs (15 mcg total) by nebulization 2 (two) times daily. 08/14/14   Waymon Budge, MD  BIOTIN 5000 PO Take 2 tablets by mouth daily.    Historical Provider, MD  Cholecalciferol (VITAMIN D-3) 5000 UNITS TABS Take 1 tablet by mouth daily.    Historical Provider, MD  furosemide (LASIX) 20 MG tablet Take 20-40 mg by mouth daily.    Historical Provider, MD  isosorbide mononitrate (IMDUR) 30 MG 24 hr tablet TAKE TWO TABLETS BY MOUTH DAILY 08/13/14   Lyn Records, MD  loratadine (CLARITIN) 10 MG tablet Take 10 mg by mouth daily as needed.  07/11/12   Marden Noble, MD  magnesium oxide (MAG-OX) 400 MG tablet Take 400 mg by mouth daily.     Historical Provider, MD  Multiple Vitamin (MULTIVITAMIN) tablet Take 1 tablet by mouth daily.     Historical Provider, MD  nitroGLYCERIN (NITROSTAT) 0.4 MG SL tablet Place 1 tablet (0.4 mg total) under the tongue every 5 (five) minutes as needed. Chest pain 04/27/13   Lyn Records, MD  omeprazole (PRILOSEC) 20 MG capsule TAKE ONE CAPSULE BY MOUTH DAILY 12/03/14   Dianne Dun, MD  Zinc Acetate, Oral, (ZINC ACETATE PO) Take 1 tablet by mouth daily.    Historical Provider, MD      PHYSICAL EXAMINATION:   VITAL SIGNS: There were no vitals taken for this visit.  GENERAL:  81 y.o.-year-old patient lying in the bed chronically  ill-appearing EYES: Pupils equal, round, reactive to light and accommodation. No scleral icterus. Extraocular muscles intact.  HEENT: Head atraumatic, normocephalic. Oropharynx and nasopharynx clear.  NECK:  Supple, no jugular venous distention. No thyroid enlargement, no tenderness.  LUNGS: Normal breath sounds bilaterally, no wheezing, rales,rhonchi or crepitation. No use of accessory muscles of respiration.  CARDIOVASCULAR: S1, S2 normal. No murmurs, rubs, or gallops.  ABDOMEN: Soft, nontender, nondistended. Bowel sounds present. No organomegaly or mass.  EXTREMITIES: 2+ pedal edema, cyanosis, or clubbing.  NEUROLOGIC: Cranial nerves II through XII are intact. Muscle strength 5/5 in all extremities. Sensation intact. Gait not checked.  PSYCHIATRIC: The patient is  alert and oriented x 3.  SKIN: No obvious rash, lesion, or ulcer.   LABORATORY PANEL:   CBC  Recent Labs Lab 01/08/15 1317  WBC 4.7  HGB 8.7*  HCT 26.5*  PLT 68*  MCV 90.9  MCH 29.8  MCHC 32.7  RDW 14.6*  LYMPHSABS 0.8*  MONOABS 0.5  EOSABS 0.1  BASOSABS 0.0   ------------------------------------------------------------------------------------------------------------------  Chemistries   Recent Labs Lab 01/09/15 1719  NA 124*  K 3.1*  CL 95*  CO2 17*  GLUCOSE 146*  BUN 48*  CREATININE 5.34*  CALCIUM 7.4*   ------------------------------------------------------------------------------------------------------------------ estimated creatinine clearance is 7.4 mL/min (by C-G formula based on Cr of 5.34). ------------------------------------------------------------------------------------------------------------------ No results for input(s): TSH, T4TOTAL, T3FREE, THYROIDAB in the last 72 hours.  Invalid input(s): FREET3   Coagulation profile No results for input(s): INR, PROTIME in the last 168  hours. ------------------------------------------------------------------------------------------------------------------- No results for input(s): DDIMER in the last 72 hours. -------------------------------------------------------------------------------------------------------------------  Cardiac Enzymes No results for input(s): CKMB, TROPONINI, MYOGLOBIN in the last 168 hours.  Invalid input(s): CK ------------------------------------------------------------------------------------------------------------------ Invalid input(s): POCBNP  ---------------------------------------------------------------------------------------------------------------  Urinalysis    Component Value Date/Time   COLORURINE ORANGE* 09/19/2013 2000   APPEARANCEUR CLOUDY* 09/19/2013 2000   LABSPEC 1.019 09/19/2013 2000   PHURINE 5.0 09/19/2013 2000   GLUCOSEU NEGATIVE 09/19/2013 2000   HGBUR NEGATIVE 09/19/2013 2000   BILIRUBINUR neg 09/07/2014 1150   BILIRUBINUR SMALL* 09/19/2013 2000   KETONESUR 15* 09/19/2013 2000   PROTEINUR positive 09/07/2014 1150   PROTEINUR NEGATIVE 09/19/2013 2000   UROBILINOGEN negative 09/07/2014 1150   UROBILINOGEN 1.0 09/19/2013 2000   NITRITE neg 09/07/2014 1150   NITRITE POSITIVE* 09/19/2013 2000   LEUKOCYTESUR moderate (2+) 09/07/2014 1150     RADIOLOGY: No results found.  EKG: Orders placed or performed during the hospital encounter of 09/19/13  . EKG 12-Lead  . EKG 12-Lead  . EKG    IMPRESSION AND PLAN: Patient is 81 year old white female with chronic renal failure and chronic respiratory failure presents with worsening renal failure  1. Acute renal failure on chronic kidney disease suspect due to over diuresis, ATN is also a possibility- hold Lasix and spirnaloctone, give IV fluids follow BMP  2. Chronic respiratory failure due to COPD continue oxygen and nebulizers as taking at home  3. Anxiety disorder continue Xanax  4. Coronary artery disease  continue Imdur  5. Gerd: omerazole  6. Misc: lovenox  All the records are reviewed and case discussed with ED provider. Management plans discussed with the patient, family and they are in agreement.  CODE STATUS:    Code Status Orders        Start     Ordered   01/10/15 1247  Full code   Continuous     01/10/15 1247       TOTAL TIME TAKING CARE OF THIS PATIENT: <MEASUREAustin Va Outpatient CliniAllen Marland KitchenDeFederated Depart7JetTedd SiaSt. John Rehabilitation Hospital Affiliated WithWalter Reed National Militar<MEASUREMEAdvanced Endoscopy Center Of Howard County LLAllen Marland KitchenDeFederated Depart56JetTedd SiaSumma Health Systems AkSt. Hatsumi'S Healthcare - Amsterdam<MEASUREMEUnitypoint Health MarshalltowAllen Marland KitchenDeFederated [id number] SiaSentara Virginia Beach GeneSd Human<MEASUREMECopley HospitaAllen Marland KitchenDeFederated Depart8JetTedd SiInstitute Of Orthopa<MEASUREMEHealtheast St Johns HospitaAllen Marland KitchenDeFederated DepartJetTedd SiaFlorida Orthopaedic Institute SurgerIcon Surgery <MEASUREMECardinal Hill Rehabilitation HospitaAllen Marland KitchenDeFederated Depart416-JetTedd SiaDecatur CouGlendale Memorial Hospital A<MEASUREMEFloyd Valley HospitaAllen Marland KitchenDeFederated Depart5JetTedd SiaAlamarconHosp Psiquiatrico Dr Ramon <MEASUREMEOutpatient Surgical Care LtAllen Marland KitchenDeFederated DepartJetTedd SiaParkwest SuSan An<MEASUREMEWichita Endoscopy Center LLAllen Marland KitchenDeFederated Depart(210JetTedd SiaVibra Hospital OPutnam<MEASUREMELandmark Hospital Of JopliAllen Marland KitchenDeFederated Depart66JetTedd SiaCopper Queen Douglas EmergencRus<MEASUREMECarilion Surgery Center [facility] Valley LLAllen Marland KitchenDeFederated DepartJetTedd SiaSaints Elysia & ElizabNevada Regiona<MEASUREMEAdventist Health St. Helena HospitaAllen Marland KitchenDeFederated Depart40JetTedd SiaMountain View Regional MeLake Wale<MEASUREMEGeneral Leonard Wood Army Community HospitaAllen Marland KitchenDeFederated Depart7JetTedd SiaNewnan EndoscopAkron Surgica<MEASUREMEWest River EndoscopAllen Marland KitchenDeFederated DepartJetTedd SiaSouthwest Medical AsMethodist M<MEASUREMEAdvocate Good Shepherd HospitaAllen Marland KitchenDeFederated Depart551JetTedd SiaSurgery Center Of Cherry Hill D B A Wills Surgery Center OfGold C<MEASUREMEMission Trail Baptist Hospital-EAllen Marland KitchenDeFederated Depart7JetTedd SiaJackson County PubAnn Klein Forensic Center InaSHREYANG M.D on 01/10/2015 at 12:54 PM  Between 7am to 6pm - Pager - [phone]  After 6pm go to [email or web address] - password [email or web address]  [facility] [place]  Office  [phone]  CC: Primary care physician; Talia Aron, MD

## 2015-01-10 NOTE — Progress Notes (Signed)
Pt admitted to [facility] as a Direct Admit from Dr. Doristine Church office. Pt being treated for known chronic kidney disease, stating that she has been having problems urinating. Pt has maintained on 4L and with history of COPD, we will continue to wean if sats maintain. No c/o pain, IV fluids infusing, ambulates with standby assistance, labs to be drawn in the morning. Adelina Mings RN

## 2015-01-11 ENCOUNTER — Inpatient Hospital Stay: Payer: Medicare HMO

## 2015-01-11 LAB — CBC
HEMATOCRIT: 21.7 % — AB (ref 35.0–47.0)
Hemoglobin: 7.3 g/dL — ABNORMAL LOW (ref 12.0–16.0)
MCH: 29.4 pg (ref 26.0–34.0)
MCHC: 33.4 g/dL (ref 32.0–36.0)
MCV: 88.1 fL (ref 80.0–100.0)
PLATELETS: 45 10*3/uL — AB (ref 150–440)
RBC: 2.47 MIL/uL — ABNORMAL LOW (ref 3.80–5.20)
RDW: 13.9 % (ref 11.5–14.5)
WBC: 2.6 10*3/uL — AB (ref 3.6–11.0)

## 2015-01-11 LAB — BASIC METABOLIC PANEL
ANION GAP: 12 (ref 5–15)
BUN: 57 mg/dL — ABNORMAL HIGH (ref 6–20)
CALCIUM: 7.1 mg/dL — AB (ref 8.9–10.3)
CO2: 16 mmol/L — ABNORMAL LOW (ref 22–32)
CREATININE: 6.14 mg/dL — AB (ref 0.44–1.00)
Chloride: 102 mmol/L (ref 101–111)
GFR, EST AFRICAN AMERICAN: 7 mL/min — AB (ref >60)
GFR, EST NON AFRICAN AMERICAN: 6 mL/min — AB (ref >60)
Glucose, Bld: 95 mg/dL (ref 65–99)
Potassium: 3.2 mmol/L — ABNORMAL LOW (ref 3.5–5.1)
SODIUM: 130 mmol/L — AB (ref 135–145)

## 2015-01-11 MED ORDER — SODIUM CHLORIDE 0.9 % IV SOLN: INTRAVENOUS | Status: DC

## 2015-01-11 MED ORDER — PREDNISONE 20 MG PO TABS
20.0000 mg | ORAL_TABLET | Freq: Once | ORAL | Status: AC
  Administered 2015-01-11: 20 mg via ORAL
  Filled 2015-01-11: qty 1

## 2015-01-11 MED ORDER — ARFORMOTEROL TARTRATE 15 MCG/2ML IN NEBU
15.0000 ug | INHALATION_SOLUTION | Freq: Two times a day (BID) | RESPIRATORY_TRACT | Status: DC
  Administered 2015-01-11 – 2015-01-15 (×8): 15 ug via RESPIRATORY_TRACT
  Filled 2015-01-11 (×12): qty 2

## 2015-01-11 NOTE — Progress Notes (Signed)
Called Dr. Anne Hahn to inform him of patient's Hgb -7.3 and blood pressure 115/38.  Doctor wants to continue to monitor for any signs of bleeding and he ordered a CBC in the morning to check Hgb.  Brandi Cline 01/11/2015  8:43 PM

## 2015-01-11 NOTE — Progress Notes (Signed)
[facility] - [place] at [facility]   PATIENT NAME: Brandi Cline    MR#:  [id number]  DATE OF BIRTH:  [birth year]  SUBJECTIVE: Feels short of breath. Able to to urinate more than before. 81 year old female with a COPD chronic respiratory failure on several liters of oxygen at home, she occasionally states she admitted secondary to oliguric renal failure. Patient started on IV fluids. The patient says that she feels better and able to urinate about 3-4 times since admission.   CHIEF COMPLAINT:  No chief complaint on file.   REVIEW OF SYSTEMS:   ROS CONSTITUTIONAL: No fever, fatigue or weakness.  EYES: No blurred or double vision.  EARS, NOSE, AND THROAT: No tinnitus or ear pain.  RESPIRATORY: No cough, shortness of breath, wheezing or hemoptysis.  CARDIOVASCULAR: No chest pain, orthopnea, edema.  GASTROINTESTINAL: No nausea, vomiting, diarrhea or abdominal pain.  GENITOURINARY: No dysuria, hematuria.  ENDOCRINE: No polyuria, nocturia,  HEMATOLOGY: No anemia, easy bruising or bleeding SKIN: No rash or lesion. MUSCULOSKELETAL: No joint pain or arthritis.   NEUROLOGIC: No tingling, numbness, weakness.  PSYCHIATRY: No anxiety or depression.   DRUG ALLERGIES:   Allergies  Allergen Reactions  . Avelox [Moxifloxacin Hcl In Nacl] Shortness Of Breath, Swelling and Rash  . Codeine Shortness Of Breath and Other (See Comments)    Couldn't breath  . Theophyllines     Slight increased heart rate  . Ciprofloxacin Other (See Comments)    nervous  . Milk-Related Compounds Other (See Comments)    Whole milk hurts stomach. Not all dairy products do this.  . Tape     Please use paper tape only  . Lisinopril Rash    VITALS:  Blood pressure 117/65, pulse 73, temperature 98.4 F (36.9 C), temperature source Oral, resp. rate 16, height  (1.651 m), weight 62.823 kg (138 lb 8 oz), SpO2 98 %.  PHYSICAL EXAMINATION:  GENERAL:  81 y.o.-year-old patient lying in the bed  with no acute distress.  EYES: Pupils equal, round, reactive to light and accommodation. No scleral icterus. Extraocular muscles intact.  HEENT: Head atraumatic, normocephalic. Oropharynx and nasopharynx clear.  NECK:  Supple, no jugular venous distention. No thyroid enlargement, no tenderness.  LUNGS ; decreased breath sounds at bases.no rales,rhonchi or crepitation. No use of accessory muscles of respiration.  CARDIOVASCULAR: S1, S2 normal. No murmurs, rubs, or gallops.  ABDOMEN: Soft, nontender, nondistended. Bowel sounds present. No organomegaly or mass.  EXTREMITIES: biLateral extremity edema present NEUROLOGIC: Cranial nerves II through XII are intact. Muscle strength 5/5 in all extremities. Sensation intact. Gait not checked.  PSYCHIATRIC: The patient is alert and oriented x 3.  SKIN: No obvious rash, lesion, or ulcer.    LABORATORY PANEL:   CBC  Recent Labs Lab 01/11/15 0551  WBC 2.6*  HGB 7.3*  HCT 21.7*  PLT 45*   ------------------------------------------------------------------------------------------------------------------  Chemistries   Recent Labs Lab 01/11/15 0551  NA 130*  K 3.2*  CL 102  CO2 16*  GLUCOSE 95  BUN 57*  CREATININE 6.14*  CALCIUM 7.1*   ------------------------------------------------------------------------------------------------------------------  Cardiac Enzymes No results for input(s): TROPONINI in the last 168 hours. ------------------------------------------------------------------------------------------------------------------  RADIOLOGY:  No results found.  EKG:   Orders placed or performed during the hospital encounter of 09/19/13  . EKG 12-Lead  . EKG 12-Lead  . EKG    ASSESSMENT AND PLAN:   : Acute renal failure on now chronic kidney disease stage III: Oliguric renal failure. Patient  Lasix,  Aldactone are  Held, Right now she is getting intravenous fluids at 75 cc an hour. Renal function is worse today. Ordered a  renal ultrasound . Appreciate nephrology following. Has metabolic acidosis with bicarbonate was 16: #2 chronic respiratory failure secondary to COPD: Continue oxygen. #3 anxiety disorder continue Xanax. 4. hyponatremia, hypokalemia ; improving. #5 pancytopenia with the anemia, low platelets of 45. Looks like chronic. No evidence of bleeding: Watch for hematuria, obvious signs of bleeding.  #6. history of coronary artery disease and stents;  All the records are reviewed and case discussed with Care Management/Social Workerr. Management plans discussed with the patient, family and they are in agreement.  CODE STATUS:full  TOTAL TIME TAKING CARE OF THIS PATIENT: 35  minutes.   POSSIBLE D/C IN  4-5 DAYS, DEPENDING ON CLINICAL CONDITION.   Katha Hamming M.D on 01/11/2015 at 9:49 AM  Between 7am to 6pm - Pager - [phone]  After 6pm go to [email or web address] - password [email or web address] [facility]  [place]  [facility]  Office  [phone]  CC: Primary care physician; [name], MD

## 2015-01-11 NOTE — Progress Notes (Signed)
Initial Nutrition Assessment  INTERVENTION:   Meals and Snacks: Cater to patient preferences on low sodium diet order Medical Food Supplement Therapy: will recommend Boost Breeze on follow if intake poor. Pt reports drinking ensure at home but that MD wanted her to not drink currently secondary to milky consistency.   NUTRITION DIAGNOSIS:   Altered GI function related to nausea as evidenced by per patient/family report.  GOAL:   Patient will meet greater than or equal to 90% of their needs  MONITOR:    (Energy Intake, Digestive system, Electrolyte and renal profile, anthropometrics)  REASON FOR ASSESSMENT:   Diagnosis    ASSESSMENT:   Pt admitted with worsening LEE with acute renal failure likely secondary to over diuresing. RD notes h/o Crohn's with partial resections.  Past Medical History  Diagnosis Date  . Dyspnea   . Esophageal reflux   . CAD (coronary artery disease)   . Hypertension   . Crohn's disease   . Allergic rhinitis   . COPD (chronic obstructive pulmonary disease)   . History of kidney stones   . History of shingles   . Trigeminal neuralgia   . Family history of anesthesia complication     " SISTER HAD BAD FEELINGS "  . Anginal pain   . Anxiety     ' JUST FOR MY BREATHING "  . Arthritis     mild in hands  . Hyperlipidemia   . CKD (chronic kidney disease)    Past Surgical History  Procedure Laterality Date  . Coronary stents    . Lithotripsy    . Ureteral stent placement    . Cholecystectomy    . Total abdominal hysterectomy    . Partial resections large and small bowel for crohn's    . Breast lumps benign    . Left heart catheterization with coronary angiogram N/A 10/28/2011    Procedure: LEFT HEART CATHETERIZATION WITH CORONARY ANGIOGRAM;  Surgeon: Lesleigh Noe, MD;  Location: [facility] CATH LAB;  Service: Cardiovascular;  Laterality: N/A;    Diet Order:  Diet 2 gram sodium Room service appropriate?: Yes; Fluid consistency:: Thin     Current Nutrition: Pt reports eating some lunch today but now feeling very nauseous.  Food/Nutrition-Related History:  Pt reports good appetite PTA ' I eat all throughout the day.' Pt reports having been on a low salt diet PTA per MD orders.   Medications: NS at 36mL/hr, Mag-Ox, MVi, Protonix, ZnS  Electrolyte/Renal Profile and Glucose Profile:   Recent Labs Lab 01/09/15 1719 01/10/15 1334 01/11/15 0551  NA 124* 127* 130*  K 3.1* 2.9* 3.2*  CL 95* 96* 102  CO2 17* 16* 16*  BUN 48* 52* 57*  CREATININE 5.34* 5.87*  5.88* 6.14*  CALCIUM 7.4* 7.6* 7.1*  PHOS 7.5*  --   --   GLUCOSE 146* 114* 95   Protein Profile:  Recent Labs Lab 01/09/15 1719  ALBUMIN 3.5    Gastrointestinal Profile: Last BM:  01/11/2015   Nutrition-Focused Physical Exam Findings:  Unable to complete Nutrition-Focused physical exam at this time.    Weight Change: Pt with stable weight within the past week however weight gain in 4 days. Pt with approximately 5% weight loss in 3-4 months.   Skin:  Reviewed, no issues  Height:   Ht Readings from Last 1 Encounters:  01/10/15  (1.651 m)    Weight:   Wt Readings from Last 1 Encounters:  01/10/15 138 lb 8 oz (62.823 kg)  Wt Readings from Last 10 Encounters:  01/10/15 138 lb 8 oz (62.823 kg)  01/06/15 135 lb 12.8 oz (61.598 kg)  01/01/15 138 lb 14.4 oz (63.005 kg)  12/04/14 144 lb 9.6 oz (65.59 kg)  10/21/14 149 lb 4 oz (67.699 kg)  10/07/14 146 lb 8 oz (66.452 kg)  10/04/14 141 lb 12 oz (64.297 kg)  10/03/14 148 lb (67.132 kg)  09/07/14 147 lb (66.679 kg)  09/02/14 149 lb (67.586 kg)     BMI:  Body mass index is 23.05 kg/(m^2).  Estimated Nutritional Needs:   Kcal:  BEE: 1093kcals, TEE: (IF 1.1-1.3)(AF 1.2) 1444-1705kcals  Protein:  50-63g protein (0.8-1.0g/kg)  Fluid:  1570-1872mL of fluid (25-81mL/kg)   EDUCATION NEEDS:   Education needs no appropriate at this time    MODERATE Care Level  Leda Quail, RD,  LDN Pager [phone]

## 2015-01-11 NOTE — Progress Notes (Signed)
Subjective:  Patient is a 81 year old female with multiple medical problems. She is followed as an outpatient for chronic kidney disease. Her baseline creatinine is 2.13/GFR 21 from early September. She was getting oral diuretics for worsening lower extremity edema. Over the past days, she has noticed that her weight Going down and her by mouth intake was poor. She kept taking her diuretics At present, she is receiving IV fluids. Blood pressure stable. No fevers or chills or nausea or vomiting reported. She has developed lower extremity edema. At the time of admission, her serum creatinine increased to 5.34 Sodium level was down to 124  Objective:  Vital signs in last 24 hours:  Temp:  [97.9 F (36.6 C)-98.4 F (36.9 C)] 98.4 F (36.9 C) (10/01 0602) Pulse Rate:  [62-73] 73 (10/01 0602) Resp:  [16-20] 16 (10/01 0602) BP: (117-123)/(43-65) 117/65 mmHg (10/01 0602) SpO2:  [98 %-100 %] 98 % (10/01 0602) Weight:  [62.823 kg (138 lb 8 oz)] 62.823 kg (138 lb 8 oz) (09/30 1306)  Weight change:  Filed Weights   01/10/15 1306  Weight: 62.823 kg (138 lb 8 oz)    Intake/Output:    Intake/Output Summary (Last 24 hours) at 01/11/15 1104 Last data filed at 01/11/15 1009  Gross per 24 hour  Intake 1184.11 ml  Output    400 ml  Net 784.11 ml     Physical Exam: General:  no distress, laying in the bed, elderly, frail   HEENT  moist oral mucous membranes, anicteric   Neck  supple   Pulm/lungs  normal effort,  oxygen, clear to auscultation bilaterally   CVS/Heart  irregular rhythm, no rub or gallop   Abdomen:   soft, nontender, nondistended   Extremities:  2+ pitting edema bilaterally   Neurologic:  alert, oriented, nonfocal   Skin:  no acute rashes   Access:        Basic Metabolic Panel:   Recent Labs Lab 01/09/15 1719 01/10/15 1334 01/11/15 0551  NA 124* 127* 130*  K 3.1* 2.9* 3.2*  CL 95* 96* 102  CO2 17* 16* 16*  GLUCOSE 146* 114* 95  BUN 48* 52* 57*  CREATININE  5.34* 5.87*  5.88* 6.14*  CALCIUM 7.4* 7.6* 7.1*  PHOS 7.5*  --   --      CBC:  Recent Labs Lab 01/08/15 1317 01/10/15 1334 01/11/15 0551  WBC 4.7 4.0 2.6*  NEUTROABS 3.4 3.0  --   HGB 8.7* 8.4* 7.3*  HCT 26.5* 25.2* 21.7*  MCV 90.9 88.8 88.1  PLT 68* 57* 45*      Microbiology:  No results found for this or any previous visit (from the past 720 hour(s)).  Coagulation Studies: No results for input(s): LABPROT, INR in the last 72 hours.  Urinalysis: No results for input(s): COLORURINE, LABSPEC, PHURINE, GLUCOSEU, HGBUR, BILIRUBINUR, KETONESUR, PROTEINUR, UROBILINOGEN, NITRITE, LEUKOCYTESUR in the last 72 hours.  Invalid input(s): APPERANCEUR    Imaging: No results found.   Medications:   . sodium chloride 75 mL/hr at 01/11/15 0450   . arformoterol  15 mcg Nebulization BID  . isosorbide mononitrate  60 mg Oral Daily  . magnesium oxide  400 mg Oral Daily  . multivitamin with minerals  1 tablet Oral Daily  . pantoprazole  40 mg Oral Daily  . zinc sulfate   Oral Daily   acetaminophen **OR** acetaminophen, albuterol, ALPRAZolam, loratadine, nitroGLYCERIN, ondansetron **OR** ondansetron (ZOFRAN) IV  Assessment/ Plan:  81 y.o. female With history of cirrhosis of unknown  etiology, Crohn's disease status post surgery which required parts of small and large intestines removed, coronary disease, angioplasty and stent, congestive heart failure, history of kidney stones, history of ureteral stent, history of frequent UTIs, chronic kidney disease with baseline creatinine 2.13/GFR 21  1. Acute renal failure, on CKD st 4 (B GFR 21/Cr 2.1)likely secondary to ATN from overdiuresis/dehydration. At present, I agree with IV fluid supplementation for now. Patient is now starting to develop lower extremity edema. Will DC IV fluids after the current bag is complete Oral intake appears to be okay at present Agree with holding diuretics Electrolytes and Volume status are  acceptable No acute indication for Dialysis at present  2. Hypokalemia May provide oral supplement Will follow    LOS: 1 Kahmari Herard 10/1/201611:04 AM

## 2015-01-12 LAB — CBC
HEMATOCRIT: 21.7 % — AB (ref 35.0–47.0)
Hemoglobin: 7.5 g/dL — ABNORMAL LOW (ref 12.0–16.0)
MCH: 30.6 pg (ref 26.0–34.0)
MCHC: 34.5 g/dL (ref 32.0–36.0)
MCV: 88.8 fL (ref 80.0–100.0)
Platelets: 50 10*3/uL — ABNORMAL LOW (ref 150–440)
RBC: 2.45 MIL/uL — ABNORMAL LOW (ref 3.80–5.20)
RDW: 14.1 % (ref 11.5–14.5)
WBC: 2 10*3/uL — AB (ref 3.6–11.0)

## 2015-01-12 LAB — BASIC METABOLIC PANEL
ANION GAP: 11 (ref 5–15)
BUN: 58 mg/dL — ABNORMAL HIGH (ref 6–20)
CALCIUM: 7.4 mg/dL — AB (ref 8.9–10.3)
CO2: 15 mmol/L — ABNORMAL LOW (ref 22–32)
Chloride: 109 mmol/L (ref 101–111)
Creatinine, Ser: 6.48 mg/dL — ABNORMAL HIGH (ref 0.44–1.00)
GFR calc Af Amer: 6 mL/min — ABNORMAL LOW (ref >60)
GFR calc non Af Amer: 5 mL/min — ABNORMAL LOW (ref >60)
GLUCOSE: 115 mg/dL — AB (ref 65–99)
POTASSIUM: 3.2 mmol/L — AB (ref 3.5–5.1)
Sodium: 135 mmol/L (ref 135–145)

## 2015-01-12 NOTE — Progress Notes (Signed)
[facility] [place] [facility]   PATIENT NAME: Brandi Cline    MR#:  [id number]  DATE OF BIRTH:  1930/06/06  SUBJECTIVE: seen today, she says she feels better than before. No shortness of breath, no cough, no chest pain. Complains of  Poor apetite. Feels weak. urine out put   is improved.   CHIEF COMPLAINT:  No chief complaint on file.   REVIEW OF SYSTEMS:   ROS CONSTITUTIONAL: No fever, fatigue or weakness.  EYES: No blurred or double vision.  EARS, NOSE, AND THROAT: No tinnitus or ear pain.  RESPIRATORY: No cough, shortness of breath, wheezing or hemoptysis.  CARDIOVASCULAR: No chest pain, orthopnea, edema.  GASTROINTESTINAL: No nausea, vomiting, diarrhea or abdominal pain.  GENITOURINARY: No dysuria, hematuria.  ENDOCRINE: No polyuria, nocturia,  HEMATOLOGY: No anemia, easy bruising or bleeding SKIN: No rash or lesion. MUSCULOSKELETAL: No joint pain or arthritis.   NEUROLOGIC: No tingling, numbness, weakness.  PSYCHIATRY: No anxiety or depression.   DRUG ALLERGIES:   Allergies  Allergen Reactions  . Avelox [Moxifloxacin Hcl In Nacl] Shortness Of Breath, Swelling and Rash  . Codeine Shortness Of Breath and Other (See Comments)    Couldn't breath  . Theophyllines     Slight increased heart rate  . Ciprofloxacin Other (See Comments)    nervous  . Milk-Related Compounds Other (See Comments)    Whole milk hurts stomach. Not all dairy products do this.  . Tape     Please use paper tape only  . Lisinopril Rash    VITALS:  Blood pressure 130/52, pulse 64, temperature 98 F (36.7 C), temperature source Oral, resp. rate 17, height  (1.651 m), weight 62.823 kg (138 lb 8 oz), SpO2 96 %.  PHYSICAL EXAMINATION:  GENERAL:  81 y.o.-year-old patient lying in the bed with no acute distress.  EYES: Pupils equal, round, reactive to light and accommodation. No scleral icterus. Extraocular muscles intact.  HEENT: Head atraumatic, normocephalic.  Oropharynx and nasopharynx clear.  NECK:  Supple, no jugular venous distention. No thyroid enlargement, no tenderness.  LUNGS ; decreased breath sounds at bases.no rales,rhonchi or crepitation. No use of accessory muscles of respiration.  CARDIOVASCULAR: S1, S2 normal. No murmurs, rubs, or gallops.  ABDOMEN: Soft, nontender, nondistended. Bowel sounds present. No organomegaly or mass.  EXTREMITIES: biLateral extremity edema present NEUROLOGIC: Cranial nerves II through XII are intact. Muscle strength 5/5 in all extremities. Sensation intact. Gait not checked.  PSYCHIATRIC: The patient is alert and oriented x 3.  SKIN: No obvious rash, lesion, or ulcer.    LABORATORY PANEL:   CBC  Recent Labs Lab 01/12/15 0428  WBC 2.0*  HGB 7.5*  HCT 21.7*  PLT 50*   ------------------------------------------------------------------------------------------------------------------  Chemistries   Recent Labs Lab 01/12/15 0428  NA 135  K 3.2*  CL 109  CO2 15*  GLUCOSE 115*  BUN 58*  CREATININE 6.48*  CALCIUM 7.4*   ------------------------------------------------------------------------------------------------------------------  Cardiac Enzymes No results for input(s): TROPONINI in the last 168 hours. ------------------------------------------------------------------------------------------------------------------  RADIOLOGY:  US Renal  01/11/2015   CLINICAL DATA:  Acute on chronic renal failure.  EXAM: RENAL / URINARY TRACT ULTRASOUND COMPLETE  COMPARISON:  10/22/2014 CT  FINDINGS: Right Kidney:  Length: 11.6 cm. Diffusely increased echogenicity noted. There is no evidence of hydronephrosis or solid mass.  Left Kidney:  Length: 12.1 cm. Diffusely increased echogenicity noted. There is no evidence of hydronephrosis or solid mass. A 4 mm echogenic focus within the mid-lower left kidney  may represent a nonobstructing calculus.  Bladder:  Appears normal for degree of bladder distention.   Incidentally noted is cirrhosis and small amount of ascites.  IMPRESSION: Increased renal echogenicity compatible with medical renal disease. Normal sized kidneys. No evidence of hydronephrosis.  Possible nonobstructing 4 mm mid-lower left renal calculus.  Cirrhosis and small amount of ascites.   Electronically Signed   By: Harmon Pier M.D.   On: 01/11/2015 11:26    EKG:   Orders placed or performed during the hospital encounter of 09/19/13  . EKG 12-Lead  . EKG 12-Lead  . EKG    ASSESSMENT AND PLAN:   : Acute renal failure on now chronic kidney disease stage 4; secondary to  ATN from over diuresis/dehydration. Oliguric renal failure. Has worsening renal failure with creatinine up to 6.4 today.  Patient  Lasix, Aldactone are  Held,  . Has metabolic acidosis with bicarbonate was 16: Renal sonogram for medical renal disease. Patient volume status is acceptable .electrolytes   are acceptable. Nephrology following #2 chronic respiratory failure secondary to COPD: Continue oxygen. #3 anxiety disorder continue Xanax. 4. hyponatremia, hypokalemia ; improving. #5 pancytopenia with the anemia, low platelets of 45. Looks like chronic. No evidence of bleeding: Watch for hematuria, obvious signs of bleeding.  #6. history of coronary artery disease and stents;  All the records are reviewed and case discussed with Care Management/Social Workerr. Management plans discussed with the patient, family and they are in agreement.  CODE STATUS:full  TOTAL TIME TAKING CARE OF THIS PATIENT: 35  minutes.   POSSIBLE D/C IN  4-5 DAYS, DEPENDING ON CLINICAL CONDITION.   Katha Hamming M.D on 01/12/2015 at 10:38 AM  Between 7am to 6pm - Pager - [phone]  After 6pm go to [email or web address] - password [email or web address] [facility]  [place]  [facility]  Office  [phone]  CC: Primary care physician; [name], MD

## 2015-01-12 NOTE — Progress Notes (Signed)
Subjective:  Patient is a 81 year old female with multiple medical problems. She is followed as an outpatient for chronic kidney disease. Her baseline creatinine is 2.13/GFR 21 from early September. She was getting oral diuretics for worsening lower extremity edema. Over the past days, she has noticed that her weight Going down and her by mouth intake was poor. She kept taking her diuretics Blood pressure stable. No fevers or chills or nausea or vomiting reported. She has developed lower extremity edema. At the time of admission, her serum creatinine increased to 5.34, sodium was down to 124 Today's creatinine has worsened to 6.48, sodium is improved to 135, potassium slightly low at 3.2, bicarbonate is low at 15 Clinically however, patient feels better and is asking about going home Appetite is good, no shortness of breath  Objective:  Vital signs in last 24 hours:  Temp:  [98 F (36.7 C)-98.2 F (36.8 C)] 98 F (36.7 C) (10/02 0459) Pulse Rate:  [64-71] 64 (10/02 0459) Resp:  [17-20] 17 (10/02 0459) BP: (99-130)/(32-52) 130/52 mmHg (10/02 0604) SpO2:  [96 %-99 %] 96 % (10/02 0731)  Weight change:  Filed Weights   01/10/15 1306  Weight: 62.823 kg (138 lb 8 oz)    Intake/Output:    Intake/Output Summary (Last 24 hours) at 01/12/15 1032 Last data filed at 01/12/15 0920  Gross per 24 hour  Intake 1082.96 ml  Output    400 ml  Net 682.96 ml     Physical Exam: General:  no distress, sitting at the edge of the bed, elderly, frail   HEENT  moist oral mucous membranes, anicteric   Neck  supple   Pulm/lungs  normal effort, [place] oxygen, clear to auscultation bilaterally   CVS/Heart  irregular rhythm, no rub or gallop   Abdomen:   soft, nontender, nondistended   Extremities:  2+ pitting edema bilaterally   Neurologic:  alert, oriented, nonfocal   Skin:  no acute rashes   Access:        Basic Metabolic Panel:   Recent Labs Lab 01/09/15 1719 01/10/15 1334 01/11/15 0551  01/12/15 0428  NA 124* 127* 130* 135  K 3.1* 2.9* 3.2* 3.2*  CL 95* 96* 102 109  CO2 17* 16* 16* 15*  GLUCOSE 146* 114* 95 115*  BUN 48* 52* 57* 58*  CREATININE 5.34* 5.87*  5.88* 6.14* 6.48*  CALCIUM 7.4* 7.6* 7.1* 7.4*  PHOS 7.5*  --   --   --      CBC:  Recent Labs Lab 01/08/15 1317 01/10/15 1334 01/11/15 0551 01/12/15 0428  WBC 4.7 4.0 2.6* 2.0*  NEUTROABS 3.4 3.0  --   --   HGB 8.7* 8.4* 7.3* 7.5*  HCT 26.5* 25.2* 21.7* 21.7*  MCV 90.9 88.8 88.1 88.8  PLT 68* 57* 45* 50*      Microbiology:  No results found for this or any previous visit (from the past 720 hour(s)).  Coagulation Studies: No results for input(s): LABPROT, INR in the last 72 hours.  Urinalysis: No results for input(s): COLORURINE, LABSPEC, PHURINE, GLUCOSEU, HGBUR, BILIRUBINUR, KETONESUR, PROTEINUR, UROBILINOGEN, NITRITE, LEUKOCYTESUR in the last 72 hours.  Invalid input(s): APPERANCEUR    Imaging: US Renal  01/11/2015   CLINICAL DATA:  Acute on chronic renal failure.  EXAM: RENAL / URINARY TRACT ULTRASOUND COMPLETE  COMPARISON:  10/22/2014 CT  FINDINGS: Right Kidney:  Length: 11.6 cm. Diffusely increased echogenicity noted. There is no evidence of hydronephrosis or solid mass.  Left Kidney:  Length: 12.1 cm.  Diffusely increased echogenicity noted. There is no evidence of hydronephrosis or solid mass. A 4 mm echogenic focus within the mid-lower left kidney may represent a nonobstructing calculus.  Bladder:  Appears normal for degree of bladder distention.  Incidentally noted is cirrhosis and small amount of ascites.  IMPRESSION: Increased renal echogenicity compatible with medical renal disease. Normal sized kidneys. No evidence of hydronephrosis.  Possible nonobstructing 4 mm mid-lower left renal calculus.  Cirrhosis and small amount of ascites.   Electronically Signed   By: Harmon Pier M.D.   On: 01/11/2015 11:26     Medications:   . sodium chloride Stopped (01/11/15 1115)   . arformoterol   15 mcg Nebulization BID  . isosorbide mononitrate  60 mg Oral Daily  . magnesium oxide  400 mg Oral Daily  . multivitamin with minerals  1 tablet Oral Daily  . pantoprazole  40 mg Oral Daily  . zinc sulfate   Oral Daily   acetaminophen **OR** acetaminophen, albuterol, ALPRAZolam, loratadine, nitroGLYCERIN, ondansetron **OR** ondansetron (ZOFRAN) IV  Assessment/ Plan:  81 y.o. female With history of cirrhosis of unknown etiology, Crohn's disease status post surgery which required parts of small and large intestines removed, coronary disease, angioplasty and stent, congestive heart failure, history of kidney stones, history of ureteral stent, history of frequent UTIs, chronic kidney disease with baseline creatinine 2.13/GFR 21  1. Acute renal failure, on CKD st 4 (B GFR 21/Cr 2.1)likely secondary to ATN from overdiuresis/dehydration. At present, Oral intake appears to be adequate. No nausea or vomiting. No shortness of breath. Agree with holding diuretics Electrolytes and Volume status are acceptable No acute indication for Dialysis at present  2. Hypokalemia May provide oral supplement Will follow    LOS: 2 Brandi Cline 10/2/201610:32 AM

## 2015-01-13 LAB — RENAL FUNCTION PANEL
ALBUMIN: 2.8 g/dL — AB (ref 3.5–5.0)
ANION GAP: 11 (ref 5–15)
BUN: 60 mg/dL — ABNORMAL HIGH (ref 6–20)
CALCIUM: 7.6 mg/dL — AB (ref 8.9–10.3)
CO2: 16 mmol/L — ABNORMAL LOW (ref 22–32)
Chloride: 106 mmol/L (ref 101–111)
Creatinine, Ser: 6.57 mg/dL — ABNORMAL HIGH (ref 0.44–1.00)
GFR, EST AFRICAN AMERICAN: 6 mL/min — AB (ref >60)
GFR, EST NON AFRICAN AMERICAN: 5 mL/min — AB (ref >60)
Glucose, Bld: 85 mg/dL (ref 65–99)
PHOSPHORUS: 6.1 mg/dL — AB (ref 2.5–4.6)
POTASSIUM: 3 mmol/L — AB (ref 3.5–5.1)
SODIUM: 133 mmol/L — AB (ref 135–145)

## 2015-01-13 MED ORDER — BENZONATATE 100 MG PO CAPS
100.0000 mg | ORAL_CAPSULE | Freq: Three times a day (TID) | ORAL | Status: DC | PRN
  Administered 2015-01-14 – 2015-01-15 (×3): 100 mg via ORAL
  Filled 2015-01-13 (×3): qty 1

## 2015-01-13 NOTE — Care Management Important Message (Signed)
Important Message  Patient Details  Name: Brandi Cline MRN: [id number] Date of Birth: Oct 28, 1930   Medicare Important Message Given:  Yes-second notification given    Adonis Huguenin, RN 01/13/2015, 11:11 AM

## 2015-01-13 NOTE — Care Management (Signed)
Spoke with patient for discharge planning. Patient is from home alone and independent. Has Home O2 with [employer]. Stated that she has family support from her sister that lives within walking distance. No CM needs identified at this time. Continue to follow.

## 2015-01-13 NOTE — Progress Notes (Signed)
[facility] - [place] at [facility]   PATIENT NAME: Brandi Cline    MR#:  [id number]  DATE OF BIRTH:  05-17-1930  SUBJECTIVE: Seen today, patient denies shortness of breath. But complains of for decreased urination since yesterday. Does have poor appetite.   CHIEF COMPLAINT:  No chief complaint on file.   REVIEW OF SYSTEMS:   ROS CONSTITUTIONAL: No fever, fatigue or weakness.  EYES: No blurred or double vision.  EARS, NOSE, AND THROAT: No tinnitus or ear pain.  RESPIRATORY: No cough, shortness of breath, wheezing or hemoptysis.  CARDIOVASCULAR: No chest pain, orthopnea, edema.  GASTROINTESTINAL: No nausea, vomiting, diarrhea or abdominal pain.  GENITOURINARY: Decreased urine amount ENDOCRINE: No polyuria, nocturia,  HEMATOLOGY: No anemia, easy bruising or bleeding SKIN: No rash or lesion. MUSCULOSKELETAL: No joint pain or arthritis.   NEUROLOGIC: No tingling, numbness, weakness.  PSYCHIATRY: No anxiety or depression.   DRUG ALLERGIES:   Allergies  Allergen Reactions  . Avelox [Moxifloxacin Hcl In Nacl] Shortness Of Breath, Swelling and Rash  . Codeine Shortness Of Breath and Other (See Comments)    Couldn't breath  . Theophyllines     Slight increased heart rate  . Ciprofloxacin Other (See Comments)    nervous  . Milk-Related Compounds Other (See Comments)    Whole milk hurts stomach. Not all dairy products do this.  . Tape     Please use paper tape only  . Lisinopril Rash    VITALS:  Blood pressure 100/58, pulse 61, temperature 97.8 F (36.6 C), temperature source Oral, resp. rate 17, height  (1.651 m), weight 62.823 kg (138 lb 8 oz), SpO2 98 %.  PHYSICAL EXAMINATION:  GENERAL:  81 y.o.-year-old patient lying in the bed with no acute distress.  EYES: Pupils equal, round, reactive to light and accommodation. No scleral icterus. Extraocular muscles intact.  HEENT: Head atraumatic, normocephalic. Oropharynx and nasopharynx clear.  NECK:   Supple, no jugular venous distention. No thyroid enlargement, no tenderness.  LUNGS ; decreased breath sounds at bases.no rales,rhonchi or crepitation. No use of accessory muscles of respiration.  CARDIOVASCULAR: S1, S2 normal. No murmurs, rubs, or gallops.  ABDOMEN: Soft, nontender, nondistended. Bowel sounds present. No organomegaly or mass.  EXTREMITIES: biLateral extremity edema present NEUROLOGIC: Cranial nerves II through XII are intact. Muscle strength 5/5 in all extremities. Sensation intact. Gait not checked.  PSYCHIATRIC: The patient is alert and oriented x 3.  SKIN: No obvious rash, lesion, or ulcer.    LABORATORY PANEL:   CBC  Recent Labs Lab 01/12/15 0428  WBC 2.0*  HGB 7.5*  HCT 21.7*  PLT 50*   ------------------------------------------------------------------------------------------------------------------  Chemistries   Recent Labs Lab 01/13/15 0651  NA 133*  K 3.0*  CL 106  CO2 16*  GLUCOSE 85  BUN 60*  CREATININE 6.57*  CALCIUM 7.6*   ------------------------------------------------------------------------------------------------------------------  Cardiac Enzymes No results for input(s): TROPONINI in the last 168 hours. ------------------------------------------------------------------------------------------------------------------  RADIOLOGY:  US Renal  01/11/2015   CLINICAL DATA:  Acute on chronic renal failure.  EXAM: RENAL / URINARY TRACT ULTRASOUND COMPLETE  COMPARISON:  10/22/2014 CT  FINDINGS: Right Kidney:  Length: 11.6 cm. Diffusely increased echogenicity noted. There is no evidence of hydronephrosis or solid mass.  Left Kidney:  Length: 12.1 cm. Diffusely increased echogenicity noted. There is no evidence of hydronephrosis or solid mass. A 4 mm echogenic focus within the mid-lower left kidney may represent a nonobstructing calculus.  Bladder:  Appears normal for degree of bladder  distention.  Incidentally noted is cirrhosis and small amount  of ascites.  IMPRESSION: Increased renal echogenicity compatible with medical renal disease. Normal sized kidneys. No evidence of hydronephrosis.  Possible nonobstructing 4 mm mid-lower left renal calculus.  Cirrhosis and small amount of ascites.   Electronically Signed   By: Harmon Pier M.D.   On: 01/11/2015 11:26    EKG:   Orders placed or performed during the hospital encounter of 09/19/13  . EKG 12-Lead  . EKG 12-Lead  . EKG    ASSESSMENT AND PLAN:   : Acute renal failure on now chronic kidney disease stage 4; secondary to  ATN from over diuresis/dehydration. Oliguric renal failure. Worsening renal failure, rising creatinine: Appreciate nephrology input. Patient  Lasix, Aldactone are  Held,  . Has metabolic acidosis with bicarbonate was 16: Renal sonogram for medical renal disease. Patient volume status is acceptable .electrolytes   are acceptable. Nephrology following #2 chronic respiratory failure secondary to COPD: Continue oxygen. #3 anxiety disorder continue Xanax. 4. hyponatremia, hypokalemia ; improving. #5 pancytopenia with the anemia, low platelets of 45. Looks like chronic. No evidence of bleeding: Watch for hematuria, obvious signs of bleeding.  #6. history of coronary artery disease and stents; and in urinary Imdur. Hold losartan secondary to renal failure.  All the records are reviewed and case discussed with Care Management/Social Workerr. Management plans discussed with the patient, family and they are in agreement.  CODE STATUS:full  TOTAL TIME TAKING CARE OF THIS PATIENT: 35  minutes.   POSSIBLE D/C IN  4-5 DAYS, DEPENDING ON CLINICAL CONDITION.   Katha Hamming M.D on 01/13/2015 at 10:59 AM  Between 7am to 6pm - Pager - [phone]  After 6pm go to [email or web address] - password [email or web address] [facility]  [place] [facility]  Office  [phone]  CC: Primary care physician; [name], MD

## 2015-01-13 NOTE — Progress Notes (Signed)
Attempted PIV insertion into right FA at approx 2230. On insertion of needle, pt jumped to try to catch something that was falling off of the bed and the vein blew under her skin. Large hematoma present on right FA after removal of catheter. Catheter intact. Pt denies any pain at this time. Placed ice onto right FA immediately after. Will continue to monitor.

## 2015-01-13 NOTE — Progress Notes (Signed)
Subjective:  UOP 100 Continues to have peripheral edema Creatinine 6.57 (6.48) Sodium 133 (135)  K 3 (3.2) Currently off IV fluids and diuretics.   Objective:  Vital signs in last 24 hours:  Temp:  [97.5 F (36.4 C)-97.8 F (36.6 C)] 97.8 F (36.6 C) (10/03 0406) Pulse Rate:  [61-69] 61 (10/03 0406) Resp:  [16-17] 17 (10/03 0406) BP: (100-130)/(40-75) 100/58 mmHg (10/03 0406) SpO2:  [94 %-100 %] 98 % (10/03 0740)  Weight change:  Filed Weights   01/10/15 1306  Weight: 62.823 kg (138 lb 8 oz)    Intake/Output:    Intake/Output Summary (Last 24 hours) at 01/13/15 1103 Last data filed at 01/13/15 0937  Gross per 24 hour  Intake    360 ml  Output    100 ml  Net    260 ml     Physical Exam: General:  no distress, sitting at the edge of the bed, elderly, frail   HEENT  moist oral mucous membranes, anicteric   Neck  supple   Pulm/lungs  normal effort, [place] oxygen 4Litres , clear to auscultation bilaterally   CVS/Heart  irregular rhythm, no rub or gallop   Abdomen:   soft, nontender, nondistended   Extremities:  2+ pitting edema bilaterally   Neurologic:  alert, oriented, nonfocal   Skin:  no acute rashes           Basic Metabolic Panel:   Recent Labs Lab 01/09/15 1719 01/10/15 1334 01/11/15 0551 01/12/15 0428 01/13/15 0651  NA 124* 127* 130* 135 133*  K 3.1* 2.9* 3.2* 3.2* 3.0*  CL 95* 96* 102 109 106  CO2 17* 16* 16* 15* 16*  GLUCOSE 146* 114* 95 115* 85  BUN 48* 52* 57* 58* 60*  CREATININE 5.34* 5.87*  5.88* 6.14* 6.48* 6.57*  CALCIUM 7.4* 7.6* 7.1* 7.4* 7.6*  PHOS 7.5*  --   --   --  6.1*     CBC:  Recent Labs Lab 01/08/15 1317 01/10/15 1334 01/11/15 0551 01/12/15 0428  WBC 4.7 4.0 2.6* 2.0*  NEUTROABS 3.4 3.0  --   --   HGB 8.7* 8.4* 7.3* 7.5*  HCT 26.5* 25.2* 21.7* 21.7*  MCV 90.9 88.8 88.1 88.8  PLT 68* 57* 45* 50*      Microbiology:  No results found for this or any previous visit (from the past 720 hour(s)).  Coagulation  Studies: No results for input(s): LABPROT, INR in the last 72 hours.  Urinalysis: No results for input(s): COLORURINE, LABSPEC, PHURINE, GLUCOSEU, HGBUR, BILIRUBINUR, KETONESUR, PROTEINUR, UROBILINOGEN, NITRITE, LEUKOCYTESUR in the last 72 hours.  Invalid input(s): APPERANCEUR    Imaging: US Renal  01/11/2015   CLINICAL DATA:  Acute on chronic renal failure.  EXAM: RENAL / URINARY TRACT ULTRASOUND COMPLETE  COMPARISON:  10/22/2014 CT  FINDINGS: Right Kidney:  Length: 11.6 cm. Diffusely increased echogenicity noted. There is no evidence of hydronephrosis or solid mass.  Left Kidney:  Length: 12.1 cm. Diffusely increased echogenicity noted. There is no evidence of hydronephrosis or solid mass. A 4 mm echogenic focus within the mid-lower left kidney may represent a nonobstructing calculus.  Bladder:  Appears normal for degree of bladder distention.  Incidentally noted is cirrhosis and small amount of ascites.  IMPRESSION: Increased renal echogenicity compatible with medical renal disease. Normal sized kidneys. No evidence of hydronephrosis.  Possible nonobstructing 4 mm mid-lower left renal calculus.  Cirrhosis and small amount of ascites.   Electronically Signed   By: Henrietta Hoover.D.  On: 01/11/2015 11:26     Medications:     . arformoterol  15 mcg Nebulization BID  . isosorbide mononitrate  60 mg Oral Daily  . multivitamin with minerals  1 tablet Oral Daily  . pantoprazole  40 mg Oral Daily  . zinc sulfate   Oral Daily   acetaminophen **OR** acetaminophen, albuterol, ALPRAZolam, loratadine, nitroGLYCERIN, ondansetron **OR** ondansetron (ZOFRAN) IV  Assessment/ Plan:  81 y.o. white female with hepatic cirrhosis of unknown etiology, Crohn's disease status post surgery which required parts of small and large intestines removed, coronary disease, angioplasty and stent, congestive heart failure, history of kidney stones, history of ureteral stent, history of frequent UTIs, chronic kidney  disease with baseline creatinine 2.13/GFR 21  1. Acute renal failure, on CKD st 4 (Baseline GFR 21/Cr 2.1)likely secondary to ATN from overdiuresis/dehydration. However Creatinine seems to have platueaed. Urine output only recorded at 100. Not with uremic symptoms. No acute indication for dialysis.  - Agree with holding IV fluids and diuretics. Will need to monitor.  - If creatinine starts to improve, may discharge on low dose diuretics.  - However if no improvement in renal function: will need to consider dialysis.  2. Metabolic Acidosis: secondary to chronic kidney disease. Usually overdiuresis gives an alkalotic picture.  - low threshold to start sodium bicarbonate.   3. Hyponatremia: secondary to volume overload and improved with IV fluids. Furosemide leads to sodium retention.   4. Hypokalemia: improved with oral potassium supplements.     LOS: 3 Vincent Ehrler 10/3/201611:03 AM

## 2015-01-14 LAB — BASIC METABOLIC PANEL
Anion gap: 11 (ref 5–15)
BUN: 64 mg/dL — AB (ref 6–20)
CHLORIDE: 104 mmol/L (ref 101–111)
CO2: 17 mmol/L — ABNORMAL LOW (ref 22–32)
CREATININE: 6.77 mg/dL — AB (ref 0.44–1.00)
Calcium: 7.7 mg/dL — ABNORMAL LOW (ref 8.9–10.3)
GFR calc Af Amer: 6 mL/min — ABNORMAL LOW (ref >60)
GFR calc non Af Amer: 5 mL/min — ABNORMAL LOW (ref >60)
GLUCOSE: 114 mg/dL — AB (ref 65–99)
Potassium: 3.1 mmol/L — ABNORMAL LOW (ref 3.5–5.1)
SODIUM: 132 mmol/L — AB (ref 135–145)

## 2015-01-14 MED ORDER — POTASSIUM CHLORIDE CRYS ER 20 MEQ PO TBCR
20.0000 meq | EXTENDED_RELEASE_TABLET | Freq: Once | ORAL | Status: AC
  Administered 2015-01-14: 20 meq via ORAL
  Filled 2015-01-14: qty 1

## 2015-01-14 MED ORDER — SODIUM BICARBONATE 650 MG PO TABS
650.0000 mg | ORAL_TABLET | Freq: Two times a day (BID) | ORAL | Status: DC
  Administered 2015-01-14 – 2015-01-15 (×3): 650 mg via ORAL
  Filled 2015-01-14 (×3): qty 1

## 2015-01-14 MED ORDER — LOPERAMIDE HCL 2 MG PO CAPS
4.0000 mg | ORAL_CAPSULE | Freq: Once | ORAL | Status: AC
  Administered 2015-01-14: 4 mg via ORAL
  Filled 2015-01-14: qty 2

## 2015-01-14 NOTE — Care Management (Addendum)
Discussed case with Dr Wynelle Link. Spoke with patient concerning home health RN and possibility of tele-heatlh monitor. Patient already has O2 with [employer] at home. Patient lives alone and is independent. Patient is open to having an RN come to the house a few times a week. Also may qualify for Tele-health and she thinks this would be a good idea. Patient has no preference of HH providers but wanted to insure that she does not have a co-pay. [facility] unable to take patient, [facility] is able to take patient without co pay. Patient is agreeable to this plan, Refferal placed with Becky Sax at [facility]. Patient will need MD orders for RN Tele-health and OT for energy and O2 conservation.

## 2015-01-14 NOTE — Progress Notes (Signed)
Subjective:  Creatinine 6.77 (6.57) Continues to have peripheral edema  Daughter is concerned about patient going home.   Objective:  Vital signs in last 24 hours:  Temp:  [97.5 F (36.4 C)-98.1 F (36.7 C)] 98.1 F (36.7 C) (10/04 0547) Pulse Rate:  [63-95] 95 (10/04 0547) Resp:  [17] 17 (10/04 0547) BP: (105-151)/(33-67) 151/67 mmHg (10/04 0547) SpO2:  [97 %-100 %] 97 % (10/04 0740)  Weight change:  Filed Weights   01/10/15 1306  Weight: 62.823 kg (138 lb 8 oz)    Intake/Output:    Intake/Output Summary (Last 24 hours) at 01/14/15 1209 Last data filed at 01/14/15 1038  Gross per 24 hour  Intake    960 ml  Output    150 ml  Net    810 ml     Physical Exam: General:  no distress, sitting at the edge of the bed, elderly, frail   HEENT  moist oral mucous membranes, anicteric   Neck  supple   Pulm/lungs  normal effort, [place] oxygen 4Litres , bilateral wheezing  CVS/Heart  irregular rhythm, no rub or gallop   Abdomen:   soft, nontender, nondistended   Extremities:  2+ pitting edema bilaterally   Neurologic:  alert, oriented, nonfocal   Skin:  no acute rashes           Basic Metabolic Panel:   Recent Labs Lab 01/09/15 1719 01/10/15 1334 01/11/15 0551 01/12/15 0428 01/13/15 0651 01/14/15 0441  NA 124* 127* 130* 135 133* 132*  K 3.1* 2.9* 3.2* 3.2* 3.0* 3.1*  CL 95* 96* 102 109 106 104  CO2 17* 16* 16* 15* 16* 17*  GLUCOSE 146* 114* 95 115* 85 114*  BUN 48* 52* 57* 58* 60* 64*  CREATININE 5.34* 5.87*  5.88* 6.14* 6.48* 6.57* 6.77*  CALCIUM 7.4* 7.6* 7.1* 7.4* 7.6* 7.7*  PHOS 7.5*  --   --   --  6.1*  --      CBC:  Recent Labs Lab 01/08/15 1317 01/10/15 1334 01/11/15 0551 01/12/15 0428  WBC 4.7 4.0 2.6* 2.0*  NEUTROABS 3.4 3.0  --   --   HGB 8.7* 8.4* 7.3* 7.5*  HCT 26.5* 25.2* 21.7* 21.7*  MCV 90.9 88.8 88.1 88.8  PLT 68* 57* 45* 50*      Microbiology:  No results found for this or any previous visit (from the past 720  hour(s)).  Coagulation Studies: No results for input(s): LABPROT, INR in the last 72 hours.  Urinalysis: No results for input(s): COLORURINE, LABSPEC, PHURINE, GLUCOSEU, HGBUR, BILIRUBINUR, KETONESUR, PROTEINUR, UROBILINOGEN, NITRITE, LEUKOCYTESUR in the last 72 hours.  Invalid input(s): APPERANCEUR    Imaging: No results found.   Medications:     . arformoterol  15 mcg Nebulization BID  . isosorbide mononitrate  60 mg Oral Daily  . multivitamin with minerals  1 tablet Oral Daily  . pantoprazole  40 mg Oral Daily  . potassium chloride  20 mEq Oral Once  . zinc sulfate   Oral Daily   acetaminophen **OR** acetaminophen, albuterol, ALPRAZolam, benzonatate, loratadine, nitroGLYCERIN, ondansetron **OR** ondansetron (ZOFRAN) IV  Assessment/ Plan:  81 y.o. white female with hepatic cirrhosis of unknown etiology, Crohn's disease status post surgery which required parts of small and large intestines removed, coronary disease, angioplasty and stent, congestive heart failure, history of kidney stones, history of ureteral stent, history of frequent UTIs, chronic kidney disease with baseline creatinine 2.13/GFR 21  1. Acute renal failure, on CKD st 4 (Baseline GFR 21/Cr  2.1)likely secondary to ATN from overdiuresis/dehydration. However Creatinine seems to have platueaed. Urine output only recorded at 250. Not with uremic symptoms. No acute indication for dialysis.  - Agree with holding IV fluids and diuretics. Will need to monitor.  - If creatinine starts to improve, may discharge on low dose diuretics.  - However if no improvement in renal function: will need to consider dialysis.  2. Metabolic Acidosis: secondary to chronic kidney disease. Usually overdiuresis gives an alkalotic picture.  - start sodium bicarbonate.   3. Hyponatremia: secondary to volume overload and improved with IV fluids. Furosemide leads to sodium retention. stable at 132  4. Hypokalemia: improved with oral potassium  supplements.     LOS: 4 Dyron Kawano 10/4/201612:09 PM

## 2015-01-14 NOTE — Progress Notes (Signed)
[facility] - [place] at [facility]   PATIENT NAME: Brandi Cline    MR#:  [id number]  DATE OF BIRTH:  Sep 27, 1930  SUBJECTIVE: seen at bed side.denies any complaint.renal functioning is worse. No sob.no nausea/uop only 540 ml yesterda  CHIEF COMPLAINT:  No chief complaint on file.   REVIEW OF SYSTEMS:   ROS CONSTITUTIONAL: No fever, fatigue or weakness.  EYES: No blurred or double vision.  EARS, NOSE, AND THROAT: No tinnitus or ear pain.  RESPIRATORY: No cough, shortness of breath, wheezing or hemoptysis.  CARDIOVASCULAR: No chest pain, orthopnea, edema.  GASTROINTESTINAL: No nausea, vomiting, diarrhea or abdominal pain.  GENITOURINARY: No dysuria, hematuria.  ENDOCRINE: No polyuria, nocturia,  HEMATOLOGY: No anemia, easy bruising or bleeding SKIN: No rash or lesion. MUSCULOSKELETAL: No joint pain or arthritis.   NEUROLOGIC: No tingling, numbness, weakness.  PSYCHIATRY: No anxiety or depression.   DRUG ALLERGIES:   Allergies  Allergen Reactions  . Avelox [Moxifloxacin Hcl In Nacl] Shortness Of Breath, Swelling and Rash  . Codeine Shortness Of Breath and Other (See Comments)    Couldn't breath  . Theophyllines     Slight increased heart rate  . Ciprofloxacin Other (See Comments)    nervous  . Milk-Related Compounds Other (See Comments)    Whole milk hurts stomach. Not all dairy products do this.  . Tape     Please use paper tape only  . Lisinopril Rash    VITALS:  Blood pressure 151/67, pulse 95, temperature 98.1 F (36.7 C), temperature source Oral, resp. rate 17, height 5\' 5"  (1.651 m), weight 62.823 kg (138 lb 8 oz), SpO2 97 %.  PHYSICAL EXAMINATION:  GENERAL:  81 y.o.-year-old patient lying in the bed with no acute distress.  EYES: Pupils equal, round, reactive to light and accommodation. No scleral icterus. Extraocular muscles intact.  HEENT: Head atraumatic, normocephalic. Oropharynx and nasopharynx clear.  NECK:  Supple, no jugular  venous distention. No thyroid enlargement, no tenderness.  LUNGS ; decreased breath sounds at bases.no rales,rhonchi or crepitation. No use of accessory muscles of respiration.  CARDIOVASCULAR: S1, S2 normal. No murmurs, rubs, or gallops.  ABDOMEN: Soft, nontender, nondistended. Bowel sounds present. No organomegaly or mass.  EXTREMITIES: biLateral extremity edema present NEUROLOGIC: Cranial nerves II through XII are intact. Muscle strength 5/5 in all extremities. Sensation intact. Gait not checked.  PSYCHIATRIC: The patient is alert and oriented x 3.  SKIN: No obvious rash, lesion, or ulcer.    LABORATORY PANEL:   CBC  Recent Labs Lab 01/12/15 0428  WBC 2.0*  HGB 7.5*  HCT 21.7*  PLT 50*   ------------------------------------------------------------------------------------------------------------------  Chemistries   Recent Labs Lab 01/14/15 0441  NA 132*  K 3.1*  CL 104  CO2 17*  GLUCOSE 114*  BUN 64*  CREATININE 6.77*  CALCIUM 7.7*   ------------------------------------------------------------------------------------------------------------------  Cardiac Enzymes No results for input(s): TROPONINI in the last 168 hours. ------------------------------------------------------------------------------------------------------------------  RADIOLOGY:  No results found.  EKG:   Orders placed or performed during the hospital encounter of 09/19/13  . EKG 12-Lead  . EKG 12-Lead  . EKG    ASSESSMENT AND PLAN:   : Acute renal failure on now chronic kidney disease stage 4; secondary to  ATN from over diuresis/dehydration. Oliguric renal failure. Has worsening renal failure , with poor urine output, Patient  Lasix, Aldactone are  Held, appreciate nephrology following, patient may need hemodialysis. [name] Has metabolic acidosis with bicarbonate was 16: Renal sonogram for medical renal disease. Patient  volume status is acceptable .electrolytes   are acceptable. Nephrology  following #2 chronic respiratory failure secondary to COPD: Continue oxygen. #3 anxiety disorder continue Xanax. 4. hyponatremia, hypokalemia ; improving. #5 pancytopenia with the anemia, low platelets of 45. Looks like chronic. No evidence of bleeding: Watch for hematuria, obvious signs of bleeding.  #6. history of coronary artery disease and stents; #7 hypokalemia replace potassium. All the records are reviewed and case discussed with Care Management/Social Workerr. Management plans discussed with the patient, family and they are in agreement.  CODE STATUS:full  TOTAL TIME TAKING CARE OF THIS PATIENT: 35  minutes.   POSSIBLE D/C IN  4-5 DAYS, DEPENDING ON CLINICAL CONDITION.   Katha Hamming M.D on 01/14/2015 at 11:56 AM  Between 7am to 6pm - Pager - [phone]  After 6pm go to [email or web address] - password [email or web address] [email or web address]  [place] [facility]  Office  [phone]  CC: Primary care physician; [name], MD

## 2015-01-15 ENCOUNTER — Telehealth: Payer: Self-pay | Admitting: *Deleted

## 2015-01-15 LAB — BASIC METABOLIC PANEL
Anion gap: 13 (ref 5–15)
BUN: 65 mg/dL — AB (ref 6–20)
CHLORIDE: 105 mmol/L (ref 101–111)
CO2: 16 mmol/L — AB (ref 22–32)
CREATININE: 6.52 mg/dL — AB (ref 0.44–1.00)
Calcium: 8 mg/dL — ABNORMAL LOW (ref 8.9–10.3)
GFR calc Af Amer: 6 mL/min — ABNORMAL LOW (ref >60)
GFR calc non Af Amer: 5 mL/min — ABNORMAL LOW (ref >60)
GLUCOSE: 83 mg/dL (ref 65–99)
POTASSIUM: 3.1 mmol/L — AB (ref 3.5–5.1)
Sodium: 134 mmol/L — ABNORMAL LOW (ref 135–145)

## 2015-01-15 NOTE — Discharge Summary (Signed)
[facility] - [place] at [facility]   PATIENT NAME: Brandi Cline    MR#:  [id number]  DATE OF BIRTH:  01/26/1931  DATE OF ADMISSION:  01/10/2015 ADMITTING PHYSICIAN: Auburn Bilberry, MD  DATE OF DISCHARGE: 01/15/2015  PRIMARY CARE PHYSICIAN: [name], MD    ADMISSION DIAGNOSIS:  Acute Renal Failure  DISCHARGE DIAGNOSIS:  Active Problems:   Acute renal failure (HCC)   SECONDARY DIAGNOSIS:   Past Medical History  Diagnosis Date  . Dyspnea   . Esophageal reflux   . CAD (coronary artery disease)   . Hypertension   . Crohn's disease   . Allergic rhinitis   . COPD (chronic obstructive pulmonary disease)   . History of kidney stones   . History of shingles   . Trigeminal neuralgia   . Family history of anesthesia complication     " SISTER HAD BAD FEELINGS "  . Anginal pain   . Anxiety     ' JUST FOR MY BREATHING "  . Arthritis     mild in hands  . Hyperlipidemia   . CKD (chronic kidney disease)     HOSPITAL COURSE:   1. Acute renal failure on chronic kidney disease stage III. Patient was watched in the hospital for 6 days without much change in her creatinine. Since the patient was not having uremic symptoms, nephrology felt that she was stable for discharge home and follow-up with Dr. Thedore Mins nephrology in 1 week. She will have blood work in the office on Friday. Creatinine upon discharge 6.52 with a GFR of 5. I will continue to hold Lasix, losartan and potassium. 2. 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 History of Crohn's disease 4. History of gastroesophageal reflux disease- on PPI 5. Anxiety- on Xanax  DISCHARGE CONDITIONS:   Satisfactory  CONSULTS OBTAINED:  Treatment Team:  Mosetta Pigeon, MD  DRUG ALLERGIES:   Allergies  Allergen Reactions  . Avelox [Moxifloxacin Hcl In Nacl] Shortness Of Breath, Swelling and Rash  . Codeine Shortness Of Breath and Other (See Comments)    Couldn't breath  .  Theophyllines     Slight increased heart rate  . Ciprofloxacin Other (See Comments)    nervous  . Milk-Related Compounds Other (See Comments)    Whole milk hurts stomach. Not all dairy products do this.  . Tape     Please use paper tape only  . Lisinopril Rash    DISCHARGE MEDICATIONS:   Current Discharge Medication List    CONTINUE these medications which have NOT CHANGED   Details  albuterol (PROVENTIL HFA;VENTOLIN HFA) 108 (90 BASE) MCG/ACT inhaler Inhale 2 puffs into the lungs every 6 (six) hours as needed for wheezing or shortness of breath. Qty: 1 Inhaler, Refills: 11    albuterol (PROVENTIL) (2.5 MG/3ML) 0.083% nebulizer solution Take 3 mLs (2.5 mg total) by nebulization every 4 (four) hours as needed. Shortness of breath Qty: 75 mL, Refills: 11    ALPRAZolam (XANAX) 0.5 MG tablet TAKE ONE TABLET BY MOUTH THREE TIMES DAILY AS NEEDED FOR ANXIETY Qty: 270 tablet, Refills: 0    arformoterol (BROVANA) 15 MCG/2ML NEBU Take 2 mLs (15 mcg total) by nebulization 2 (two) times daily. Qty: 60 mL, Refills: prn    BIOTIN 5000 PO Take 2 tablets by mouth daily.    Cholecalciferol (VITAMIN D-3) 5000 UNITS TABS Take 1 tablet by mouth daily.    isosorbide mononitrate (IMDUR) 30 MG 24 hr tablet TAKE TWO  TABLETS BY MOUTH DAILY Qty: 60 tablet, Refills: 11    loratadine (CLARITIN) 10 MG tablet Take 10 mg by mouth daily as needed.     magnesium oxide (MAG-OX) 400 MG tablet Take 400 mg by mouth daily.     Multiple Vitamin (MULTIVITAMIN) tablet Take 1 tablet by mouth daily.     nitroGLYCERIN (NITROSTAT) 0.4 MG SL tablet Place 1 tablet (0.4 mg total) under the tongue every 5 (five) minutes as needed. Chest pain Qty: 25 tablet, Refills: 3    omeprazole (PRILOSEC) 20 MG capsule TAKE ONE CAPSULE BY MOUTH DAILY Qty: 90 capsule, Refills: 3    Zinc Acetate, Oral, (ZINC ACETATE PO) Take 1 tablet by mouth daily.      STOP taking these medications     Apple Cider Vinegar 500 MG TABS       furosemide (LASIX) 20 MG tablet          DISCHARGE INSTRUCTIONS:  Follow-up with Dr. Thedore Mins in one week- with blood test on Friday Follow-up PMD in 2 weeks  If you experience worsening of your admission symptoms, develop shortness of breath, life threatening emergency, suicidal or homicidal thoughts you must seek medical attention immediately by calling 911 or calling your MD immediately  if symptoms less severe.  You Must read complete instructions/literature along with all the possible adverse reactions/side effects for all the Medicines you take and that have been prescribed to you. Take any new Medicines after you have completely understood and accept all the possible adverse reactions/side effects.   Please note  You were cared for by a hospitalist during your hospital stay. If you have any questions about your discharge medications or the care you received while you were in the hospital after you are discharged, you can call the unit and asked to speak with the hospitalist on call if the hospitalist that took care of you is not available. Once you are discharged, your primary care physician will handle any further medical issues. Please note that NO REFILLS for any discharge medications will be authorized once you are discharged, as it is imperative that you return to your primary care physician (or establish a relationship with a primary care physician if you do not have one) for your aftercare needs so that they can reassess your need for medications and monitor your lab values.    Today   CHIEF COMPLAINT:  No chief complaint on file.   HISTORY OF PRESENT ILLNESS:  Brandi Cline  is a 81 y.o. female with a known history of chronic kidney disease. She presented with acute on chronic renal failure   VITAL SIGNS:  Blood pressure 116/42, pulse 68, temperature 98.2 F (36.8 C), temperature source Oral, resp. rate 18, height  (1.651 m), weight 62.823 kg (138 lb 8 oz), SpO2 94  %.   PHYSICAL EXAMINATION:  GENERAL:  81 y.o.-year-old patient lying in the bed with no acute distress.  EYES: Pupils equal, round, reactive to light and accommodation. No scleral icterus. Extraocular muscles intact.  HEENT: Head atraumatic, normocephalic. Oropharynx and nasopharynx clear.  NECK:  Supple, no jugular venous distention. No thyroid enlargement, no tenderness.  LUNGS: Normal breath sounds bilaterally, no wheezing, rales,rhonchi or crepitation. No use of accessory muscles of respiration.  CARDIOVASCULAR: S1, S2 normal. No murmurs, rubs, or gallops.  ABDOMEN: Soft, non-tender, non-distended. Bowel sounds present. No organomegaly or mass.  EXTREMITIES: Trace edema, no cyanosis, or clubbing.  NEUROLOGIC: Cranial nerves II through XII are intact. Muscle strength  5/5 in all extremities. Sensation intact. Gait not checked.  PSYCHIATRIC: The patient is alert and oriented x 3.  SKIN: No obvious rash, lesion, or ulcer.   DATA REVIEW:   CBC  Recent Labs Lab 01/12/15 0428  WBC 2.0*  HGB 7.5*  HCT 21.7*  PLT 50*    Chemistries   Recent Labs Lab 01/15/15 0459  NA 134*  K 3.1*  CL 105  CO2 16*  GLUCOSE 83  BUN 65*  CREATININE 6.52*  CALCIUM 8.0*    Management plans discussed with the patient, and daughter on phone and they are in agreement.  CODE STATUS:     Code Status Orders        Start     Ordered   01/10/15 1247  Full code   Continuous     01/10/15 1247    Advance Directive Documentation        Most Recent Value   Type of Advance Directive  Living will   Pre-existing out of facility DNR order (yellow form or pink MOST form)     "MOST" Form in Place?        TOTAL TIME TAKING CARE OF THIS PATIENT: 35 minutes. Greater than 50% time spent in coordination of care and plans upon discharge.   Alford Highland M.D on 01/15/2015 at 10:27 AM  Between 7am to 6pm - Pager - [phone]  After 6pm go to [email or web address] - password [email or web address] [facility]  [facility]  Office  [phone]  CC: Primary care physician; [name], MD

## 2015-01-15 NOTE — Progress Notes (Signed)
Subjective:  Creatinine 6.52 (6.77)   Patient states she is feeling better. She wants to go home.   Objective:  Vital signs in last 24 hours:  Temp:  [98.1 F (36.7 C)-98.2 F (36.8 C)] 98.2 F (36.8 C) (10/05 0436) Pulse Rate:  [68-79] 68 (10/05 0436) Resp:  [18] 18 (10/04 2039) BP: (104-130)/(33-47) 116/42 mmHg (10/05 0449) SpO2:  [94 %-100 %] 94 % (10/05 0700) FiO2 (%):  [36 %] 36 % (10/05 0700)  Weight change:  Filed Weights   01/10/15 1306  Weight: 62.823 kg (138 lb 8 oz)    Intake/Output:    Intake/Output Summary (Last 24 hours) at 01/15/15 1011 Last data filed at 01/15/15 0515  Gross per 24 hour  Intake   1620 ml  Output    328 ml  Net   1292 ml     Physical Exam: General:  no distress, sitting at the edge of the bed, elderly, frail   HEENT  moist oral mucous membranes, anicteric   Neck  supple   Pulm/lungs  normal effort, [place] oxygen 4Litres , bilateral wheezing  CVS/Heart  irregular rhythm, no rub or gallop   Abdomen:   soft, nontender, nondistended   Extremities:  2+ pitting edema bilaterally   Neurologic:  alert, oriented, nonfocal   Skin:  no acute rashes           Basic Metabolic Panel:   Recent Labs Lab 01/09/15 1719  01/11/15 0551 01/12/15 0428 01/13/15 0651 01/14/15 0441 01/15/15 0459  NA 124*  < > 130* 135 133* 132* 134*  K 3.1*  < > 3.2* 3.2* 3.0* 3.1* 3.1*  CL 95*  < > 102 109 106 104 105  CO2 17*  < > 16* 15* 16* 17* 16*  GLUCOSE 146*  < > 95 115* 85 114* 83  BUN 48*  < > 57* 58* 60* 64* 65*  CREATININE 5.34*  < > 6.14* 6.48* 6.57* 6.77* 6.52*  CALCIUM 7.4*  < > 7.1* 7.4* 7.6* 7.7* 8.0*  PHOS 7.5*  --   --   --  6.1*  --   --   < > = values in this interval not displayed.   CBC:  Recent Labs Lab 01/08/15 1317 01/10/15 1334 01/11/15 0551 01/12/15 0428  WBC 4.7 4.0 2.6* 2.0*  NEUTROABS 3.4 3.0  --   --   HGB 8.7* 8.4* 7.3* 7.5*  HCT 26.5* 25.2* 21.7* 21.7*  MCV 90.9 88.8 88.1 88.8  PLT 68* 57* 45* 50*       Microbiology:  No results found for this or any previous visit (from the past 720 hour(s)).  Coagulation Studies: No results for input(s): LABPROT, INR in the last 72 hours.  Urinalysis: No results for input(s): COLORURINE, LABSPEC, PHURINE, GLUCOSEU, HGBUR, BILIRUBINUR, KETONESUR, PROTEINUR, UROBILINOGEN, NITRITE, LEUKOCYTESUR in the last 72 hours.  Invalid input(s): APPERANCEUR    Imaging: No results found.   Medications:     . arformoterol  15 mcg Nebulization BID  . isosorbide mononitrate  60 mg Oral Daily  . multivitamin with minerals  1 tablet Oral Daily  . pantoprazole  40 mg Oral Daily  . sodium bicarbonate  650 mg Oral BID  . zinc sulfate   Oral Daily   acetaminophen **OR** acetaminophen, albuterol, ALPRAZolam, benzonatate, loratadine, nitroGLYCERIN, ondansetron **OR** ondansetron (ZOFRAN) IV  Assessment/ Plan:  81 y.o. white female with hepatic cirrhosis of unknown etiology, Crohn's disease status post surgery which required parts of small and large intestines  removed, coronary disease, angioplasty and stent, congestive heart failure, history of kidney stones, history of ureteral stent, history of frequent UTIs, chronic kidney disease with baseline creatinine 2.13/GFR 21  1. Acute renal failure, on CKD st 4 (Baseline GFR 21/Cr 2.1)likely secondary to ATN from overdiuresis/dehydration. However Creatinine seems to have platueaed.  No acute indication for dialysis. However creatinine is very high. Urine output has improved.  Recommend patient have her furosemide held at discharge.  - If patient to be discharged today, will have patient get labs on Friday and then have follow up with Dr. Thedore Mins, her primary nephrologist, next week.  - Discussed possibility of needing dialysis with patient.   2. Metabolic Acidosis: secondary to chronic kidney disease. Usually overdiuresis gives an alkalotic picture.  - started sodium bicarbonate.   3. Hyponatremia: secondary to  volume overload and improved with IV fluids. Furosemide leads to sodium retention. stable at 134.   4. Hypokalemia: improved with oral potassium supplements. 3.1    LOS: 5 Puneet Selden 10/5/201610:11 AM

## 2015-01-15 NOTE — Progress Notes (Signed)
Pt alert and oriented. Discharge summary given to pt, IV site removed. Concerns addressed. Discharged to home.

## 2015-01-15 NOTE — Telephone Encounter (Signed)
Transitional care call attempted.  Left message for patient to return call. 

## 2015-01-15 NOTE — Discharge Instructions (Signed)
Stop losartan, potassium and Lasix

## 2015-01-15 NOTE — Care Management (Signed)
Patient to be discharged today with home health.  Order for RN telehealth, OT, and Aide ordered.   Home health face to face completed by MD.  Elnita Maxwell from [facility] notified of discharge order.  Patient packet completed for [facility].  No further needs identified. RNCM signing off

## 2015-01-16 ENCOUNTER — Other Ambulatory Visit: Payer: Self-pay | Admitting: Family Medicine

## 2015-01-16 NOTE — Telephone Encounter (Signed)
Transition Care Management Follow-up Telephone Call   Date discharged? 01/15/15   How have you been since you were released from the hospital? Patient is still weak and with some SOB.   Do you understand why you were in the hospital? yes   Do you understand the discharge instructions? yes   Where were you discharged to? Home   Items Reviewed:  Medications reviewed: yes  Allergies reviewed: yes  Dietary changes reviewed: no  Referrals reviewed: no   Functional Questionnaire:   Activities of Daily Living (ADLs):   She states they are independent in the following: ambulation, bathing and hygiene, feeding, continence, grooming, toileting and dressing States they require assistance with the following: None   Any transportation issues/concerns?: no   Any patient concerns? no   Confirmed importance and date/time of follow-up visits scheduled yes, 01/21/15 @ 1130  Provider Appointment booked with [name], MD  Confirmed with patient if condition begins to worsen call PCP or go to the ER.  Patient was given the office number and encouraged to call back with question or concerns.  : yes

## 2015-01-16 NOTE — Telephone Encounter (Signed)
Last f/u appt 06/2014-CPE 

## 2015-01-16 NOTE — Telephone Encounter (Signed)
Rx called in to requested pharmacy 

## 2015-01-21 ENCOUNTER — Ambulatory Visit: Payer: Medicare HMO | Admitting: Family Medicine

## 2015-01-21 ENCOUNTER — Ambulatory Visit: Payer: Medicare HMO | Admitting: Gastroenterology

## 2015-01-22 ENCOUNTER — Inpatient Hospital Stay
Admission: AD | Admit: 2015-01-22 | Discharge: 2015-02-11 | DRG: 682 | Disposition: A | Discharge status: Skilled Nursing Facility | Payer: Medicare HMO | Source: Ambulatory Visit | Attending: Internal Medicine | Admitting: Internal Medicine

## 2015-01-22 ENCOUNTER — Ambulatory Visit: Payer: Medicare HMO | Admitting: Family Medicine

## 2015-01-22 ENCOUNTER — Inpatient Hospital Stay: Payer: Medicare HMO

## 2015-01-22 DIAGNOSIS — K7460 Unspecified cirrhosis of liver: Secondary | ICD-10-CM | POA: Diagnosis present

## 2015-01-22 DIAGNOSIS — J432 Centrilobular emphysema: Secondary | ICD-10-CM | POA: Diagnosis not present

## 2015-01-22 DIAGNOSIS — J90 Pleural effusion, not elsewhere classified: Secondary | ICD-10-CM | POA: Diagnosis present

## 2015-01-22 DIAGNOSIS — I2510 Atherosclerotic heart disease of native coronary artery without angina pectoris: Secondary | ICD-10-CM | POA: Diagnosis present

## 2015-01-22 DIAGNOSIS — K909 Intestinal malabsorption, unspecified: Secondary | ICD-10-CM | POA: Diagnosis present

## 2015-01-22 DIAGNOSIS — K219 Gastro-esophageal reflux disease without esophagitis: Secondary | ICD-10-CM | POA: Diagnosis present

## 2015-01-22 DIAGNOSIS — R938 Abnormal findings on diagnostic imaging of other specified body structures: Secondary | ICD-10-CM | POA: Diagnosis not present

## 2015-01-22 DIAGNOSIS — N189 Chronic kidney disease, unspecified: Secondary | ICD-10-CM | POA: Diagnosis not present

## 2015-01-22 DIAGNOSIS — Z888 Allergy status to other drugs, medicaments and biological substances status: Secondary | ICD-10-CM

## 2015-01-22 DIAGNOSIS — K5090 Crohn's disease, unspecified, without complications: Secondary | ICD-10-CM | POA: Diagnosis present

## 2015-01-22 DIAGNOSIS — D696 Thrombocytopenia, unspecified: Secondary | ICD-10-CM | POA: Diagnosis not present

## 2015-01-22 DIAGNOSIS — Z955 Presence of coronary angioplasty implant and graft: Secondary | ICD-10-CM | POA: Diagnosis not present

## 2015-01-22 DIAGNOSIS — L03116 Cellulitis of left lower limb: Secondary | ICD-10-CM | POA: Diagnosis present

## 2015-01-22 DIAGNOSIS — Z980 Intestinal bypass and anastomosis status: Secondary | ICD-10-CM | POA: Diagnosis not present

## 2015-01-22 DIAGNOSIS — J449 Chronic obstructive pulmonary disease, unspecified: Secondary | ICD-10-CM | POA: Diagnosis present

## 2015-01-22 DIAGNOSIS — N186 End stage renal disease: Secondary | ICD-10-CM | POA: Diagnosis present

## 2015-01-22 DIAGNOSIS — R195 Other fecal abnormalities: Secondary | ICD-10-CM | POA: Insufficient documentation

## 2015-01-22 DIAGNOSIS — D684 Acquired coagulation factor deficiency: Secondary | ICD-10-CM | POA: Diagnosis present

## 2015-01-22 DIAGNOSIS — R042 Hemoptysis: Secondary | ICD-10-CM | POA: Diagnosis present

## 2015-01-22 DIAGNOSIS — I272 Other secondary pulmonary hypertension: Secondary | ICD-10-CM | POA: Diagnosis present

## 2015-01-22 DIAGNOSIS — Z95828 Presence of other vascular implants and grafts: Secondary | ICD-10-CM

## 2015-01-22 DIAGNOSIS — E8770 Fluid overload, unspecified: Secondary | ICD-10-CM | POA: Diagnosis present

## 2015-01-22 DIAGNOSIS — F419 Anxiety disorder, unspecified: Secondary | ICD-10-CM | POA: Diagnosis present

## 2015-01-22 DIAGNOSIS — I10 Essential (primary) hypertension: Secondary | ICD-10-CM | POA: Diagnosis present

## 2015-01-22 DIAGNOSIS — I1311 Hypertensive heart and chronic kidney disease without heart failure, with stage 5 chronic kidney disease, or end stage renal disease: Secondary | ICD-10-CM | POA: Diagnosis present

## 2015-01-22 DIAGNOSIS — J189 Pneumonia, unspecified organism: Secondary | ICD-10-CM | POA: Diagnosis not present

## 2015-01-22 DIAGNOSIS — N170 Acute kidney failure with tubular necrosis: Secondary | ICD-10-CM | POA: Diagnosis present

## 2015-01-22 DIAGNOSIS — Z66 Do not resuscitate: Secondary | ICD-10-CM | POA: Diagnosis present

## 2015-01-22 DIAGNOSIS — Z79899 Other long term (current) drug therapy: Secondary | ICD-10-CM | POA: Diagnosis not present

## 2015-01-22 DIAGNOSIS — Z90710 Acquired absence of both cervix and uterus: Secondary | ICD-10-CM | POA: Diagnosis not present

## 2015-01-22 DIAGNOSIS — R188 Other ascites: Secondary | ICD-10-CM | POA: Diagnosis present

## 2015-01-22 DIAGNOSIS — R600 Localized edema: Secondary | ICD-10-CM | POA: Diagnosis not present

## 2015-01-22 DIAGNOSIS — N184 Chronic kidney disease, stage 4 (severe): Secondary | ICD-10-CM | POA: Diagnosis not present

## 2015-01-22 DIAGNOSIS — N179 Acute kidney failure, unspecified: Secondary | ICD-10-CM

## 2015-01-22 DIAGNOSIS — D631 Anemia in chronic kidney disease: Secondary | ICD-10-CM | POA: Diagnosis present

## 2015-01-22 DIAGNOSIS — Z881 Allergy status to other antibiotic agents status: Secondary | ICD-10-CM | POA: Diagnosis not present

## 2015-01-22 DIAGNOSIS — J9611 Chronic respiratory failure with hypoxia: Secondary | ICD-10-CM | POA: Diagnosis not present

## 2015-01-22 DIAGNOSIS — E785 Hyperlipidemia, unspecified: Secondary | ICD-10-CM | POA: Diagnosis present

## 2015-01-22 DIAGNOSIS — I509 Heart failure, unspecified: Secondary | ICD-10-CM | POA: Diagnosis not present

## 2015-01-22 DIAGNOSIS — J9610 Chronic respiratory failure, unspecified whether with hypoxia or hypercapnia: Secondary | ICD-10-CM | POA: Diagnosis present

## 2015-01-22 DIAGNOSIS — J984 Other disorders of lung: Secondary | ICD-10-CM | POA: Diagnosis not present

## 2015-01-22 DIAGNOSIS — Z9981 Dependence on supplemental oxygen: Secondary | ICD-10-CM

## 2015-01-22 DIAGNOSIS — F17200 Nicotine dependence, unspecified, uncomplicated: Secondary | ICD-10-CM | POA: Diagnosis present

## 2015-01-22 DIAGNOSIS — R059 Cough, unspecified: Secondary | ICD-10-CM

## 2015-01-22 DIAGNOSIS — R9389 Abnormal findings on diagnostic imaging of other specified body structures: Secondary | ICD-10-CM | POA: Insufficient documentation

## 2015-01-22 DIAGNOSIS — R05 Cough: Secondary | ICD-10-CM

## 2015-01-22 DIAGNOSIS — R0602 Shortness of breath: Secondary | ICD-10-CM

## 2015-01-22 DIAGNOSIS — D509 Iron deficiency anemia, unspecified: Secondary | ICD-10-CM | POA: Diagnosis not present

## 2015-01-22 LAB — COMPREHENSIVE METABOLIC PANEL
ALBUMIN: 2.9 g/dL — AB (ref 3.5–5.0)
ALK PHOS: 143 U/L — AB (ref 38–126)
ALT: 26 U/L (ref 14–54)
AST: 32 U/L (ref 15–41)
Anion gap: 11 (ref 5–15)
BILIRUBIN TOTAL: 1.2 mg/dL (ref 0.3–1.2)
BUN: 71 mg/dL — AB (ref 6–20)
CALCIUM: 8.3 mg/dL — AB (ref 8.9–10.3)
CO2: 14 mmol/L — ABNORMAL LOW (ref 22–32)
CREATININE: 5.44 mg/dL — AB (ref 0.44–1.00)
Chloride: 119 mmol/L — ABNORMAL HIGH (ref 101–111)
GFR calc Af Amer: 8 mL/min — ABNORMAL LOW (ref >60)
GFR calc non Af Amer: 7 mL/min — ABNORMAL LOW (ref >60)
GLUCOSE: 131 mg/dL — AB (ref 65–99)
Potassium: 4.6 mmol/L (ref 3.5–5.1)
Sodium: 144 mmol/L (ref 135–145)
TOTAL PROTEIN: 5.6 g/dL — AB (ref 6.5–8.1)

## 2015-01-22 LAB — CBC
HEMATOCRIT: 23.8 % — AB (ref 35.0–47.0)
HEMOGLOBIN: 7.5 g/dL — AB (ref 12.0–16.0)
MCH: 28.9 pg (ref 26.0–34.0)
MCHC: 31.8 g/dL — ABNORMAL LOW (ref 32.0–36.0)
MCV: 91.1 fL (ref 80.0–100.0)
Platelets: 79 10*3/uL — ABNORMAL LOW (ref 150–440)
RBC: 2.61 MIL/uL — ABNORMAL LOW (ref 3.80–5.20)
RDW: 15.3 % — ABNORMAL HIGH (ref 11.5–14.5)
WBC: 3.8 10*3/uL (ref 3.6–11.0)

## 2015-01-22 LAB — MAGNESIUM: Magnesium: 1.4 mg/dL — ABNORMAL LOW (ref 1.7–2.4)

## 2015-01-22 LAB — PHOSPHORUS: Phosphorus: 8.2 mg/dL — ABNORMAL HIGH (ref 2.5–4.6)

## 2015-01-22 MED ORDER — CEFAZOLIN SODIUM 1-5 GM-% IV SOLN
1.0000 g | Freq: Three times a day (TID) | INTRAVENOUS | Status: DC
  Administered 2015-01-22 – 2015-01-23 (×4): 1 g via INTRAVENOUS
  Filled 2015-01-22 (×5): qty 50

## 2015-01-22 MED ORDER — ISOSORBIDE MONONITRATE ER 30 MG PO TB24
60.0000 mg | ORAL_TABLET | Freq: Every day | ORAL | Status: DC
  Administered 2015-01-22: 60 mg via ORAL
  Filled 2015-01-22: qty 2

## 2015-01-22 MED ORDER — ALBUTEROL SULFATE (2.5 MG/3ML) 0.083% IN NEBU
2.5000 mg | INHALATION_SOLUTION | RESPIRATORY_TRACT | Status: DC | PRN
Start: 2015-01-22 — End: 2015-02-11
  Administered 2015-01-30 – 2015-02-07 (×3): 2.5 mg via RESPIRATORY_TRACT
  Filled 2015-01-22 (×4): qty 3

## 2015-01-22 MED ORDER — ONDANSETRON HCL 4 MG/2ML IJ SOLN
4.0000 mg | Freq: Four times a day (QID) | INTRAMUSCULAR | Status: DC | PRN
  Administered 2015-01-24 – 2015-02-05 (×3): 4 mg via INTRAVENOUS
  Filled 2015-01-22 (×2): qty 2

## 2015-01-22 MED ORDER — ALPRAZOLAM 0.5 MG PO TABS
0.5000 mg | ORAL_TABLET | Freq: Three times a day (TID) | ORAL | Status: DC | PRN
  Administered 2015-01-22 – 2015-02-10 (×33): 0.5 mg via ORAL
  Filled 2015-01-22 (×33): qty 1

## 2015-01-22 MED ORDER — ACETAMINOPHEN 650 MG RE SUPP: 650.0000 mg | Freq: Four times a day (QID) | RECTAL | Status: DC | PRN

## 2015-01-22 MED ORDER — ALBUTEROL SULFATE HFA 108 (90 BASE) MCG/ACT IN AERS: 2.0000 | INHALATION_SPRAY | Freq: Four times a day (QID) | RESPIRATORY_TRACT | Status: DC | PRN

## 2015-01-22 MED ORDER — NITROGLYCERIN 0.4 MG SL SUBL: 0.4000 mg | SUBLINGUAL_TABLET | SUBLINGUAL | Status: DC | PRN

## 2015-01-22 MED ORDER — ACETAMINOPHEN 325 MG PO TABS
650.0000 mg | ORAL_TABLET | Freq: Four times a day (QID) | ORAL | Status: DC | PRN
  Administered 2015-01-23 – 2015-02-07 (×13): 650 mg via ORAL
  Filled 2015-01-22 (×13): qty 2

## 2015-01-22 MED ORDER — ONDANSETRON HCL 4 MG PO TABS
4.0000 mg | ORAL_TABLET | Freq: Four times a day (QID) | ORAL | Status: DC | PRN
  Administered 2015-01-30 – 2015-02-03 (×2): 4 mg via ORAL
  Filled 2015-01-22 (×2): qty 1

## 2015-01-22 MED ORDER — PANTOPRAZOLE SODIUM 40 MG PO TBEC
40.0000 mg | DELAYED_RELEASE_TABLET | Freq: Every day | ORAL | Status: DC
  Administered 2015-01-22 – 2015-02-11 (×18): 40 mg via ORAL
  Filled 2015-01-22 (×18): qty 1

## 2015-01-22 MED ORDER — HEPARIN SODIUM (PORCINE) 5000 UNIT/ML IJ SOLN: 5000.0000 [IU] | Freq: Three times a day (TID) | INTRAMUSCULAR | Status: DC

## 2015-01-22 MED ORDER — ARFORMOTEROL TARTRATE 15 MCG/2ML IN NEBU
15.0000 ug | INHALATION_SOLUTION | Freq: Two times a day (BID) | RESPIRATORY_TRACT | Status: DC
  Administered 2015-01-22 – 2015-02-11 (×40): 15 ug via RESPIRATORY_TRACT
  Filled 2015-01-22 (×45): qty 2

## 2015-01-22 MED ORDER — POLYETHYLENE GLYCOL 3350 17 G PO PACK: 17.0000 g | PACK | Freq: Every day | ORAL | Status: DC | PRN

## 2015-01-22 NOTE — Op Note (Signed)
  OPERATIVE NOTE   PROCEDURE: 1. Ultrasound guidance for vascular access right femoral vein 2. Placement of a 30 cm triple lumen dialysis catheter right femoral vein  PRE-OPERATIVE DIAGNOSIS: 1. Acute renal failure/ESRD   POST-OPERATIVE DIAGNOSIS: Same  SURGEON: Festus Barren, MD  ASSISTANT(S): None  ANESTHESIA: local  ESTIMATED BLOOD LOSS: Minimal   FINDING(S): 1. None  SPECIMEN(S): None  INDICATIONS:  Patient is an 81 yo WF who presents with renal failure and needs a dialysis catheter.  Risks and benefits were discussed, and informed consent was obtained..  DESCRIPTION: After obtaining full informed written consent, the patient was laid flat in the bed. The right groin was sterilely prepped and draped in a sterile surgical field was created. The right femoral vein was visualized with ultrasound and found to be widely patent. It was then accessed under direct guidance without difficulty with a Seldinger needle and a permanent image was recorded. A J-wire was then placed. After skin nick and dilatation, a 30 cm Trialysis dialysis catheter was placed over the wire and the wire was removed. The lumens withdrew dark red nonpulsatile blood and flushed easily with sterile saline. The catheter was secured to the skin with 3 nylon sutures. Sterile dressing was placed.  COMPLICATIONS: None  CONDITION: Stable  DEW,JASON 01/22/2015 5:22 PM

## 2015-01-22 NOTE — H&P (Signed)
[facility] -  at [facility]   PATIENT NAME: Brandi Cline    MR#:  [id number]  DATE OF BIRTH:  Jan 01, 1931  DATE OF ADMISSION:  01/22/2015  PRIMARY CARE PHYSICIAN: Brandi Mannan, MD   REQUESTING/REFERRING PHYSICIAN: Dr Thedore Mins  CHIEF COMPLAINT:  No chief complaint on file.  Shortness of breath, lower extremity edema, left leg pain and redness  HISTORY OF PRESENT ILLNESS:  Brandi Cline  is a 81 y.o. female with a known history of chronic kidney disease stage III who was recently hospitalized from 9/30-10/5 for acute renal failure, also with COPD and chronic respiratory failure on 3-4 L of nasal cannula, coronary artery disease, Crohn's disease, hypertension who presents today from Dr. Doristine Church office due to concern of volume overload and left lower extremity cellulitis. She reports that she has been increasingly short of breath with the sensation of fluid in her lungs. Both legs have continued to swell and the left leg has now become red and very tender. She has had chills, no fever recurs or diaphoresis. No nausea or vomiting but her appetite is decreased. She has chronic diarrhea which has not changed. She has a chronic cough productive of small amounts of sputum this has not changed. She is being admitted for treatment of cellulitis as well as possible temporary hemodialysis to remove fluid.  PAST MEDICAL HISTORY:   Past Medical History  Diagnosis Date  . Dyspnea   . Esophageal reflux   . CAD (coronary artery disease)   . Hypertension   . Crohn's disease   . Allergic rhinitis   . COPD (chronic obstructive pulmonary disease)   . History of kidney stones   . History of shingles   . Trigeminal neuralgia   . Family history of anesthesia complication     " SISTER HAD BAD FEELINGS "  . Anginal pain   . Anxiety     ' JUST FOR MY BREATHING "  . Arthritis     mild in hands  . Hyperlipidemia   . CKD (chronic kidney disease)     PAST SURGICAL HISTORY:    Past Surgical History  Procedure Laterality Date  . Coronary stents    . Lithotripsy    . Ureteral stent placement    . Cholecystectomy    . Total abdominal hysterectomy    . Partial resections large and small bowel for crohn's    . Breast lumps benign    . Left heart catheterization with coronary angiogram N/A 10/28/2011    Procedure: LEFT HEART CATHETERIZATION WITH CORONARY ANGIOGRAM;  Surgeon: Lesleigh Noe, MD;  Location: [facility] CATH LAB;  Service: Cardiovascular;  Laterality: N/A;    SOCIAL HISTORY:   Social History  Substance Use Topics  . Smoking status: Former Smoker -- 1.00 packs/day for 30 years    Types: Cigarettes    Quit date: 04/12/1990  . Smokeless tobacco: Never Used  . Alcohol Use: No    FAMILY HISTORY:   Family History  Problem Relation Age of Onset  . Breast cancer Sister   . COPD Sister   . Stroke Mother   . Prostate cancer Father   . Prostate cancer Brother   . Heart attack Brother     DRUG ALLERGIES:   Allergies  Allergen Reactions  . Avelox [Moxifloxacin Hcl In Nacl] Shortness Of Breath, Swelling and Rash  . Codeine Shortness Of Breath and Other (See Comments)    Couldn't breath  . Theophyllines  Slight increased heart rate  . Ciprofloxacin Other (See Comments)    nervous  . Milk-Related Compounds Other (See Comments)    Whole milk hurts stomach. Not all dairy products do this.  . Tape     Please use paper tape only  . Lisinopril Rash    REVIEW OF SYSTEMS:   Review of Systems  Constitutional: Positive for chills. Negative for fever, weight loss and malaise/fatigue.  HENT: Negative for congestion and hearing loss.   Eyes: Negative for blurred vision and pain.  Respiratory: Positive for cough, sputum production, shortness of breath and wheezing. Negative for hemoptysis and stridor.   Cardiovascular: Positive for chest pain and leg swelling. Negative for palpitations and orthopnea.  Gastrointestinal: Negative for nausea,  vomiting, abdominal pain, diarrhea, constipation and blood in stool.  Genitourinary: Negative for dysuria and frequency.  Musculoskeletal: Negative for myalgias, back pain, joint pain and neck pain.  Skin: Negative for rash.  Neurological: Positive for weakness. Negative for focal weakness, loss of consciousness and headaches.  Endo/Heme/Allergies: Does not bruise/bleed easily.  Psychiatric/Behavioral: Negative for depression and hallucinations. The patient is not nervous/anxious.     MEDICATIONS AT HOME:   Prior to Admission medications   Medication Sig Start Date End Date Taking? Authorizing Provider  albuterol (PROVENTIL HFA;VENTOLIN HFA) 108 (90 BASE) MCG/ACT inhaler Inhale 2 puffs into the lungs every 6 (six) hours as needed for wheezing or shortness of breath. 10/08/14   Waymon Budge, MD  albuterol (PROVENTIL) (2.5 MG/3ML) 0.083% nebulizer solution Take 3 mLs (2.5 mg total) by nebulization every 4 (four) hours as needed. Shortness of breath 10/03/14   Waymon Budge, MD  ALPRAZolam Prudy Feeler) 0.5 MG tablet TAKE ONE TABLET BY MOUTH THREE TIMES DAILY AS NEEDED FOR ANXIETY 01/16/15   Dianne Dun, MD  arformoterol (BROVANA) 15 MCG/2ML NEBU Take 2 mLs (15 mcg total) by nebulization 2 (two) times daily. 08/14/14   Waymon Budge, MD  BIOTIN 5000 PO Take 2 tablets by mouth daily.    Historical Provider, MD  Cholecalciferol (VITAMIN D-3) 5000 UNITS TABS Take 1 tablet by mouth daily.    Historical Provider, MD  isosorbide mononitrate (IMDUR) 30 MG 24 hr tablet TAKE TWO TABLETS BY MOUTH DAILY 08/13/14   Lyn Records, MD  loratadine (CLARITIN) 10 MG tablet Take 10 mg by mouth daily as needed.  07/11/12   Marden Noble, MD  magnesium oxide (MAG-OX) 400 MG tablet Take 400 mg by mouth daily.     Historical Provider, MD  Multiple Vitamin (MULTIVITAMIN) tablet Take 1 tablet by mouth daily.     Historical Provider, MD  nitroGLYCERIN (NITROSTAT) 0.4 MG SL tablet Place 1 tablet (0.4 mg total) under the tongue  every 5 (five) minutes as needed. Chest pain 04/27/13   Lyn Records, MD  omeprazole (PRILOSEC) 20 MG capsule TAKE ONE CAPSULE BY MOUTH DAILY 12/03/14   Dianne Dun, MD  Zinc Acetate, Oral, (ZINC ACETATE PO) Take 1 tablet by mouth daily.    Historical Provider, MD      VITAL SIGNS:  Blood pressure 136/46, pulse 77, temperature 98 F (36.7 C), temperature source Oral, resp. rate 22, SpO2 100 %.  PHYSICAL EXAMINATION:  GENERAL:  81 y.o.-year-old patient lying in the bed with no acute distress. Nasal cannula on EYES: Pupils equal, round, reactive to light and accommodation. No scleral icterus. Extraocular muscles intact.  HEENT: Head atraumatic, normocephalic. Oropharynx and nasopharynx clear. Mucous membranes are moist good dentition NECK:  Supple,  no jugular venous distention. No thyroid enlargement, no tenderness.  LUNGS: Scattered wheezes with good air movement, short shallow respirations, no rhonchi or rales  CARDIOVASCULAR: S1, S2 normal. No murmurs, rubs, or gallops.  ABDOMEN: Soft, nontender, distended. Bowel sounds present. No organomegaly or mass. No guarding no rebound EXTREMITIES: 2+ pedal edema, cyanosis, or clubbing.  NEUROLOGIC: Cranial nerves II through XII are intact. Muscle strength 5/5 in all extremities. Sensation intact. Gait not checked.  PSYCHIATRIC: The patient is alert and oriented x 3.  SKIN: Erythema on the inner left lower leg from the ankle to the mid calf, blistering over both lower extremities with clear blisters, no open lesions, no exudate   LABORATORY PANEL:   CBC No results for input(s): WBC, HGB, HCT, PLT in the last 168 hours. ------------------------------------------------------------------------------------------------------------------  Chemistries  No results for input(s): NA, K, CL, CO2, GLUCOSE, BUN, CREATININE, CALCIUM, MG, AST, ALT, ALKPHOS, BILITOT in the last 168 hours.  Invalid input(s):  GFRCGP ------------------------------------------------------------------------------------------------------------------  Cardiac Enzymes No results for input(s): TROPONINI in the last 168 hours. ------------------------------------------------------------------------------------------------------------------  RADIOLOGY:  No results found.  EKG:   Orders placed or performed during the hospital encounter of 09/19/13  . EKG 12-Lead  . EKG 12-Lead  . EKG    IMPRESSION AND PLAN:   #1 acute renal failure on CKD3: Per nephrology clinic report her creatinine has not improved since her discharge on 10/5. We will recheck again today. She seems to be volume overloaded at this time with edema and shortness of breath. We'll get chest x-ray. Will monitor I's and O's and daily weights. Consult nephrology as well as vascular surgery for possible temporary hemodialysis access and treatments during this hospitalization. Will need to discuss the possibility of long-term dialysis.  #2 left lower extremity cellulitis: Will check a CBC to assess for leukocytosis, check blood cultures. Elevate the leg. Attempt hemodialysis to remove fluid. Start Ancef.  #3 chronic respiratory failure on 3 L nasal cannula due to COPD due to smoking: Oxygenation is stable at this time. We'll check chest x-ray to assess for possible pulmonary edema as she seems to feel short of breath. Continue home regimen of brovana and Spiriva  #4 anemia: Last hemoglobin recorded at 7.5. Likely anemia of chronic disease. Will need to monitor carefully transfuse if less than 7.  #5 GERD: protonix  #6 anxiety: continue xanax as she takes this at home.  CODE STATUS: DNR discussed with patient and daughter and sister at bedside on admission.  TOTAL TIME TAKING CARE OF THIS PATIENT: 45 minutes.  Greater than 50% of time spent in coordination of care and counseling.  Elby Showers M.D on 01/22/2015 at 1:20 PM  Between 7am to 6pm -  Pager - [phone]  After 6pm go to [email or web address] - password [email or web address] [facility]  [facility]  Office  [phone]  CC: Primary care physician; Brandi Mannan, MD

## 2015-01-22 NOTE — Progress Notes (Signed)
Primary nurse contacted Dr. Clent Ridges in concern to order for Heparin injection. Orders were received for primary nurse to D/C heparin

## 2015-01-22 NOTE — Consult Note (Signed)
[facility] Vascular Consult Note  MRN : [id number]  Brandi Cline is a 81 y.o. (03-14-1931) female who presents with chief complaint of No chief complaint on file. [name]  History of Present Illness: Patient is admitted to the hospital with renal failure.  She has known CKD and it is not clear if this is ESRD or ARF.  She has lethargy and decreased appetite.  Breathing OK and stable hemodynamically.  The nephrology service has decided to initiate dialysis at this time, and we are asked to place a temporary dialysis catheter for immediate dialysis use.  She is aware and understanding of the situation, and has no other complaints  Current Facility-Administered Medications  Medication Dose Route Frequency Provider Last Rate Last Dose  . acetaminophen (TYLENOL) tablet 650 mg  650 mg Oral Q6H PRN Gale Journey, MD       Or  . acetaminophen (TYLENOL) suppository 650 mg  650 mg Rectal Q6H PRN Gale Journey, MD      . ceFAZolin (ANCEF) IVPB 1 g/50 mL premix  1 g Intravenous 3 times per day Gale Journey, MD   1 g at 01/22/15 1502  . ondansetron (ZOFRAN) tablet 4 mg  4 mg Oral Q6H PRN Gale Journey, MD       Or  . ondansetron [facility]) injection 4 mg  4 mg Intravenous Q6H PRN Gale Journey, MD      . polyethylene glycol (MIRALAX / GLYCOLAX) packet 17 g  17 g Oral Daily PRN Gale Journey, MD        Past Medical History  Diagnosis Date  . Dyspnea   . Esophageal reflux   . CAD (coronary artery disease)   . Hypertension   . Crohn's disease (HCC)   . Allergic rhinitis   . COPD (chronic obstructive pulmonary disease) (HCC)   . History of kidney stones   . History of shingles   . Trigeminal neuralgia   . Family history of anesthesia complication     " SISTER HAD BAD FEELINGS "  . Anginal pain (HCC)   . Anxiety     ' JUST FOR MY BREATHING "  . Arthritis     mild in hands  . Hyperlipidemia   . CKD (chronic kidney disease)     Past Surgical  History  Procedure Laterality Date  . Coronary stents    . Lithotripsy    . Ureteral stent placement    . Cholecystectomy    . Total abdominal hysterectomy    . Partial resections large and small bowel for crohn's    . Breast lumps benign    . Left heart catheterization with coronary angiogram N/A 10/28/2011    Procedure: LEFT HEART CATHETERIZATION WITH CORONARY ANGIOGRAM;  Surgeon: Lesleigh Noe, MD;  Location: [facility] CATH LAB;  Service: Cardiovascular;  Laterality: N/A;    Social History Social History  Substance Use Topics  . Smoking status: Former Smoker -- 1.00 packs/day for 30 years    Types: Cigarettes    Quit date: 04/12/1990  . Smokeless tobacco: Never Used  . Alcohol Use: No  No IVDU  Family History Family History  Problem Relation Age of Onset  . Breast cancer Sister   . COPD Sister   . Stroke Mother   . Prostate cancer Father   . Prostate cancer Brother   . Heart attack Brother     Allergies  Allergen Reactions  . Avelox [Moxifloxacin Hcl  In Nacl] Shortness Of Breath, Swelling and Rash  . Codeine Shortness Of Breath and Other (See Comments)    Couldn't breath  . Theophyllines     Slight increased heart rate  . Ciprofloxacin Other (See Comments)    nervous  . Milk-Related Compounds Other (See Comments)    Whole milk hurts stomach. Not all dairy products do this.  . Tape     Please use paper tape only  . Lisinopril Rash     REVIEW OF SYSTEMS (Negative unless checked)  Constitutional: Weight loss  Fever  Chills Cardiac: Chest pain   Chest pressure   Palpitations   Shortness of breath when laying flat   Shortness of breath at rest   Shortness of breath with exertion. Vascular:  Pain in legs with walking   Pain in legs at rest   Pain in legs when laying flat   Claudication   Pain in feet when walking  Pain in feet at rest  Pain in feet when laying flat   History of DVT   Phlebitis   Swelling in legs   Varicose veins    Non-healing ulcers Pulmonary:   Uses home oxygen   Productive cough   Hemoptysis   Wheeze  COPD   Asthma Neurologic:  Dizziness  Blackouts   Seizures   History of stroke   History of TIA  Aphasia   Temporary blindness   Dysphagia   Weakness or numbness in arms   Weakness or numbness in legs Musculoskeletal:  Arthritis   Joint swelling   Joint pain   Low back pain Hematologic:  Easy bruising  Easy bleeding   Hypercoagulable state   Anemic  Hepatitis Gastrointestinal:  Blood in stool   Vomiting blood  Gastroesophageal reflux/heartburn   Difficulty swallowing. Genitourinary:  Chronic kidney disease   Difficult urination  Frequent urination  Burning with urination   Blood in urine Skin:  Rashes   Ulcers   Wounds Psychological:  History of anxiety    History of major depression.      Physical Examination  Filed Vitals:   01/22/15 1232 01/22/15 1427  BP: 136/46   Pulse: 77   Temp: 98 F (36.7 C)   TempSrc: Oral   Resp: 22   Height:   (1.626 m)  Weight:  67.042 kg (147 lb 12.8 oz)  SpO2: 100%    Body mass index is 25.36 kg/(m^2). Gen: WD/WN Head: [place]/AT, No temporalis wasting Ear/Nose/Throat: Hearing grossly intact, nares w/o erythema or drainage,  Eyes: PERRLA, EOMI.  Neck: Supple, no nuchal rigidity.  No JVD.  Pulmonary:  Good air movement, no use of accessory muscles Cardiac: RRR, normal S1, S2,   Gastrointestinal: soft, non-tender/non-distended. No guarding/reflex.  Musculoskeletal: M/S 5/5 throughout.  Extremities without ischemic changes.  No deformity or atrophy. Mild Edema in the lower extremities bilaterally Neurologic: normal strength and tone, no focal deficits appreciated Psychiatric: normal affect and mood Dermatologic: No rashes or ulcers noted.   Lymph : No Cervical, Axillary, or Inguinal lymphadenopathy.  CBC Lab Results  Component Value Date   WBC 3.8 01/22/2015   HGB  7.5* 01/22/2015   HCT 23.8* 01/22/2015   MCV 91.1 01/22/2015   PLT 79* 01/22/2015    BMET    Component Value Date/Time   NA 144 01/22/2015 1339   NA 142 12/04/2014 1120   K 4.6 01/22/2015 1339   K 5.2* 12/04/2014 1120   CL 119* 01/22/2015 1339   CO2 14* 01/22/2015 1339  CO2 19* 12/04/2014 1120   GLUCOSE 131* 01/22/2015 1339   GLUCOSE 120 12/04/2014 1120   BUN 71* 01/22/2015 1339   BUN 42.8* 12/04/2014 1120   CREATININE 5.44* 01/22/2015 1339   CREATININE 2.6* 12/04/2014 1120   CALCIUM 8.3* 01/22/2015 1339   CALCIUM 8.5 12/04/2014 1120   GFRNONAA 7* 01/22/2015 1339   GFRAA 8* 01/22/2015 1339   Estimated Creatinine Clearance: 7.6 mL/min (by C-G formula based on Cr of 5.44).  COAG Lab Results  Component Value Date   INR 1.30 09/23/2013   INR 1.63* 09/21/2013   INR 1.13 04/14/2009    Radiology US Renal  01/11/2015  CLINICAL DATA:  Acute on chronic renal failure. EXAM: RENAL / URINARY TRACT ULTRASOUND COMPLETE COMPARISON:  10/22/2014 CT FINDINGS: Right Kidney: Length: 11.6 cm. Diffusely increased echogenicity noted. There is no evidence of hydronephrosis or solid mass. Left Kidney: Length: 12.1 cm. Diffusely increased echogenicity noted. There is no evidence of hydronephrosis or solid mass. A 4 mm echogenic focus within the mid-lower left kidney may represent a nonobstructing calculus. Bladder: Appears normal for degree of bladder distention. Incidentally noted is cirrhosis and small amount of ascites. IMPRESSION: Increased renal echogenicity compatible with medical renal disease. Normal sized kidneys. No evidence of hydronephrosis. Possible nonobstructing 4 mm mid-lower left renal calculus. Cirrhosis and small amount of ascites. Electronically Signed   By: Harmon Pier M.D.   On: 01/11/2015 11:26   Dg Chest Port 1 View  01/22/2015  CLINICAL DATA:  Increasing shortness of Breath, history of COPD EXAM: PORTABLE CHEST - 1 VIEW COMPARISON:  04/10/2014 FINDINGS: Cardiac shadow is  again mildly enlarged. Diffuse interstitial changes are again noted. No focal infiltrate or sizable effusion is seen. No bony abnormality is noted. IMPRESSION: Chronic changes without acute abnormality. Electronically Signed   By: Alcide Clever M.D.   On: 01/22/2015 14:22      Assessment/Plan 1. ARF versus ESRD.  Needs to start HD and temp cath will be placed today 2. CAD. Stable.   3. HTN. Stable. On outpatient meds   We will proceed with temporary dialysis catheter placement at this time.  Risks and benefits discussed with patient and/or family, and the catheter will be placed to allow immediate initiation of dialysis.  If the patient's renal function does not improve throughout the hospital course, we will be happy to place a tunneled dialysis catheter for long term use prior to discharge.     Donaciano Range, MD  01/22/2015 4:37 PM

## 2015-01-23 ENCOUNTER — Inpatient Hospital Stay: Payer: Medicare HMO

## 2015-01-23 ENCOUNTER — Telehealth: Payer: Self-pay | Admitting: *Deleted

## 2015-01-23 LAB — URINALYSIS COMPLETE WITH MICROSCOPIC (ARMC ONLY)
BILIRUBIN URINE: NEGATIVE
Bacteria, UA: NONE SEEN
Glucose, UA: NEGATIVE mg/dL
KETONES UR: NEGATIVE mg/dL
Nitrite: NEGATIVE
PH: 5 (ref 5.0–8.0)
PROTEIN: NEGATIVE mg/dL
Specific Gravity, Urine: 1.013 (ref 1.005–1.030)

## 2015-01-23 LAB — PROTEIN / CREATININE RATIO, URINE
CREATININE, URINE: 123 mg/dL
PROTEIN CREATININE RATIO: 0.22 mg/mg{creat} — AB (ref 0.00–0.15)
Total Protein, Urine: 27 mg/dL

## 2015-01-23 LAB — C DIFFICILE QUICK SCREEN W PCR REFLEX
C DIFFICILE (CDIFF) INTERP: NEGATIVE
C Diff antigen: NEGATIVE
C Diff toxin: NEGATIVE

## 2015-01-23 LAB — PHOSPHORUS: Phosphorus: 7.4 mg/dL — ABNORMAL HIGH (ref 2.5–4.6)

## 2015-01-23 MED ORDER — LIDOCAINE-PRILOCAINE 2.5-2.5 % EX CREA: 1.0000 "application " | TOPICAL_CREAM | CUTANEOUS | Status: DC | PRN

## 2015-01-23 MED ORDER — SODIUM CHLORIDE 0.9 % IV SOLN: 100.0000 mL | INTRAVENOUS | Status: DC | PRN

## 2015-01-23 MED ORDER — PENTAFLUOROPROP-TETRAFLUOROETH EX AERO: 1.0000 "application " | INHALATION_SPRAY | CUTANEOUS | Status: DC | PRN

## 2015-01-23 MED ORDER — MAGNESIUM SULFATE 2 GM/50ML IV SOLN
2.0000 g | Freq: Once | INTRAVENOUS | Status: AC
  Administered 2015-01-23: 2 g via INTRAVENOUS
  Filled 2015-01-23: qty 50

## 2015-01-23 MED ORDER — ALTEPLASE 2 MG IJ SOLR: 2.0000 mg | Freq: Once | INTRAMUSCULAR | Status: DC | PRN

## 2015-01-23 MED ORDER — LIDOCAINE HCL (PF) 1 % IJ SOLN: 5.0000 mL | INTRAMUSCULAR | Status: DC | PRN

## 2015-01-23 MED ORDER — CEFAZOLIN SODIUM 1-5 GM-% IV SOLN
1.0000 g | INTRAVENOUS | Status: DC
  Filled 2015-01-23: qty 50

## 2015-01-23 MED ORDER — HEPARIN SODIUM (PORCINE) 1000 UNIT/ML DIALYSIS: 1000.0000 [IU] | INTRAMUSCULAR | Status: DC | PRN

## 2015-01-23 MED ORDER — ENSURE ENLIVE PO LIQD
237.0000 mL | ORAL | Status: DC
  Administered 2015-01-23 – 2015-02-11 (×16): 237 mL via ORAL

## 2015-01-23 NOTE — Progress Notes (Signed)
Pre-hd tx 

## 2015-01-23 NOTE — Telephone Encounter (Signed)
Ok to give verbal order for OT as requested 

## 2015-01-23 NOTE — Progress Notes (Signed)
HD tx start 

## 2015-01-23 NOTE — Progress Notes (Signed)
[facility]  ROUNDING NOTE   Subjective:  Pt well known to Brandi Cline. Sent by Dr. Candiss Norse for acute renal failure requiring dialysis. Previous serologic work up was negative. Was exposed to bactrim in May of this past year.  Had first HD treatment today. No renal biopsy performed.    Objective:  Vital signs in last 24 hours:  Temp:  [97.7 F (36.5 C)-98 F (36.7 C)] 97.8 F (36.6 C) (10/13 1145) Pulse Rate:  [67-78] 78 (10/13 1130) Resp:  [15-26] 26 (10/13 1130) BP: (106-135)/(34-84) 135/38 mmHg (10/13 1227) SpO2:  [93 %-100 %] 97 % (10/13 1130) Weight:  [71.4 kg (157 lb 6.5 oz)-71.487 kg (157 lb 9.6 oz)] 71.4 kg (157 lb 6.5 oz) (10/13 1145)  Weight change:  Filed Weights   01/23/15 0542 01/23/15 0945 01/23/15 1145  Weight: 71.487 kg (157 lb 9.6 oz) 71.4 kg (157 lb 6.5 oz) 71.4 kg (157 lb 6.5 oz)    Intake/Output: I/O last 3 completed shifts: In: 30 [IV Piggyback:71] Out: 17 [Urine:650]   Intake/Output this shift:     Physical Exam: General: NAD  Head: Normocephalic, atraumatic. Moist oral mucosal membranes  Eyes: Anicteric, PERRL  Neck: Supple, trachea midline  Lungs:  Clear to auscultation normal effort  Heart: Regular rate and rhythm  Abdomen:  Soft, nontender, BS present  Extremities:  trace peripheral edema.  Neurologic: Nonfocal, moving all four extremities  Skin: No lesions  Access: Right femoral temp HD catheter.    Basic Metabolic Panel:  Recent Labs Lab 01/22/15 1339 01/23/15 1040  NA 144  --   K 4.6  --   CL 119*  --   CO2 14*  --   GLUCOSE 131*  --   BUN 71*  --   CREATININE 5.44*  --   CALCIUM 8.3*  --   MG 1.4*  --   PHOS 8.2* 7.4*    Liver Function Tests:  Recent Labs Lab 01/22/15 1339  AST 32  ALT 26  ALKPHOS 143*  BILITOT 1.2  PROT 5.6*  ALBUMIN 2.9*   No results for input(s): LIPASE, AMYLASE in the last 168 hours. No results for input(s): AMMONIA in the last 168 hours.  CBC:  Recent Labs Lab 01/22/15 1339   WBC 3.8  HGB 7.5*  HCT 23.8*  MCV 91.1  PLT 79*    Cardiac Enzymes: No results for input(s): CKTOTAL, CKMB, CKMBINDEX, TROPONINI in the last 168 hours.  BNP: Invalid input(s): POCBNP  CBG: No results for input(s): GLUCAP in the last 168 hours.  Microbiology: Results for orders placed or performed in visit on 09/07/14  Urine culture     Status: None   Collection Time: 09/07/14 12:30 PM  Result Value Ref Range Status   Colony Count NO GROWTH  Final   Organism ID, Bacteria NO GROWTH  Final    Coagulation Studies: No results for input(s): LABPROT, INR in the last 72 hours.  Urinalysis: No results for input(s): COLORURINE, LABSPEC, PHURINE, GLUCOSEU, HGBUR, BILIRUBINUR, KETONESUR, PROTEINUR, UROBILINOGEN, NITRITE, LEUKOCYTESUR in the last 72 hours.  Invalid input(s): APPERANCEUR    Imaging: Dg Chest Port 1 View  01/22/2015  CLINICAL DATA:  Increasing shortness of Breath, history of COPD EXAM: PORTABLE CHEST - 1 VIEW COMPARISON:  04/10/2014 FINDINGS: Cardiac shadow is again mildly enlarged. Diffuse interstitial changes are again noted. No focal infiltrate or sizable effusion is seen. No bony abnormality is noted. IMPRESSION: Chronic changes without acute abnormality. Electronically Signed   By: Inez Catalina  M.D.   On: 01/22/2015 14:22     Medications:     . arformoterol  15 mcg Nebulization BID  .  ceFAZolin (ANCEF) IV  1 g Intravenous 3 times per day  . feeding supplement (ENSURE ENLIVE)  237 mL Oral Q24H  . isosorbide mononitrate  60 mg Oral Daily  . magnesium sulfate 1 - 4 g bolus IVPB  2 g Intravenous Once  . pantoprazole  40 mg Oral Daily   sodium chloride, sodium chloride, acetaminophen **OR** acetaminophen, albuterol, ALPRAZolam, alteplase, heparin, lidocaine (PF), lidocaine-prilocaine, nitroGLYCERIN, ondansetron **OR** ondansetron (ZOFRAN) IV, pentafluoroprop-tetrafluoroeth, polyethylene glycol  Assessment/ Plan:  81 y.o. female Coronary disease- with  coronary stents, Hypertension, Crohn's disease, COPD, uses home oxygen around the clock, Kidney stones- lithotripsy, ureteral stents, History of shingles, Arthritis, Hyperlipidemia, Partial resection of bowel for Crohn's disease.   1.  Acute renal failure of undetermined etiology/CKD stage III.  The patient has had progressively worsening renal function since July of 2016.  EGFR was down to 6 as outpt most recently. Exact etiology of kidney disease unclear, had Bactrim in May. - Pt was dialyzed today, will plan for HD again tomorrow.  Will likely need to consider renal biopsy to determine etiology.  Will start on IVF hydration to treat any potential underlying volume depletion.  Will check full serologic work up again and will also check renal Brandi Cline.  2.  Anemia of CKD:  hgb 7.5, will consider starting patient on epogen if hgb remains low.  3.  SHPTH of renal origin:  Phos quite high at 7.4, should come down with dialysis.        LOS: 1 Brandi Cline 10/13/20164:03 PM

## 2015-01-23 NOTE — Progress Notes (Signed)
This note also relates to the following rows which could not be included: Pulse Rate - Cannot attach notes to unvalidated device data Resp - Cannot attach notes to unvalidated device data SpO2 - Cannot attach notes to unvalidated device data   Post hd tx 

## 2015-01-23 NOTE — Progress Notes (Signed)
[facility] - [place] at [facility]   PATIENT NAME: Brandi Cline    MR#:  [id number]  DATE OF BIRTH:  [birth year]  SUBJECTIVE:  CHIEF COMPLAINT:  Patient just came back from first episode of dialysis today. Denies any shortness of breath or chest pain. Resting comfortably  REVIEW OF SYSTEMS:  CONSTITUTIONAL: No fever, fatigue or weakness.  EYES: No blurred or double vision.  EARS, NOSE, AND THROAT: No tinnitus or ear pain.  RESPIRATORY: No cough, shortness of breath, wheezing or hemoptysis.  CARDIOVASCULAR: No chest pain, orthopnea, edema.  GASTROINTESTINAL: No nausea, vomiting, diarrhea or abdominal pain.  GENITOURINARY: No dysuria, hematuria.  ENDOCRINE: No polyuria, nocturia,  HEMATOLOGY: No anemia, easy bruising or bleeding SKIN: No rash or lesion. Left leg with redness and pain MUSCULOSKELETAL: No joint pain or arthritis.   NEUROLOGIC: No tingling, numbness, weakness.  PSYCHIATRY: No anxiety or depression.   DRUG ALLERGIES:   Allergies  Allergen Reactions  . Avelox [Moxifloxacin Hcl In Nacl] Shortness Of Breath, Swelling and Rash  . Codeine Shortness Of Breath and Other (See Comments)    Couldn't breath  . Theophyllines     Slight increased heart rate  . Ciprofloxacin Other (See Comments)    nervous  . Milk-Related Compounds Other (See Comments)    Whole milk hurts stomach. Not all dairy products do this.  . Tape     Please use paper tape only  . Lisinopril Rash    VITALS:  Blood pressure 135/38, pulse 78, temperature 97.8 F (36.6 C), temperature source Oral, resp. rate 26, height  (1.626 m), weight 71.4 kg (157 lb 6.5 oz), SpO2 97 %.  PHYSICAL EXAMINATION:  GENERAL:  81 y.o.-year-old patient lying in the bed with no acute distress.  EYES: Pupils equal, round, reactive to light and accommodation. No scleral icterus. Extraocular muscles intact.  HEENT: Head atraumatic, normocephalic. Oropharynx and nasopharynx clear.  NECK:  Supple, no  jugular venous distention. No thyroid enlargement, no tenderness.  LUNGS: Normal breath sounds bilaterally, no wheezing, rales,rhonchi or crepitation. No use of accessory muscles of respiration.  CARDIOVASCULAR: S1, S2 normal. No murmurs, rubs, or gallops.  ABDOMEN: Soft, nontender, nondistended. Bowel sounds present. No organomegaly or mass.  EXTREMITIES: No pedal edema, cyanosis, or clubbing.  NEUROLOGIC: Cranial nerves II through XII are intact. Muscle strength 5/5 in all extremities. Sensation intact. Gait not checked.  PSYCHIATRIC: The patient is alert and oriented x 3.  SKIN: No obvious rash, lesion, or ulcer. Left lower extremity with decreased erythema and tenderness still edematous   LABORATORY PANEL:   CBC  Recent Labs Lab 01/22/15 1339  WBC 3.8  HGB 7.5*  HCT 23.8*  PLT 79*   ------------------------------------------------------------------------------------------------------------------  Chemistries   Recent Labs Lab 01/22/15 1339  NA 144  K 4.6  CL 119*  CO2 14*  GLUCOSE 131*  BUN 71*  CREATININE 5.44*  CALCIUM 8.3*  MG 1.4*  AST 32  ALT 26  ALKPHOS 143*  BILITOT 1.2   ------------------------------------------------------------------------------------------------------------------  Cardiac Enzymes No results for input(s): TROPONINI in the last 168 hours. ------------------------------------------------------------------------------------------------------------------  RADIOLOGY:  Dg Chest Port 1 View  01/22/2015  CLINICAL DATA:  Increasing shortness of Breath, history of COPD EXAM: PORTABLE CHEST - 1 VIEW COMPARISON:  04/10/2014 FINDINGS: Cardiac shadow is again mildly enlarged. Diffuse interstitial changes are again noted. No focal infiltrate or sizable effusion is seen. No bony abnormality is noted. IMPRESSION: Chronic changes without acute abnormality. Electronically Signed   By: Loraine Leriche  Lukens M.D.   On: 01/22/2015 14:22    EKG:   Orders  placed or performed during the hospital encounter of 09/19/13  . EKG 12-Lead  . EKG 12-Lead  . EKG    ASSESSMENT AND PLAN:   #1 acute renal failure on CKD3 progress to ESRD -  With volume overload with edema and shortness of breath at the time of admission  Per nephrology clinic report her creatinine has not improved since her discharge on 10/5.  Had a temporary dialysis catheter placed in the right femoral vein by vascular surgery 10/12 Patient had a first-time hemodialysis today. We'll follow up with nephrology  Will monitor I's and O's and daily weights.  Will need to discuss the possibility of long-term dialysis.  #2 left lower extremity cellulitis:  Follow-up blood cultures.  Elevate the leg.   Continue Ancef.  #3 chronic respiratory failure on 3 L nasal cannula due to COPD due to smoking: Oxygenation is stable at this time.   chest x-ray with no acute abnormalities. Continue home regimen of brovana and Spiriva  #4 anemia: Last hemoglobin recorded at 7.5. Likely anemia of chronic disease. Will need to monitor carefully transfuse if less than 7.  #5 GERD: protonix  #6 anxiety: continue xanax as she takes this at home.  CODE STATUS: DNR discussed with patient and daughter and sister at bedside on admission.     All the records are reviewed and case discussed with Care Management/Social Workerr. Management plans discussed with the patient, family and they are in agreement.    TOTAL TIME TAKING CARE OF THIS PATIENT: 35 minutes.   POSSIBLE D/C IN ? DAYS, DEPENDING ON CLINICAL CONDITION.   Ramonita Lab M.D on 01/23/2015 at 2:39 PM  Between 7am to 6pm - Pager - [phone] After 6pm go to [email or web address] - password [email or web address] [facility]  [facility]  Office  [phone]  CC: Primary care physician; [name], MD

## 2015-01-23 NOTE — Progress Notes (Signed)
Initial Nutrition Assessment   INTERVENTION:   Meals and Snacks: Cater to patient preferences. Pt and pt son report lactose intolerance, and usually drinks Lactaid milk. Some milk products bother her but not all. Medical Food Supplement Therapy: will recommend Ensure Enlive po daily, each supplement provides 350 kcal and 20 grams of protein as pt likes. Pt confirmed that avoiding lactose, not milk allergy, wants to try ensure again. Will monitor electrolytes and switch to Nepro as needed.   NUTRITION DIAGNOSIS:   Inadequate oral intake related to acute illness as evidenced by per patient/family report.  GOAL:   Patient will meet greater than or equal to 90% of their needs  MONITOR:    (Energy Intake, Electrolyte and renal Profile, anthropometrics, Digestive System)  REASON FOR ASSESSMENT:   Diagnosis    ASSESSMENT:   Pt admitted with acute on chronic renal failure with lower extremity cellulitis. Pt s/p temp cath placed and HD yesterday.  Past Medical History  Diagnosis Date  . Dyspnea   . Esophageal reflux   . CAD (coronary artery disease)   . Hypertension   . Crohn's disease (HCC)   . Allergic rhinitis   . COPD (chronic obstructive pulmonary disease) (HCC)   . History of kidney stones   . History of shingles   . Trigeminal neuralgia   . Family history of anesthesia complication     " SISTER HAD BAD FEELINGS "  . Anginal pain (HCC)   . Anxiety     ' JUST FOR MY BREATHING "  . Arthritis     mild in hands  . Hyperlipidemia   . CKD (chronic kidney disease)      Diet Order:  Diet renal with fluid restriction Fluid restriction:: 1200 mL Fluid; Room service appropriate?: Yes; Fluid consistency:: Thin    Current Nutrition: Pt reports eating 'a little bit' for lunch but was not too crazy about the meal.   Food/Nutrition-Related History: Pt reports appetite normally has been good PTA. Pt admits to eating small meals/snacks throughout the day. Pt has had Ensure in  the past.   Medications: MgS, Protonix  Electrolyte/Renal Profile and Glucose Profile:   Recent Labs Lab 01/22/15 1339 01/23/15 1040  NA 144  --   K 4.6  --   CL 119*  --   CO2 14*  --   BUN 71*  --   CREATININE 5.44*  --   CALCIUM 8.3*  --   MG 1.4*  --   PHOS 8.2* 7.4*  GLUCOSE 131*  --    Protein Profile:  Recent Labs Lab 01/22/15 1339  ALBUMIN 2.9*    Gastrointestinal Profile: Last BM:  01/23/2015, c.diff pending UOP: 0mL UF yesterday with dialysis   Nutrition-Focused Physical Exam Findings: Nutrition-Focused physical exam completed. Findings are no fat depletion, no muscle depletion, and 3+ edema of BLLE.    Weight Change: Pt reports usual dry weight of 40-45kg; question accruacy. RD notes current weight much higher. Per CH[facility] pt with weight gain.    Height:   Ht Readings from Last 1 Encounters:  01/22/15  (1.626 m)    Weight:   Wt Readings from Last 1 Encounters:  01/23/15 157 lb 6.5 oz (71.4 kg)    Wt Readings from Last 10 Encounters:  01/23/15 157 lb 6.5 oz (71.4 kg)  01/10/15 138 lb 8 oz (62.823 kg)  01/06/15 135 lb 12.8 oz (61.598 kg)  01/01/15 138 lb 14.4 oz (63.005 kg)  12/04/14 144 lb 9.6 oz (  65.59 kg)  10/21/14 149 lb 4 oz (67.699 kg)  10/07/14 146 lb 8 oz (66.452 kg)  10/04/14 141 lb 12 oz (64.297 kg)  10/03/14 148 lb (67.132 kg)  09/07/14 147 lb (66.679 kg)    BMI:  Body mass index is 27.01 kg/(m^2).  Estimated Nutritional Needs:   Kcal:  BEE: 1165kcals, TEE: (IF 1.1-1.3)(AF 1.2) 1538-1817kcals  Protein:  79-93g protein (1.1-1.3g/kg)  Fluid:  UOP+1L  EDUCATION NEEDS:   Education needs no appropriate at this time. If pt to remain on HD, may benefit from education   MODERATE Care Level  Leda Quail, [place], [place] Pager [phone]

## 2015-01-23 NOTE — Progress Notes (Signed)
Pt BP low. Dr. Elpidio Anis paged. Awaiting callback. Primary RN to continue to monitor.

## 2015-01-23 NOTE — Progress Notes (Signed)
Post hd tx 

## 2015-01-23 NOTE — Progress Notes (Signed)
Dr. Amado Coe paged in regard to Pt low BP and pt having three loose stools. Primary RN awaiting callback.

## 2015-01-23 NOTE — Telephone Encounter (Signed)
OT from [facility] calling for verbal order for one session.

## 2015-01-23 NOTE — Progress Notes (Signed)
Dr. Jonathon Resides notified of Pt low BP. No new orders.  Primary nurse to continue to monitor pt . Pt has had three loose/ Liquid stool this AM. Orders received for pt to be placed on contact precautions/ and get stool sample for Cdiff

## 2015-01-23 NOTE — Progress Notes (Signed)
HD tx completed.

## 2015-01-24 LAB — BASIC METABOLIC PANEL
ANION GAP: 10 (ref 5–15)
BUN: 52 mg/dL — AB (ref 6–20)
CO2: 18 mmol/L — ABNORMAL LOW (ref 22–32)
Calcium: 8.1 mg/dL — ABNORMAL LOW (ref 8.9–10.3)
Chloride: 116 mmol/L — ABNORMAL HIGH (ref 101–111)
Creatinine, Ser: 4.52 mg/dL — ABNORMAL HIGH (ref 0.44–1.00)
GFR, EST AFRICAN AMERICAN: 10 mL/min — AB (ref >60)
GFR, EST NON AFRICAN AMERICAN: 8 mL/min — AB (ref >60)
Glucose, Bld: 87 mg/dL (ref 65–99)
POTASSIUM: 3.4 mmol/L — AB (ref 3.5–5.1)
SODIUM: 144 mmol/L (ref 135–145)

## 2015-01-24 LAB — HEPATITIS B SURFACE ANTIGEN
HEP B S AG: NEGATIVE
Hepatitis B Surface Ag: NEGATIVE

## 2015-01-24 LAB — MAGNESIUM: MAGNESIUM: 1.8 mg/dL (ref 1.7–2.4)

## 2015-01-24 LAB — HEPATITIS B SURFACE ANTIBODY,QUALITATIVE: Hep B S Ab: REACTIVE

## 2015-01-24 LAB — CBC
HCT: 21.8 % — ABNORMAL LOW (ref 35.0–47.0)
Hemoglobin: 7 g/dL — ABNORMAL LOW (ref 12.0–16.0)
MCH: 29.1 pg (ref 26.0–34.0)
MCHC: 32.3 g/dL (ref 32.0–36.0)
MCV: 90.3 fL (ref 80.0–100.0)
PLATELETS: 59 10*3/uL — AB (ref 150–440)
RBC: 2.41 MIL/uL — AB (ref 3.80–5.20)
RDW: 15.3 % — ABNORMAL HIGH (ref 11.5–14.5)
WBC: 3.4 10*3/uL — AB (ref 3.6–11.0)

## 2015-01-24 LAB — HEPATITIS B CORE ANTIBODY, TOTAL: HEP B C TOTAL AB: NEGATIVE

## 2015-01-24 LAB — PARATHYROID HORMONE, INTACT (NO CA): PTH: 196 pg/mL — AB (ref 15–65)

## 2015-01-24 MED ORDER — SODIUM CHLORIDE 0.9 % IV SOLN
INTRAVENOUS | Status: DC
  Administered 2015-01-24 – 2015-01-26 (×3): via INTRAVENOUS

## 2015-01-24 MED ORDER — SODIUM CHLORIDE 0.9 % IV SOLN
1250.0000 mg | Freq: Once | INTRAVENOUS | Status: AC
  Administered 2015-01-24: 1250 mg via INTRAVENOUS
  Filled 2015-01-24: qty 1250

## 2015-01-24 MED ORDER — LOPERAMIDE HCL 2 MG PO CAPS
2.0000 mg | ORAL_CAPSULE | Freq: Once | ORAL | Status: AC
  Administered 2015-01-24: 2 mg via ORAL
  Filled 2015-01-24: qty 1

## 2015-01-24 MED ORDER — GUAIFENESIN-DM 100-10 MG/5ML PO SYRP
10.0000 mL | ORAL_SOLUTION | Freq: Four times a day (QID) | ORAL | Status: DC | PRN
  Administered 2015-01-24 – 2015-01-30 (×3): 10 mL via ORAL
  Filled 2015-01-24: qty 5
  Filled 2015-01-24 (×2): qty 10
  Filled 2015-01-24: qty 5

## 2015-01-24 MED ORDER — VANCOMYCIN HCL IN DEXTROSE 750-5 MG/150ML-% IV SOLN
750.0000 mg | INTRAVENOUS | Status: AC
  Administered 2015-01-27: 750 mg via INTRAVENOUS
  Filled 2015-01-24 (×4): qty 150

## 2015-01-24 NOTE — Progress Notes (Signed)
Dr. Amado Coe paged and spoken to in regard to IV abx. Orders received for consult for pharmacy to dose vancomycin.

## 2015-01-24 NOTE — Telephone Encounter (Signed)
Lm on vm and provided verbal order

## 2015-01-24 NOTE — Progress Notes (Signed)
HD tx completed.

## 2015-01-24 NOTE — Progress Notes (Signed)
Pt requesting Imodium for loose stools. Primary RN  notified Dr. Amado Coe and received orders for Imodium once.

## 2015-01-24 NOTE — Progress Notes (Signed)
Dr. Amado Coe paged and spoken to in regard to nonproductive cough. Orders received from Dr. Cherylann Ratel  Tussinex for pt. Pt stateded that she has allergy of Codeine which is in the Tussinex. Orders received from Dr Amado Coe for Robutisum DM 10 mg PO q 6 hrs PRN

## 2015-01-24 NOTE — Progress Notes (Signed)
Post hd tx 

## 2015-01-24 NOTE — Progress Notes (Signed)
Pre-hd tx 

## 2015-01-24 NOTE — Progress Notes (Signed)
ANTIBIOTIC CONSULT NOTE - INITIAL  Pharmacy Consult for Vancomycin Indication: cellulitis  Allergies  Allergen Reactions  . Avelox [Moxifloxacin Hcl In Nacl] Shortness Of Breath, Swelling and Rash  . Codeine Shortness Of Breath and Other (See Comments)    Couldn't breath  . Theophyllines     Slight increased heart rate  . Ciprofloxacin Other (See Comments)    nervous  . Milk-Related Compounds Other (See Comments)    Whole milk hurts stomach. Not all dairy products do this.  . Tape     Please use paper tape only  . Lisinopril Rash    Patient Measurements: Height:  (162.6 cm) Weight: 151 lb 0.2 oz (68.5 kg) IBW/kg (Calculated) : 54.7 Adjusted Body Weight: 60.2 kg   Vital Signs: Temp: 98.1 F (36.7 C) (10/14 1402) Temp Source: Oral (10/14 1402) BP: 146/41 mmHg (10/14 1402) Pulse Rate: 85 (10/14 1402) Intake/Output from previous day: 10/13 0701 - 10/14 0700 In: 0  Out: 250 [Urine:50; Stool:200] Intake/Output from this shift:    Labs:  Recent Labs  01/22/15 1339 01/23/15 1824 01/24/15 0408  WBC 3.8  --  3.4*  HGB 7.5*  --  7.0*  PLT 79*  --  59*  LABCREA  --  123  --   CREATININE 5.44*  --  4.52*   Estimated Creatinine Clearance: 9.3 mL/min (by C-G formula based on Cr of 4.52). No results for input(s): VANCOTROUGH, VANCOPEAK, VANCORANDOM, GENTTROUGH, GENTPEAK, GENTRANDOM, TOBRATROUGH, TOBRAPEAK, TOBRARND, AMIKACINPEAK, AMIKACINTROU, AMIKACIN in the last 72 hours.   Microbiology: Recent Results (from the past 720 hour(s))  C difficile quick scan w PCR reflex     Status: None   Collection Time: 01/23/15  4:46 PM  Result Value Ref Range Status   C Diff antigen NEGATIVE NEGATIVE Final   C Diff toxin NEGATIVE NEGATIVE Final   C Diff interpretation Negative for C. difficile  Final    Medical History: Past Medical History  Diagnosis Date  . Dyspnea   . Esophageal reflux   . CAD (coronary artery disease)   . Hypertension   . Crohn's disease (HCC)   .  Allergic rhinitis   . COPD (chronic obstructive pulmonary disease) (HCC)   . History of kidney stones   . History of shingles   . Trigeminal neuralgia   . Family history of anesthesia complication     " SISTER HAD BAD FEELINGS "  . Anginal pain (HCC)   . Anxiety     ' JUST FOR MY BREATHING "  . Arthritis     mild in hands  . Hyperlipidemia   . CKD (chronic kidney disease)     Medications:  Prescriptions prior to admission  Medication Sig Dispense Refill Last Dose  . albuterol (PROVENTIL HFA;VENTOLIN HFA) 108 (90 BASE) MCG/ACT inhaler Inhale 2 puffs into the lungs every 6 (six) hours as needed for wheezing or shortness of breath. 1 Inhaler 11 Past Month at Unknown time  . albuterol (PROVENTIL) (2.5 MG/3ML) 0.083% nebulizer solution Take 3 mLs (2.5 mg total) by nebulization every 4 (four) hours as needed. Shortness of breath 75 mL 11 Past Month at Unknown time  . ALPRAZolam (XANAX) 0.5 MG tablet TAKE ONE TABLET BY MOUTH THREE TIMES DAILY AS NEEDED FOR ANXIETY 90 tablet 0 01/22/2015 at Unknown time  . arformoterol (BROVANA) 15 MCG/2ML NEBU Take 2 mLs (15 mcg total) by nebulization 2 (two) times daily. 60 mL prn 01/22/2015 at Unknown time  . BIOTIN 5000 PO Take 2 tablets  by mouth daily.   01/22/2015 at Unknown time  . Cholecalciferol (VITAMIN D-3) 5000 UNITS TABS Take 1 tablet by mouth daily.   01/21/2015 at Unknown time  . isosorbide mononitrate (IMDUR) 30 MG 24 hr tablet TAKE TWO TABLETS BY MOUTH DAILY 60 tablet 11 01/22/2015 at Unknown time  . loratadine (CLARITIN) 10 MG tablet Take 10 mg by mouth daily as needed.    Past Month at Unknown time  . magnesium oxide (MAG-OX) 400 MG tablet Take 400 mg by mouth daily.    01/22/2015 at Unknown time  . Multiple Vitamin (MULTIVITAMIN) tablet Take 1 tablet by mouth daily.    01/22/2015 at Unknown time  . nitroGLYCERIN (NITROSTAT) 0.4 MG SL tablet Place 1 tablet (0.4 mg total) under the tongue every 5 (five) minutes as needed. Chest pain 25 tablet 3  Past Month at Unknown time  . omeprazole (PRILOSEC) 20 MG capsule TAKE ONE CAPSULE BY MOUTH DAILY 90 capsule 3 01/22/2015 at Unknown time  . Zinc Acetate, Oral, (ZINC ACETATE PO) Take 1 tablet by mouth daily.   01/21/2015 at Unknown time   Assessment: Pharmacy consulted to dose Vancomycin in this 81 year old female admitted with Cellulitis, ESRD.  Pt had 1st HD session on 10/13 and an additional HD session on 10/14.  Will  Assume HD will be on MWF schedule but will need to confirm this.   Pt was previously treated with Cefazolin but condition continued to worsen.   Goal of Therapy:  Vancomycin trough 15 - 25 mcg/mL  Plan:  Expected duration 7 days with resolution of temperature and/or normalization of WBC  Will order Vancomycin 1250 mg IV X 1 loading dose to be given on 10/14 . Will order Vancomycin 750 mg IV Q MWF - HD as maintenance dose. Will draw 1st trough 30 minutes before 3rd HD session on 10/21 .   Chris Cripps D 01/24/2015,7:26 PM

## 2015-01-24 NOTE — Progress Notes (Signed)
HD tx start 

## 2015-01-24 NOTE — Progress Notes (Signed)
[facility] - [place] at [facility]   PATIENT NAME: Brandi Cline    MR#:  [id number]  DATE OF BIRTH:  [birth year]  SUBJECTIVE:  CHIEF COMPLAINT:  Patient was seen and examined during her second dialysis today at dialysis unit. Feeling weak and tired. Lower extremity redness is getting worse. Denies any shortness of breath   REVIEW OF SYSTEMS:  CONSTITUTIONAL: No fever, fatigue or weakness.  EYES: No blurred or double vision.  EARS, NOSE, AND THROAT: No tinnitus or ear pain.  RESPIRATORY: No cough, shortness of breath, wheezing or hemoptysis.  CARDIOVASCULAR: No chest pain, orthopnea, edema.  GASTROINTESTINAL: No nausea, vomiting, diarrhea or abdominal pain.  GENITOURINARY: No dysuria, hematuria.  ENDOCRINE: No polyuria, nocturia,  HEMATOLOGY: No anemia, easy bruising or bleeding SKIN: No rash or lesion. Left leg  redness is getting worse MUSCULOSKELETAL: No joint pain or arthritis.   NEUROLOGIC: No tingling, numbness, weakness.  PSYCHIATRY: No anxiety or depression.   DRUG ALLERGIES:   Allergies  Allergen Reactions  . Avelox [Moxifloxacin Hcl In Nacl] Shortness Of Breath, Swelling and Rash  . Codeine Shortness Of Breath and Other (See Comments)    Couldn't breath  . Theophyllines     Slight increased heart rate  . Ciprofloxacin Other (See Comments)    nervous  . Milk-Related Compounds Other (See Comments)    Whole milk hurts stomach. Not all dairy products do this.  . Tape     Please use paper tape only  . Lisinopril Rash    VITALS:  Blood pressure 146/41, pulse 85, temperature 98.1 F (36.7 C), temperature source Oral, resp. rate 18, height 5\' 4"  (1.626 m), weight 68.5 kg (151 lb 0.2 oz), SpO2 98 %.  PHYSICAL EXAMINATION:  GENERAL:  81 y.o.-year-old patient lying in the bed with no acute distress.  EYES: Pupils equal, round, reactive to light and accommodation. No scleral icterus. Extraocular muscles intact.  HEENT: Head atraumatic,  normocephalic. Oropharynx and nasopharynx clear.  NECK:  Supple, no jugular venous distention. No thyroid enlargement, no tenderness.  LUNGS: Normal breath sounds bilaterally, no wheezing, rales,rhonchi or crepitation. No use of accessory muscles of respiration.  CARDIOVASCULAR: S1, S2 normal. No murmurs, rubs, or gallops.  ABDOMEN: Soft, nontender, nondistended. Bowel sounds present. No organomegaly or mass.  EXTREMITIES: No pedal edema, cyanosis, or clubbing.  NEUROLOGIC: Cranial nerves II through XII are intact. Muscle strength 5/5 in all extremities. Sensation intact. Gait not checked.  PSYCHIATRIC: The patient is alert and oriented x 3.  SKIN: No obvious rash, lesion, or ulcer. Left lower extremity with worsening of erythema and tenderness still edematous   LABORATORY PANEL:   CBC  Recent Labs Lab 01/24/15 0408  WBC 3.4*  HGB 7.0*  HCT 21.8*  PLT 59*   ------------------------------------------------------------------------------------------------------------------  Chemistries   Recent Labs Lab 01/22/15 1339 01/24/15 0408  NA 144 144  K 4.6 3.4*  CL 119* 116*  CO2 14* 18*  GLUCOSE 131* 87  BUN 71* 52*  CREATININE 5.44* 4.52*  CALCIUM 8.3* 8.1*  MG 1.4* 1.8  AST 32  --   ALT 26  --   ALKPHOS 143*  --   BILITOT 1.2  --    ------------------------------------------------------------------------------------------------------------------  Cardiac Enzymes No results for input(s): TROPONINI in the last 168 hours. ------------------------------------------------------------------------------------------------------------------  RADIOLOGY:  US Renal  01/23/2015  CLINICAL DATA:  Acute renal failure. EXAM: RENAL / URINARY TRACT ULTRASOUND COMPLETE COMPARISON:  01/11/2015 FINDINGS: Right Kidney: Length: 12.1 cm. Echogenicity is diffusely increased. No  mass or hydronephrosis visualized. Left Kidney: Length: 11.6 cm. Echogenicity is diffusely increased. No mass or  hydronephrosis visualized. 7 mm calcific focus is seen in the inferior pole of the left kidney, likely representing renal calculus. Bladder: Appears normal for degree of bladder distention. Moderate degree of ascites.  Lobular appearance of the liver. IMPRESSION: Bilateral diffusely increased echogenicity of the kidneys, consistent with medical renal disease. Possible nonobstructing left lower pole renal calculus. Moderate amount of abdominal ascites. Lobular appearance of the liver contour, likely secondary to cirrhosis. Electronically Signed   By: Ted Mcalpine M.D.   On: 01/23/2015 18:14    EKG:   Orders placed or performed during the hospital encounter of 09/19/13  . EKG 12-Lead  . EKG 12-Lead  . EKG    ASSESSMENT AND PLAN:   #1 acute renal failure on CKD3 progress to ESRD ?-  With volume overload with edema and shortness of breath at the time of admission  Per nephrology clinic report her creatinine has not improved since her discharge on 10/5.  Had a temporary dialysis catheter placed in the right femoral vein by vascular surgery 10/12 Patient had second time hemodialysis today. We'll follow up with nephrology  Will monitor I's and O's and daily weights.  Will need to discuss the possibility of long-term dialysis.  #2 left lower extremity cellulitis: Worsening Follow-up blood cultures.  Elevate the leg.  Discontinue  Ancef. We will start the patient on vancomycin with renal dosing  #3 chronic respiratory failure on 3 L nasal cannula due to COPD due to smoking: Oxygenation is stable at this time.   chest x-ray with no acute abnormalities. Continue home regimen of brovana and Spiriva  #4 anemia: Last hemoglobin recorded at 7.0. Likely anemia of chronic disease.  Repeat CBC in a.m. and will consider transfusion during hemodialysis if hemoglobin is less  7  #5 GERD: protonix  #6 anxiety: continue xanax as she takes this at home.  #7 thrombocytopenia Discontinued  Lovenox. Discontinued heparin flushes and heparin given during dialysis. Will request nephrology not to give any heparin during dialysis in view of low platelet count at 59,000  CODE STATUS: DNR discussed with patient and daughter and sister at bedside on admission.     All the records are reviewed and case discussed with Care Management/Social Workerr. Management plans discussed with the patient, family and they are in agreement.    TOTAL TIME TAKING CARE OF THIS PATIENT: 35 minutes.   POSSIBLE D/C IN ? DAYS, DEPENDING ON CLINICAL CONDITION.   Ramonita Lab M.D on 01/24/2015 at 3:18 PM  Between 7am to 6pm - Pager - [phone] After 6pm go to [email or web address] - password [email or web address] [facility]  [place] [facility]  Office  [phone]  CC: Primary care physician; [name], MD

## 2015-01-24 NOTE — Progress Notes (Signed)
[facility]  ROUNDING NOTE   Subjective:  Pt had HD treatment yesterday. Tolerated well. Due for second HD treatment today. Renal US showed medical renal disease.  No hydronephrosis noted. Cirrhosis also noted.    Objective:  Vital signs in last 24 hours:  Temp:  [97.7 F (36.5 C)-98 F (36.7 C)] 97.7 F (36.5 C) (10/14 1045) Pulse Rate:  [67-78] 67 (10/14 1100) Resp:  [16-26] 16 (10/14 1100) BP: (111-149)/(31-70) 149/70 mmHg (10/14 1100) SpO2:  [96 %-100 %] 100 % (10/14 1045) Weight:  [68.1 kg (150 lb 2.1 oz)-71.4 kg (157 lb 6.5 oz)] 69 kg (152 lb 1.9 oz) (10/14 1045)  Weight change: 4.359 kg (9 lb 9.7 oz) Filed Weights   01/23/15 1145 01/24/15 0542 01/24/15 1045  Weight: 71.4 kg (157 lb 6.5 oz) 68.1 kg (150 lb 2.1 oz) 69 kg (152 lb 1.9 oz)    Intake/Output: I/O last 3 completed shifts: In: 82 [IV Piggyback:71] Out: 800 [Urine:600; Stool:200]   Intake/Output this shift:  Total I/O In: 120 [P.O.:120] Out: 100 [Urine:100]  Physical Exam: General: NAD  Head: Normocephalic, atraumatic. Moist oral mucosal membranes  Eyes: Anicteric  Neck: Supple, trachea midline  Lungs:  Clear to auscultation normal effort  Heart: Regular rate and rhythm  Abdomen:  Soft, nontender, BS present  Extremities:  trace peripheral edema.  Neurologic: Nonfocal, moving all four extremities  Skin: No lesions  Access: Right femoral temp HD catheter.    Basic Metabolic Panel:  Recent Labs Lab 01/22/15 1339 01/23/15 1040 01/24/15 0408  NA 144  --  144  K 4.6  --  3.4*  CL 119*  --  116*  CO2 14*  --  18*  GLUCOSE 131*  --  87  BUN 71*  --  52*  CREATININE 5.44*  --  4.52*  CALCIUM 8.3*  --  8.1*  MG 1.4*  --  1.8  PHOS 8.2* 7.4*  --     Liver Function Tests:  Recent Labs Lab 01/22/15 1339  AST 32  ALT 26  ALKPHOS 143*  BILITOT 1.2  PROT 5.6*  ALBUMIN 2.9*   No results for input(s): LIPASE, AMYLASE in the last 168 hours. No results for input(s):  AMMONIA in the last 168 hours.  CBC:  Recent Labs Lab 01/22/15 1339 01/24/15 0408  WBC 3.8 3.4*  HGB 7.5* 7.0*  HCT 23.8* 21.8*  MCV 91.1 90.3  PLT 79* 59*    Cardiac Enzymes: No results for input(s): CKTOTAL, CKMB, CKMBINDEX, TROPONINI in the last 168 hours.  BNP: Invalid input(s): POCBNP  CBG: No results for input(s): GLUCAP in the last 168 hours.  Microbiology: Results for orders placed or performed during the hospital encounter of 01/22/15  C difficile quick scan w PCR reflex     Status: None   Collection Time: 01/23/15  4:46 PM  Result Value Ref Range Status   C Diff antigen NEGATIVE NEGATIVE Final   C Diff toxin NEGATIVE NEGATIVE Final   C Diff interpretation Negative for C. difficile  Final    Coagulation Studies: No results for input(s): LABPROT, INR in the last 72 hours.  Urinalysis:  Recent Labs  01/23/15 1824  COLORURINE YELLOW*  LABSPEC 1.013  PHURINE 5.0  GLUCOSEU NEGATIVE  HGBUR 1+*  BILIRUBINUR NEGATIVE  KETONESUR NEGATIVE  PROTEINUR NEGATIVE  NITRITE NEGATIVE  LEUKOCYTESUR 3+*      Imaging: US Renal  01/23/2015  CLINICAL DATA:  Acute renal failure. EXAM: RENAL / URINARY TRACT ULTRASOUND COMPLETE  COMPARISON:  01/11/2015 FINDINGS: Right Kidney: Length: 12.1 cm. Echogenicity is diffusely increased. No mass or hydronephrosis visualized. Left Kidney: Length: 11.6 cm. Echogenicity is diffusely increased. No mass or hydronephrosis visualized. 7 mm calcific focus is seen in the inferior pole of the left kidney, likely representing renal calculus. Bladder: Appears normal for degree of bladder distention. Moderate degree of ascites.  Lobular appearance of the liver. IMPRESSION: Bilateral diffusely increased echogenicity of the kidneys, consistent with medical renal disease. Possible nonobstructing left lower pole renal calculus. Moderate amount of abdominal ascites. Lobular appearance of the liver contour, likely secondary to cirrhosis. Electronically  Signed   By: Fidela Salisbury M.D.   On: 01/23/2015 18:14   Dg Chest Port 1 View  01/22/2015  CLINICAL DATA:  Increasing shortness of Breath, history of COPD EXAM: PORTABLE CHEST - 1 VIEW COMPARISON:  04/10/2014 FINDINGS: Cardiac shadow is again mildly enlarged. Diffuse interstitial changes are again noted. No focal infiltrate or sizable effusion is seen. No bony abnormality is noted. IMPRESSION: Chronic changes without acute abnormality. Electronically Signed   By: Inez Catalina M.D.   On: 01/22/2015 14:22     Medications:   . sodium chloride 50 mL/hr at 01/24/15 1027   . arformoterol  15 mcg Nebulization BID  .  ceFAZolin (ANCEF) IV  1 g Intravenous Q24H  . feeding supplement (ENSURE ENLIVE)  237 mL Oral Q24H  . pantoprazole  40 mg Oral Daily   sodium chloride, sodium chloride, acetaminophen **OR** acetaminophen, albuterol, ALPRAZolam, alteplase, guaiFENesin-dextromethorphan, heparin, lidocaine (PF), lidocaine-prilocaine, nitroGLYCERIN, ondansetron **OR** ondansetron (ZOFRAN) IV, pentafluoroprop-tetrafluoroeth, polyethylene glycol  Assessment/ Plan:  81 y.o. female Coronary disease- with coronary stents, Hypertension, Crohn's disease, COPD, uses home oxygen around the clock, Kidney stones- lithotripsy, ureteral stents, History of shingles, Arthritis, Hyperlipidemia, Partial resection of bowel for Crohn's disease, liver cirrhosis.   1.  Acute renal failure of undetermined etiology/CKD stage III.  The patient has had progressively worsening renal function since July of 2016.  EGFR was down to 6 as outpt most recently. Exact etiology of kidney disease unclear, had Bactrim in May. - Unifying diagnosis unclear.  Pt has liver failure as well, cirrhosis with ascites.  Full serologic work up sent again.  Will plan for second HD treatment today.  May need to consider renal biopsy early next week.  2.  Anemia of CKD:  hgb down to 7, consider blood transfusion.  3.  SHPTH of renal origin:  Phos  should come down with HD, continue to monitor.     LOS: 2 Nalu [place]ouble[place] 10/14/201611:24 AM

## 2015-01-24 NOTE — Progress Notes (Signed)
Primary nurse spoke to Dr. Cherylann Ratel in person. Orders received to start NS @ 50 ml an hour; Hold Imdur due to low BP; Order Tussinex for pt for nonproductive cough.

## 2015-01-24 NOTE — Progress Notes (Signed)
Called Dr. Anne Hahn regarding patient's Hgb level of 7.0.  Doctor wants to continue to monitor levels at this time.  Arturo Morton  01/24/2015  5:47 AM

## 2015-01-25 LAB — CBC
HEMATOCRIT: 22.5 % — AB (ref 35.0–47.0)
Hemoglobin: 7.3 g/dL — ABNORMAL LOW (ref 12.0–16.0)
MCH: 28.9 pg (ref 26.0–34.0)
MCHC: 32.3 g/dL (ref 32.0–36.0)
MCV: 89.5 fL (ref 80.0–100.0)
PLATELETS: 46 10*3/uL — AB (ref 150–440)
RBC: 2.51 MIL/uL — ABNORMAL LOW (ref 3.80–5.20)
RDW: 15.5 % — AB (ref 11.5–14.5)
WBC: 4.1 10*3/uL (ref 3.6–11.0)

## 2015-01-25 LAB — RETICULOCYTES
RBC.: 2.65 MIL/uL — ABNORMAL LOW (ref 3.80–5.20)
Retic Count, Absolute: 53 10*3/uL (ref 19.0–183.0)
Retic Ct Pct: 2 % (ref 0.4–3.1)

## 2015-01-25 MED ORDER — HEPARIN SODIUM (PORCINE) 1000 UNIT/ML IJ SOLN
1000.0000 [IU] | Freq: Once | INTRAMUSCULAR | Status: AC
  Administered 2015-01-25: 3600 [IU]

## 2015-01-25 MED ORDER — BENZONATATE 100 MG PO CAPS
100.0000 mg | ORAL_CAPSULE | Freq: Three times a day (TID) | ORAL | Status: DC | PRN
  Administered 2015-01-25 – 2015-02-01 (×4): 100 mg via ORAL
  Filled 2015-01-25 (×4): qty 1

## 2015-01-25 MED ORDER — VANCOMYCIN HCL IN DEXTROSE 750-5 MG/150ML-% IV SOLN
750.0000 mg | Freq: Once | INTRAVENOUS | Status: AC
  Administered 2015-01-25: 750 mg via INTRAVENOUS
  Filled 2015-01-25: qty 150

## 2015-01-25 MED ORDER — LOPERAMIDE HCL 2 MG PO CAPS
2.0000 mg | ORAL_CAPSULE | Freq: Four times a day (QID) | ORAL | Status: DC | PRN
  Administered 2015-01-25 – 2015-02-11 (×20): 2 mg via ORAL
  Filled 2015-01-25 (×21): qty 1

## 2015-01-25 NOTE — Progress Notes (Signed)
[facility] [place] [facility]   PATIENT NAME: Brandi Cline    MR#:  [id number]  DATE OF BIRTH:  09/11/1930  SUBJECTIVE:  CHIEF COMPLAINT:  Patient is feeling okay. Denies any bleeding or bruises. Schedule for third hemodialysis today   REVIEW OF SYSTEMS:  CONSTITUTIONAL: No fever, fatigue or weakness.  EYES: No blurred or double vision.  EARS, NOSE, AND THROAT: No tinnitus or ear pain.  RESPIRATORY: No cough, shortness of breath, wheezing or hemoptysis.  CARDIOVASCULAR: No chest pain, orthopnea, edema.  GASTROINTESTINAL: No nausea, vomiting, diarrhea or abdominal pain.  GENITOURINARY: No dysuria, hematuria.  ENDOCRINE: No polyuria, nocturia,  HEMATOLOGY: No anemia, easy bruising or bleeding SKIN: No rash or lesion. Left leg  redness is slightly better MUSCULOSKELETAL: No joint pain or arthritis.   NEUROLOGIC: No tingling, numbness, weakness.  PSYCHIATRY: No anxiety or depression.   DRUG ALLERGIES:   Allergies  Allergen Reactions  . Avelox [Moxifloxacin Hcl In Nacl] Shortness Of Breath, Swelling and Rash  . Codeine Shortness Of Breath and Other (See Comments)    Couldn't breath  . Theophyllines     Slight increased heart rate  . Ciprofloxacin Other (See Comments)    nervous  . Milk-Related Compounds Other (See Comments)    Whole milk hurts stomach. Not all dairy products do this.  . Tape     Please use paper tape only  . Lisinopril Rash    VITALS:  Blood pressure 124/40, pulse 74, temperature 98.3 F (36.8 C), temperature source Oral, resp. rate 20, height 5\' 4"  (1.626 m), weight 70.2 kg (154 lb 12.2 oz), SpO2 95 %.  PHYSICAL EXAMINATION:  GENERAL:  81 y.o.-year-old patient lying in the bed with no acute distress.  EYES: Pupils equal, round, reactive to light and accommodation. No scleral icterus. Extraocular muscles intact.  HEENT: Head atraumatic, normocephalic. Oropharynx and nasopharynx clear.  NECK:  Supple, no jugular venous  distention. No thyroid enlargement, no tenderness.  LUNGS: Normal breath sounds bilaterally, no wheezing, rales,rhonchi or crepitation. No use of accessory muscles of respiration.  CARDIOVASCULAR: S1, S2 normal. No murmurs, rubs, or gallops.  ABDOMEN: Soft, nontender, nondistended. Bowel sounds present. No organomegaly or mass.  EXTREMITIES: No pedal edema, cyanosis, or clubbing.  NEUROLOGIC: Cranial nerves II through XII are intact. Muscle strength 5/5 in all extremities. Sensation intact. Gait not checked.  PSYCHIATRIC: The patient is alert and oriented x 3.  SKIN: No obvious rash, lesion, or ulcer. Left lower extremity with erythema and tender still edematous   LABORATORY PANEL:   CBC  Recent Labs Lab 01/25/15 0622  WBC 4.1  HGB 7.3*  HCT 22.5*  PLT 46*   ------------------------------------------------------------------------------------------------------------------  Chemistries   Recent Labs Lab 01/22/15 1339 01/24/15 0408  NA 144 144  K 4.6 3.4*  CL 119* 116*  CO2 14* 18*  GLUCOSE 131* 87  BUN 71* 52*  CREATININE 5.44* 4.52*  CALCIUM 8.3* 8.1*  MG 1.4* 1.8  AST 32  --   ALT 26  --   ALKPHOS 143*  --   BILITOT 1.2  --    ------------------------------------------------------------------------------------------------------------------  Cardiac Enzymes No results for input(s): TROPONINI in the last 168 hours. ------------------------------------------------------------------------------------------------------------------  RADIOLOGY:  US Renal  01/23/2015  CLINICAL DATA:  Acute renal failure. EXAM: RENAL / URINARY TRACT ULTRASOUND COMPLETE COMPARISON:  01/11/2015 FINDINGS: Right Kidney: Length: 12.1 cm. Echogenicity is diffusely increased. No mass or hydronephrosis visualized. Left Kidney: Length: 11.6 cm. Echogenicity is diffusely increased. No mass or  hydronephrosis visualized. 7 mm calcific focus is seen in the inferior pole of the left kidney, likely  representing renal calculus. Bladder: Appears normal for degree of bladder distention. Moderate degree of ascites.  Lobular appearance of the liver. IMPRESSION: Bilateral diffusely increased echogenicity of the kidneys, consistent with medical renal disease. Possible nonobstructing left lower pole renal calculus. Moderate amount of abdominal ascites. Lobular appearance of the liver contour, likely secondary to cirrhosis. Electronically Signed   By: Ted Mcalpine M.D.   On: 01/23/2015 18:14    EKG:   Orders placed or performed during the hospital encounter of 09/19/13  . EKG 12-Lead  . EKG 12-Lead  . EKG    ASSESSMENT AND PLAN:   #1 acute renal failure on CKD3 progress to ESRD ?-  With volume overload with edema and shortness of breath at the time of admission  Per nephrology clinic report her creatinine has not improved since her discharge on 10/5.  Had a temporary dialysis catheter placed in the right femoral vein by vascular surgery 10/12 Patient for her third dialysis today. We'll follow up with nephrology  Dialysis catheter will be packed with citrate instead of heparin in view of thrombocytopenia Patient will get renal biopsy on Monday Will monitor I's and O's and daily weights.  Will need to discuss the possibility of long-term dialysis.  #2 left lower extremity cellulitis: Follow-up blood cultures.  Elevate the leg.  Discontinue  Ancef. the patient is started on on vancomycin 01/24/2015 and will continue the same with renal dosing  #3 chronic respiratory failure on 3 L nasal cannula due to COPD due to smoking: Oxygenation is stable at this time.   chest x-ray with no acute abnormalities. Continue home regimen of brovana and Spiriva  #4 anemia: Last hemoglobin recorded at 7.3 Likely anemia of chronic disease.  Repeat CBC in a.m. and will consider transfusion during hemodialysis if hemoglobin is less  7  #5 GERD: protonix  #6 anxiety: continue xanax as she takes this at  home.  #7 thrombocytopenia Discontinued Lovenox. Discontinued heparin flushes and heparin given during dialysis.  low platelet count at 46,000 Hit panel  CODE STATUS: DNR ,discussed with patient and daughter and sister at bedside on admission.     All the records are reviewed and case discussed with Care Management/Social Workerr. Management plans discussed with the patient, family and they are in agreement.    TOTAL TIME TAKING CARE OF THIS PATIENT: 35 minutes.   POSSIBLE D/C IN ? DAYS, DEPENDING ON CLINICAL CONDITION.   Ramonita Lab M.D on 01/25/2015 at 1:18 PM  Between 7am to 6pm - Pager - [phone] After 6pm go to [email or web address] - password [email or web address] [facility]  [facility]  Office  [phone]  CC: Primary care physician; [name], MD

## 2015-01-25 NOTE — Progress Notes (Signed)
Pt ambulated to the bathroom and stated she did not have any weakness in her legs while ambulating. She ambualted well.   Brandi Cline

## 2015-01-25 NOTE — Progress Notes (Signed)
[facility]  ROUNDING NOTE   Subjective:  Pt seen at bedside. Family at bedside. Updated pt and family on current condition. Etiology of renal failure still not clear.  Serologic work up still pending.   Objective:  Vital signs in last 24 hours:  Temp:  [98.3 F (36.8 C)-98.8 F (37.1 C)] 98.3 F (36.8 C) (10/15 1357) Pulse Rate:  [73-79] 74 (10/15 1358) Resp:  [17-20] 17 (10/15 1357) BP: (112-144)/(33-40) 144/35 mmHg (10/15 1358) SpO2:  [95 %-100 %] 99 % (10/15 1357) Weight:  [70.2 kg (154 lb 12.2 oz)] 70.2 kg (154 lb 12.2 oz) (10/15 0500)  Weight change: -2.4 kg (-5 lb 4.7 oz) Filed Weights   01/24/15 1045 01/24/15 1345 01/25/15 0500  Weight: 69 kg (152 lb 1.9 oz) 68.5 kg (151 lb 0.2 oz) 70.2 kg (154 lb 12.2 oz)    Intake/Output: I/O last 3 completed shifts: In: 84 [P.O.:120; I.V.:504] Out: 1075 [Urine:575; Other:500]   Intake/Output this shift:  Total I/O In: -  Out: 125 [Urine:125]  Physical Exam: General: NAD  Head: Normocephalic, atraumatic. Moist oral mucosal membranes  Eyes: Anicteric  Neck: Supple, trachea midline  Lungs:  Clear to auscultation normal effort  Heart: Regular rate and rhythm  Abdomen:  Soft, nontender, BS present  Extremities:  trace peripheral edema.  Neurologic: Nonfocal, moving all four extremities  Skin: No lesions  Access: Right femoral temp HD catheter.    Basic Metabolic Panel:  Recent Labs Lab 01/22/15 1339 01/23/15 1040 01/24/15 0408  NA 144  --  144  K 4.6  --  3.4*  CL 119*  --  116*  CO2 14*  --  18*  GLUCOSE 131*  --  87  BUN 71*  --  52*  CREATININE 5.44*  --  4.52*  CALCIUM 8.3*  --  8.1*  MG 1.4*  --  1.8  PHOS 8.2* 7.4*  --     Liver Function Tests:  Recent Labs Lab 01/22/15 1339  AST 32  ALT 26  ALKPHOS 143*  BILITOT 1.2  PROT 5.6*  ALBUMIN 2.9*   No results for input(s): LIPASE, AMYLASE in the last 168 hours. No results for input(s): AMMONIA in the last 168  hours.  CBC:  Recent Labs Lab 01/22/15 1339 01/24/15 0408 01/25/15 0622  WBC 3.8 3.4* 4.1  HGB 7.5* 7.0* 7.3*  HCT 23.8* 21.8* 22.5*  MCV 91.1 90.3 89.5  PLT 79* 59* 46*    Cardiac Enzymes: No results for input(s): CKTOTAL, CKMB, CKMBINDEX, TROPONINI in the last 168 hours.  BNP: Invalid input(s): POCBNP  CBG: No results for input(s): GLUCAP in the last 168 hours.  Microbiology: Results for orders placed or performed during the hospital encounter of 01/22/15  C difficile quick scan w PCR reflex     Status: None   Collection Time: 01/23/15  4:46 PM  Result Value Ref Range Status   C Diff antigen NEGATIVE NEGATIVE Final   C Diff toxin NEGATIVE NEGATIVE Final   C Diff interpretation Negative for C. difficile  Final    Coagulation Studies: No results for input(s): LABPROT, INR in the last 72 hours.  Urinalysis:  Recent Labs  01/23/15 1824  COLORURINE YELLOW*  LABSPEC 1.013  PHURINE 5.0  GLUCOSEU NEGATIVE  HGBUR 1+*  BILIRUBINUR NEGATIVE  KETONESUR NEGATIVE  PROTEINUR NEGATIVE  NITRITE NEGATIVE  LEUKOCYTESUR 3+*      Imaging: US Renal  01/23/2015  CLINICAL DATA:  Acute renal failure. EXAM: RENAL / URINARY TRACT  ULTRASOUND COMPLETE COMPARISON:  01/11/2015 FINDINGS: Right Kidney: Length: 12.1 cm. Echogenicity is diffusely increased. No mass or hydronephrosis visualized. Left Kidney: Length: 11.6 cm. Echogenicity is diffusely increased. No mass or hydronephrosis visualized. 7 mm calcific focus is seen in the inferior pole of the left kidney, likely representing renal calculus. Bladder: Appears normal for degree of bladder distention. Moderate degree of ascites.  Lobular appearance of the liver. IMPRESSION: Bilateral diffusely increased echogenicity of the kidneys, consistent with medical renal disease. Possible nonobstructing left lower pole renal calculus. Moderate amount of abdominal ascites. Lobular appearance of the liver contour, likely secondary to cirrhosis.  Electronically Signed   By: Fidela Salisbury M.D.   On: 01/23/2015 18:14     Medications:   . sodium chloride 50 mL/hr at 01/25/15 0852   . arformoterol  15 mcg Nebulization BID  . feeding supplement (ENSURE ENLIVE)  237 mL Oral Q24H  . pantoprazole  40 mg Oral Daily  . [START ON 01/27/2015] vancomycin  750 mg Intravenous Q M,W,F-HD   sodium chloride, sodium chloride, acetaminophen **OR** acetaminophen, albuterol, ALPRAZolam, alteplase, guaiFENesin-dextromethorphan, lidocaine (PF), lidocaine-prilocaine, nitroGLYCERIN, ondansetron **OR** ondansetron (ZOFRAN) IV, pentafluoroprop-tetrafluoroeth, polyethylene glycol  Assessment/ Plan:  81 y.o. female with past medical history of coronary disease- with coronary stents, Hypertension, Crohn's disease, COPD, uses home oxygen around the clock, Kidney stones- lithotripsy, ureteral stents, History of shingles, Arthritis, Hyperlipidemia, Partial resection of bowel for Crohn's disease, liver cirrhosis.   1.  Acute renal failure of undetermined etiology/CKD stage III.  The patient has had progressively worsening renal function since July of 2016.  EGFR was down to 6 as outpt most recently. Exact etiology of kidney disease unclear, had Bactrim in May. Has had rather fast renal function decline. - Full serologic work up sent again, has been started on dialysis, will need renal biopsy on Monday.  Will order pre-op labs on Sunday.  Discussed risks, benefits, alternatives to renal biopsy and patient willing to proceed.  For HD today.   2.  Anemia of CKD/thrombocytopenia:  hgb 7.3 today.  ? If she has thrombocytopenia from liver disease.  Will pack catheter with citrate for now.  Consider HIT studies, though thrombocytopenia appears multifactorial.  Will likely need to transfuse platelets tomorrow and Sunday in preparation for renal biopsy.  Will obtain input from hematology.  3.  SHPTH of renal origin:  Most recent phos down to 7.4, will continue to  monitor.     LOS: 3 Faust Thorington 10/15/20162:39 PM

## 2015-01-25 NOTE — Progress Notes (Signed)
ANTIBIOTIC CONSULT NOTE - Follow-up  Pharmacy Consult for Vancomycin Indication: cellulitis  Allergies  Allergen Reactions  . Avelox [Moxifloxacin Hcl In Nacl] Shortness Of Breath, Swelling and Rash  . Codeine Shortness Of Breath and Other (See Comments)    Couldn't breath  . Theophyllines     Slight increased heart rate  . Ciprofloxacin Other (See Comments)    nervous  . Milk-Related Compounds Other (See Comments)    Whole milk hurts stomach. Not all dairy products do this.  . Tape     Please use paper tape only  . Lisinopril Rash    Patient Measurements: Height: 5\' 4"  (162.6 cm) Weight: 154 lb 12.2 oz (70.2 kg) IBW/kg (Calculated) : 54.7 Adjusted Body Weight: 60.2 kg   Vital Signs: Temp: 98.3 F (36.8 C) (10/15 0420) Temp Source: Oral (10/15 0420) BP: 124/40 mmHg (10/15 0625) Pulse Rate: 74 (10/15 0620) Intake/Output from previous day: 10/14 0701 - 10/15 0700 In: 624 [P.O.:120; I.V.:504] Out: 1025 [Urine:525] Intake/Output from this shift:    Labs:  Recent Labs  01/22/15 1339 01/23/15 1824 01/24/15 0408 01/25/15 0622  WBC 3.8  --  3.4* 4.1  HGB 7.5*  --  7.0* 7.3*  PLT 79*  --  59* 46*  LABCREA  --  123  --   --   CREATININE 5.44*  --  4.52*  --    Estimated Creatinine Clearance: 9.4 mL/min (by C-G formula based on Cr of 4.52). No results for input(s): VANCOTROUGH, VANCOPEAK, VANCORANDOM, GENTTROUGH, GENTPEAK, GENTRANDOM, TOBRATROUGH, TOBRAPEAK, TOBRARND, AMIKACINPEAK, AMIKACINTROU, AMIKACIN in the last 72 hours.   Microbiology: Recent Results (from the past 720 hour(s))  C difficile quick scan w PCR reflex     Status: None   Collection Time: 01/23/15  4:46 PM  Result Value Ref Range Status   C Diff antigen NEGATIVE NEGATIVE Final   C Diff toxin NEGATIVE NEGATIVE Final   C Diff interpretation Negative for C. difficile  Final    Medical History: Past Medical History  Diagnosis Date  . Dyspnea   . Esophageal reflux   . CAD (coronary artery  disease)   . Hypertension   . Crohn's disease (HCC)   . Allergic rhinitis   . COPD (chronic obstructive pulmonary disease) (HCC)   . History of kidney stones   . History of shingles   . Trigeminal neuralgia   . Family history of anesthesia complication     " SISTER HAD BAD FEELINGS "  . Anginal pain (HCC)   . Anxiety     ' JUST FOR MY BREATHING "  . Arthritis     mild in hands  . Hyperlipidemia   . CKD (chronic kidney disease)     Medications:  Prescriptions prior to admission  Medication Sig Dispense Refill Last Dose  . albuterol (PROVENTIL HFA;VENTOLIN HFA) 108 (90 BASE) MCG/ACT inhaler Inhale 2 puffs into the lungs every 6 (six) hours as needed for wheezing or shortness of breath. 1 Inhaler 11 Past Month at Unknown time  . albuterol (PROVENTIL) (2.5 MG/3ML) 0.083% nebulizer solution Take 3 mLs (2.5 mg total) by nebulization every 4 (four) hours as needed. Shortness of breath 75 mL 11 Past Month at Unknown time  . ALPRAZolam (XANAX) 0.5 MG tablet TAKE ONE TABLET BY MOUTH THREE TIMES DAILY AS NEEDED FOR ANXIETY 90 tablet 0 01/22/2015 at Unknown time  . arformoterol (BROVANA) 15 MCG/2ML NEBU Take 2 mLs (15 mcg total) by nebulization 2 (two) times daily. 60 mL prn 01/22/2015  at Unknown time  . BIOTIN 5000 PO Take 2 tablets by mouth daily.   01/22/2015 at Unknown time  . Cholecalciferol (VITAMIN D-3) 5000 UNITS TABS Take 1 tablet by mouth daily.   01/21/2015 at Unknown time  . isosorbide mononitrate (IMDUR) 30 MG 24 hr tablet TAKE TWO TABLETS BY MOUTH DAILY 60 tablet 11 01/22/2015 at Unknown time  . loratadine (CLARITIN) 10 MG tablet Take 10 mg by mouth daily as needed.    Past Month at Unknown time  . magnesium oxide (MAG-OX) 400 MG tablet Take 400 mg by mouth daily.    01/22/2015 at Unknown time  . Multiple Vitamin (MULTIVITAMIN) tablet Take 1 tablet by mouth daily.    01/22/2015 at Unknown time  . nitroGLYCERIN (NITROSTAT) 0.4 MG SL tablet Place 1 tablet (0.4 mg total) under the  tongue every 5 (five) minutes as needed. Chest pain 25 tablet 3 Past Month at Unknown time  . omeprazole (PRILOSEC) 20 MG capsule TAKE ONE CAPSULE BY MOUTH DAILY 90 capsule 3 01/22/2015 at Unknown time  . Zinc Acetate, Oral, (ZINC ACETATE PO) Take 1 tablet by mouth daily.   01/21/2015 at Unknown time   Assessment: Pharmacy consulted to dose Vancomycin in this 81 year old female admitted with Cellulitis, ESRD.  Temporary HD with possible change to regular HD?[name] Pt had 1st HD session on 10/13 and an additional HD session on 10/14.  Will  Assume HD will be on MWF schedule but will need to confirm this.   Pt was previously treated with Cefazolin but condition continued to worsen.   Goal of Therapy:  Vancomycin trough 15 - 25 mcg/mL  Plan:  Expected duration 7 days with resolution of temperature and/or normalization of WBC  Will order Vancomycin 1250 mg IV X 1 loading dose to be given on 10/14 . Will order Vancomycin 750 mg IV Q MWF - HD as maintenance dose. Will draw 1st trough 30 minutes before 3rd HD session on 10/21 .   10/15: Patient received Loading Dose Vanc on 10/15 at 2045. MD= Vancomycin 750 mg QHD. Patient is Temporary HD so follow to determine trough timing.  Bari Mantis PharmD Clinical Pharmacist 01/25/2015

## 2015-01-25 NOTE — Progress Notes (Signed)
Dr Anne Hahn notified of pt request for prn xanax and current bp.  dbp low. Md aware and okay with pt receiving dose of xanax. Will continue to monitor.

## 2015-01-26 DIAGNOSIS — D696 Thrombocytopenia, unspecified: Secondary | ICD-10-CM

## 2015-01-26 DIAGNOSIS — D631 Anemia in chronic kidney disease: Secondary | ICD-10-CM

## 2015-01-26 DIAGNOSIS — J449 Chronic obstructive pulmonary disease, unspecified: Secondary | ICD-10-CM

## 2015-01-26 DIAGNOSIS — K7460 Unspecified cirrhosis of liver: Secondary | ICD-10-CM

## 2015-01-26 DIAGNOSIS — N189 Chronic kidney disease, unspecified: Secondary | ICD-10-CM

## 2015-01-26 DIAGNOSIS — L03116 Cellulitis of left lower limb: Secondary | ICD-10-CM

## 2015-01-26 DIAGNOSIS — K5090 Crohn's disease, unspecified, without complications: Secondary | ICD-10-CM

## 2015-01-26 LAB — VITAMIN B12: Vitamin B-12: 665 pg/mL (ref 180–914)

## 2015-01-26 LAB — HEPATITIS C ANTIBODY (REFLEX)

## 2015-01-26 LAB — KAPPA/LAMBDA LIGHT CHAINS
KAPPA, LAMDA LIGHT CHAIN RATIO: 1.78 — AB (ref 0.26–1.65)
Kappa free light chain: 69.69 mg/L — ABNORMAL HIGH (ref 3.30–19.40)
Lambda free light chains: 39.23 mg/L — ABNORMAL HIGH (ref 5.71–26.30)

## 2015-01-26 LAB — CBC WITH DIFFERENTIAL/PLATELET
Basophils Absolute: 0 10*3/uL (ref 0–0.1)
Basophils Relative: 0 %
Eosinophils Absolute: 0.1 10*3/uL (ref 0–0.7)
Eosinophils Relative: 2 %
HCT: 22.7 % — ABNORMAL LOW (ref 35.0–47.0)
Hemoglobin: 7.4 g/dL — ABNORMAL LOW (ref 12.0–16.0)
Lymphocytes Relative: 17 %
Lymphs Abs: 0.6 10*3/uL — ABNORMAL LOW (ref 1.0–3.6)
MCH: 29.1 pg (ref 26.0–34.0)
MCHC: 32.4 g/dL (ref 32.0–36.0)
MCV: 89.8 fL (ref 80.0–100.0)
Monocytes Absolute: 0.4 10*3/uL (ref 0.2–0.9)
Monocytes Relative: 11 %
Neutro Abs: 2.5 10*3/uL (ref 1.4–6.5)
Neutrophils Relative %: 70 %
Platelets: 50 10*3/uL — ABNORMAL LOW (ref 150–440)
RBC: 2.53 MIL/uL — ABNORMAL LOW (ref 3.80–5.20)
RDW: 15.4 % — ABNORMAL HIGH (ref 11.5–14.5)
WBC: 3.5 10*3/uL — ABNORMAL LOW (ref 3.6–11.0)

## 2015-01-26 LAB — COMPLEMENT, TOTAL

## 2015-01-26 LAB — FOLATE: Folate: 32 ng/mL (ref >5.9)

## 2015-01-26 LAB — PROTIME-INR
INR: 5.03
Prothrombin Time: 46.4 seconds — ABNORMAL HIGH (ref 11.4–15.0)

## 2015-01-26 LAB — ANTISTREPTOLYSIN O TITER

## 2015-01-26 LAB — DAT, POLYSPECIFIC AHG (ARMC ONLY): Polyspecific AHG test: NEGATIVE

## 2015-01-26 LAB — GLOMERULAR BASEMENT MEMBRANE ANTIBODIES: GBM Ab: 3 units (ref 0–20)

## 2015-01-26 LAB — APTT: aPTT: 47 seconds — ABNORMAL HIGH (ref 24–36)

## 2015-01-26 LAB — HCV COMMENT:

## 2015-01-26 LAB — ABO/RH: ABO/RH(D): A POS

## 2015-01-26 LAB — FIBRINOGEN: Fibrinogen: 286 mg/dL (ref 210–470)

## 2015-01-26 LAB — HEPATITIS B CORE ANTIBODY, IGM: Hep B C IgM: NEGATIVE

## 2015-01-26 LAB — TSH: TSH: 3.623 u[IU]/mL (ref 0.350–4.500)

## 2015-01-26 MED ORDER — VITAMIN K1 10 MG/ML IJ SOLN
5.0000 mg | Freq: Once | INTRAMUSCULAR | Status: AC
  Administered 2015-01-26: 5 mg via SUBCUTANEOUS
  Filled 2015-01-26: qty 0.5

## 2015-01-26 MED ORDER — SODIUM CHLORIDE 0.9 % IV SOLN: Freq: Once | INTRAVENOUS | Status: DC

## 2015-01-26 NOTE — Progress Notes (Signed)
ANTIBIOTIC CONSULT NOTE - Follow-up  Pharmacy Consult for Vancomycin Indication: cellulitis  Allergies  Allergen Reactions  . Avelox [Moxifloxacin Hcl In Nacl] Shortness Of Breath, Swelling and Rash  . Codeine Shortness Of Breath and Other (See Comments)    Couldn't breath  . Theophyllines     Slight increased heart rate  . Ciprofloxacin Other (See Comments)    nervous  . Milk-Related Compounds Other (See Comments)    Whole milk hurts stomach. Not all dairy products do this.  . Tape     Please use paper tape only  . Lisinopril Rash    Patient Measurements: Height:  (162.6 cm) Weight: 142 lb 11.2 oz (64.728 kg) IBW/kg (Calculated) : 54.7 Adjusted Body Weight: 60.2 kg   Vital Signs: Temp: 98 F (36.7 C) (10/16 1244) Temp Source: Oral (10/16 1244) BP: 144/44 mmHg (10/16 1244) Pulse Rate: 81 (10/16 1244) Intake/Output from previous day: 10/15 0701 - 10/16 0700 In: 2057 [P.O.:420; I.V.:1097] Out: 145 [Urine:145] Intake/Output from this shift: Total I/O In: 594.2 [P.O.:240; I.V.:354.2] Out: 0   Labs:  Recent Labs  01/23/15 1824 01/24/15 0408 01/25/15 0622 01/26/15 0658  WBC  --  3.4* 4.1 3.5*  HGB  --  7.0* 7.3* 7.4*  PLT  --  59* 46* 50*  LABCREA 123  --   --   --   CREATININE  --  4.52*  --   --    Estimated Creatinine Clearance: 8.4 mL/min (by C-G formula based on Cr of 4.52). No results for input(s): VANCOTROUGH, VANCOPEAK, VANCORANDOM, GENTTROUGH, GENTPEAK, GENTRANDOM, TOBRATROUGH, TOBRAPEAK, TOBRARND, AMIKACINPEAK, AMIKACINTROU, AMIKACIN in the last 72 hours.   Microbiology: Recent Results (from the past 720 hour(s))  C difficile quick scan w PCR reflex     Status: None   Collection Time: 01/23/15  4:46 PM  Result Value Ref Range Status   C Diff antigen NEGATIVE NEGATIVE Final   C Diff toxin NEGATIVE NEGATIVE Final   C Diff interpretation Negative for C. difficile  Final    Medical History: Past Medical History  Diagnosis Date  . Dyspnea    . Esophageal reflux   . CAD (coronary artery disease)   . Hypertension   . Crohn's disease (HCC)   . Allergic rhinitis   . COPD (chronic obstructive pulmonary disease) (HCC)   . History of kidney stones   . History of shingles   . Trigeminal neuralgia   . Family history of anesthesia complication     " SISTER HAD BAD FEELINGS "  . Anginal pain (HCC)   . Anxiety     ' JUST FOR MY BREATHING "  . Arthritis     mild in hands  . Hyperlipidemia   . CKD (chronic kidney disease)     Medications:  Prescriptions prior to admission  Medication Sig Dispense Refill Last Dose  . albuterol (PROVENTIL HFA;VENTOLIN HFA) 108 (90 BASE) MCG/ACT inhaler Inhale 2 puffs into the lungs every 6 (six) hours as needed for wheezing or shortness of breath. 1 Inhaler 11 Past Month at Unknown time  . albuterol (PROVENTIL) (2.5 MG/3ML) 0.083% nebulizer solution Take 3 mLs (2.5 mg total) by nebulization every 4 (four) hours as needed. Shortness of breath 75 mL 11 Past Month at Unknown time  . ALPRAZolam (XANAX) 0.5 MG tablet TAKE ONE TABLET BY MOUTH THREE TIMES DAILY AS NEEDED FOR ANXIETY 90 tablet 0 01/22/2015 at Unknown time  . arformoterol (BROVANA) 15 MCG/2ML NEBU Take 2 mLs (15 mcg total) by  nebulization 2 (two) times daily. 60 mL prn 01/22/2015 at Unknown time  . BIOTIN 5000 PO Take 2 tablets by mouth daily.   01/22/2015 at Unknown time  . Cholecalciferol (VITAMIN D-3) 5000 UNITS TABS Take 1 tablet by mouth daily.   01/21/2015 at Unknown time  . isosorbide mononitrate (IMDUR) 30 MG 24 hr tablet TAKE TWO TABLETS BY MOUTH DAILY 60 tablet 11 01/22/2015 at Unknown time  . loratadine (CLARITIN) 10 MG tablet Take 10 mg by mouth daily as needed.    Past Month at Unknown time  . magnesium oxide (MAG-OX) 400 MG tablet Take 400 mg by mouth daily.    01/22/2015 at Unknown time  . Multiple Vitamin (MULTIVITAMIN) tablet Take 1 tablet by mouth daily.    01/22/2015 at Unknown time  . nitroGLYCERIN (NITROSTAT) 0.4 MG SL  tablet Place 1 tablet (0.4 mg total) under the tongue every 5 (five) minutes as needed. Chest pain 25 tablet 3 Past Month at Unknown time  . omeprazole (PRILOSEC) 20 MG capsule TAKE ONE CAPSULE BY MOUTH DAILY 90 capsule 3 01/22/2015 at Unknown time  . Zinc Acetate, Oral, (ZINC ACETATE PO) Take 1 tablet by mouth daily.   01/21/2015 at Unknown time   Assessment: Pharmacy consulted to dose Vancomycin in this 80 year old female admitted with Cellulitis, ESRD.  Temporary HD with possible change to regular HD?[name] Pt had 1st HD session on 10/13 and an additional HD session on 10/14.  Will  Assume HD will be on MWF schedule but will need to confirm this.   Pt was previously treated with Cefazolin but condition continued to worsen.   Goal of Therapy:  Vancomycin trough 15 - 25 mcg/mL  Plan:  Expected duration 7 days with resolution of temperature and/or normalization of WBC  Will order Vancomycin 1250 mg IV X 1 loading dose to be given on 10/14 . Will order Vancomycin 750 mg IV Q MWF - HD as maintenance dose. Will draw 1st trough 30 minutes before 3rd HD session on 10/21 .   10/15: Patient received Loading Dose Vanc on 10/14 at 2045. MaintainenceDose= Vancomycin 750 mg QHD. Patient is Temporary HD so follow to determine trough timing.  10/16: 1st HD after Vancomycin was on 10/15. Per nephrology note, next HD on Monday 10/17. F/u for timing of trough.  Bari Mantis PharmD Clinical Pharmacist 01/26/2015 2:58 PM

## 2015-01-26 NOTE — Progress Notes (Signed)
[facility]  ROUNDING NOTE   Subjective:  Pt eating lunch this afternoon. Appreciate input from Dr. Mike Gip.  Has thrombocytopenia and coagulopathy which could both be attributable to liver disease.  Objective:  Vital signs in last 24 hours:  Temp:  [98 F (36.7 C)-98.6 F (37 C)] 98 F (36.7 C) (10/16 1244) Pulse Rate:  [69-81] 81 (10/16 1244) Resp:  [17-24] 19 (10/16 1244) BP: (126-155)/(34-62) 144/44 mmHg (10/16 1244) SpO2:  [94 %-100 %] 97 % (10/16 1244) Weight:  [64.728 kg (142 lb 11.2 oz)] 64.728 kg (142 lb 11.2 oz) (10/16 0500)  Weight change: -4.272 kg (-9 lb 6.7 oz) Filed Weights   01/24/15 1345 01/25/15 0500 01/26/15 0500  Weight: 68.5 kg (151 lb 0.2 oz) 70.2 kg (154 lb 12.2 oz) 64.728 kg (142 lb 11.2 oz)    Intake/Output: I/O last 3 completed shifts: In: 2561 [P.O.:420; I.V.:1601; Other:540] Out: 445 [Urine:445]   Intake/Output this shift:  Total I/O In: 354.2 [I.V.:354.2] Out: 0   Physical Exam: General: NAD  Head: Normocephalic, atraumatic. Moist oral mucosal membranes  Eyes: Anicteric  Neck: Supple, trachea midline  Lungs:  Clear to auscultation normal effort  Heart: Regular rate and rhythm  Abdomen:  Soft, nontender, BS present  Extremities:  trace peripheral edema.  Neurologic: Nonfocal, moving all four extremities  Skin: No lesions  Access: Right femoral temp HD catheter.    Basic Metabolic Panel:  Recent Labs Lab 01/22/15 1339 01/23/15 1040 01/24/15 0408  NA 144  --  144  K 4.6  --  3.4*  CL 119*  --  116*  CO2 14*  --  18*  GLUCOSE 131*  --  87  BUN 71*  --  52*  CREATININE 5.44*  --  4.52*  CALCIUM 8.3*  --  8.1*  MG 1.4*  --  1.8  PHOS 8.2* 7.4*  --     Liver Function Tests:  Recent Labs Lab 01/22/15 1339  AST 32  ALT 26  ALKPHOS 143*  BILITOT 1.2  PROT 5.6*  ALBUMIN 2.9*   No results for input(s): LIPASE, AMYLASE in the last 168 hours. No results for input(s): AMMONIA in the last 168  hours.  CBC:  Recent Labs Lab 01/22/15 1339 01/24/15 0408 01/25/15 0622 01/26/15 0658  WBC 3.8 3.4* 4.1 3.5*  NEUTROABS  --   --   --  2.5  HGB 7.5* 7.0* 7.3* 7.4*  HCT 23.8* 21.8* 22.5* 22.7*  MCV 91.1 90.3 89.5 89.8  PLT 79* 59* 46* 50*    Cardiac Enzymes: No results for input(s): CKTOTAL, CKMB, CKMBINDEX, TROPONINI in the last 168 hours.  BNP: Invalid input(s): POCBNP  CBG: No results for input(s): GLUCAP in the last 168 hours.  Microbiology: Results for orders placed or performed during the hospital encounter of 01/22/15  C difficile quick scan w PCR reflex     Status: None   Collection Time: 01/23/15  4:46 PM  Result Value Ref Range Status   C Diff antigen NEGATIVE NEGATIVE Final   C Diff toxin NEGATIVE NEGATIVE Final   C Diff interpretation Negative for C. difficile  Final    Coagulation Studies:  Recent Labs  01/26/15 0658  LABPROT 46.4*  INR 5.03*    Urinalysis:  Recent Labs  01/23/15 1824  COLORURINE YELLOW*  LABSPEC 1.013  PHURINE 5.0  GLUCOSEU NEGATIVE  HGBUR 1+*  BILIRUBINUR NEGATIVE  KETONESUR NEGATIVE  PROTEINUR NEGATIVE  NITRITE NEGATIVE  LEUKOCYTESUR 3+*  Imaging: No results found.   Medications:   . sodium chloride 50 mL/hr at 01/26/15 0528   . arformoterol  15 mcg Nebulization BID  . feeding supplement (ENSURE ENLIVE)  237 mL Oral Q24H  . pantoprazole  40 mg Oral Daily  . phytonadione  5 mg Subcutaneous Once  . [START ON 01/27/2015] vancomycin  750 mg Intravenous Q M,W,F-HD   sodium chloride, sodium chloride, acetaminophen **OR** acetaminophen, albuterol, ALPRAZolam, alteplase, benzonatate, guaiFENesin-dextromethorphan, lidocaine (PF), lidocaine-prilocaine, loperamide, nitroGLYCERIN, ondansetron **OR** ondansetron (ZOFRAN) IV, pentafluoroprop-tetrafluoroeth, polyethylene glycol  Assessment/ Plan:  81 y.o. female with past medical history of coronary disease- with coronary stents, Hypertension, Crohn's disease,  COPD, uses home oxygen around the clock, Kidney stones- lithotripsy, ureteral stents, History of shingles, Arthritis, Hyperlipidemia, Partial resection of bowel for Crohn's disease, liver cirrhosis.   1.  Acute renal failure of undetermined etiology/CKD stage III.  The patient has had progressively worsening renal function since July of 2016.  EGFR was down to 6 as outpt most recently. Exact etiology of kidney disease unclear, had Bactrim in May. Has had rather fast renal function decline. - Serologic work up pending, etiology of kidney disease unclear but could be related to crohns/liver disease.  Unsafe to perform biopsy at present given low platelets and coagulopathy.  Appreciate input from hematology.  Will plan for dialysis again tomorrow.   2.  Anemia of CKD/thrombocytopenia:  hgb 7.4 at the moment, low platelets noted at 50k.  Will need to address coagulopathy and thrombocytopenia prior to attempted renal biopsy.  Holding off on renal biopsy for now.  3.  SHPTH of renal origin: follow up phos with next HD treatment.    LOS: 4 Bennie Scaff 10/16/20161:49 PM

## 2015-01-26 NOTE — Progress Notes (Signed)
[facility] [place] [facility]   PATIENT NAME: Brandi Cline    MR#:  [id number]  DATE OF BIRTH:  03-29-1931  SUBJECTIVE:  CHIEF COMPLAINT:  Patient was seen and examined , reporting oozing of blood from temp dialysis cath site. Feeling weak and tired.     REVIEW OF SYSTEMS:  CONSTITUTIONAL: No fever, fatigue or weakness.  EYES: No blurred or double vision.  EARS, NOSE, AND THROAT: No tinnitus or ear pain.  RESPIRATORY: No cough, shortness of breath, wheezing or hemoptysis.  CARDIOVASCULAR: No chest pain, orthopnea, edema.  GASTROINTESTINAL: No nausea, vomiting, diarrhea or abdominal pain.  GENITOURINARY: No dysuria, hematuria.  ENDOCRINE: No polyuria, nocturia,  HEMATOLOGY: No anemia, reports bruising and oozing of blood from dialysis cath SKIN: No rash or lesion. Left leg  redness is slightly better MUSCULOSKELETAL: No joint pain or arthritis.   NEUROLOGIC: No tingling, numbness, weakness.  PSYCHIATRY: No anxiety or depression.   DRUG ALLERGIES:   Allergies  Allergen Reactions  . Avelox [Moxifloxacin Hcl In Nacl] Shortness Of Breath, Swelling and Rash  . Codeine Shortness Of Breath and Other (See Comments)    Couldn't breath  . Theophyllines     Slight increased heart rate  . Ciprofloxacin Other (See Comments)    nervous  . Milk-Related Compounds Other (See Comments)    Whole milk hurts stomach. Not all dairy products do this.  . Tape     Please use paper tape only  . Lisinopril Rash    VITALS:  Blood pressure 141/44, pulse 80, temperature 98.4 F (36.9 C), temperature source Oral, resp. rate 24, height  (1.626 m), weight 64.728 kg (142 lb 11.2 oz), SpO2 96 %.  PHYSICAL EXAMINATION:  GENERAL:  81 y.o.-year-old patient lying in the bed with no acute distress.  EYES: Pupils equal, round, reactive to light and accommodation. No scleral icterus. Extraocular muscles intact.  HEENT: Head atraumatic, normocephalic. Oropharynx and  nasopharynx clear.  NECK:  Supple, no jugular venous distention. No thyroid enlargement, no tenderness.  LUNGS: Normal breath sounds bilaterally, no wheezing, rales,rhonchi or crepitation. No use of accessory muscles of respiration.  CARDIOVASCULAR: S1, S2 normal. No murmurs, rubs, or gallops.  ABDOMEN: Soft, nontender, nondistended. Bowel sounds present. No organomegaly or mass.  EXTREMITIES: No pedal edema, cyanosis, or clubbing. Blood oozing and soaked dressing at temp dialysis cath site NEUROLOGIC: Cranial nerves II through XII are intact. Muscle strength 5/5 in all extremities. Sensation intact. Gait not checked.  PSYCHIATRIC: The patient is alert and oriented x 3.  SKIN: No obvious rash, lesion, or ulcer. Left lower extremity with decreased  erythema and tenderness   LABORATORY PANEL:   CBC  Recent Labs Lab 01/26/15 0658  WBC 3.5*  HGB 7.4*  HCT 22.7*  PLT 50*   ------------------------------------------------------------------------------------------------------------------  Chemistries   Recent Labs Lab 01/22/15 1339 01/24/15 0408  NA 144 144  K 4.6 3.4*  CL 119* 116*  CO2 14* 18*  GLUCOSE 131* 87  BUN 71* 52*  CREATININE 5.44* 4.52*  CALCIUM 8.3* 8.1*  MG 1.4* 1.8  AST 32  --   ALT 26  --   ALKPHOS 143*  --   BILITOT 1.2  --    ------------------------------------------------------------------------------------------------------------------  Cardiac Enzymes No results for input(s): TROPONINI in the last 168 hours. ------------------------------------------------------------------------------------------------------------------  RADIOLOGY:  No results found.  EKG:   Orders placed or performed during the hospital encounter of 09/19/13  . EKG 12-Lead  . EKG 12-Lead  .  EKG    ASSESSMENT AND PLAN:   #1 acute renal failure on CKD3 progress to ESRD ?-  With volume overload with edema and shortness of breath at the time of admission    Had a  temporary dialysis catheter placed in the right femoral vein by vascular surgery 10/12 Dialysis catheter will be packed with citrate instead of heparin in view of thrombocytopenia Patient will get renal biopsy tomorrow if INR is better, will f/u with IR  In am and possible transfusion of platelets and FFP Will monitor I's and O's and daily weights. Npo after MN  #2 left lower extremity cellulitis: Follow-up blood cultures.  Elevate the leg.  Discontinue  Ancef. the patient is started on on vancomycin 01/24/2015 and will continue the same with renal dosing as clinically improving  #3 chronic respiratory failure on 3 L nasal cannula due to COPD due to smoking: Oxygenation is stable at this time.   chest x-ray with no acute abnormalities. Continue home regimen of brovana and Spiriva  #4 anemia: Last hemoglobin recorded at 7.4 Likely anemia of chronic disease.  Repeat CBC in a.m. and will consider transfusion during hemodialysis if hemoglobin is less  7  #5 GERD: protonix  #6 anxiety: continue xanax as she takes this at home.  #7 thrombocytopenia Appreciate oncology recs Vit k sq given May need transfusion of platelets and FFP in am prior to bx Discontinued Lovenox. Discontinued heparin flushes and heparin given during dialysis.  low platelet count at 50,000 Hit panel  CODE STATUS: DNR ,discussed with patient and daughter and sister at bedside on admission.     All the records are reviewed and case discussed with Care Management/Social Workerr. Management plans discussed with the patient, family and they are in agreement.    TOTAL TIME TAKING CARE OF THIS PATIENT: 35 minutes.   POSSIBLE D/C IN ? DAYS, DEPENDING ON CLINICAL CONDITION.   Ramonita Lab M.D on 01/26/2015 at 11:03 PM  Between 7am to 6pm - Pager - [phone] After 6pm go to [email or web address] - password [email or web address] [facility]  [facility]  Office  [phone]  CC: Primary care physician; [name], MD

## 2015-01-26 NOTE — Progress Notes (Signed)
01/26/2015 11:15  At MD's request, changed dressing on HD catheter site to be able to monitor and assess drainage and bleeding.  Covered site with nonadherent gauze and tegaderm.  Will continue to monitor and assess.  Bradly Chris, RN

## 2015-01-26 NOTE — Progress Notes (Signed)
01/26/2015 8:06 AM  Spoke to pt MD Gouru about criticasl lab values.  INR 5.03 and PT 46.4.  MD will continue to monitor pt.  Will speak to nephrologist at MDs suggestion and continue to monitor and assess pt.  Bradly Chris, RN

## 2015-01-26 NOTE — Progress Notes (Signed)
[facility]  Date of admission:  01/22/2015  Inpatient day:  01/26/2015  Consulting physician: Ramonita Lab, MD  Reason for Consultation:  Thrombocytopenia, needs renal biopsy, considering platelet transfusion.  Chief Complaint: Brandi Cline is a 81 y.o. female with COPD, chronic kidney disease, Crohn's disease, and cirrhosis who was admitted acute on chronic renal failure and a left lower extremity cellulitis.  HPI: The patient has a history of chronic renal disease. Review of available labs notes a creatinine of 0.84 on 06/19/2014, 2.02 on 10/03/2014, 1.28 on 10/16/2014, 2.6 on 12/04/2014 and 5.3 on 01/09/2015.  The etiology of her renal insufficiency is unclear. She does have a history of hypertension. SPEP on 12/04/2014 was negative.  Per notes, previous serologic workup was negative.  She has received dialysis x 2 (began with this admission).  The patient has an unclear history of cirrhosis. Review of available imaging studies note an abdominal and pelvic CT scan on 09/20/2013 with cirrhosis and splenic enlargement, splenic varices, and small free fluid.  Abdominal and pelvic CT scan on 10/22/2014 revealed no change in cirrhosis with moderate amount of ascites and mild splenomegaly. She saw a gastroenterologist in [place] regarding her liver in approximately 10/2014. She is unaware of the workup.  Labs on 09/22/2013 revealed hepatitis B IgM negative. Labs on 01/23/2015 reveal hepatitis B surface antigen negative and hepatitis B core antibody total negative with a hepatitis B surface antibody positive (questionable prior immunization).  Patient has a recently become aware of anemia. Hemoglobin has ranged between 9 and 10 in the past year and a half.  She underwent a workup on 12/04/2014 with Dr. Clelia Croft in [place].  Ferritin was 89, 21% iron saturation, TIBC 331, and erythropoietin level 7.6. SPEP was negative.  She was felt to have anemia of chronic renal disease. She  states that she received her first dose of Aranesp approximately 2-3 weeks ago.  The patient has had intermittent thrombocytopenia dating back to 2009. Platelet count initially fluctuated, but were consistently above 100,000 until 09/19/2013. At that time platelets were 72,000. Between 10/03/2014 and 10/16/2014 platelets range between 71,000 and 95,000.  Patient was admitted on 01/10/2015 with acute renal failure.  Platelets were 57,000.  Notes indicate she received Lovenox.  The patient was admitted on 01/22/2015 with a volume overload and left lower extremity cellulitis. She denied any fevers but noted a painful red feverish leg.  He has been on vancomycin.  Symptomatically, the patient denies any B symptoms at. She states that she bleeds easily. Stools are dark. DD and colonoscopy approximate 5 years ago by Dr. Blinda Leatherwood was negative. She states that her diet is fairly good. She eats a lot of chicken but not much red meat. She denies herbal products or quinine water. Her only new medication in the past year is [place], a new inhaler.   Past Medical History  Diagnosis Date  . Dyspnea   . Esophageal reflux   . CAD (coronary artery disease)   . Hypertension   . Crohn's disease (HCC)   . Allergic rhinitis   . COPD (chronic obstructive pulmonary disease) (HCC)   . History of kidney stones   . History of shingles   . Trigeminal neuralgia   . Family history of anesthesia complication     " SISTER HAD BAD FEELINGS "  . Anginal pain (HCC)   . Anxiety     ' JUST FOR MY BREATHING "  . Arthritis     mild in hands  .  Hyperlipidemia   . CKD (chronic kidney disease)     Past Surgical History  Procedure Laterality Date  . Coronary stents    . Lithotripsy    . Ureteral stent placement    . Cholecystectomy    . Total abdominal hysterectomy    . Partial resections large and small bowel for crohn's    . Breast lumps benign    . Left heart catheterization with coronary angiogram N/A  10/28/2011    Procedure: LEFT HEART CATHETERIZATION WITH CORONARY ANGIOGRAM;  Surgeon: Lesleigh Noe, MD;  Location: [facility] CATH LAB;  Service: Cardiovascular;  Laterality: N/A;    Family History  Problem Relation Age of Onset  . Breast cancer Sister   . COPD Sister   . Stroke Mother   . Prostate cancer Father   . Prostate cancer Brother   . Heart attack Brother   No family history of any blood disorder or connective tissue disease.  Social History:  reports that she quit smoking about 24 years ago. Her smoking use included Cigarettes. She has a 30 pack-year smoking history. She has never used smokeless tobacco. She reports that she does not drink alcohol or use illicit drugs.  The patient is alone today.  Allergies:  Allergies  Allergen Reactions  . Avelox [Moxifloxacin Hcl In Nacl] Shortness Of Breath, Swelling and Rash  . Codeine Shortness Of Breath and Other (See Comments)    Couldn't breath  . Theophyllines     Slight increased heart rate  . Ciprofloxacin Other (See Comments)    nervous  . Milk-Related Compounds Other (See Comments)    Whole milk hurts stomach. Not all dairy products do this.  . Tape     Please use paper tape only  . Lisinopril Rash    Medications Prior to Admission  Medication Sig Dispense Refill  . albuterol (PROVENTIL HFA;VENTOLIN HFA) 108 (90 BASE) MCG/ACT inhaler Inhale 2 puffs into the lungs every 6 (six) hours as needed for wheezing or shortness of breath. 1 Inhaler 11  . albuterol (PROVENTIL) (2.5 MG/3ML) 0.083% nebulizer solution Take 3 mLs (2.5 mg total) by nebulization every 4 (four) hours as needed. Shortness of breath 75 mL 11  . ALPRAZolam (XANAX) 0.5 MG tablet TAKE ONE TABLET BY MOUTH THREE TIMES DAILY AS NEEDED FOR ANXIETY 90 tablet 0  . arformoterol (BROVANA) 15 MCG/2ML NEBU Take 2 mLs (15 mcg total) by nebulization 2 (two) times daily. 60 mL prn  . BIOTIN 5000 PO Take 2 tablets by mouth daily.    . Cholecalciferol (VITAMIN D-3) 5000 UNITS  TABS Take 1 tablet by mouth daily.    . isosorbide mononitrate (IMDUR) 30 MG 24 hr tablet TAKE TWO TABLETS BY MOUTH DAILY 60 tablet 11  . loratadine (CLARITIN) 10 MG tablet Take 10 mg by mouth daily as needed.     . magnesium oxide (MAG-OX) 400 MG tablet Take 400 mg by mouth daily.     . Multiple Vitamin (MULTIVITAMIN) tablet Take 1 tablet by mouth daily.     . nitroGLYCERIN (NITROSTAT) 0.4 MG SL tablet Place 1 tablet (0.4 mg total) under the tongue every 5 (five) minutes as needed. Chest pain 25 tablet 3  . omeprazole (PRILOSEC) 20 MG capsule TAKE ONE CAPSULE BY MOUTH DAILY 90 capsule 3  . Zinc Acetate, Oral, (ZINC ACETATE PO) Take 1 tablet by mouth daily.      Review of Systems: GENERAL:  Fatigue.  No fevers, sweats or weight loss. PERFORMANCE STATUS (ECOG):  2 HEENT:  Heard of hearing.  No visual changes, runny nose, sore throat, mouth sores or tenderness. Lungs: No shortness of breath or cough.  No hemoptysis. Cardiac:  No chest pain, palpitations, orthopnea, or PND. GI:  No nausea, vomiting, diarrhea, constipation, melena or hematochezia. GU:  No urgency, frequency, dysuria, or hematuria.  No vaginal bleeding. Musculoskeletal:  No back pain.  No joint pain.  No muscle tenderness. Extremities:  Pain and swelling associated with left leg cellulitis, improved.  Bilateral lower extremity edema. Skin:  Cellulitis left lower extremity, improving.  Easy bruising.  No rashes. Neuro:  No headache, numbness or weakness, balance or coordination issues. Endocrine:  No diabetes, thyroid issues, hot flashes or night sweats. Psych:  No mood changes, depression or anxiety. Pain:  No focal pain. Review of systems:  All other systems reviewed and found to be negative.  Physical Exam:  Blood pressure 128/60, pulse 81, temperature 98.6 F (37 C), temperature source Oral, resp. rate 20, height 5\' 4"  (1.626 m), weight 142 lb 11.2 oz (64.728 kg), SpO2 94 %.  GENERAL:  Frail appearing elderly woman  sitting comfortably on the medical unit in no acute distress. MENTAL STATUS:  Alert and oriented to person, place and time. HEAD:  Styled short graying hair.  Normocephalic, atraumatic, face symmetric, no Cushingoid features. EYES:  Blue eyes.  Pupils equal round and reactive to light and accomodation.  No conjunctivitis or scleral icterus. ENT:  [place] in place.  Dry mouth.  Oropharynx clear without lesion.  Tongue normal. Mucous membranes moist.  RESPIRATORY:  Decreased respiratory excursion.  Clear to auscultation without rales, wheezes or rhonchi. CARDIOVASCULAR:  Regular rate and rhythm without murmur, rub or gallop. ABDOMEN:  Multiple well healed abdominal incisions s/p small and large bowel resections for Crohn's disease.  Soft, non-tender, with active bowel sounds, and no appreciable hepatosplenomegaly.  No masses. SKIN:  Ecchymosis upper extremities.  Left lower extremity with resolving cellulitis, no increased warmth. EXTREMITIES: Bilateral lower extremity 2+ edema.  No palpable cords. LYMPH NODES: No palpable cervical, supraclavicular, axillary or inguinal adenopathy  NEUROLOGICAL: Unremarkable. PSYCH:  Appropriate.  Results for orders placed or performed during the hospital encounter of 01/22/15 (from the past 48 hour(s))  CBC     Status: Abnormal   Collection Time: 01/25/15  6:22 AM  Result Value Ref Range   WBC 4.1 3.6 - 11.0 K/uL   RBC 2.51 (L) 3.80 - 5.20 MIL/uL   Hemoglobin 7.3 (L) 12.0 - 16.0 g/dL   HCT 16.1 (L) 09.6 - 04.5 %   MCV 89.5 80.0 - 100.0 fL   MCH 28.9 26.0 - 34.0 pg   MCHC 32.3 32.0 - 36.0 g/dL   RDW 40.9 (H) 81.1 - 91.4 %   Platelets 46 (L) 150 - 440 K/uL  Reticulocytes     Status: Abnormal   Collection Time: 01/25/15 10:31 PM  Result Value Ref Range   Retic Ct Pct 2.0 0.4 - 3.1 %   RBC. 2.65 (L) 3.80 - 5.20 MIL/uL   Retic Count, Manual 53.0 19.0 - 183.0 K/uL  Protime-INR     Status: Abnormal   Collection Time: 01/26/15  6:58 AM  Result Value Ref Range    Prothrombin Time 46.4 (H) 11.4 - 15.0 seconds   INR 5.03 (HH)     Comment: RESULT REPEATED AND VERIFIED CRITICAL RESULT CALLED TO, READ BACK BY AND VERIFIED WITH: JACK DOUGHERTY ON 01/26/15 AT 0749 BY QSD   APTT     Status:  Abnormal   Collection Time: 01/26/15  6:58 AM  Result Value Ref Range   aPTT 47 (H) 24 - 36 seconds    Comment:        IF BASELINE aPTT IS ELEVATED, SUGGEST PATIENT RISK ASSESSMENT BE USED TO DETERMINE APPROPRIATE ANTICOAGULANT THERAPY.   CBC with Differential     Status: Abnormal   Collection Time: 01/26/15  6:58 AM  Result Value Ref Range   WBC 3.5 (L) 3.6 - 11.0 K/uL   RBC 2.53 (L) 3.80 - 5.20 MIL/uL   Hemoglobin 7.4 (L) 12.0 - 16.0 g/dL   HCT 16.1 (L) 09.6 - 04.5 %   MCV 89.8 80.0 - 100.0 fL   MCH 29.1 26.0 - 34.0 pg   MCHC 32.4 32.0 - 36.0 g/dL   RDW 40.9 (H) 81.1 - 91.4 %   Platelets 50 (L) 150 - 440 K/uL   Neutrophils Relative % 70% %   Neutro Abs 2.5 1.4 - 6.5 K/uL   Lymphocytes Relative 17% %   Lymphs Abs 0.6 (L) 1.0 - 3.6 K/uL   Monocytes Relative 11% %   Monocytes Absolute 0.4 0.2 - 0.9 K/uL   Eosinophils Relative 2% %   Eosinophils Absolute 0.1 0 - 0.7 K/uL   Basophils Relative 0% %   Basophils Absolute 0.0 0 - 0.1 K/uL  Folate     Status: None   Collection Time: 01/26/15  6:58 AM  Result Value Ref Range   Folate 32.0 >5.9 ng/mL  TSH     Status: None   Collection Time: 01/26/15  6:58 AM  Result Value Ref Range   TSH 3.623 0.350 - 4.500 uIU/mL  Type and screen [facility]     Status: None   Collection Time: 01/26/15  7:05 AM  Result Value Ref Range   ABO/RH(D) A POS    Antibody Screen NEG    Sample Expiration 01/29/2015   DAT, polyspecific, AHG ([facility] only)     Status: None   Collection Time: 01/26/15  7:05 AM  Result Value Ref Range   Polyspecific AHG test NEG    No results found.  Assessment:  The patient is a 80 y.o. woman with chronic kidney disease, Crohn's disease, and cirrhosis who was admitted acute  on chronic renal failure and a left lower extremity cellulitis.  Etiology of renal insufficiency is unclear.    She has anemia of chronic kidney disease.  Iron studies in 11/2014 were normal.  Diet is good.  EGD and colonoscopy in 2011 were negative per patient report.  She has has had a fluctuating platelet count since 2009. Platelet count was consistently above 100,000 until 09/19/2013. At that time platelets were 72,000. Between 10/03/2014 and 10/16/2014 platelets range between 71,000 and 95,000.  With this admission, platelet count has decreased further (today 50,000).  Peripheral smear reveals no schistocytes.  No evidence of TTP.  Suspect etiology is multifactorial and likely due to underlying liver disease, poor marrow reserve, possibly some component of ITP (patient has Crohn's disease), and consumption due to infection.  With patient's prior history of exposure to Lovenox and dialysis with heparin, she may have some component of HIT (doubt).  Platelet count in a blue top (citrate tube) today ruled out pseudothrombocytopenia.  PT/INR is elevated (46.4/5.03).  Etiology unclear.  No evidence of DIC.   Plan:   1.  Labs ordered:  CBC in blue top, PT, PTT, B12, folate, TSH, retic, DAT, HIT assay, serotonin release assay. 2.  Collect 24 hour urine for UPEP and free light chains (small urine output per patient). 3.  Follow-up pending:  SPEP, free light chains, ANA, hepatitis C antibody. 4.  No heparin. 5.  Consider oral vitamin K as patient not bleeding.  Will likely need FFP for kidney biopsy. 6.  Would check 1 hour post platelet count following platelet transfusion to ensure patient has adequate rise prior to biopsy.   Thank you for allowing me to participate in [name] 's care.  I will follow her closely with you while hospitalized and after discharge in the outpatient department.  Rosey Bath, MD  01/26/2015, 11:00 AM

## 2015-01-27 ENCOUNTER — Telehealth: Payer: Self-pay | Admitting: Oncology

## 2015-01-27 ENCOUNTER — Telehealth: Payer: Self-pay | Admitting: *Deleted

## 2015-01-27 DIAGNOSIS — I10 Essential (primary) hypertension: Secondary | ICD-10-CM

## 2015-01-27 LAB — PROTEIN ELECTROPHORESIS, SERUM
A/G RATIO SPE: 1.3 (ref 0.7–1.7)
ALBUMIN ELP: 3.1 g/dL (ref 2.9–4.4)
ALPHA-1-GLOBULIN: 0.3 g/dL (ref 0.0–0.4)
ALPHA-2-GLOBULIN: 0.5 g/dL (ref 0.4–1.0)
BETA GLOBULIN: 0.8 g/dL (ref 0.7–1.3)
GLOBULIN, TOTAL: 2.3 g/dL (ref 2.2–3.9)
Gamma Globulin: 0.7 g/dL (ref 0.4–1.8)
Total Protein ELP: 5.4 g/dL — ABNORMAL LOW (ref 6.0–8.5)

## 2015-01-27 LAB — BASIC METABOLIC PANEL
ANION GAP: 10 (ref 5–15)
BUN: 26 mg/dL — ABNORMAL HIGH (ref 6–20)
CALCIUM: 8.9 mg/dL (ref 8.9–10.3)
CO2: 28 mmol/L (ref 22–32)
CREATININE: 3.59 mg/dL — AB (ref 0.44–1.00)
Chloride: 104 mmol/L (ref 101–111)
GFR, EST AFRICAN AMERICAN: 13 mL/min — AB (ref >60)
GFR, EST NON AFRICAN AMERICAN: 11 mL/min — AB (ref >60)
GLUCOSE: 101 mg/dL — AB (ref 65–99)
Potassium: 4.1 mmol/L (ref 3.5–5.1)
SODIUM: 142 mmol/L (ref 135–145)

## 2015-01-27 LAB — COMPREHENSIVE METABOLIC PANEL
ALBUMIN: 2.7 g/dL — AB (ref 3.5–5.0)
ALT: 16 U/L (ref 14–54)
AST: 53 U/L — AB (ref 15–41)
Alkaline Phosphatase: 161 U/L — ABNORMAL HIGH (ref 38–126)
Anion gap: 9 (ref 5–15)
BUN: 15 mg/dL (ref 6–20)
CHLORIDE: 102 mmol/L (ref 101–111)
CO2: 29 mmol/L (ref 22–32)
CREATININE: 2.38 mg/dL — AB (ref 0.44–1.00)
Calcium: 8.1 mg/dL — ABNORMAL LOW (ref 8.9–10.3)
GFR calc Af Amer: 21 mL/min — ABNORMAL LOW (ref >60)
GFR, EST NON AFRICAN AMERICAN: 18 mL/min — AB (ref >60)
GLUCOSE: 144 mg/dL — AB (ref 65–99)
POTASSIUM: 3.8 mmol/L (ref 3.5–5.1)
Sodium: 140 mmol/L (ref 135–145)
Total Bilirubin: 2.3 mg/dL — ABNORMAL HIGH (ref 0.3–1.2)
Total Protein: 5.5 g/dL — ABNORMAL LOW (ref 6.5–8.1)

## 2015-01-27 LAB — CBC
HCT: 25.4 % — ABNORMAL LOW (ref 35.0–47.0)
HEMOGLOBIN: 8.1 g/dL — AB (ref 12.0–16.0)
MCH: 29 pg (ref 26.0–34.0)
MCHC: 31.8 g/dL — ABNORMAL LOW (ref 32.0–36.0)
MCV: 91 fL (ref 80.0–100.0)
PLATELETS: 69 10*3/uL — AB (ref 150–440)
RBC: 2.79 MIL/uL — AB (ref 3.80–5.20)
RDW: 15.1 % — ABNORMAL HIGH (ref 11.5–14.5)
WBC: 5.5 10*3/uL (ref 3.6–11.0)

## 2015-01-27 LAB — PROTIME-INR
INR: 1.89
PROTHROMBIN TIME: 21.9 s — AB (ref 11.4–15.0)

## 2015-01-27 LAB — ANTINUCLEAR ANTIBODIES, IFA: ANA Ab, IFA: NEGATIVE

## 2015-01-27 LAB — PT FACTOR INHIBITOR (MIXING STUDY)
1 HR INCUB PT 1:1NP: 16.2 s — ABNORMAL HIGH (ref 11.9–14.1)
PT 1:1NP: 14.9 s — ABNORMAL HIGH (ref 11.9–14.1)
PT: 50.7 s — ABNORMAL HIGH (ref 11.9–14.1)

## 2015-01-27 LAB — MPO/PR-3 (ANCA) ANTIBODIES

## 2015-01-27 LAB — C3 COMPLEMENT: C3 COMPLEMENT: 90 mg/dL (ref 82–167)

## 2015-01-27 LAB — C4 COMPLEMENT: COMPLEMENT C4, BODY FLUID: 18 mg/dL (ref 14–44)

## 2015-01-27 MED ORDER — ANTICOAGULANT SODIUM CITRATE 4% (200MG/5ML) IV SOLN
5.0000 mL | Status: DC | PRN
  Administered 2015-01-27: 5 mL via INTRAVENOUS_CENTRAL
  Filled 2015-01-27 (×6): qty 250

## 2015-01-27 MED ORDER — SODIUM CHLORIDE 0.9 % IV SOLN
Freq: Once | INTRAVENOUS | Status: AC
  Administered 2015-01-27: 23:00:00 via INTRAVENOUS

## 2015-01-27 NOTE — Telephone Encounter (Signed)
Patient dtr called to cx 10/16 lab/inj due to patient inpt at [facility]. Per dtr she will call back to r/s if needed.

## 2015-01-27 NOTE — Progress Notes (Signed)
Post hd tx 

## 2015-01-27 NOTE — Progress Notes (Signed)
[facility] [place] [facility]   PATIENT NAME: Brandi Cline    MR#:  [id number]  DATE OF BIRTH:  1930/06/30  SUBJECTIVE:  CHIEF COMPLAINT:  Patients oozing of blood from temp dialysis cath site is improved. Feeling weak and tired. Scheduled for dialysis today    REVIEW OF SYSTEMS:  CONSTITUTIONAL: No fever, fatigue or weakness.  EYES: No blurred or double vision.  EARS, NOSE, AND THROAT: No tinnitus or ear pain.  RESPIRATORY: No cough, shortness of breath, wheezing or hemoptysis.  CARDIOVASCULAR: No chest pain, orthopnea, edema.  GASTROINTESTINAL: No nausea, vomiting, diarrhea or abdominal pain.  GENITOURINARY: No dysuria, hematuria.  ENDOCRINE: No polyuria, nocturia,  HEMATOLOGY: No anemia, reports bruising . SKIN: No rash or lesion. Left leg  redness is slightly better MUSCULOSKELETAL: No joint pain or arthritis.   NEUROLOGIC: No tingling, numbness, weakness.  PSYCHIATRY: No anxiety or depression.   DRUG ALLERGIES:   Allergies  Allergen Reactions  . Avelox [Moxifloxacin Hcl In Nacl] Shortness Of Breath, Swelling and Rash  . Codeine Shortness Of Breath and Other (See Comments)    Couldn't breath  . Theophyllines     Slight increased heart rate  . Ciprofloxacin Other (See Comments)    nervous  . Milk-Related Compounds Other (See Comments)    Whole milk hurts stomach. Not all dairy products do this.  . Tape     Please use paper tape only  . Lisinopril Rash    VITALS:  Blood pressure 141/39, pulse 84, temperature 98.5 F (36.9 C), temperature source Oral, resp. rate 18, height 5\' 4"  (1.626 m), weight 65.6 kg (144 lb 10 oz), SpO2 97 %.  PHYSICAL EXAMINATION:  GENERAL:  81 y.o.-year-old patient lying in the bed with no acute distress.  EYES: Pupils equal, round, reactive to light and accommodation. No scleral icterus. Extraocular muscles intact.  HEENT: Head atraumatic, normocephalic. Oropharynx and nasopharynx clear.  NECK:  Supple, no  jugular venous distention. No thyroid enlargement, no tenderness.  LUNGS: Normal breath sounds bilaterally, no wheezing, rales,rhonchi or crepitation. No use of accessory muscles of respiration.  CARDIOVASCULAR: S1, S2 normal. No murmurs, rubs, or gallops.  ABDOMEN: Soft, nontender, nondistended. Bowel sounds present. No organomegaly or mass.  EXTREMITIES: No pedal edema, cyanosis, or clubbing. Temp dialysis catheter site is intact, no oozing noticed  NEUROLOGIC: Cranial nerves II through XII are intact. Muscle strength 5/5 in all extremities. Sensation intact. Gait not checked.  PSYCHIATRIC: The patient is alert and oriented x 3.  SKIN: No obvious rash, lesion, or ulcer. Left lower extremity with decreased  erythema and tenderness   LABORATORY PANEL:   CBC  Recent Labs Lab 01/27/15 0645  WBC 5.5  HGB 8.1*  HCT 25.4*  PLT 69*   ------------------------------------------------------------------------------------------------------------------  Chemistries   Recent Labs Lab 01/22/15 1339 01/24/15 0408 01/27/15 0645  NA 144 144 142  K 4.6 3.4* 4.1  CL 119* 116* 104  CO2 14* 18* 28  GLUCOSE 131* 87 101*  BUN 71* 52* 26*  CREATININE 5.44* 4.52* 3.59*  CALCIUM 8.3* 8.1* 8.9  MG 1.4* 1.8  --   AST 32  --   --   ALT 26  --   --   ALKPHOS 143*  --   --   BILITOT 1.2  --   --    ------------------------------------------------------------------------------------------------------------------  Cardiac Enzymes No results for input(s): TROPONINI in the last 168 hours. ------------------------------------------------------------------------------------------------------------------  RADIOLOGY:  No results found.  EKG:   Orders placed  or performed during the hospital encounter of 09/19/13  . EKG 12-Lead  . EKG 12-Lead  . EKG    ASSESSMENT AND PLAN:   #1 acute renal failure on CKD3 progress to ESRD ?-  With volume overload with edema and shortness of breath at the time  of admission    Had a temporary dialysis catheter placed in the right femoral vein by vascular surgery 10/12 Dialysis catheter will be packed with citrate instead of heparin in view of thrombocytopenia Patient will get renal biopsy tomorrow if INR is better, will f/u with IR  In am and possible transfusion of platelets and FFP. As per my d/w dr.Green , radiologist pt might not need platelets if platelet count is > 60,000. Needs goal INR <1.5 Will monitor I's and O's and daily weights. Npo after MN  #2 left lower extremity cellulitis:  Elevate the leg.  Discontinue  Ancef. the patient is started on on vancomycin 01/24/2015 and will continue the same with renal dosing 2 more doses as clinically improving  #3 chronic respiratory failure on 3 L nasal cannula due to COPD due to smoking: Oxygenation is stable at this time.   chest x-ray with no acute abnormalities. Continue home regimen of brovana and Spiriva  #4 anemia: Last hemoglobin recorded at 8.1 Likely anemia of chronic disease.  Repeat CBC in a.m. and will consider transfusion during hemodialysis if hemoglobin is less  7  #5 GERD: protonix  #6 anxiety: continue xanax as she takes this at home.  #7 thrombocytopenia Appreciate oncology recs Vit k sq given once 10/16 for coagulopathy May need transfusion of platelets and FFP in am prior to bx Discontinued Lovenox. Discontinued heparin flushes and heparin given during dialysis.  low platelet count at 69,000 Hit panel  CODE STATUS: DNR ,discussed with patient and daughter and sister at bedside on admission.     All the records are reviewed and case discussed with Care Management/Social Workerr. Management plans discussed with the patient, family and they are in agreement.    TOTAL TIME TAKING CARE OF THIS PATIENT: 35 minutes.   POSSIBLE D/C IN ? DAYS, DEPENDING ON CLINICAL CONDITION.   Ramonita Lab M.D on 01/27/2015 at 8:55 PM  Between 7am to 6pm - Pager -  [phone] After 6pm go to [email or web address] - password [email or web address]  [facility]  Office  [phone]  CC: Primary care physician; [name], MD

## 2015-01-27 NOTE — Progress Notes (Signed)
[facility]  Date of admission:  01/22/2015  Inpatient day:  01/27/2015  Chief Complaint: Brandi Cline is a 81 y.o. female with COPD, chronic kidney disease, Crohn's disease, and cirrhosis who was admitted acute on chronic renal failure and a left lower extremity cellulitis.  Subjective:  Patient denies any complaint.  Notes kidney biopsy postponed until tomorrow.  No bruising or bleeding.  Past Medical History  Diagnosis Date  . Dyspnea   . Esophageal reflux   . CAD (coronary artery disease)   . Hypertension   . Crohn's disease ([place])   . Allergic rhinitis   . COPD (chronic obstructive pulmonary disease) ([place])   . History of kidney stones   . History of shingles   . Trigeminal neuralgia   . Family history of anesthesia complication     " SISTER HAD BAD FEELINGS "  . Anginal pain ([place])   . Anxiety     ' JUST FOR MY BREATHING "  . Arthritis     mild in hands  . Hyperlipidemia   . CKD (chronic kidney disease)     Past Surgical History  Procedure Laterality Date  . Coronary stents    . Lithotripsy    . Ureteral stent placement    . Cholecystectomy    . Total abdominal hysterectomy    . Partial resections large and small bowel for crohn's    . Breast lumps benign    . Left heart catheterization with coronary angiogram N/A 10/28/2011    Procedure: LEFT HEART CATHETERIZATION WITH CORONARY ANGIOGRAM;  Surgeon: Sinclair Grooms, MD;  Location: [facility] CATH LAB;  Service: Cardiovascular;  Laterality: N/A;    Family History  Problem Relation Age of Onset  . Breast cancer Sister   . COPD Sister   . Stroke Mother   . Prostate cancer Father   . Prostate cancer Brother   . Heart attack Brother   No family history of any blood disorder or connective tissue disease.  Social History:  reports that she quit smoking about 24 years ago. Her smoking use included Cigarettes. She has a 30 pack-year smoking history. She has never used smokeless tobacco. She reports  that she does not drink alcohol or use illicit drugs.  The patient is alone today.  Allergies:  Allergies  Allergen Reactions  . Avelox [Moxifloxacin Hcl In Nacl] Shortness Of Breath, Swelling and Rash  . Codeine Shortness Of Breath and Other (See Comments)    Couldn't breath  . Theophyllines     Slight increased heart rate  . Ciprofloxacin Other (See Comments)    nervous  . Milk-Related Compounds Other (See Comments)    Whole milk hurts stomach. Not all dairy products do this.  . Tape     Please use paper tape only  . Lisinopril Rash    Medications Prior to Admission  Medication Sig Dispense Refill  . albuterol (PROVENTIL HFA;VENTOLIN HFA) 108 (90 BASE) MCG/ACT inhaler Inhale 2 puffs into the lungs every 6 (six) hours as needed for wheezing or shortness of breath. 1 Inhaler 11  . albuterol (PROVENTIL) (2.5 MG/3ML) 0.083% nebulizer solution Take 3 mLs (2.5 mg total) by nebulization every 4 (four) hours as needed. Shortness of breath 75 mL 11  . ALPRAZolam (XANAX) 0.5 MG tablet TAKE ONE TABLET BY MOUTH THREE TIMES DAILY AS NEEDED FOR ANXIETY 90 tablet 0  . arformoterol (BROVANA) 15 MCG/2ML NEBU Take 2 mLs (15 mcg total) by nebulization 2 (two) times daily.  60 mL prn  . BIOTIN 5000 PO Take 2 tablets by mouth daily.    . Cholecalciferol (VITAMIN D-3) 5000 UNITS TABS Take 1 tablet by mouth daily.    . isosorbide mononitrate (IMDUR) 30 MG 24 hr tablet TAKE TWO TABLETS BY MOUTH DAILY 60 tablet 11  . loratadine (CLARITIN) 10 MG tablet Take 10 mg by mouth daily as needed.     . magnesium oxide (MAG-OX) 400 MG tablet Take 400 mg by mouth daily.     . Multiple Vitamin (MULTIVITAMIN) tablet Take 1 tablet by mouth daily.     . nitroGLYCERIN (NITROSTAT) 0.4 MG SL tablet Place 1 tablet (0.4 mg total) under the tongue every 5 (five) minutes as needed. Chest pain 25 tablet 3  . omeprazole (PRILOSEC) 20 MG capsule TAKE ONE CAPSULE BY MOUTH DAILY 90 capsule 3  . Zinc Acetate, Oral, (ZINC ACETATE PO)  Take 1 tablet by mouth daily.      Review of Systems: GENERAL:  Fatigue.  No fevers, sweats or weight loss. PERFORMANCE STATUS (ECOG):  2 HEENT:  Heard of hearing.  No visual changes, runny nose, sore throat, mouth sores or tenderness. Lungs: No shortness of breath or cough.  No hemoptysis. Cardiac:  No chest pain, palpitations, orthopnea, or PND. GI:  No nausea, vomiting, diarrhea, constipation, melena or hematochezia. GU:  No urgency, frequency, dysuria, or hematuria.  No vaginal bleeding. Musculoskeletal:  No back pain.  No joint pain.  No muscle tenderness. Extremities:  Pain and swelling associated with left leg cellulitis, improved.  Bilateral lower extremity edema. Skin:  Cellulitis left lower extremity, continues to improve.  Easy bruising (chronic).  No rashes. Neuro:  No headache, numbness or weakness, balance or coordination issues. Endocrine:  No diabetes, thyroid issues, hot flashes or night sweats. Psych:  No mood changes, depression or anxiety. Pain:  No focal pain. Review of systems:  All other systems reviewed and found to be negative.  Physical Exam:  Blood pressure 125/43, pulse 72, temperature 97.9 F (36.6 C), temperature source Oral, resp. rate 20, height 5' 4"  (1.626 m), weight 144 lb 10 oz (65.6 kg), SpO2 97 %.  GENERAL:  Frail appearing elderly woman sitting comfortably on the medical unit in no acute distress. MENTAL STATUS:  Alert and oriented to person, place and time. HEAD:  Styled short graying hair.  Normocephalic, atraumatic, face symmetric, no Cushingoid features. EYES:  Blue eyes.  Pupils equal round and reactive to light and accomodation.  No conjunctivitis or scleral icterus. ENT:  [place] in place.  Dry mouth.  Oropharynx clear without lesion.  Tongue normal. Mucous membranes moist.  RESPIRATORY:  Decreased respiratory excursion.  Clear to auscultation without rales, wheezes or rhonchi. CARDIOVASCULAR:  Regular rate and rhythm without murmur, rub or  gallop. ABDOMEN:  Multiple well healed abdominal incisions.  Soft, non-tender, with active bowel sounds, and no appreciable hepatosplenomegaly.  No masses. SKIN:  Ecchymosis upper extremities.  Left lower extremity with resolving cellulitis, no increased warmth. EXTREMITIES: Bilateral lower extremity 2+ edema.  No palpable cords. LYMPH NODES: No palpable cervical, supraclavicular, axillary or inguinal adenopathy  NEUROLOGICAL: Unremarkable. PSYCH:  Appropriate.  Results for orders placed or performed during the hospital encounter of 01/22/15 (from the past 48 hour(s))  Reticulocytes     Status: Abnormal   Collection Time: 01/25/15 10:31 PM  Result Value Ref Range   Retic Ct Pct 2.0 0.4 - 3.1 %   RBC. 2.65 (L) 3.80 - 5.20 MIL/uL   Retic Count,  Manual 53.0 19.0 - 183.0 K/uL  Protime-INR     Status: Abnormal   Collection Time: 01/26/15  6:58 AM  Result Value Ref Range   Prothrombin Time 46.4 (H) 11.4 - 15.0 seconds   INR 5.03 (HH)     Comment: RESULT REPEATED AND VERIFIED CRITICAL RESULT CALLED TO, READ BACK BY AND VERIFIED WITH: JACK DOUGHERTY ON 01/26/15 AT 0749 BY QSD   APTT     Status: Abnormal   Collection Time: 01/26/15  6:58 AM  Result Value Ref Range   aPTT 47 (H) 24 - 36 seconds    Comment:        IF BASELINE aPTT IS ELEVATED, SUGGEST PATIENT RISK ASSESSMENT BE USED TO DETERMINE APPROPRIATE ANTICOAGULANT THERAPY.   CBC with Differential     Status: Abnormal   Collection Time: 01/26/15  6:58 AM  Result Value Ref Range   WBC 3.5 (L) 3.6 - 11.0 K/uL   RBC 2.53 (L) 3.80 - 5.20 MIL/uL   Hemoglobin 7.4 (L) 12.0 - 16.0 g/dL   HCT 22.7 (L) 35.0 - 47.0 %   MCV 89.8 80.0 - 100.0 fL   MCH 29.1 26.0 - 34.0 pg   MCHC 32.4 32.0 - 36.0 g/dL   RDW 15.4 (H) 11.5 - 14.5 %   Platelets 50 (L) 150 - 440 K/uL   Neutrophils Relative % 70% %   Neutro Abs 2.5 1.4 - 6.5 K/uL   Lymphocytes Relative 17% %   Lymphs Abs 0.6 (L) 1.0 - 3.6 K/uL   Monocytes Relative 11% %   Monocytes Absolute  0.4 0.2 - 0.9 K/uL   Eosinophils Relative 2% %   Eosinophils Absolute 0.1 0 - 0.7 K/uL   Basophils Relative 0% %   Basophils Absolute 0.0 0 - 0.1 K/uL  Folate     Status: None   Collection Time: 01/26/15  6:58 AM  Result Value Ref Range   Folate 32.0 >5.9 ng/mL  TSH     Status: None   Collection Time: 01/26/15  6:58 AM  Result Value Ref Range   TSH 3.623 0.350 - 4.500 uIU/mL  Vitamin B12     Status: None   Collection Time: 01/26/15  7:05 AM  Result Value Ref Range   Vitamin B-12 665 180 - 914 pg/mL    Comment: (NOTE) This assay is not validated for testing neonatal or myeloproliferative syndrome specimens for Vitamin B12 levels. Performed at [facility]   Type and screen [place]     Status: None   Collection Time: 01/26/15  7:05 AM  Result Value Ref Range   ABO/RH(D) A POS    Antibody Screen NEG    Sample Expiration 01/29/2015   DAT, polyspecific, AHG ([facility] only)     Status: None   Collection Time: 01/26/15  7:05 AM  Result Value Ref Range   Polyspecific AHG test NEG   ABO/Rh     Status: None   Collection Time: 01/26/15  7:07 AM  Result Value Ref Range   ABO/RH(D) A POS   Prepare fresh frozen plasma     Status: None (Preliminary result)   Collection Time: 01/26/15 12:45 PM  Result Value Ref Range   Unit Number [id number]    Blood Component Type THW PLS APHR    Unit division 00    Status of Unit ALLOCATED    Transfusion Status OK TO TRANSFUSE   Fibrinogen     Status: None   Collection Time: 01/26/15  2:49 PM  Result Value Ref Range   Fibrinogen 286 210 - 470 mg/dL  Pt factor inhibitor (mixing study)     Status: Abnormal   Collection Time: 01/26/15  2:49 PM  Result Value Ref Range   PT 50.7 (H) 11.9 - 14.1 sec    Comment: (NOTE) **Effective February 03, 2015 PT reference interval**  will be changing to:                            0 -  3 days    10.7 - 15.6                       4 days -  6 months  10.4 - 13.9                      7 months - 17 years   10.1 - 12.7                               >17 years    9.6 - 11.5    PT 1:1NP 14.9 (H) 11.9 - 14.1 sec    Comment: (NOTE) **Effective February 03, 2015 PT 1:1NP reference**  interval will be changing to:                            0 -  3 days    10.7 - 15.6                       4 days -  6 months  10.4 - 13.9                     7 months - 17 years   10.1 - 12.7                               >17 years    9.6 - 11.5    1 HR INCUB PT 1:1NP 16.2 (H) 11.9 - 14.1 sec    Comment: (NOTE) **Effective February 03, 2015 1 Hr Incub PT 1:1NP**  reference interval will be changing to:                            0 -  3 days    10.7 - 15.6                       4 days -  6 months  10.4 - 13.9                     7 months - 17 years   10.1 - 12.7                               >17 years    9.6 - 11.5 Performed At: [facility] [place], [place] [id number] Lindon Romp MD [id number]   CBC     Status: Abnormal   Collection Time: 01/27/15  6:45 AM  Result Value Ref Range   WBC 5.5 3.6 - 11.0 K/uL   RBC 2.79 (L) 3.80 - 5.20 MIL/uL  Hemoglobin 8.1 (L) 12.0 - 16.0 g/dL   HCT 25.4 (L) 35.0 - 47.0 %   MCV 91.0 80.0 - 100.0 fL   MCH 29.0 26.0 - 34.0 pg   MCHC 31.8 (L) 32.0 - 36.0 g/dL   RDW 15.1 (H) 11.5 - 14.5 %   Platelets 69 (L) 150 - 440 K/uL  Protime-INR     Status: Abnormal   Collection Time: 01/27/15  6:45 AM  Result Value Ref Range   Prothrombin Time 21.9 (H) 11.4 - 15.0 seconds   INR 8.11   Basic metabolic panel     Status: Abnormal   Collection Time: 01/27/15  6:45 AM  Result Value Ref Range   Sodium 142 135 - 145 mmol/L   Potassium 4.1 3.5 - 5.1 mmol/L   Chloride 104 101 - 111 mmol/L   CO2 28 22 - 32 mmol/L   Glucose, Bld 101 (H) 65 - 99 mg/dL   BUN 26 (H) 6 - 20 mg/dL   Creatinine, Ser 3.59 (H) 0.44 - 1.00 mg/dL   Calcium 8.9 8.9 - 10.3 mg/dL   GFR calc non Af Amer 11 (L) >60 mL/min   GFR calc Af Amer 13 (L) >60 mL/min     Comment: (NOTE) The eGFR has been calculated using the CKD EPI equation. This calculation has not been validated in all clinical situations. eGFR's persistently <60 mL/min signify possible Chronic Kidney Disease.    Anion gap 10 5 - 15   No results found.  Assessment:  The patient is a 81 y.o. woman with chronic kidney disease, Crohn's disease, and cirrhosis who was admitted acute on chronic renal failure and a left lower extremity cellulitis.  Etiology of renal insufficiency is unclear.    She has anemia of chronic kidney disease.  Iron studies in 11/2014 were normal.  Diet is good.  EGD and colonoscopy in 2011 were negative per patient report.  She has has had a fluctuating platelet count since 2009. Platelet count was consistently above 100,000 until 09/19/2013. At that time platelets were 72,000. Between 10/03/2014 and 10/16/2014 platelets range between 71,000 and 95,000.  With this admission, platelet count has decreased further (today 50,000).  Peripheral smear reveals no schistocytes.  No evidence of TTP.  Suspect etiology is multifactorial and likely due to underlying liver disease with associated splenomegaly, poor marrow reserve, possibly some component of ITP (patient has Crohn's disease), and consumption due to infection.  With patient's prior history of exposure to Lovenox and dialysis with heparin, she may have some component of HIT (doubt).  Platelet count slightly improved.  Platelet count in a blue top (citrate tube) today ruled out pseudothrombocytopenia.  PT/INR is elevated (46.4/5.03), now improved (INR 1.89) after vitamin K.  Additional normal labs:  B12, folate, TSH, ANA, hepatitis C, hepatitis B, SPEP, and kappa/lambda free light chain ratio.  Etiology unclear.  No evidence of DIC (fibrinogen normal).   Plan:   1.  Follow-up with interventional radiology regarding goal for INR and platelet count prior to planned procedure.  Would transfuse FFP if needed and platelets,  checking 1 hour post platelet count and INR.. 2.  Follow-up pending 24 hour urine for UPEP and free light chains. 3.  Continue to avoid heparin.   Thank you for allowing me to participate in Brandi Cline 's care.  I will follow her closely with you while hospitalized and after discharge in the outpatient department.  Lequita Asal, MD  01/27/2015, 8:25 PM

## 2015-01-27 NOTE — Care Management Important Message (Signed)
Important Message  Patient Details  Name: Brandi Cline MRN: [id number] Date of Birth: 23-Jun-1930   Medicare Important Message Given:  Yes-fourth notification given    Adonis Huguenin, RN 01/27/2015, 11:24 AM

## 2015-01-27 NOTE — Progress Notes (Signed)
Pre-hd tx 

## 2015-01-27 NOTE — Progress Notes (Signed)
HD tx start 

## 2015-01-27 NOTE — Progress Notes (Signed)
Nutrition Follow-up     INTERVENTION:  Meals and snacks: Cater to pt preferences Medical Nutrition Supplement Therapy: Continue ensure q daily for added nutrition    NUTRITION DIAGNOSIS:   Inadequate oral intake related to acute illness as evidenced by per patient/family report. Being addressed with po diet and supplements    GOAL:   Patient will meet greater than or equal to 90% of their needs    MONITOR:    (Energy Intake, Electrolyte and renal Profile, anthropometrics, Digestive System)  REASON FOR ASSESSMENT:   Diagnosis    ASSESSMENT:     Pt had HD today  Current Nutrition: tolerating renal diet but does not really like hospital foods (per I and O sheet intake 90-100% of meals), drinking ensure   Gastrointestinal Profile: Last BM: 10/17   Scheduled Medications:  . arformoterol  15 mcg Nebulization BID  . feeding supplement (ENSURE ENLIVE)  237 mL Oral Q24H  . pantoprazole  40 mg Oral Daily  . vancomycin  750 mg Intravenous Q M,W,F-HD    Continuous Medications:  . anticoagulant sodium citrate       Electrolyte/Renal Profile and Glucose Profile:   Recent Labs Lab 01/22/15 1339 01/23/15 1040 01/24/15 0408 01/27/15 0645  NA 144  --  144 142  K 4.6  --  3.4* 4.1  CL 119*  --  116* 104  CO2 14*  --  18* 28  BUN 71*  --  52* 26*  CREATININE 5.44*  --  4.52* 3.59*  CALCIUM 8.3*  --  8.1* 8.9  MG 1.4*  --  1.8  --   PHOS 8.2* 7.4*  --   --   GLUCOSE 131*  --  87 101*   Protein Profile:  Recent Labs Lab 01/22/15 1339  ALBUMIN 2.9*     Weight Trend since Admission: Filed Weights   01/27/15 0541 01/27/15 0935 01/27/15 1303  Weight: 145 lb 11.2 oz (66.089 kg) 145 lb 15.1 oz (66.2 kg) 144 lb 10 oz (65.6 kg)      Diet Order:  Diet renal with fluid restriction Fluid restriction:: 1200 mL Fluid; Room service appropriate?: Yes; Fluid consistency:: Thin  Skin:   reviewed  Last BM:  01/23/2015, c.diff pending  Height:   Ht Readings  from Last 1 Encounters:  01/22/15 5\' 4"  (1.626 m)    Weight:   Wt Readings from Last 1 Encounters:  01/27/15 144 lb 10 oz (65.6 kg)     BMI:  Body mass index is 24.81 kg/(m^2).  Estimated Nutritional Needs:   Kcal:  BEE: 1165kcals, TEE: (IF 1.1-1.3)(AF 1.2) 1538-1817kcals  Protein:  79-93g protein (1.1-1.3g/kg)  Fluid:  UOP+1L  EDUCATION NEEDS:   Education needs no appropriate at this time  LOW Care Level   Lorren Splawn B. Freida Busman, RD, LDN [phone] (pager)

## 2015-01-27 NOTE — Progress Notes (Signed)
HD tx completed.

## 2015-01-27 NOTE — Telephone Encounter (Signed)
Called patient back and left a message.

## 2015-01-27 NOTE — Progress Notes (Signed)
[facility]  ROUNDING NOTE   Subjective:  Has thrombocytopenia and coagulopathy which could both be attributable to liver disease. Patient seen during dialysis Tolerating well  Reports feeling cold  Objective:  Vital signs in last 24 hours:  Temp:  [97.9 F (36.6 C)-98.4 F (36.9 C)] 97.9 F (36.6 C) (10/17 1303) Pulse Rate:  [58-80] 72 (10/17 1303) Resp:  [16-24] 20 (10/17 1303) BP: (102-141)/(37-47) 125/43 mmHg (10/17 1303) SpO2:  [93 %-98 %] 97 % (10/17 1303) Weight:  [65.6 kg (144 lb 10 oz)-66.2 kg (145 lb 15.1 oz)] 65.6 kg (144 lb 10 oz) (10/17 1303)  Weight change: 1.361 kg (3 lb) Filed Weights   01/27/15 0541 01/27/15 0935 01/27/15 1303  Weight: 66.089 kg (145 lb 11.2 oz) 66.2 kg (145 lb 15.1 oz) 65.6 kg (144 lb 10 oz)    Intake/Output: I/O last 3 completed shifts: In: 2231.2 [P.O.:780; I.V.:1451.2] Out: 70 [Urine:70]   Intake/Output this shift:  Total I/O In: 0  Out: 550 [Urine:50; Other:500]  Physical Exam: General: NAD  Head: Normocephalic, atraumatic. Moist oral mucosal membranes  Eyes: Anicteric  Neck: Supple, trachea midline  Lungs:  Clear to auscultation normal effort  Heart: Regular rate and rhythm  Abdomen:  Soft, nontender, BS present  Extremities:  trace peripheral edema.  Neurologic: Nonfocal, moving all four extremities  Skin: No lesions  Access: Right femoral temp HD catheter.    Basic Metabolic Panel:  Recent Labs Lab 01/22/15 1339 01/23/15 1040 01/24/15 0408 01/27/15 0645  NA 144  --  144 142  K 4.6  --  3.4* 4.1  CL 119*  --  116* 104  CO2 14*  --  18* 28  GLUCOSE 131*  --  87 101*  BUN 71*  --  52* 26*  CREATININE 5.44*  --  4.52* 3.59*  CALCIUM 8.3*  --  8.1* 8.9  MG 1.4*  --  1.8  --   PHOS 8.2* 7.4*  --   --     Liver Function Tests:  Recent Labs Lab 01/22/15 1339  AST 32  ALT 26  ALKPHOS 143*  BILITOT 1.2  PROT 5.6*  ALBUMIN 2.9*   No results for input(s): LIPASE, AMYLASE in the last 168  hours. No results for input(s): AMMONIA in the last 168 hours.  CBC:  Recent Labs Lab 01/22/15 1339 01/24/15 0408 01/25/15 0622 01/26/15 0658 01/27/15 0645  WBC 3.8 3.4* 4.1 3.5* 5.5  NEUTROABS  --   --   --  2.5  --   HGB 7.5* 7.0* 7.3* 7.4* 8.1*  HCT 23.8* 21.8* 22.5* 22.7* 25.4*  MCV 91.1 90.3 89.5 89.8 91.0  PLT 79* 59* 46* 50* 69*    Cardiac Enzymes: No results for input(s): CKTOTAL, CKMB, CKMBINDEX, TROPONINI in the last 168 hours.  BNP: Invalid input(s): POCBNP  CBG: No results for input(s): GLUCAP in the last 168 hours.  Microbiology: Results for orders placed or performed during the hospital encounter of 01/22/15  C difficile quick scan w PCR reflex     Status: None   Collection Time: 01/23/15  4:46 PM  Result Value Ref Range Status   C Diff antigen NEGATIVE NEGATIVE Final   C Diff toxin NEGATIVE NEGATIVE Final   C Diff interpretation Negative for C. difficile  Final    Coagulation Studies:  Recent Labs  01/26/15 0658 01/27/15 0645  LABPROT 46.4* 21.9*  INR 5.03* 1.89    Urinalysis: No results for input(s): COLORURINE, LABSPEC, [place], GLUCOSEU, HGBUR,  BILIRUBINUR, KETONESUR, PROTEINUR, UROBILINOGEN, NITRITE, LEUKOCYTESUR in the last 72 hours.  Invalid input(s): APPERANCEUR    Imaging: No results found.   Medications:   . anticoagulant sodium citrate     . arformoterol  15 mcg Nebulization BID  . feeding supplement (ENSURE ENLIVE)  237 mL Oral Q24H  . pantoprazole  40 mg Oral Daily  . vancomycin  750 mg Intravenous Q M,W,F-HD   acetaminophen **OR** acetaminophen, albuterol, ALPRAZolam, alteplase, anticoagulant sodium citrate, benzonatate, guaiFENesin-dextromethorphan, lidocaine (PF), lidocaine-prilocaine, loperamide, nitroGLYCERIN, ondansetron **OR** ondansetron (ZOFRAN) IV, pentafluoroprop-tetrafluoroeth, polyethylene glycol  Assessment/ Plan:  82 y.o. female with past medical history of coronary disease- with coronary stents,  Hypertension, Crohn's disease, COPD, uses home oxygen around the clock, Kidney stones- lithotripsy, ureteral stents, History of shingles, Arthritis, Hyperlipidemia, Partial resection of bowel for Crohn's disease, liver cirrhosis.   1.  Acute renal failure of undetermined etiology/CKD stage III.  The patient has had progressively worsening renal function since July of 2016.  EGFR was down to 6 as outpt most recently. Exact etiology of kidney disease unclear, had Bactrim in May. Has had rather fast renal function decline. - Serologic work up pending, etiology of kidney disease unclear but could be related to crohns/liver disease.  Unsafe to perform biopsy at present given low platelets and coagulopathy.  Appreciate input from hematology.  - Plan for FFP and platelet transfusion tomorrow AM - Renal biopsy after infusions are complete - discussed with patient  2.  Anemia of CKD/thrombocytopenia:  hgb 8.1  at the moment, low platelets noted at 69 k.   Renal biopsy tentatively tomorrow  3.  SHPTH of renal origin: follow up phos with next HD treatment.    LOS: 5 Shaunta Oncale 10/17/20161:57 PM

## 2015-01-27 NOTE — Telephone Encounter (Signed)
Received voice message from [facility] regarding missed appointments on 01/29/15. Please call to reschedule.

## 2015-01-28 ENCOUNTER — Inpatient Hospital Stay: Payer: Medicare HMO

## 2015-01-28 DIAGNOSIS — N184 Chronic kidney disease, stage 4 (severe): Secondary | ICD-10-CM

## 2015-01-28 DIAGNOSIS — I509 Heart failure, unspecified: Secondary | ICD-10-CM

## 2015-01-28 DIAGNOSIS — J449 Chronic obstructive pulmonary disease, unspecified: Secondary | ICD-10-CM

## 2015-01-28 DIAGNOSIS — I2510 Atherosclerotic heart disease of native coronary artery without angina pectoris: Secondary | ICD-10-CM

## 2015-01-28 LAB — CBC WITH DIFFERENTIAL/PLATELET
BASOS ABS: 0 10*3/uL (ref 0–0.1)
EOS ABS: 0.1 10*3/uL (ref 0–0.7)
HCT: 25.5 % — ABNORMAL LOW (ref 35.0–47.0)
Hemoglobin: 8.2 g/dL — ABNORMAL LOW (ref 12.0–16.0)
Lymphs Abs: 0.5 10*3/uL — ABNORMAL LOW (ref 1.0–3.6)
MCH: 29.3 pg (ref 26.0–34.0)
MCHC: 32.3 g/dL (ref 32.0–36.0)
MCV: 90.9 fL (ref 80.0–100.0)
MONO ABS: 0.5 10*3/uL (ref 0.2–0.9)
Monocytes Relative: 11 %
Neutro Abs: 3.1 10*3/uL (ref 1.4–6.5)
Neutrophils Relative %: 75 %
PLATELETS: 76 10*3/uL — AB (ref 150–440)
RBC: 2.81 MIL/uL — ABNORMAL LOW (ref 3.80–5.20)
RDW: 14.8 % — AB (ref 11.5–14.5)
WBC: 4.2 10*3/uL (ref 3.6–11.0)

## 2015-01-28 LAB — PROTIME-INR
INR: 1.55
INR: 1.25
PROTHROMBIN TIME: 18.8 s — AB (ref 11.4–15.0)
Prothrombin Time: 15.9 seconds — ABNORMAL HIGH (ref 11.4–15.0)

## 2015-01-28 LAB — SEROTONIN RELEASE ASSAY (SRA)
SRA .2 IU/mL UFH Ser-aCnc: 1 % (ref 0–20)
SRA 100IU/mL UFH Ser-aCnc: <1 % (ref 0–20)

## 2015-01-28 LAB — CBC
HCT: 20.5 % — ABNORMAL LOW (ref 35.0–47.0)
HCT: 20.6 % — ABNORMAL LOW (ref 35.0–47.0)
Hemoglobin: 6.6 g/dL — ABNORMAL LOW (ref 12.0–16.0)
Hemoglobin: 6.5 g/dL — ABNORMAL LOW (ref 12.0–16.0)
MCH: 28.9 pg (ref 26.0–34.0)
MCH: 28.9 pg (ref 26.0–34.0)
MCHC: 32 g/dL (ref 32.0–36.0)
MCHC: 31.7 g/dL — AB (ref 32.0–36.0)
MCV: 90.6 fL (ref 80.0–100.0)
MCV: 91.1 fL (ref 80.0–100.0)
PLATELETS: 48 10*3/uL — AB (ref 150–440)
Platelets: 72 10*3/uL — ABNORMAL LOW (ref 150–440)
RBC: 2.27 MIL/uL — AB (ref 3.80–5.20)
RBC: 2.26 MIL/uL — ABNORMAL LOW (ref 3.80–5.20)
RDW: 15.1 % — ABNORMAL HIGH (ref 11.5–14.5)
RDW: 14.9 % — AB (ref 11.5–14.5)
WBC: 3.1 10*3/uL — AB (ref 3.6–11.0)
WBC: 3.2 10*3/uL — AB (ref 3.6–11.0)

## 2015-01-28 LAB — HEPARIN INDUCED PLATELET AB (HIT ANTIBODY): Heparin Induced Plt Ab: 1.143 OD — ABNORMAL HIGH (ref 0.000–0.400)

## 2015-01-28 LAB — PREPARE RBC (CROSSMATCH)

## 2015-01-28 MED ORDER — SODIUM CHLORIDE 0.9 % IV SOLN
Freq: Once | INTRAVENOUS | Status: AC
  Administered 2015-01-28: 11:00:00 via INTRAVENOUS

## 2015-01-28 MED ORDER — SODIUM CHLORIDE 0.9 % IV BOLUS (SEPSIS)
250.0000 mL | INTRAVENOUS | Status: AC
  Administered 2015-01-28: 250 mL via INTRAVENOUS

## 2015-01-28 MED ORDER — SODIUM CHLORIDE 0.9 % IV SOLN
Freq: Once | INTRAVENOUS | Status: AC
  Administered 2015-01-28: 13:00:00 via INTRAVENOUS

## 2015-01-28 NOTE — Care Management Important Message (Signed)
Important Message  Patient Details  Name: Paytience Bures MRN: [id number] Date of Birth: [birth year]   Medicare Important Message Given:  Yes-third notification given    [name] 01/28/2015, 10:20 AM

## 2015-01-28 NOTE — Progress Notes (Signed)
tx started with no issues/denies complaints/bil le edema noted/access dressing with noted /no active bleeding noted at this time/cleaned dressing applied now dry/intact/resting in bed for tx/lungs rhonchi/diminished/a&ox 4/hrr/cath intact

## 2015-01-28 NOTE — Progress Notes (Signed)
md notified of pt bp levels and requests for po xanax. Xanax is ok to give pr dr. Anne Hahn. Recheck BP in 1 hour to make sure DBP has not dropped significantly.

## 2015-01-28 NOTE — Progress Notes (Signed)
FFP infusing; temp elevated to 99, BP dropped to 91/28; placed in trendelenburg position; Dr. Betti Cruz notified; FFP held for NS bolus; Instructed to monitor BP post bolus and restart FFP; Windy Carina, RN; 6:27 AM; 01/28/2015

## 2015-01-28 NOTE — Progress Notes (Signed)
Dr. Betti Cruz notified of temp decrease; BP improvement for short time; pt. C/o ":tightness in chest"; bolus stopped; instructed to continue to monitor and continue to infuse FFP; Windy Carina, RN; 6:53 AM; 01/28/2015

## 2015-01-28 NOTE — Progress Notes (Signed)
[facility] -  at [facility]   PATIENT NAME: Brandi Cline    MR#:  [id number]  DATE OF BIRTH:  [birth year]  SUBJECTIVE:  CHIEF COMPLAINT:  Patient is reporting blackish diarrhea, has h/o crohns disease. Denies alcohol intake and any h/o hepatitis. Scheduled for transfusion and  dialysis today    REVIEW OF SYSTEMS:  CONSTITUTIONAL: No fever, fatigue or weakness.  EYES: No blurred or double vision.  EARS, NOSE, AND THROAT: No tinnitus or ear pain.  RESPIRATORY: No cough, shortness of breath, wheezing or hemoptysis.  CARDIOVASCULAR: No chest pain, orthopnea, edema.  GASTROINTESTINAL: Reports dark diarrhea.  No nausea, vomiting,  or abdominal pain.  GENITOURINARY: No dysuria, hematuria.  ENDOCRINE: No polyuria, nocturia,  HEMATOLOGY: No anemia, reports bruising . SKIN: No rash or lesion. Left leg  redness is  better MUSCULOSKELETAL: No joint pain or arthritis.   NEUROLOGIC: No tingling, numbness, weakness.  PSYCHIATRY: No anxiety or depression.   DRUG ALLERGIES:   Allergies  Allergen Reactions  . Avelox [Moxifloxacin Hcl In Nacl] Shortness Of Breath, Swelling and Rash  . Codeine Shortness Of Breath and Other (See Comments)    Couldn't breath  . Theophyllines     Slight increased heart rate  . Ciprofloxacin Other (See Comments)    nervous  . Milk-Related Compounds Other (See Comments)    Whole milk hurts stomach. Not all dairy products do this.  . Tape     Please use paper tape only  . Lisinopril Rash    VITALS:  Blood pressure 136/42, pulse 80, temperature 100 F (37.8 C), temperature source Oral, resp. rate 20, height  (1.626 m), weight 69.5 kg (153 lb 3.5 oz), SpO2 94 %.  PHYSICAL EXAMINATION:  GENERAL:  81 y.o.-year-old patient lying in the bed with no acute distress.  EYES: Pupils equal, round, reactive to light and accommodation. No scleral icterus. Extraocular muscles intact.  HEENT: Head atraumatic, normocephalic. Oropharynx and  nasopharynx clear.  NECK:  Supple, no jugular venous distention. No thyroid enlargement, no tenderness.  LUNGS: Normal breath sounds bilaterally, no wheezing, rales,rhonchi or crepitation. No use of accessory muscles of respiration.  CARDIOVASCULAR: S1, S2 normal. No murmurs, rubs, or gallops.  ABDOMEN: Soft, nontender, nondistended. Bowel sounds present. No organomegaly or mass.  EXTREMITIES: No pedal edema, cyanosis, or clubbing. Temp dialysis catheter site is intact, no oozing noticed  NEUROLOGIC: Cranial nerves II through XII are intact. Muscle strength 5/5 in all extremities. Sensation intact. Gait not checked.  PSYCHIATRIC: The patient is alert and oriented x 3.  SKIN: No obvious rash, lesion, or ulcer. Left lower extremity with decreased  erythema and tenderness   LABORATORY PANEL:   CBC  Recent Labs Lab 01/28/15 2008  WBC 4.2  HGB 8.2*  HCT 25.5*  PLT 76*   ------------------------------------------------------------------------------------------------------------------  Chemistries   Recent Labs Lab 01/24/15 0408  01/27/15 2118  NA 144  < > 140  K 3.4*  < > 3.8  CL 116*  < > 102  CO2 18*  < > 29  GLUCOSE 87  < > 144*  BUN 52*  < > 15  CREATININE 4.52*  < > 2.38*  CALCIUM 8.1*  < > 8.1*  MG 1.8  --   --   AST  --   --  53*  ALT  --   --  16  ALKPHOS  --   --  161*  BILITOT  --   --  2.3*  < > =  values in this interval not displayed. ------------------------------------------------------------------------------------------------------------------  Cardiac Enzymes No results for input(s): TROPONINI in the last 168 hours. ------------------------------------------------------------------------------------------------------------------  RADIOLOGY:  Dg Chest 2 View  01/28/2015  CLINICAL DATA:  Cough and hemoptysis EXAM: CHEST - 2 VIEW COMPARISON:  01/22/2015 FINDINGS: Cardiac shadow is mildly enlarged but stable. Aortic calcifications are again seen. Chronic  interstitial changes are again seen throughout both lungs and stable. Small pleural effusions are noted. Mild increased density is noted in the left mid lung which may represent some acute on chronic infiltrate. No other focal abnormality is seen. IMPRESSION: Chronic changes with findings suggestive of acute on chronic infiltrate in the left mid lung. Electronically Signed   By: Alcide Clever M.D.   On: 01/28/2015 18:14    EKG:   Orders placed or performed during the hospital encounter of 09/19/13  . EKG 12-Lead  . EKG 12-Lead  . EKG    ASSESSMENT AND PLAN:   #1 acute renal failure on CKD3 progressing  to ESRD ?-  With volume overload with edema and shortness of breath at the time of admission   SOB resolved with dialysis  Had a temporary dialysis catheter placed in the right femoral vein by vascular surgery 10/12 Dialysis catheter will be packed with citrate instead of heparin in view of thrombocytopenia Patient will get renal biopsy by nephrology tomorrow if INR is better, s/p  transfusion of platelets and FFP. Following transfusion rpt CBC with hb at 8.2, platelets 76,000 and INR at 1.25.  Will monitor I's and O's and daily weights. Npo after MN  #  left lower extremity cellulitis:  Elevate the leg.  Discontinued  Ancef. the patient is started on on vancomycin 01/24/2015 and will continue the same, 2 more doses as clinically significantly improved  #  chronic respiratory failure on 3 L nasal cannula due to COPD due to smoking: Oxygenation is stable at this time.   chest x-ray with no acute abnormalities. Continue home regimen of brovana and Spiriva  #4 acute on chronic anemia:  Losses from Crohns and from anemia of CKD Last hemoglobin recorded at 8.2 after prbc transfusion today Check FOBT Gi consult placed Repeat CBC in a.m. and will consider transfusion during hemodialysis if hemoglobin is less  7  #5 GERD: protonix  #6 anxiety: continue xanax as she takes this at home.  #7  thrombocytopenia/ coagulopathy Appreciate oncology recs Vit k sq given once 10/16 for coagulopathy May need transfusion of platelets and FFP in am prior to bx Discontinued Lovenox. Discontinued heparin flushes and heparin given during dialysis.  F/u Hit panel and hepatitis labs  CODE STATUS: DNR ,discussed with patient and daughter and sister at bedside on admission.     All the records are reviewed and case discussed with Care Management/Social Workerr. Management plans discussed with the patient, family and they are in agreement.    TOTAL TIME TAKING CARE OF THIS PATIENT: 35 minutes.   POSSIBLE D/C IN ? DAYS, DEPENDING ON CLINICAL CONDITION.   Ramonita Lab M.D on 01/28/2015 at 10:05 PM  Between 7am to 6pm - Pager - [phone] After 6pm go to [email or web address] - password [email or web address] [facility]  [facility]  Office  [phone]  CC: Primary care physician; [name], MD

## 2015-01-28 NOTE — OR Nursing (Signed)
Dr Bonnielee Haff notified of abnormal labs including platelet 48, will have to delay kidney biopsy until labs improve. Jeannett Senior pt nurse notified.

## 2015-01-28 NOTE — Progress Notes (Signed)
Subjective:  Has thrombocytopenia and coagulopathy which could both be attributable to liver cirrhosis. Drop in hemoglobin noted. Platelet count is also low. Patient reports blood-tinged sputum She also reports dark stools Patient also reports loose stools multiple times during the day because of Crohn's. C. difficile was negative this admission  Objective:  Vital signs in last 24 hours:  Temp:  [97.9 F (36.6 C)-99 F (37.2 C)] 98.1 F (36.7 C) (10/18 1114) Pulse Rate:  [71-84] 81 (10/18 1114) Resp:  [18-21] 20 (10/18 1114) BP: (91-141)/(28-49) 130/49 mmHg (10/18 1114) SpO2:  [95 %-98 %] 98 % (10/18 1114) Weight:  [65.6 kg (144 lb 10 oz)-68.5 kg (151 lb 0.2 oz)] 68.5 kg (151 lb 0.2 oz) (10/18 0500)  Weight change: 0.111 kg (3.9 oz) Filed Weights   01/27/15 0935 01/27/15 1303 01/28/15 0500  Weight: 66.2 kg (145 lb 15.1 oz) 65.6 kg (144 lb 10 oz) 68.5 kg (151 lb 0.2 oz)    Intake/Output: I/O last 3 completed shifts: In: 622 [I.V.:40; Blood:330; IV Piggyback:174] Out: 600 [Urine:100; Other:500]   Intake/Output this shift:     Physical Exam: General: NAD, sitting up eating breakfast   Head: Normocephalic, atraumatic. Moist oral mucosal membranes  Eyes: Anicteric  Neck: Supple, trachea midline  Lungs:  Clear to auscultation normal effort  Heart: Regular rate and rhythm  Abdomen:  Soft, nontender, BS present  Extremities:  trace peripheral edema.  Neurologic: Nonfocal, moving all four extremities  Skin: No lesions  Access: Right femoral temp HD catheter.    Basic Metabolic Panel:  Recent Labs Lab 01/22/15 1339 01/23/15 1040 01/24/15 0408 01/27/15 0645 01/27/15 2118  NA 144  --  144 142 140  K 4.6  --  3.4* 4.1 3.8  CL 119*  --  116* 104 102  CO2 14*  --  18* 28 29  GLUCOSE 131*  --  87 101* 144*  BUN 71*  --  52* 26* 15  CREATININE 5.44*  --  4.52* 3.59* 2.38*  CALCIUM 8.3*  --  8.1* 8.9 8.1*  MG 1.4*  --  1.8  --   --   PHOS 8.2* 7.4*  --   --   --      Liver Function Tests:  Recent Labs Lab 01/22/15 1339 01/27/15 2118  AST 32 53*  ALT 26 16  ALKPHOS 143* 161*  BILITOT 1.2 2.3*  PROT 5.6* 5.5*  ALBUMIN 2.9* 2.7*   No results for input(s): LIPASE, AMYLASE in the last 168 hours. No results for input(s): AMMONIA in the last 168 hours.  CBC:  Recent Labs Lab 01/24/15 0408 01/25/15 0622 01/26/15 0658 01/27/15 0645 01/28/15 0358  WBC 3.4* 4.1 3.5* 5.5 3.1*  NEUTROABS  --   --  2.5  --   --   HGB 7.0* 7.3* 7.4* 8.1* 6.6*  HCT 21.8* 22.5* 22.7* 25.4* 20.5*  MCV 90.3 89.5 89.8 91.0 90.6  PLT 59* 46* 50* 69* 48*    Cardiac Enzymes: No results for input(s): CKTOTAL, CKMB, CKMBINDEX, TROPONINI in the last 168 hours.  BNP: Invalid input(s): POCBNP  CBG: No results for input(s): GLUCAP in the last 168 hours.  Microbiology: Results for orders placed or performed during the hospital encounter of 01/22/15  C difficile quick scan w PCR reflex     Status: None   Collection Time: 01/23/15  4:46 PM  Result Value Ref Range Status   C Diff antigen NEGATIVE NEGATIVE Final   C Diff toxin NEGATIVE NEGATIVE Final  C Diff interpretation Negative for C. difficile  Final    Coagulation Studies:  Recent Labs  01/26/15 0658 01/27/15 0645 01/28/15 0358  LABPROT 46.4* 21.9* 18.8*  INR 5.03* 1.89 1.55    Urinalysis: No results for input(s): COLORURINE, LABSPEC, PHURINE, GLUCOSEU, HGBUR, BILIRUBINUR, KETONESUR, PROTEINUR, UROBILINOGEN, NITRITE, LEUKOCYTESUR in the last 72 hours.  Invalid input(s): APPERANCEUR    Imaging: No results found.   Medications:   . anticoagulant sodium citrate     . sodium chloride   Intravenous Once  . arformoterol  15 mcg Nebulization BID  . feeding supplement (ENSURE ENLIVE)  237 mL Oral Q24H  . pantoprazole  40 mg Oral Daily  . vancomycin  750 mg Intravenous Q M,W,F-HD   acetaminophen **OR** acetaminophen, albuterol, ALPRAZolam, alteplase, anticoagulant sodium citrate, benzonatate,  guaiFENesin-dextromethorphan, lidocaine (PF), lidocaine-prilocaine, loperamide, nitroGLYCERIN, ondansetron **OR** ondansetron (ZOFRAN) IV, pentafluoroprop-tetrafluoroeth, polyethylene glycol  Assessment/ Plan:  81 y.o. female with past medical history of coronary disease- with coronary stents, Hypertension, Crohn's disease, COPD, uses home oxygen around the clock, Kidney stones- lithotripsy, ureteral stents, History of shingles, Arthritis, Hyperlipidemia, Partial resection of bowel for Crohn's disease, liver cirrhosis.   1.  Acute renal failure of undetermined etiology/CKD stage III.  The patient has had progressively worsening renal function since July of 2016.  EGFR was down to 6 as outpt most recently. Exact etiology of kidney disease unclear, had Bactrim in May. Has had rather fast renal function decline. - Serologic work up pending, etiology of kidney disease unclear but could be related to crohns/liver disease.    - Patient reports that she was seen by GI Specialist for liver disease back in Summer (Dr Timmothy Euler). It appears that the plan was observation at that time - Plan for FFP and platelet transfusion today and tomorrow AM - Renal biopsy after infusions are complete - discussed with patient and her daughter - increased risk of bleeding and concerns were voiced to family and patient. No real alternative except accepting ESRD of unknown cause vs undergoing relatively high risk biopsy to define the cause of underlying renal failure  2.  Anemia of CKD/thrombocytopenia:  hgb 6.6  at the moment, l PRBC transfusion with short dialysis today guiac stools Renal biopsy tentatively tomorrow Pack cathter with citrate  3.  SHPTH of renal origin: follow up phos with next HD treatment.    LOS: 6 Nasim Habeeb 10/18/201611:46 AM

## 2015-01-29 ENCOUNTER — Inpatient Hospital Stay: Payer: Medicare HMO

## 2015-01-29 ENCOUNTER — Ambulatory Visit: Payer: Medicare HMO

## 2015-01-29 ENCOUNTER — Other Ambulatory Visit: Payer: Medicare HMO

## 2015-01-29 LAB — COMPREHENSIVE METABOLIC PANEL
ALBUMIN: 2.9 g/dL — AB (ref 3.5–5.0)
ALK PHOS: 150 U/L — AB (ref 38–126)
ALT: 20 U/L (ref 14–54)
AST: 48 U/L — ABNORMAL HIGH (ref 15–41)
Anion gap: 6 (ref 5–15)
BILIRUBIN TOTAL: 2.8 mg/dL — AB (ref 0.3–1.2)
BUN: 16 mg/dL (ref 6–20)
CO2: 33 mmol/L — AB (ref 22–32)
Calcium: 8.3 mg/dL — ABNORMAL LOW (ref 8.9–10.3)
Chloride: 102 mmol/L (ref 101–111)
Creatinine, Ser: 2.57 mg/dL — ABNORMAL HIGH (ref 0.44–1.00)
GFR calc Af Amer: 19 mL/min — ABNORMAL LOW (ref >60)
GFR calc non Af Amer: 16 mL/min — ABNORMAL LOW (ref >60)
GLUCOSE: 82 mg/dL (ref 65–99)
Potassium: 3.6 mmol/L (ref 3.5–5.1)
SODIUM: 141 mmol/L (ref 135–145)
TOTAL PROTEIN: 5.4 g/dL — AB (ref 6.5–8.1)

## 2015-01-29 LAB — CBC
HCT: 23.3 % — ABNORMAL LOW (ref 35.0–47.0)
Hemoglobin: 7.5 g/dL — ABNORMAL LOW (ref 12.0–16.0)
MCH: 29.5 pg (ref 26.0–34.0)
MCHC: 32.4 g/dL (ref 32.0–36.0)
MCV: 90.9 fL (ref 80.0–100.0)
Platelets: 68 10*3/uL — ABNORMAL LOW (ref 150–440)
RBC: 2.56 MIL/uL — AB (ref 3.80–5.20)
RDW: 14.4 % (ref 11.5–14.5)
WBC: 3.1 10*3/uL — AB (ref 3.6–11.0)

## 2015-01-29 LAB — PREPARE PLATELET PHERESIS: Unit division: 0

## 2015-01-29 LAB — PREPARE FRESH FROZEN PLASMA: UNIT DIVISION: 0

## 2015-01-29 LAB — OCCULT BLOOD X 1 CARD TO LAB, STOOL: FECAL OCCULT BLD: POSITIVE — AB

## 2015-01-29 LAB — HEMOGLOBIN: Hemoglobin: 7.7 g/dL — ABNORMAL LOW (ref 12.0–16.0)

## 2015-01-29 LAB — PROTIME-INR
INR: 1.34
Prothrombin Time: 16.8 seconds — ABNORMAL HIGH (ref 11.4–15.0)

## 2015-01-29 LAB — AMMONIA: Ammonia: 49 umol/L — ABNORMAL HIGH (ref 9–35)

## 2015-01-29 MED ORDER — SODIUM CHLORIDE 0.9 % IV SOLN
Freq: Once | INTRAVENOUS | Status: AC
  Administered 2015-01-29: 09:00:00 via INTRAVENOUS

## 2015-01-29 NOTE — Progress Notes (Signed)
MD notified of pt diastolic BP

## 2015-01-29 NOTE — Progress Notes (Signed)
Subjective:  Has thrombocytopenia and coagulopathy which could both be attributable to liver cirrhosis. Received platelet and blood transfusion. She also reports dark stools- hemoccult positive. GI evaluation is pending Patient also reports loose stools multiple times during the day because of Crohn's. C. difficile was negative this admission  Objective:  Vital signs in last 24 hours:  Temp:  [98 F (36.7 C)-100.2 F (37.9 C)] 98.1 F (36.7 C) (10/19 1009) Pulse Rate:  [68-83] 68 (10/19 1009) Resp:  [17-29] 20 (10/19 0933) BP: (96-147)/(25-76) 96/41 mmHg (10/19 1009) SpO2:  [92 %-100 %] 100 % (10/19 1009) Weight:  [69.5 kg (153 lb 3.5 oz)-70.3 kg (154 lb 15.7 oz)] 69.673 kg (153 lb 9.6 oz) (10/19 0500)  Weight change: 4.1 kg (9 lb 0.6 oz) Filed Weights   01/28/15 1429 01/28/15 1735 01/29/15 0500  Weight: 70.3 kg (154 lb 15.7 oz) 69.5 kg (153 lb 3.5 oz) 69.673 kg (153 lb 9.6 oz)    Intake/Output: I/O last 3 completed shifts: In: 1024 [P.O.:480; I.V.:40; Blood:330; IV Piggyback:174] Out: 0    Intake/Output this shift:  Total I/O In: 0  Out: 75 [Urine:75]  Physical Exam: General: NAD,    Head: Normocephalic, atraumatic. Moist oral mucosal membranes  Eyes: Anicteric  Neck: Supple, trachea midline  Lungs:  Clear to auscultation normal effort  Heart: Regular rate and rhythm  Abdomen:  Soft, nontender, BS present  Extremities:  trace peripheral edema.  Neurologic: Nonfocal, moving all four extremities  Skin: No lesions  Access: Right femoral temp HD catheter.    Basic Metabolic Panel:  Recent Labs Lab 01/22/15 1339 01/23/15 1040 01/24/15 0408 01/27/15 0645 01/27/15 2118 01/29/15 0603  NA 144  --  144 142 140 141  K 4.6  --  3.4* 4.1 3.8 3.6  CL 119*  --  116* 104 102 102  CO2 14*  --  18* 28 29 33*  GLUCOSE 131*  --  87 101* 144* 82  BUN 71*  --  52* 26* 15 16  CREATININE 5.44*  --  4.52* 3.59* 2.38* 2.57*  CALCIUM 8.3*  --  8.1* 8.9 8.1* 8.3*  MG 1.4*  --   1.8  --   --   --   PHOS 8.2* 7.4*  --   --   --   --     Liver Function Tests:  Recent Labs Lab 01/22/15 1339 01/27/15 2118 01/29/15 0603  AST 32 53* 48*  ALT 26 16 20   ALKPHOS 143* 161* 150*  BILITOT 1.2 2.3* 2.8*  PROT 5.6* 5.5* 5.4*  ALBUMIN 2.9* 2.7* 2.9*   No results for input(s): LIPASE, AMYLASE in the last 168 hours.  Recent Labs Lab 01/29/15 0603  AMMONIA 49*    CBC:  Recent Labs Lab 01/26/15 0658 01/27/15 0645 01/28/15 0358 01/28/15 1526 01/28/15 2008 01/29/15 0603  WBC 3.5* 5.5 3.1* 3.2* 4.2 3.1*  NEUTROABS 2.5  --   --   --  3.1  --   HGB 7.4* 8.1* 6.6* 6.5* 8.2* 7.5*  HCT 22.7* 25.4* 20.5* 20.6* 25.5* 23.3*  MCV 89.8 91.0 90.6 91.1 90.9 90.9  PLT 50* 69* 48* 72* 76* 68*    Cardiac Enzymes: No results for input(s): CKTOTAL, CKMB, CKMBINDEX, TROPONINI in the last 168 hours.  BNP: Invalid input(s): POCBNP  CBG: No results for input(s): GLUCAP in the last 168 hours.  Microbiology: Results for orders placed or performed during the hospital encounter of 01/22/15  C difficile quick scan w PCR reflex  Status: None   Collection Time: 01/23/15  4:46 PM  Result Value Ref Range Status   C Diff antigen NEGATIVE NEGATIVE Final   C Diff toxin NEGATIVE NEGATIVE Final   C Diff interpretation Negative for C. difficile  Final    Coagulation Studies:  Recent Labs  01/27/15 0645 01/28/15 0358 01/28/15 2008 01/29/15 0603  LABPROT 21.9* 18.8* 15.9* 16.8*  INR 1.89 1.55 1.25 1.34    Urinalysis: No results for input(s): COLORURINE, LABSPEC, PHURINE, GLUCOSEU, HGBUR, BILIRUBINUR, KETONESUR, PROTEINUR, UROBILINOGEN, NITRITE, LEUKOCYTESUR in the last 72 hours.  Invalid input(s): APPERANCEUR    Imaging: Dg Chest 2 View  01/28/2015  CLINICAL DATA:  Cough and hemoptysis EXAM: CHEST - 2 VIEW COMPARISON:  01/22/2015 FINDINGS: Cardiac shadow is mildly enlarged but stable. Aortic calcifications are again seen. Chronic interstitial changes are again  seen throughout both lungs and stable. Small pleural effusions are noted. Mild increased density is noted in the left mid lung which may represent some acute on chronic infiltrate. No other focal abnormality is seen. IMPRESSION: Chronic changes with findings suggestive of acute on chronic infiltrate in the left mid lung. Electronically Signed   By: Inez Catalina M.D.   On: 01/28/2015 18:14     Medications:   . anticoagulant sodium citrate     . arformoterol  15 mcg Nebulization BID  . feeding supplement (ENSURE ENLIVE)  237 mL Oral Q24H  . pantoprazole  40 mg Oral Daily  . vancomycin  750 mg Intravenous Q M,W,F-HD   acetaminophen **OR** acetaminophen, albuterol, ALPRAZolam, alteplase, anticoagulant sodium citrate, benzonatate, guaiFENesin-dextromethorphan, lidocaine (PF), lidocaine-prilocaine, loperamide, nitroGLYCERIN, ondansetron **OR** ondansetron (ZOFRAN) IV, pentafluoroprop-tetrafluoroeth, polyethylene glycol  Assessment/ Plan:  81 y.o. female with past medical history of coronary disease- with coronary stents, Hypertension, Crohn's disease, COPD, uses home oxygen around the clock, Kidney stones- lithotripsy, ureteral stents, History of shingles, Arthritis, Hyperlipidemia, Partial resection of bowel for Crohn's disease, liver cirrhosis.   1.  Acute renal failure of undetermined etiology/CKD stage III.  The patient has had progressively worsening renal function since July of 2016.  EGFR was down to 6 as outpt most recently. Exact etiology of kidney disease unclear, had Bactrim in May. Has had rather fast renal function decline. - Serologic work up pending, etiology of kidney disease unclear but could be related to crohns/liver disease.    - Patient reports that she was seen by GI Specialist for liver disease back in Summer (Dr Timmothy Euler). It appears that the plan was observation at that time -  platelet transfusion today  - Renal biopsy after infusions are complete - discussed with patient  and her daughter - increased risk of bleeding and concerns were voiced to family and patient. No real alternative except accepting ESRD of unknown cause vs undergoing relatively high risk biopsy to define the cause of underlying renal failure vs empiric steroids for 3-4 months Patient prefers biopsy to know the underlying cause  2.  Anemia of CKD/thrombocytopenia:  hgb 7.5  at the moment GI evaluation for hemoccult positive Renal biopsy today Pack cathter with citrate  3.  SHPTH of renal origin: follow up phos with next HD treatment.    LOS: 7 Chris Cripps 10/19/201610:43 AM

## 2015-01-29 NOTE — OR Nursing (Signed)
Call from [place], Dr Thedore Mins plans to perform Kidney biopsy today, after post Platelet lab results received, if stable. Dr Thedore Mins encouraged to speak with Dr Ardelle Anton, interventional radiologist today prior to procedure.

## 2015-01-29 NOTE — Procedures (Signed)
PROCEDURE: Informed written consent was obtained from the patient after a discussion of the risks, benefits and alternatives to treatment. The patient understands and consents the procedure. A timeout was performed prior to the initiation of the procedure.  Ultrasound scanning was performed of the bilateral flanks. The inferior pole of the RIGHT kidney was selected for biopsy due to location and sonographic window. The procedure was planned. The operative site was prepped and draped in the usual sterile fashion. The overlying soft tissues were anesthetized with 10 mL of 1% XYLOCAINE.  A 18 gauge core needle biopsy device was advanced into the inferior cortex of the RIGHT kidney and 2 core biopsies were obtained under direct ultrasound guidance. Real time pathologic review confirmed adequate tissue acquisition. Images were saved for documentation purposes. The biopsy device was removed and hemostasis was obtained with manual compression. Post procedural scanning was negative for significant post procedural hemorrhage or additional Complication, although a small ooze was seen. A dressing was placed. The patient tolerated the procedure well. Small hematoma was noted.

## 2015-01-29 NOTE — Progress Notes (Signed)
[facility] - [place] at [facility]   PATIENT NAME: Brandi Cline    MR#:  [id number]  DATE OF BIRTH:  1930/12/09  SUBJECTIVE:  CHIEF COMPLAINT: had right kidney biopsy,and now she is very hungry and wants to eat.  REVIEW OF SYSTEMS:  CONSTITUTIONAL: No fever, fatigue or weakness.  EYES: No blurred or double vision.  EARS, NOSE, AND THROAT: No tinnitus or ear pain.  RESPIRATORY: No cough, shortness of breath, wheezing or hemoptysis.  CARDIOVASCULAR: No chest pain, orthopnea, edema.  GASTROINTESTINAL: Reports dark diarrhea.  No nausea, vomiting,  or abdominal pain.  GENITOURINARY: No dysuria, hematuria.  ENDOCRINE: No polyuria, nocturia,  HEMATOLOGY: No anemia, reports bruising . SKIN: No rash or lesion. Left leg  redness is  better MUSCULOSKELETAL: No joint pain or arthritis.   NEUROLOGIC: No tingling, numbness, weakness.  PSYCHIATRY: No anxiety or depression.   DRUG ALLERGIES:   Allergies  Allergen Reactions  . Avelox [Moxifloxacin Hcl In Nacl] Shortness Of Breath, Swelling and Rash  . Codeine Shortness Of Breath and Other (See Comments)    Couldn't breath  . Theophyllines     Slight increased heart rate  . Ciprofloxacin Other (See Comments)    nervous  . Milk-Related Compounds Other (See Comments)    Whole milk hurts stomach. Not all dairy products do this.  . Tape     Please use paper tape only  . Lisinopril Rash    VITALS:  Blood pressure 137/37, pulse 81, temperature 98.3 F (36.8 C), temperature source Oral, resp. rate 18, height 5\' 4"  (1.626 m), weight 69.673 kg (153 lb 9.6 oz), SpO2 97 %.  PHYSICAL EXAMINATION:  GENERAL:  81 y.o.-year-old patient lying in the bed with no acute distress.  EYES: Pupils equal, round, reactive to light and accommodation. No scleral icterus. Extraocular muscles intact.  HEENT: Head atraumatic, normocephalic. Oropharynx and nasopharynx clear.  NECK:  Supple, no jugular venous distention. No thyroid  enlargement, no tenderness.  LUNGS: Normal breath sounds bilaterally, no wheezing, rales,rhonchi or crepitation. No use of accessory muscles of respiration.  CARDIOVASCULAR: S1, S2 normal. No murmurs, rubs, or gallops.  ABDOMEN: Soft, nontender, nondistended. Bowel sounds present. No organomegaly or mass.  EXTREMITIES: No pedal edema, cyanosis, or clubbing. Temp dialysis catheter site is intact, no oozing noticed  NEUROLOGIC: Cranial nerves II through XII are intact. Muscle strength 5/5 in all extremities. Sensation intact. Gait not checked.  PSYCHIATRIC: The patient is alert and oriented x 3.  SKIN: No obvious rash, lesion, or ulcer. Left lower extremity with decreased  erythema and tenderness   LABORATORY PANEL:   CBC  Recent Labs Lab 01/29/15 0603 01/29/15 1451  WBC 3.1*  --   HGB 7.5* 7.7*  HCT 23.3*  --   PLT 68*  --    ------------------------------------------------------------------------------------------------------------------  Chemistries   Recent Labs Lab 01/24/15 0408  01/29/15 0603  NA 144  < > 141  K 3.4*  < > 3.6  CL 116*  < > 102  CO2 18*  < > 33*  GLUCOSE 87  < > 82  BUN 52*  < > 16  CREATININE 4.52*  < > 2.57*  CALCIUM 8.1*  < > 8.3*  MG 1.8  --   --   AST  --   < > 48*  ALT  --   < > 20  ALKPHOS  --   < > 150*  BILITOT  --   < > 2.8*  < > = values  in this interval not displayed. ------------------------------------------------------------------------------------------------------------------  Cardiac Enzymes No results for input(s): TROPONINI in the last 168 hours. ------------------------------------------------------------------------------------------------------------------  RADIOLOGY:  Dg Chest 2 View  01/28/2015  CLINICAL DATA:  Cough and hemoptysis EXAM: CHEST - 2 VIEW COMPARISON:  01/22/2015 FINDINGS: Cardiac shadow is mildly enlarged but stable. Aortic calcifications are again seen. Chronic interstitial changes are again seen  throughout both lungs and stable. Small pleural effusions are noted. Mild increased density is noted in the left mid lung which may represent some acute on chronic infiltrate. No other focal abnormality is seen. IMPRESSION: Chronic changes with findings suggestive of acute on chronic infiltrate in the left mid lung. Electronically Signed   By: Alcide Clever M.D.   On: 01/28/2015 18:14   [place] Core Biopsy  01/29/2015  Mosetta Pigeon, MD     01/29/2015 11:57 AM PROCEDURE: Informed written consent was obtained from the patient after a discussion of the risks, benefits and alternatives to treatment. The patient understands and consents the procedure. A timeout was performed prior to the initiation of the procedure.  Ultrasound scanning was performed of the bilateral flanks. The inferior pole of the RIGHT kidney was selected for biopsy due to location and sonographic window. The procedure was planned. The operative site was prepped and draped in the usual sterile fashion. The overlying soft tissues were anesthetized with 10 mL of 1% XYLOCAINE.  A 18 gauge core needle biopsy device was advanced into the inferior cortex of the RIGHT kidney and 2 core biopsies were obtained under direct ultrasound guidance. Real time pathologic review confirmed adequate tissue acquisition. Images were saved for documentation purposes. The biopsy device was removed and hemostasis was obtained with manual compression. Post procedural scanning was negative for significant post procedural hemorrhage or additional Complication, although a small ooze was seen. A dressing was placed. The patient tolerated the procedure well. Small hematoma was noted.    EKG:   Orders placed or performed during the hospital encounter of 09/19/13  . EKG 12-Lead  . EKG 12-Lead  . EKG    ASSESSMENT AND PLAN:   #1 acute renal failure on CKD3 progressing  to ESRD ?-  With volume overload with edema and shortness of breath at the time of admission   SOB  resolved with dialysis  Had a temporary dialysis catheter placed in the right femoral vein by vascular surgery 10/12 Dialysis catheter will be packed with citrate instead of heparin in view of thrombocytopenia S/p renal biospy today,follow results Received platelets transfusion today.  Will monitor I's and O's and daily weights. Npo after MN  #  left lower extremity cellulitis:  Elevate the leg.    Cellulitis improved,continue Vanco for 2 more doses  #  chronic respiratory failure on 3 L nasal cannula due to COPD due to smoking: Oxygenation is stable at this time.   chest x-ray with no acute abnormalities. Continue home regimen of brovana and Spiriva  #4 acute on chronic anemia:  Losses from Crohns and from anemia of CKD Last hemoglobin recorded at 8.2 after prbc transfusion today Check FOBT Gi consult placed Repeat CBC in a.m. and will consider transfusion during hemodialysis if hemoglobin is less  7  #5 GERD: protonix  #6 anxiety: continue xanax as she takes this at home.  #7 thrombocytopenia/ coagulopathy likely due to liver disease  And crohns disease Appreciate oncology recs Vit k sq given once 10/16 for coagulopathy Discontinued Lovenox. Discontinued heparin flushes and heparin given during dialysis.  F/u Hit  panel and hepatitis labs  CODE STATUS: DNR ,discussed with patient and daughter and sister at bedside on admission.  Dispo;to ALF when stable.   All the records are reviewed and case discussed with Care Management/Social Workerr. Management plans discussed with the patient, family and they are in agreement.    TOTAL TIME TAKING CARE OF THIS PATIENT: 35 minutes.   POSSIBLE D/C IN ? DAYS, DEPENDING ON CLINICAL CONDITION.   Katha Hamming M.D on 01/29/2015 at 3:31 PM  Between 7am to 6pm - Pager - [phone] After 6pm go to [email or web address] - password [email or web address] [facility]  [facility]  Office  [phone]  CC: Primary care physician; [name], MD

## 2015-01-29 NOTE — Care Management (Signed)
Patient was open to [facility] prior to this admission. Home Health Agency aware.  Patient for kidney biopsy today. Continue to follow for discharge needs.

## 2015-01-29 NOTE — Care Management (Signed)
Patient out of room for renal biopsy this morning. Spoke with son Brandi Cline concerning discharge plan.  Son expressed interest in perhaps assisted living. Will await biopsy results for now. Continue to follow.

## 2015-01-30 LAB — TYPE AND SCREEN
ABO/RH(D): A POS
Antibody Screen: NEGATIVE
Unit division: 0
Unit division: 0
Unit division: 0

## 2015-01-30 LAB — PREPARE PLATELET PHERESIS: Unit division: 0

## 2015-01-30 LAB — CBC
HEMATOCRIT: 25.4 % — AB (ref 35.0–47.0)
Hemoglobin: 8.2 g/dL — ABNORMAL LOW (ref 12.0–16.0)
MCH: 29.6 pg (ref 26.0–34.0)
MCHC: 32.2 g/dL (ref 32.0–36.0)
MCV: 91.7 fL (ref 80.0–100.0)
PLATELETS: 92 10*3/uL — AB (ref 150–440)
RBC: 2.77 MIL/uL — ABNORMAL LOW (ref 3.80–5.20)
RDW: 14.9 % — AB (ref 11.5–14.5)
WBC: 6.1 10*3/uL (ref 3.6–11.0)

## 2015-01-30 LAB — BASIC METABOLIC PANEL
Anion gap: 5 (ref 5–15)
BUN: 26 mg/dL — AB (ref 6–20)
CHLORIDE: 103 mmol/L (ref 101–111)
CO2: 33 mmol/L — ABNORMAL HIGH (ref 22–32)
CREATININE: 3.53 mg/dL — AB (ref 0.44–1.00)
Calcium: 8.4 mg/dL — ABNORMAL LOW (ref 8.9–10.3)
GFR calc Af Amer: 13 mL/min — ABNORMAL LOW (ref >60)
GFR, EST NON AFRICAN AMERICAN: 11 mL/min — AB (ref >60)
GLUCOSE: 104 mg/dL — AB (ref 65–99)
Potassium: 3.9 mmol/L (ref 3.5–5.1)
SODIUM: 141 mmol/L (ref 135–145)

## 2015-01-30 LAB — HCV RNA QUANT: HCV QUANT: NOT DETECTED [IU]/mL (ref >50)

## 2015-01-30 LAB — HEPATITIS B SURFACE ANTIGEN: HEP B S AG: NEGATIVE

## 2015-01-30 MED ORDER — CEFAZOLIN SODIUM 1-5 GM-% IV SOLN
1.0000 g | INTRAVENOUS | Status: AC
  Administered 2015-01-31: 1 g via INTRAVENOUS
  Filled 2015-01-30: qty 50

## 2015-01-30 MED ORDER — PEG 3350-KCL-NA BICARB-NACL 420 G PO SOLR
4000.0000 mL | Freq: Once | ORAL | Status: DC
  Filled 2015-01-30: qty 4000

## 2015-01-30 NOTE — Progress Notes (Signed)
[facility] - [place] at [facility]   PATIENT NAME: Brandi Cline    MR#:  [id number]  DATE OF BIRTH:  1930/09/22  SUBJECTIVE: Patient feels better today. No chest pain, no shortness of breath.   CHIEF COMPLAINT:   REVIEW OF SYSTEMS:  CONSTITUTIONAL: No fever, fatigue or weakness.  EYES: No blurred or double vision.  EARS, NOSE, AND THROAT: No tinnitus or ear pain.  RESPIRATORY: No cough, shortness of breath, wheezing or hemoptysis.  CARDIOVASCULAR: No chest pain, orthopnea, edema.  GASTROINTESTINAL: Reports dark diarrhea.  No nausea, vomiting,  or abdominal pain.  GENITOURINARY: No dysuria, hematuria.  ENDOCRINE: No polyuria, nocturia,  HEMATOLOGY: No anemia, reports bruising . SKIN: No rash or lesion. Left leg  redness is  better MUSCULOSKELETAL: No joint pain or arthritis.   NEUROLOGIC: No tingling, numbness, weakness.  PSYCHIATRY: No anxiety or depression.   DRUG ALLERGIES:   Allergies  Allergen Reactions  . Avelox [Moxifloxacin Hcl In Nacl] Shortness Of Breath, Swelling and Rash  . Codeine Shortness Of Breath and Other (See Comments)    Couldn't breath  . Theophyllines     Slight increased heart rate  . Ciprofloxacin Other (See Comments)    nervous  . Milk-Related Compounds Other (See Comments)    Whole milk hurts stomach. Not all dairy products do this.  . Tape     Please use paper tape only  . Lisinopril Rash    VITALS:  Blood pressure 118/64, pulse 81, temperature 98.7 F (37.1 C), temperature source Oral, resp. rate 20, height 5\' 4"  (1.626 m), weight 68.125 kg (150 lb 3 oz), SpO2 96 %.  PHYSICAL EXAMINATION:  GENERAL:  81 y.o.-year-old patient lying in the bed with no acute distress.  EYES: Pupils equal, round, reactive to light and accommodation. No scleral icterus. Extraocular muscles intact.  HEENT: Head atraumatic, normocephalic. Oropharynx and nasopharynx clear.  NECK:  Supple, no jugular venous distention. No thyroid  enlargement, no tenderness.  LUNGS: Normal breath sounds bilaterally, no wheezing, rales,rhonchi or crepitation. No use of accessory muscles of respiration.  CARDIOVASCULAR: S1, S2 normal. No murmurs, rubs, or gallops.  ABDOMEN: Soft, nontender, nondistended. Bowel sounds present. No organomegaly or mass.  EXTREMITIES: No pedal edema, cyanosis, or clubbing. Temp dialysis catheter site is intact, no oozing noticed  NEUROLOGIC: Cranial nerves II through XII are intact. Muscle strength 5/5 in all extremities. Sensation intact. Gait not checked.  PSYCHIATRIC: The patient is alert and oriented x 3.  SKIN: No obvious rash, lesion, or ulcer. Left lower extremity with decreased  erythema and tenderness   LABORATORY PANEL:   CBC  Recent Labs Lab 01/29/15 0603 01/29/15 1451  WBC 3.1*  --   HGB 7.5* 7.7*  HCT 23.3*  --   PLT 68*  --    ------------------------------------------------------------------------------------------------------------------  Chemistries   Recent Labs Lab 01/24/15 0408  01/29/15 0603 01/30/15 0454  NA 144  < > 141 141  K 3.4*  < > 3.6 3.9  CL 116*  < > 102 103  CO2 18*  < > 33* 33*  GLUCOSE 87  < > 82 104*  BUN 52*  < > 16 26*  CREATININE 4.52*  < > 2.57* 3.53*  CALCIUM 8.1*  < > 8.3* 8.4*  MG 1.8  --   --   --   AST  --   < > 48*  --   ALT  --   < > 20  --   ALKPHOS  --   < >  150*  --   BILITOT  --   < > 2.8*  --   < > = values in this interval not displayed. ------------------------------------------------------------------------------------------------------------------  Cardiac Enzymes No results for input(s): TROPONINI in the last 168 hours. ------------------------------------------------------------------------------------------------------------------  RADIOLOGY:  Dg Chest 2 View  01/28/2015  CLINICAL DATA:  Cough and hemoptysis EXAM: CHEST - 2 VIEW COMPARISON:  01/22/2015 FINDINGS: Cardiac shadow is mildly enlarged but stable. Aortic  calcifications are again seen. Chronic interstitial changes are again seen throughout both lungs and stable. Small pleural effusions are noted. Mild increased density is noted in the left mid lung which may represent some acute on chronic infiltrate. No other focal abnormality is seen. IMPRESSION: Chronic changes with findings suggestive of acute on chronic infiltrate in the left mid lung. Electronically Signed   By: Alcide Clever M.D.   On: 01/28/2015 18:14   [place] Core Biopsy  01/29/2015  Mosetta Pigeon, MD     01/29/2015 11:57 AM PROCEDURE: Informed written consent was obtained from the patient after a discussion of the risks, benefits and alternatives to treatment. The patient understands and consents the procedure. A timeout was performed prior to the initiation of the procedure.  Ultrasound scanning was performed of the bilateral flanks. The inferior pole of the RIGHT kidney was selected for biopsy due to location and sonographic window. The procedure was planned. The operative site was prepped and draped in the usual sterile fashion. The overlying soft tissues were anesthetized with 10 mL of 1% XYLOCAINE.  A 18 gauge core needle biopsy device was advanced into the inferior cortex of the RIGHT kidney and 2 core biopsies were obtained under direct ultrasound guidance. Real time pathologic review confirmed adequate tissue acquisition. Images were saved for documentation purposes. The biopsy device was removed and hemostasis was obtained with manual compression. Post procedural scanning was negative for significant post procedural hemorrhage or additional Complication, although a small ooze was seen. A dressing was placed. The patient tolerated the procedure well. Small hematoma was noted.    EKG:   Orders placed or performed during the hospital encounter of 09/19/13  . EKG 12-Lead  . EKG 12-Lead  . EKG    ASSESSMENT AND PLAN:   #1 acute renal failure on CKD3 progressing  to ESRD ?-  With volume  overload with edema and shortness of breath at the time of admission   SOB resolved with dialysis  Had a temporary dialysis catheter placed in the right femoral vein by vascular surgery 10/12.pt to have  HD access tomorrow([facility]) S/p renal biospy ,follow results  Continue regular diet now.  #  left lower extremity cellulitis:  Elevate the leg.   Cellulitis improved,continue Vanco for 1 more dose  #  chronic respiratory failure on 3 L nasal cannula due to COPD due to smoking: Oxygenation is stable at this time.   chest x-ray with no acute abnormalities. Continue home regimen of brovana and Spiriva  #4 acute on chronic anemia:  Anemia, liver cirrhosis, Crohn's disease concerned about causes for cirrhosis thrombocytopenia ( [employer] [place])Patient has guaiac-positive stools will need GI workup , Will consult Dr. Mechele Collin.   #5 GERD: protonix  #6 anxiety: continue xanax as she takes this at home.  #7 thrombocytopenia/ coagulopathy likely due to liver disease  And crohns disease Appreciate oncology recs Vit k sq given once 10/16 for coagulopathy Discontinued Lovenox. Discontinued heparin flushes and heparin given during dialysis.  F/u Hit panel and hepatitis labs  CODE STATUS: DNR ,discussed with  patient and daughter and sister at bedside on admission.  Dispo;to ALF when stable.   All the records are reviewed and case discussed with Care Management/Social Workerr. Management plans discussed with the patient, family and they are in agreement.    TOTAL TIME TAKING CARE OF THIS PATIENT: 35 minutes.   POSSIBLE D/C IN ? DAYS, DEPENDING ON CLINICAL CONDITION.   Katha Hamming M.D on 01/30/2015 at 12:05 PM  Between 7am to 6pm - Pager - [phone] After 6pm go to [email or web address] - password [email or web address] [facility]  [facility]  Office  [phone]  CC: Primary care physician; [name], MD

## 2015-01-30 NOTE — Progress Notes (Signed)
Subjective:  Has thrombocytopenia and coagulopathy which could both be attributable to liver cirrhosis. Received platelet and blood transfusion. She also reports dark stools- hemoccult positive. GI evaluation is pending Patient also reports loose stools multiple times during the day because of Crohn's. C. difficile was negative this admission Overall doing well. No pain reported over the biopsy site. No hematuria.  Objective:  Vital signs in last 24 hours:  Temp:  [98.1 F (36.7 C)-98.9 F (37.2 C)] 98.7 F (37.1 C) (10/20 0944) Pulse Rate:  [73-85] 81 (10/20 0944) Resp:  [16-20] 20 (10/20 0944) BP: (113-148)/(33-76) 118/64 mmHg (10/20 0944) SpO2:  [92 %-99 %] 96 % (10/20 0944) Weight:  [68.125 kg (150 lb 3 oz)] 68.125 kg (150 lb 3 oz) (10/20 0500)  Weight change: -2.175 kg (-4 lb 12.7 oz) Filed Weights   01/28/15 1735 01/29/15 0500 01/30/15 0500  Weight: 69.5 kg (153 lb 3.5 oz) 69.673 kg (153 lb 9.6 oz) 68.125 kg (150 lb 3 oz)    Intake/Output: I/O last 3 completed shifts: In: 49 [P.O.:960] Out: 125 [Urine:125]   Intake/Output this shift:  Total I/O In: -  Out: 150 [Urine:150]  Physical Exam: General: NAD,    Head: Normocephalic, atraumatic. Moist oral mucosal membranes  Eyes: Anicteric  Neck: Supple, trachea midline  Lungs:  Clear to auscultation normal effort  Heart: Regular rate and rhythm  Abdomen:  Soft, nontender, BS present  Extremities:  + peripheral edema.  Neurologic: Nonfocal, moving all four extremities  Skin: No lesions  Access: Right femoral temp HD catheter.    Basic Metabolic Panel:  Recent Labs Lab 01/24/15 0408 01/27/15 0645 01/27/15 2118 01/29/15 0603 01/30/15 0454  NA 144 142 140 141 141  K 3.4* 4.1 3.8 3.6 3.9  CL 116* 104 102 102 103  CO2 18* 28 29 33* 33*  GLUCOSE 87 101* 144* 82 104*  BUN 52* 26* 15 16 26*  CREATININE 4.52* 3.59* 2.38* 2.57* 3.53*  CALCIUM 8.1* 8.9 8.1* 8.3* 8.4*  MG 1.8  --   --   --   --     Liver  Function Tests:  Recent Labs Lab 01/27/15 2118 01/29/15 0603  AST 53* 48*  ALT 16 20  ALKPHOS 161* 150*  BILITOT 2.3* 2.8*  PROT 5.5* 5.4*  ALBUMIN 2.7* 2.9*   No results for input(s): LIPASE, AMYLASE in the last 168 hours.  Recent Labs Lab 01/29/15 0603  AMMONIA 49*    CBC:  Recent Labs Lab 01/26/15 0658 01/27/15 0645 01/28/15 0358 01/28/15 1526 01/28/15 2008 01/29/15 0603 01/29/15 1451  WBC 3.5* 5.5 3.1* 3.2* 4.2 3.1*  --   NEUTROABS 2.5  --   --   --  3.1  --   --   HGB 7.4* 8.1* 6.6* 6.5* 8.2* 7.5* 7.7*  HCT 22.7* 25.4* 20.5* 20.6* 25.5* 23.3*  --   MCV 89.8 91.0 90.6 91.1 90.9 90.9  --   PLT 50* 69* 48* 72* 76* 68*  --     Cardiac Enzymes: No results for input(s): CKTOTAL, CKMB, CKMBINDEX, TROPONINI in the last 168 hours.  BNP: Invalid input(s): POCBNP  CBG: No results for input(s): GLUCAP in the last 168 hours.  Microbiology: Results for orders placed or performed during the hospital encounter of 01/22/15  C difficile quick scan w PCR reflex     Status: None   Collection Time: 01/23/15  4:46 PM  Result Value Ref Range Status   C Diff antigen NEGATIVE NEGATIVE Final   C  Diff toxin NEGATIVE NEGATIVE Final   C Diff interpretation Negative for C. difficile  Final    Coagulation Studies:  Recent Labs  01/28/15 0358 01/28/15 2008 01/29/15 0603  LABPROT 18.8* 15.9* 16.8*  INR 1.55 1.25 1.34    Urinalysis: No results for input(s): COLORURINE, LABSPEC, PHURINE, GLUCOSEU, HGBUR, BILIRUBINUR, KETONESUR, PROTEINUR, UROBILINOGEN, NITRITE, LEUKOCYTESUR in the last 72 hours.  Invalid input(s): APPERANCEUR    Imaging: Dg Chest 2 View  01/28/2015  CLINICAL DATA:  Cough and hemoptysis EXAM: CHEST - 2 VIEW COMPARISON:  01/22/2015 FINDINGS: Cardiac shadow is mildly enlarged but stable. Aortic calcifications are again seen. Chronic interstitial changes are again seen throughout both lungs and stable. Small pleural effusions are noted. Mild increased  density is noted in the left mid lung which may represent some acute on chronic infiltrate. No other focal abnormality is seen. IMPRESSION: Chronic changes with findings suggestive of acute on chronic infiltrate in the left mid lung. Electronically Signed   By: Inez Catalina M.D.   On: 01/28/2015 18:14   [place] Core Biopsy  01/29/2015  Murlean Iba, MD     01/29/2015 11:57 AM PROCEDURE: Informed written consent was obtained from the patient after a discussion of the risks, benefits and alternatives to treatment. The patient understands and consents the procedure. A timeout was performed prior to the initiation of the procedure.  Ultrasound scanning was performed of the bilateral flanks. The inferior pole of the RIGHT kidney was selected for biopsy due to location and sonographic window. The procedure was planned. The operative site was prepped and draped in the usual sterile fashion. The overlying soft tissues were anesthetized with 10 mL of 1% XYLOCAINE.  A 18 gauge core needle biopsy device was advanced into the inferior cortex of the RIGHT kidney and 2 core biopsies were obtained under direct ultrasound guidance. Real time pathologic review confirmed adequate tissue acquisition. Images were saved for documentation purposes. The biopsy device was removed and hemostasis was obtained with manual compression. Post procedural scanning was negative for significant post procedural hemorrhage or additional Complication, although a small ooze was seen. A dressing was placed. The patient tolerated the procedure well. Small hematoma was noted.     Medications:   . anticoagulant sodium citrate     . arformoterol  15 mcg Nebulization BID  . feeding supplement (ENSURE ENLIVE)  237 mL Oral Q24H  . pantoprazole  40 mg Oral Daily  . vancomycin  750 mg Intravenous Q M,W,F-HD   acetaminophen **OR** acetaminophen, albuterol, ALPRAZolam, alteplase, anticoagulant sodium citrate, benzonatate, guaiFENesin-dextromethorphan,  lidocaine (PF), lidocaine-prilocaine, loperamide, nitroGLYCERIN, ondansetron **OR** ondansetron (ZOFRAN) IV, pentafluoroprop-tetrafluoroeth, polyethylene glycol  Assessment/ Plan:  81 y.o. female with past medical history of coronary disease- with coronary stents, Hypertension, Crohn's disease, COPD, uses home oxygen around the clock, Kidney stones- lithotripsy, ureteral stents, History of shingles, Arthritis, Hyperlipidemia, Partial resection of bowel for Crohn's disease, liver cirrhosis.   1.  Acute renal failure of undetermined etiology/CKD stage III.  The patient has had progressively worsening renal function since July of 2016.  EGFR was down to 6 as outpt most recently. Exact etiology of kidney disease unclear, had Bactrim in May. Has had rather fast renal function decline. - Serologic work up pending, etiology of kidney disease unclear but could be related to crohns/liver disease.    - Patient reports that she was seen by GI Specialist for liver disease back in Summer (Dr Timmothy Euler). It appears that the plan was observation at that time -  platelet transfusion as necessary - Renal biopsy was done yesterday. Results awaited - PermCath to be placed tomorrow. Consider platelet transfusion prior to PermCath placement if platelet count is still low  2.  Anemia of CKD/thrombocytopenia:  hgb 7.7 post procedure GI evaluation for hemoccult positive Pack  Dialysis cathter with citrate  3.  SHPTH of renal origin: follow up phos      LOS: 8 , 10/20/201612:26 PM

## 2015-01-30 NOTE — Care Management (Signed)
Spoke with patient who is alert oriented at this time. Up to chair and pleasant. Concerns over applying for Medicaid. Referred to social services but will see if someone here can assist in this prior to discharge. Contacted Kim at [employer] for assistance.  No PT evaluation at this time due to temp femoral cath. Will need PT evaluation to assess discharge needs. More to follow.

## 2015-01-30 NOTE — Progress Notes (Signed)
Pt's last CBC was drawn on 01/29/15- Hgb 7.7 & Platelets 68. No other orders for re-draw before perm cath placement tomorrow. Dr. Anne Hahn notified. New orders for CBC to be drawn tonight.

## 2015-01-30 NOTE — Consult Note (Signed)
[facility]  [address] [place], [place] [address] Phone: [phone] Fax : [phone]  Consultation  Referring Provider:     No ref. provider found Primary Care Physician:  [name], MD Primary Gastroenterologist:         Reason for Consultation:     Cirrhosis heme positive stools and a history of Crohn's disease  Date of Admission:  01/22/2015 Date of Consultation:  01/30/2015         HPI:   Brandi Cline is a 81 y.o. female Who was admitted with acute on chronic renal failure. The patient had a CT scan back in July with the findings of suggested cirrhosis. The patient had seen hematology in the past who thought that her thrombocytopenia was also due to her liver disease. The patient had chronic anemia and has a history of Crohn's disease. The patient reports that she has not seen G.I. For some time due to her primary gastritis retiring. She also reports that she was told that she does not need another colonoscopy. She states that she had part of her colon a small bowel removed because of her Crohn's disease and has had chronic diarrhea since. She does not report that diarrhea to be any worse now. She states it is the same as it has been in the past. Previous CT scan did not show cirrhosis prior to the one done in July. The patient denies any alcohol abuse or being told that she had liver disease in the past. The patient was found to have anemia and blood in her stool.  Past Medical History  Diagnosis Date  . Dyspnea   . Esophageal reflux   . CAD (coronary artery disease)   . Hypertension   . Crohn's disease (HCC)   . Allergic rhinitis   . COPD (chronic obstructive pulmonary disease) (HCC)   . History of kidney stones   . History of shingles   . Trigeminal neuralgia   . Family history of anesthesia complication     " SISTER HAD BAD FEELINGS "  . Anginal pain (HCC)   . Anxiety     ' JUST FOR MY BREATHING "  . Arthritis     mild in hands  .  Hyperlipidemia   . CKD (chronic kidney disease)     Past Surgical History  Procedure Laterality Date  . Coronary stents    . Lithotripsy    . Ureteral stent placement    . Cholecystectomy    . Total abdominal hysterectomy    . Partial resections large and small bowel for crohn's    . Breast lumps benign    . Left heart catheterization with coronary angiogram N/A 10/28/2011    Procedure: LEFT HEART CATHETERIZATION WITH CORONARY ANGIOGRAM;  Surgeon: Lesleigh Noe, MD;  Location: [facility] CATH LAB;  Service: Cardiovascular;  Laterality: N/A;    Prior to Admission medications   Medication Sig Start Date End Date Taking? Authorizing Provider  albuterol (PROVENTIL HFA;VENTOLIN HFA) 108 (90 BASE) MCG/ACT inhaler Inhale 2 puffs into the lungs every 6 (six) hours as needed for wheezing or shortness of breath. 10/08/14  Yes Waymon Budge, MD  albuterol (PROVENTIL) (2.5 MG/3ML) 0.083% nebulizer solution Take 3 mLs (2.5 mg total) by nebulization every 4 (four) hours as needed. Shortness of breath 10/03/14  Yes Waymon Budge, MD  ALPRAZolam Prudy Feeler) 0.5 MG tablet TAKE ONE TABLET BY MOUTH THREE TIMES DAILY AS NEEDED FOR ANXIETY 01/16/15  Yes Talia  Kelton Pillar, MD  arformoterol (BROVANA) 15 MCG/2ML NEBU Take 2 mLs (15 mcg total) by nebulization 2 (two) times daily. 08/14/14  Yes Waymon Budge, MD  BIOTIN 5000 PO Take 2 tablets by mouth daily.   Yes Historical Provider, MD  Cholecalciferol (VITAMIN D-3) 5000 UNITS TABS Take 1 tablet by mouth daily.   Yes Historical Provider, MD  isosorbide mononitrate (IMDUR) 30 MG 24 hr tablet TAKE TWO TABLETS BY MOUTH DAILY 08/13/14  Yes Lyn Records, MD  loratadine (CLARITIN) 10 MG tablet Take 10 mg by mouth daily as needed.  07/11/12  Yes Marden Noble, MD  magnesium oxide (MAG-OX) 400 MG tablet Take 400 mg by mouth daily.    Yes Historical Provider, MD  Multiple Vitamin (MULTIVITAMIN) tablet Take 1 tablet by mouth daily.    Yes Historical Provider, MD  nitroGLYCERIN  (NITROSTAT) 0.4 MG SL tablet Place 1 tablet (0.4 mg total) under the tongue every 5 (five) minutes as needed. Chest pain 04/27/13  Yes Lyn Records, MD  omeprazole (PRILOSEC) 20 MG capsule TAKE ONE CAPSULE BY MOUTH DAILY 12/03/14  Yes Dianne Dun, MD  Zinc Acetate, Oral, (ZINC ACETATE PO) Take 1 tablet by mouth daily.   Yes Historical Provider, MD    Family History  Problem Relation Age of Onset  . Breast cancer Sister   . COPD Sister   . Stroke Mother   . Prostate cancer Father   . Prostate cancer Brother   . Heart attack Brother      Social History  Substance Use Topics  . Smoking status: Former Smoker -- 1.00 packs/day for 30 years    Types: Cigarettes    Quit date: 04/12/1990  . Smokeless tobacco: Never Used  . Alcohol Use: No    Allergies as of 01/22/2015 - Review Complete 01/22/2015  Allergen Reaction Noted  . Avelox [moxifloxacin hcl in nacl] Shortness Of Breath, Swelling, and Rash 08/04/2011  . Codeine Shortness Of Breath and Other (See Comments) 10/20/2007  . Theophyllines  01/30/2013  . Ciprofloxacin Other (See Comments) 03/20/2013  . Milk-related compounds Other (See Comments) 10/05/2011  . Tape  01/10/2015  . Lisinopril Rash 03/10/2010    Review of Systems:    All systems reviewed and negative except where noted in HPI.   Physical Exam:  Vital signs in last 24 hours: Temp:  [97.9 F (36.6 C)-98.9 F (37.2 C)] 97.9 F (36.6 C) (10/20 1304) Pulse Rate:  [79-86] 86 (10/20 1304) Resp:  [20] 20 (10/20 1304) BP: (113-145)/(33-71) 128/71 mmHg (10/20 1304) SpO2:  [92 %-99 %] 96 % (10/20 1304) Weight:  [150 lb 3 oz (68.125 kg)] 150 lb 3 oz (68.125 kg) (10/20 0500) Last BM Date: 01/29/15 General:   Pleasant, cooperative in NAD Head:  Normocephalic and atraumatic. Eyes:   No icterus.   Conjunctiva pink. PERRLA. Ears:  Normal auditory acuity. Neck:  Supple; no masses or thyroidomegaly Lungs: Respirations even and unlabored. Lungs clear to auscultation  bilaterally.   No wheezes, crackles, or rhonchi.  Heart:  Regular rate and rhythm;  Without murmur, clicks, rubs or gallops Abdomen:  Soft, nondistended, nontender. Normal bowel sounds. No appreciable masses or hepatomegaly.  No rebound or guarding.  Rectal:  Not performed. Msk:  Symmetrical without gross deformities.  Strength  Extremities:  Without edema, cyanosis or clubbing. Neurologic:  Alert and oriented x3;  grossly normal neurologically. Skin:  Intact without significant lesions or rashes. Cervical Nodes:  No significant cervical adenopathy. Psych:  Alert and  cooperative. Normal affect.  LAB RESULTS:  Recent Labs  01/28/15 1526 01/28/15 2008 01/29/15 0603 01/29/15 1451  WBC 3.2* 4.2 3.1*  --   HGB 6.5* 8.2* 7.5* 7.7*  HCT 20.6* 25.5* 23.3*  --   PLT 72* 76* 68*  --    BMET  Recent Labs  01/27/15 2118 01/29/15 0603 01/30/15 0454  NA 140 141 141  K 3.8 3.6 3.9  CL 102 102 103  CO2 29 33* 33*  GLUCOSE 144* 82 104*  BUN 15 16 26*  CREATININE 2.38* 2.57* 3.53*  CALCIUM 8.1* 8.3* 8.4*   LFT  Recent Labs  01/29/15 0603  PROT 5.4*  ALBUMIN 2.9*  AST 48*  ALT 20  ALKPHOS 150*  BILITOT 2.8*   PT/INR  Recent Labs  01/28/15 2008 01/29/15 0603  LABPROT 15.9* 16.8*  INR 1.25 1.34    STUDIES: [place] Core Biopsy  01/29/2015  Mosetta Pigeon, MD     01/29/2015 11:57 AM PROCEDURE: Informed written consent was obtained from the patient after a discussion of the risks, benefits and alternatives to treatment. The patient understands and consents the procedure. A timeout was performed prior to the initiation of the procedure.  Ultrasound scanning was performed of the bilateral flanks. The inferior pole of the RIGHT kidney was selected for biopsy due to location and sonographic window. The procedure was planned. The operative site was prepped and draped in the usual sterile fashion. The overlying soft tissues were anesthetized with 10 mL of 1% XYLOCAINE.  A 18 gauge core  needle biopsy device was advanced into the inferior cortex of the RIGHT kidney and 2 core biopsies were obtained under direct ultrasound guidance. Real time pathologic review confirmed adequate tissue acquisition. Images were saved for documentation purposes. The biopsy device was removed and hemostasis was obtained with manual compression. Post procedural scanning was negative for significant post procedural hemorrhage or additional Complication, although a small ooze was seen. A dressing was placed. The patient tolerated the procedure well. Small hematoma was noted.      Impression / Plan:   Brandi Cline is a 81 y.o. y/o female with A history of Crohn's disease and now was found to have heme positive stools. The patient also was found to have cirrhosis on a CT scan with thrombocytopenia at her physicians office. It does not appear that the patient has been workup or seen for her cirrhosis since the finding on a recent CT scan. The patient has anemia with heme positive stools and would like everything to be investigated. The patient will be set up for an EGD and colonoscopy for Monday because she has multiple procedures plan for tomorrow including dialysis and she reports a replacement of her dialysis catheter. The patient has been explained the plan and agrees with it.   Thank you for involving me in the care of this patient.      LOS: 8 days   Darlina Rumpf, MD  01/30/2015, 7:14 PM   Note: This dictation was prepared with Dragon dictation along with smaller phrase technology. Any transcriptional errors that result from this process are unintentional.

## 2015-01-30 NOTE — Care Management Important Message (Signed)
Important Message  Patient Details  Name: Brandi Cline MRN: [id number] Date of Birth: 04/28/1930   Medicare Important Message Given:  Yes-fourth notification given    [name] 01/30/2015, 10:34 AM

## 2015-01-31 ENCOUNTER — Encounter
Admission: AD | Disposition: A | Discharge status: Skilled Nursing Facility | Payer: Self-pay | Source: Ambulatory Visit | Attending: Internal Medicine

## 2015-01-31 ENCOUNTER — Inpatient Hospital Stay: Payer: Medicare HMO

## 2015-01-31 HISTORY — PX: PERIPHERAL VASCULAR CATHETERIZATION: SHX172C

## 2015-01-31 LAB — CBC
HEMATOCRIT: 27.3 % — AB (ref 35.0–47.0)
HEMOGLOBIN: 8.7 g/dL — AB (ref 12.0–16.0)
MCH: 29.7 pg (ref 26.0–34.0)
MCHC: 32.1 g/dL (ref 32.0–36.0)
MCV: 92.6 fL (ref 80.0–100.0)
Platelets: 104 10*3/uL — ABNORMAL LOW (ref 150–440)
RBC: 2.94 MIL/uL — ABNORMAL LOW (ref 3.80–5.20)
RDW: 15 % — ABNORMAL HIGH (ref 11.5–14.5)
WBC: 5 10*3/uL (ref 3.6–11.0)

## 2015-01-31 LAB — PROTEIN ELECTRO, RANDOM URINE
ALPHA-2-GLOBULIN, U: 8.6 %
Albumin ELP, Urine: 46.4 %
Alpha-1-Globulin, U: 4.1 %
Beta Globulin, U: 27.7 %
Gamma Globulin, U: 13.1 %
TOTAL PROTEIN, URINE-UPE24: 112.9 mg/dL

## 2015-01-31 LAB — TYPE AND SCREEN
ABO/RH(D): A POS
Antibody Screen: NEGATIVE

## 2015-01-31 LAB — FERRITIN: FERRITIN: 40 ng/mL (ref 11–307)

## 2015-01-31 SURGERY — DIALYSIS/PERMA CATHETER INSERTION
Anesthesia: Moderate Sedation

## 2015-01-31 MED ORDER — MIDAZOLAM HCL 5 MG/5ML IJ SOLN
INTRAMUSCULAR | Status: AC
  Filled 2015-01-31: qty 5

## 2015-01-31 MED ORDER — DIPHENHYDRAMINE HCL 50 MG/ML IJ SOLN
INTRAMUSCULAR | Status: AC
  Administered 2015-01-31: 25 mg via INTRAVENOUS
  Filled 2015-01-31: qty 1

## 2015-01-31 MED ORDER — FENTANYL CITRATE (PF) 100 MCG/2ML IJ SOLN
INTRAMUSCULAR | Status: DC | PRN
  Administered 2015-01-31: 50 ug via INTRAVENOUS

## 2015-01-31 MED ORDER — SODIUM CHLORIDE 0.9 % IV SOLN
Freq: Once | INTRAVENOUS | Status: AC
  Administered 2015-01-31: 10:00:00 via INTRAVENOUS

## 2015-01-31 MED ORDER — FENTANYL CITRATE (PF) 100 MCG/2ML IJ SOLN
INTRAMUSCULAR | Status: AC
  Filled 2015-01-31: qty 2

## 2015-01-31 MED ORDER — DIPHENHYDRAMINE HCL 50 MG/ML IJ SOLN
25.0000 mg | Freq: Once | INTRAMUSCULAR | Status: AC
  Administered 2015-01-31 (×2): 25 mg via INTRAVENOUS
  Filled 2015-01-31: qty 1

## 2015-01-31 MED ORDER — HEPARIN SODIUM (PORCINE) 10000 UNIT/ML IJ SOLN
INTRAMUSCULAR | Status: AC
  Filled 2015-01-31: qty 1

## 2015-01-31 MED ORDER — HEPARIN SODIUM (PORCINE) 1000 UNIT/ML IJ SOLN
INTRAMUSCULAR | Status: AC
  Filled 2015-01-31: qty 1

## 2015-01-31 MED ORDER — LIDOCAINE-EPINEPHRINE (PF) 1 %-1:200000 IJ SOLN
INTRAMUSCULAR | Status: DC | PRN
  Administered 2015-01-31: 10 mL via INTRADERMAL

## 2015-01-31 MED ORDER — HEPARIN (PORCINE) IN NACL 2-0.9 UNIT/ML-% IJ SOLN
INTRAMUSCULAR | Status: AC
  Filled 2015-01-31: qty 500

## 2015-01-31 MED ORDER — LIDOCAINE-EPINEPHRINE (PF) 1 %-1:200000 IJ SOLN
INTRAMUSCULAR | Status: AC
  Filled 2015-01-31: qty 30

## 2015-01-31 MED ORDER — CEFAZOLIN SODIUM 1-5 GM-% IV SOLN
INTRAVENOUS | Status: AC
Start: 2015-01-31 — End: 2015-01-31
  Filled 2015-01-31: qty 50

## 2015-01-31 MED ORDER — MIDAZOLAM HCL 2 MG/2ML IJ SOLN
INTRAMUSCULAR | Status: DC | PRN
  Administered 2015-01-31: 2 mg via INTRAVENOUS

## 2015-01-31 MED ORDER — ACETAMINOPHEN 325 MG PO TABS
650.0000 mg | ORAL_TABLET | Freq: Once | ORAL | Status: AC
  Administered 2015-01-31: 650 mg via ORAL
  Filled 2015-01-31: qty 2

## 2015-01-31 SURGICAL SUPPLY — 10 items
ADH SKN CLS APL DERMABOND .7 (GAUZE/BANDAGES/DRESSINGS) ×1
BIOPATCH RED 1 DISK 7.0 (GAUZE/BANDAGES/DRESSINGS) ×1 IMPLANT
BIOPATCH RED 1IN DISK 7.0MM (GAUZE/BANDAGES/DRESSINGS) ×1
CATH PALINDROME RT-P 15FX19CM (CATHETERS) ×2 IMPLANT
DERMABOND ADVANCED (GAUZE/BANDAGES/DRESSINGS) ×2
DERMABOND ADVANCED .7 DNX12 (GAUZE/BANDAGES/DRESSINGS) IMPLANT
PACK ANGIOGRAPHY (CUSTOM PROCEDURE TRAY) ×2 IMPLANT
SUT MNCRL AB 4-0 PS2 18 (SUTURE) ×2 IMPLANT
SUT SILK 0 FSL (SUTURE) ×2 IMPLANT
TOWEL OR 17X26 4PK STRL BLUE (TOWEL DISPOSABLE) ×2 IMPLANT

## 2015-01-31 NOTE — Care Management (Signed)
Spoke with Sue Lush at [facility] .  Patient will not meet LTAC criteria.Anticipating the possibility of SNF placement at discharge. Continue to follow.

## 2015-01-31 NOTE — Progress Notes (Signed)
[facility] - [place] at [facility]   PATIENT NAME: Brandi Cline    MR#:  [id number]  DATE OF BIRTH:  November 18, 1930  SUBJECTIVE: Patient feels better today. No chest pain, no shortness of breath. For Permacath today.has cough and some hemoptysis  Yesterday.  CHIEF COMPLAINT:   REVIEW OF SYSTEMS:  CONSTITUTIONAL: No fever, fatigue or weakness.  EYES: No blurred or double vision.  EARS, NOSE, AND THROAT: No tinnitus or ear pain.  RESPIRATORY: No cough, shortness of breath, wheezing or hemoptysis.  CARDIOVASCULAR: No chest pain, orthopnea, edema.  GASTROINTESTINAL: Reports dark diarrhea.  No nausea, vomiting,  or abdominal pain.  GENITOURINARY: No dysuria, hematuria.  ENDOCRINE: No polyuria, nocturia,  HEMATOLOGY: No anemia, reports bruising . SKIN: No rash or lesion. Left leg  redness is  better MUSCULOSKELETAL: No joint pain or arthritis.   NEUROLOGIC: No tingling, numbness, weakness.  PSYCHIATRY: No anxiety or depression.   DRUG ALLERGIES:   Allergies  Allergen Reactions  . Avelox [Moxifloxacin Hcl In Nacl] Shortness Of Breath, Swelling and Rash  . Codeine Shortness Of Breath and Other (See Comments)    Couldn't breath  . Theophyllines     Slight increased heart rate  . Ciprofloxacin Other (See Comments)    nervous  . Milk-Related Compounds Other (See Comments)    Whole milk hurts stomach. Not all dairy products do this.  . Tape     Please use paper tape only  . Lisinopril Rash    VITALS:  Blood pressure 103/38, pulse 76, temperature 98.5 F (36.9 C), temperature source Oral, resp. rate 20, height  (1.626 m), weight 69.219 kg (152 lb 9.6 oz), SpO2 96 %.  PHYSICAL EXAMINATION:  GENERAL:  81 y.o.-year-old patient lying in the bed with no acute distress.  EYES: Pupils equal, round, reactive to light and accommodation. No scleral icterus. Extraocular muscles intact.  HEENT: Head atraumatic, normocephalic. Oropharynx and nasopharynx clear.   NECK:  Supple, no jugular venous distention. No thyroid enlargement, no tenderness.  LUNGS: Normal breath sounds bilaterally, no wheezing, rales,rhonchi or crepitation. No use of accessory muscles of respiration.  CARDIOVASCULAR: S1, S2 normal. No murmurs, rubs, or gallops.  ABDOMEN: Soft, nontender, nondistended. Bowel sounds present. No organomegaly or mass.  EXTREMITIES: No pedal edema, cyanosis, or clubbing. Temp dialysis catheter site is intact, no oozing noticed  NEUROLOGIC: Cranial nerves II through XII are intact. Muscle strength 5/5 in all extremities. Sensation intact. Gait not checked.  PSYCHIATRIC: The patient is alert and oriented x 3.  SKIN: No obvious rash, lesion, or ulcer. Left lower extremity with decreased  erythema and tenderness   LABORATORY PANEL:   CBC  Recent Labs Lab 01/31/15 0841  WBC 5.0  HGB 8.7*  HCT 27.3*  PLT 104*   ------------------------------------------------------------------------------------------------------------------  Chemistries   Recent Labs Lab 01/29/15 0603 01/30/15 0454  NA 141 141  K 3.6 3.9  CL 102 103  CO2 33* 33*  GLUCOSE 82 104*  BUN 16 26*  CREATININE 2.57* 3.53*  CALCIUM 8.3* 8.4*  AST 48*  --   ALT 20  --   ALKPHOS 150*  --   BILITOT 2.8*  --    ------------------------------------------------------------------------------------------------------------------  Cardiac Enzymes No results for input(s): TROPONINI in the last 168 hours. ------------------------------------------------------------------------------------------------------------------  RADIOLOGY:  No results found.  EKG:   Orders placed or performed during the hospital encounter of 09/19/13  . EKG 12-Lead  . EKG 12-Lead  . EKG    ASSESSMENT AND PLAN:   #  1 acute renal failure on CKD3 progressing  to ESRD ?-  With volume overload with edema and shortness of breath at the time of admission   SOB resolved with dialysis  Had a temporary  dialysis catheter placed in the right femoral vein by vascular surgery 10/12.pt to have  HD access today S/p renal biospy ,,preliminary result showing ATN FOR [facility] TODAY,may need to go to SNF ,does  Not qualify for [facility]   #  left lower extremity cellulitis:  Elevate the leg.   Cellulitis improved,  D/c vanco today  #  chronic respiratory failure on 3 L nasal cannula due to COPD due to smoking: Oxygenation is stable at this time.     #4 acute on chronic anemia:  Anemia, liver cirrhosis, Crohn's disease concerned about causes for cirrhosis thrombocytopenia ( [place])Patient has guaiac-positive stools will need GI workup , seen by GI ,FOR EGD and colonoscopy on monday   #5 GERD: protonix  #6 anxiety: continue xanax as she takes this at home.  #7 thrombocytopenia/ coagulopathy likely due to liver disease  And crohns disease Appreciate oncology recs Vit k sq given once 10/16 for coagulopathy Discontinued Lovenox. Discontinued heparin flushes and heparin given during dialysis.  F/u Hit panel and hepatitis labs  Hemoptysis;ct chest  With out contrast for evaluation of pneumonia?  CODE STATUS: DNR ,discussed with patient and daughter and sister at bedside on admission.  Dispo;to ALF when stable.   All the records are reviewed and case discussed with Care Management/Social Workerr. Management plans discussed with the patient, family and they are in agreement.    TOTAL TIME TAKING CARE OF THIS PATIENT: 35 minutes.   POSSIBLE D/C IN  2-3  DAYS, DEPENDING ON CLINICAL CONDITION.   Katha Hamming M.D on 01/31/2015 at 12:11 PM  Between 7am to 6pm - Pager - [phone] After 6pm go to [email or web address] - password [email or web address] [facility]  [facility] [facility]  Office  [phone]  CC: Primary care physician; [name], MD

## 2015-01-31 NOTE — Op Note (Signed)
OPERATIVE NOTE   PROCEDURE: 1. Insertion of tunneled dialysis catheter right IJ approach.  PRE-OPERATIVE DIAGNOSIS: End-stage renal disease requiring hemodialysis; thrombocytopenia  POST-OPERATIVE DIAGNOSIS: Same  SURGEON: Kahmari Koller, Latina Craver.  ANESTHESIA: Conscious sedation with 1% lidocaine local infiltration  ESTIMATED BLOOD LOSS: Minimal cc  CONTRAST USED:  None  FLUOROSCOPY TIME:    INDICATIONS:   Brandi Cline a 81 y.o. y.o. female who presents with end-stage renal disease she will now be starting dialysis and therefore requires a permacath.  DESCRIPTION: After obtaining full informed written consent, the patient was positioned supine. The right neck and chest wall was prepped and draped in a sterile fashion. Ultrasound was placed in a sterile sleeve. Ultrasound was utilized to identify the right internal jugular vein which is noted to be echolucent and compressible indicating patency. Images recorded for the permanent record. Under real-time visualization a Seldinger needle is inserted into the vein and the guidewires advanced without difficulty. Small counterincision was made at the wire insertion site. Dilators are passed over the wire and the tunneled dialysis catheter is fed into the central venous system without difficulty.  Under fluoroscopy the catheter tip positioned at the atrial caval junction. The catheter is then approximated to the chest wall and an exit site selected. 1% lidocaine is infiltrated in soft tissues at this level small incision is made and the tunneling device is then passed from the exit site to the neck counterincision. Catheter is then connected to the tunneling device and the catheter was pulled subcutaneously. It is then transected and the hub assembly connected without difficulty. Both lumens aspirate and flush easily. After verification of smooth contour with proper tip position under fluoroscopy the catheter is packed with 5000 units of heparin per  lumen.  Catheter secured to the skin of the right chest wall with 0 silk. A sterile dressing is applied with a Biopatch.  COMPLICATIONS: None  CONDITION: Good  Dinisha Cai, Latina Craver [place] [facility]. Office:  [phone]   01/31/2015,4:59 PM

## 2015-01-31 NOTE — Progress Notes (Signed)
Subjective:  Doing fair today No shortness of breath Preliminary biopsy report shows severe ATN GI evaluation ongoing. Upper and lower endoscopy planned on Monday  Objective:  Vital signs in last 24 hours:  Temp:  [97.9 F (36.6 C)-98.5 F (36.9 C)] 98.5 F (36.9 C) (10/21 0533) Pulse Rate:  [75-86] 76 (10/21 0533) Resp:  [20] 20 (10/20 1304) BP: (103-131)/(38-71) 114/48 mmHg (10/21 1143) SpO2:  [95 %-99 %] 95 % (10/21 0938) Weight:  [69.219 kg (152 lb 9.6 oz)] 69.219 kg (152 lb 9.6 oz) (10/21 0500)  Weight change: 1.094 kg (2 lb 6.6 oz) Filed Weights   01/29/15 0500 01/30/15 0500 01/31/15 0500  Weight: 69.673 kg (153 lb 9.6 oz) 68.125 kg (150 lb 3 oz) 69.219 kg (152 lb 9.6 oz)    Intake/Output: I/O last 3 completed shifts: In: 600 [P.O.:600] Out: 500 [Urine:500]   Intake/Output this shift:  Total I/O In: 100 [P.O.:100] Out: 150 [Urine:150]  Physical Exam: General: NAD,    Head: Normocephalic, atraumatic. Moist oral mucosal membranes  Eyes: Anicteric  Neck: Supple, trachea midline  Lungs:  Clear to auscultation normal effort  Heart: Regular rate and rhythm  Abdomen:  Soft, nontender, BS present  Extremities:  + peripheral edema.  Neurologic: Nonfocal, moving all four extremities  Skin: No lesions  Access: Right femoral temp HD catheter.    Basic Metabolic Panel:  Recent Labs Lab 01/27/15 0645 01/27/15 2118 01/29/15 0603 01/30/15 0454  NA 142 140 141 141  K 4.1 3.8 3.6 3.9  CL 104 102 102 103  CO2 28 29 33* 33*  GLUCOSE 101* 144* 82 104*  BUN 26* 15 16 26*  CREATININE 3.59* 2.38* 2.57* 3.53*  CALCIUM 8.9 8.1* 8.3* 8.4*    Liver Function Tests:  Recent Labs Lab 01/27/15 2118 01/29/15 0603  AST 53* 48*  ALT 16 20  ALKPHOS 161* 150*  BILITOT 2.3* 2.8*  PROT 5.5* 5.4*  ALBUMIN 2.7* 2.9*   No results for input(s): LIPASE, AMYLASE in the last 168 hours.  Recent Labs Lab 01/29/15 0603  AMMONIA 49*    CBC:  Recent Labs Lab  01/26/15 0658  01/28/15 1526 01/28/15 2008 01/29/15 0603 01/29/15 1451 01/30/15 2050 01/31/15 0841  WBC 3.5*  < > 3.2* 4.2 3.1*  --  6.1 5.0  NEUTROABS 2.5  --   --  3.1  --   --   --   --   HGB 7.4*  < > 6.5* 8.2* 7.5* 7.7* 8.2* 8.7*  HCT 22.7*  < > 20.6* 25.5* 23.3*  --  25.4* 27.3*  MCV 89.8  < > 91.1 90.9 90.9  --  91.7 92.6  PLT 50*  < > 72* 76* 68*  --  92* 104*  < > = values in this interval not displayed.  Cardiac Enzymes: No results for input(s): CKTOTAL, CKMB, CKMBINDEX, TROPONINI in the last 168 hours.  BNP: Invalid input(s): POCBNP  CBG: No results for input(s): GLUCAP in the last 168 hours.  Microbiology: Results for orders placed or performed during the hospital encounter of 01/22/15  C difficile quick scan w PCR reflex     Status: None   Collection Time: 01/23/15  4:46 PM  Result Value Ref Range Status   C Diff antigen NEGATIVE NEGATIVE Final   C Diff toxin NEGATIVE NEGATIVE Final   C Diff interpretation Negative for C. difficile  Final    Coagulation Studies:  Recent Labs  01/28/15 2008 01/29/15 0603  LABPROT 15.9* 16.8*  INR 1.25 1.34    Urinalysis: No results for input(s): COLORURINE, LABSPEC, PHURINE, GLUCOSEU, HGBUR, BILIRUBINUR, KETONESUR, PROTEINUR, UROBILINOGEN, NITRITE, LEUKOCYTESUR in the last 72 hours.  Invalid input(s): APPERANCEUR    Imaging: No results found.   Medications:   . anticoagulant sodium citrate     . arformoterol  15 mcg Nebulization BID  .  ceFAZolin (ANCEF) IV  1 g Intravenous On Call  . feeding supplement (ENSURE ENLIVE)  237 mL Oral Q24H  . pantoprazole  40 mg Oral Daily  . [START ON 02/02/2015] polyethylene glycol-electrolytes  4,000 mL Oral Once  . vancomycin  750 mg Intravenous Q M,W,F-HD   acetaminophen **OR** acetaminophen, albuterol, ALPRAZolam, alteplase, anticoagulant sodium citrate, benzonatate, guaiFENesin-dextromethorphan, lidocaine (PF), lidocaine-prilocaine, loperamide, nitroGLYCERIN,  ondansetron **OR** ondansetron (ZOFRAN) IV, pentafluoroprop-tetrafluoroeth, polyethylene glycol  Assessment/ Plan:  81 y.o. female with past medical history of coronary disease- with coronary stents, Hypertension, Crohn's disease, COPD, uses home oxygen around the clock, Kidney stones- lithotripsy, ureteral stents, History of shingles, Arthritis, Hyperlipidemia, Partial resection of bowel for Crohn's disease, liver cirrhosis.   1.  Acute renal failure of undetermined etiology/CKD stage III.  The patient has had progressively worsening renal function since July of 2016.  EGFR was down to 6 as outpt most recently. - Preliminary renal biopsy report show severe ATN - Patient will need intermittent dialysis based on labs and clinical status.  - PermCath placement today. May DC temporary catheter Electrolytes and Volume status are acceptable No acute indication for Dialysis at present   2.  Anemia of CKD/thrombocytopenia:  hgb 8.7   GI evaluation for hemoccult positive - endoscopy is planned for Monday Pack Dialysis cathter with citrate  3.  SHPTH of renal origin: follow up phos      LOS: 9 Annalysia Willenbring 10/21/201612:50 PM

## 2015-01-31 NOTE — Progress Notes (Signed)
Nutrition Follow-up    INTERVENTION:  Meals and snacks: Cater to pt preferences once back on po diet Medical Nutrition Supplement Therapy: continue ensure q daily for added nutrition   NUTRITION DIAGNOSIS:   Inadequate oral intake related to acute illness as evidenced by per patient/family report    GOAL:   Patient will meet greater than or equal to 90% of their needs  Progressing towards meeting nutritional goals  MONITOR:    (Energy Intake, Electrolyte and renal Profile, anthropometrics, Digestive System)  REASON FOR ASSESSMENT:   Diagnosis    ASSESSMENT:   Permacath placed today for HD.  Planning EGD and colonscopy on Monday   Current Nutrition: NPO Noted per I and O sheet intake 100%, 75%, bites noted. Loves ensure   Gastrointestinal Profile: Last BM: 10/19   Scheduled Medications:  . arformoterol  15 mcg Nebulization BID  . feeding supplement (ENSURE ENLIVE)  237 mL Oral Q24H  . pantoprazole  40 mg Oral Daily  . [START ON 02/02/2015] polyethylene glycol-electrolytes  4,000 mL Oral Once  . vancomycin  750 mg Intravenous Q M,W,F-HD    Continuous Medications:  . anticoagulant sodium citrate       Electrolyte/Renal Profile and Glucose Profile:   Recent Labs Lab 01/27/15 2118 01/29/15 0603 01/30/15 0454  NA 140 141 141  K 3.8 3.6 3.9  CL 102 102 103  CO2 29 33* 33*  BUN 15 16 26*  CREATININE 2.38* 2.57* 3.53*  CALCIUM 8.1* 8.3* 8.4*  GLUCOSE 144* 82 104*   Protein Profile:  Recent Labs Lab 01/27/15 2118 01/29/15 0603  ALBUMIN 2.7* 2.9*      Weight Trend since Admission: Filed Weights   01/29/15 0500 01/30/15 0500 01/31/15 0500  Weight: 153 lb 9.6 oz (69.673 kg) 150 lb 3 oz (68.125 kg) 152 lb 9.6 oz (69.219 kg)      Diet Order:  Diet NPO time specified Except for: Sips with Meds Diet clear liquid Room service appropriate?: Yes; Fluid consistency:: Thin  Skin:   reviewed   Height:   Ht Readings from Last 1 Encounters:   01/22/15 5\' 4"  (1.626 m)    Weight:   Wt Readings from Last 1 Encounters:  01/31/15 152 lb 9.6 oz (69.219 kg)    Ideal Body Weight:     BMI:  Body mass index is 26.18 kg/(m^2).  Estimated Nutritional Needs:   Kcal:  BEE: 1165kcals, TEE: (IF 1.1-1.3)(AF 1.2) 1538-1817kcals  Protein:  79-93g protein (1.1-1.3g/kg)  Fluid:  UOP+1L  EDUCATION NEEDS:   Education needs no appropriate at this time  LOW Care Level  Nimco Bivens B. Freida Busman, RD, LDN [phone] (pager)

## 2015-02-01 LAB — CBC
HCT: 27.8 % — ABNORMAL LOW (ref 35.0–47.0)
Hemoglobin: 8.9 g/dL — ABNORMAL LOW (ref 12.0–16.0)
MCH: 29.7 pg (ref 26.0–34.0)
MCHC: 32.2 g/dL (ref 32.0–36.0)
MCV: 92.2 fL (ref 80.0–100.0)
PLATELETS: 116 10*3/uL — AB (ref 150–440)
RBC: 3.02 MIL/uL — AB (ref 3.80–5.20)
RDW: 15.4 % — AB (ref 11.5–14.5)
WBC: 4.9 10*3/uL (ref 3.6–11.0)

## 2015-02-01 LAB — BASIC METABOLIC PANEL
ANION GAP: 11 (ref 5–15)
BUN: 48 mg/dL — ABNORMAL HIGH (ref 6–20)
CALCIUM: 8.9 mg/dL (ref 8.9–10.3)
CO2: 31 mmol/L (ref 22–32)
CREATININE: 5.18 mg/dL — AB (ref 0.44–1.00)
Chloride: 96 mmol/L — ABNORMAL LOW (ref 101–111)
GFR calc non Af Amer: 7 mL/min — ABNORMAL LOW (ref >60)
GFR, EST AFRICAN AMERICAN: 8 mL/min — AB (ref >60)
GLUCOSE: 117 mg/dL — AB (ref 65–99)
POTASSIUM: 4 mmol/L (ref 3.5–5.1)
Sodium: 138 mmol/L (ref 135–145)

## 2015-02-01 LAB — PREPARE PLATELET PHERESIS: Unit division: 0

## 2015-02-01 MED ORDER — BENZONATATE 100 MG PO CAPS
100.0000 mg | ORAL_CAPSULE | Freq: Four times a day (QID) | ORAL | Status: DC | PRN
  Administered 2015-02-01 – 2015-02-05 (×10): 100 mg via ORAL
  Filled 2015-02-01 (×10): qty 1

## 2015-02-01 MED ORDER — FUROSEMIDE 40 MG PO TABS
40.0000 mg | ORAL_TABLET | Freq: Once | ORAL | Status: AC
  Administered 2015-02-01: 40 mg via ORAL
  Filled 2015-02-01: qty 1

## 2015-02-01 MED ORDER — PEG 3350-KCL-NA BICARB-NACL 420 G PO SOLR
4000.0000 mL | Freq: Once | ORAL | Status: AC
  Administered 2015-02-02: 4000 mL via ORAL
  Filled 2015-02-01 (×2): qty 4000

## 2015-02-01 NOTE — Progress Notes (Signed)
[facility] [place] at [facility]   PATIENT NAME: Brandi Cline    MR#:  [id number]  DATE OF BIRTH:  1930/07/24    CHIEF COMPLAINT: SOB   Today her SOB is improved but still present. Cough. Orthopnea. Tunnel HD cath placed 01/31/2015  REVIEW OF SYSTEMS:  CONSTITUTIONAL: No fever, fatigue or weakness.  EYES: No blurred or double vision.  EARS, NOSE, AND THROAT: No tinnitus or ear pain.  RESPIRATORY: No cough, shortness of breath, wheezing or hemoptysis.  CARDIOVASCULAR: No chest pain, orthopnea, edema.  GASTROINTESTINAL: Reports dark diarrhea.  No nausea, vomiting,  or abdominal pain.  GENITOURINARY: No dysuria, hematuria.  ENDOCRINE: No polyuria, nocturia,  HEMATOLOGY: No anemia, reports bruising . SKIN: No rash or lesion. Left leg  redness is  better MUSCULOSKELETAL: No joint pain or arthritis.   NEUROLOGIC: No tingling, numbness, weakness.  PSYCHIATRY: No anxiety or depression.   DRUG ALLERGIES:   Allergies  Allergen Reactions  . Avelox [Moxifloxacin Hcl In Nacl] Shortness Of Breath, Swelling and Rash  . Codeine Shortness Of Breath and Other (See Comments)    Couldn't breath  . Theophyllines     Slight increased heart rate  . Ciprofloxacin Other (See Comments)    nervous  . Milk-Related Compounds Other (See Comments)    Whole milk hurts stomach. Not all dairy products do this.  . Tape     Please use paper tape only  . Lisinopril Rash    VITALS:  Blood pressure 124/37, pulse 79, temperature 98.5 F (36.9 C), temperature source Oral, resp. rate 20, height 5\' 4"  (1.626 m), weight 61.236 kg (135 lb), SpO2 96 %.  PHYSICAL EXAMINATION:  GENERAL:  81 y.o.-year-old patient lying in the bed with no acute distress.  EYES: Pupils equal, round, reactive to light and accommodation. No scleral icterus. Extraocular muscles intact.  HEENT: Head atraumatic, normocephalic. Oropharynx and nasopharynx clear.  NECK:  Supple, no jugular venous distention.  No thyroid enlargement, no tenderness.  LUNGS: B/L crackles CARDIOVASCULAR: S1, S2 normal. No murmurs, rubs, or gallops.  ABDOMEN: Soft, nontender, nondistended. Bowel sounds present. No organomegaly or mass.  EXTREMITIES: No  cyanosis, or clubbing. Temp dialysis catheter site is intact, no oozing noticed. LE edema 2+ NEUROLOGIC: Cranial nerves II through XII are intact. Muscle strength 5/5 in all extremities. Sensation intact. Gait not checked.  PSYCHIATRIC: The patient is alert and oriented x 3.  SKIN: No obvious rash, lesion, or ulcer. Left lower extremity with decreased  erythema and tenderness   LABORATORY PANEL:   CBC  Recent Labs Lab 01/31/15 0841  WBC 5.0  HGB 8.7*  HCT 27.3*  PLT 104*   ------------------------------------------------------------------------------------------------------------------  Chemistries   Recent Labs Lab 01/29/15 0603 01/30/15 0454  NA 141 141  K 3.6 3.9  CL 102 103  CO2 33* 33*  GLUCOSE 82 104*  BUN 16 26*  CREATININE 2.57* 3.53*  CALCIUM 8.3* 8.4*  AST 48*  --   ALT 20  --   ALKPHOS 150*  --   BILITOT 2.8*  --    ------------------------------------------------------------------------------------------------------------------  Cardiac Enzymes No results for input(s): TROPONINI in the last 168 hours. ------------------------------------------------------------------------------------------------------------------  RADIOLOGY:  Ct Chest Wo Contrast  01/31/2015  CLINICAL DATA:  Persistent cough unresponsive to therapy. History of COPD, coronary artery disease, Crohn's disease and esophageal reflux subsequent encounter. EXAM: CT CHEST WITHOUT CONTRAST TECHNIQUE: Multidetector CT imaging of the chest was performed following the standard protocol without IV contrast. COMPARISON:  CTs 05/13/2012 and  09/19/2013.  Radiographs 01/28/2015. FINDINGS: Mediastinum/Nodes: Right IJ hemodialysis catheters extend to the SVC right atrial junction.  There is some soft tissue emphysema in the right supraclavicular area at the level of the catheter insertion. There is no pneumothorax. Dilatation of the right internal jugular, brachiocephalic veins and SVC is similar to the prior study. No definite mediastinal hematoma. There is central enlargement of the pulmonary arteries consistent with pulmonary arterial hypertension. There is diffuse atherosclerosis of the aorta, great vessels and coronary arteries.The heart size is stable. There is a small pericardial effusion. Allowing for noncontrast technique, no enlarged mediastinal, hilar or axillary lymph nodes identified. Lungs/Pleura: There are new moderate size dependent pleural effusions bilaterally. There are associated dependent airspace opacities which are most consistent with atelectasis. Diffuse changes of moderate underlying emphysema are again noted. The interstitial prominence has mildly increased, suggesting superimposed edema. There is an 11 mm polypoid filling defect within the right mainstem bronchus on image number 24 which appears new. There is no lobar collapse. Upper abdomen: The visualized upper abdomen is again notable for hepatic cirrhosis and splenomegaly. There is increased ascites. The adrenal glands are incompletely visualized. Musculoskeletal/Chest wall: There is no chest wall mass or suspicious osseous finding. Chronic superior endplate compression deformity at T11 appears unchanged. IMPRESSION: 1. New moderate pleural effusions bilaterally with associated dependent airspace opacities in both lung bases, most consistent with atelectasis. Pneumonia not excluded. 2. Possible polypoid filling defect in the right mainstem bronchus versus mucus. No resulting lobar collapse. Recommend follow-up CT after resolution of the acute illness or bronchoscopy to exclude neoplasm. 3. Advanced emphysema with possible mild superimposed edema. 4. Stable cardiomegaly, diffuse atherosclerosis and central  dilatation of the pulmonary arteries consistent with pulmonary arterial hypertension. 5. Cirrhosis, portal hypertension and increased ascites in the upper abdomen. Electronically Signed   By: Carey Bullocks M.D.   On: 01/31/2015 17:02    EKG:   Orders placed or performed during the hospital encounter of 09/19/13  . EKG 12-Lead  . EKG 12-Lead  . EKG    ASSESSMENT AND PLAN:   # Acute renal failure on CKD3  Renal biopsy prelim - ATN With volume overload with edema and shortness of breath at the time of admission  Improved but still has crackles on exam and pedal edema. CT chest showed bilateral pleural effusions Tunnel HD catheter placed 10/21   #  Left lower extremity cellulitis:  Elevate the leg.   Cellulitis improved.  #  Chronic respiratory failure on 3 L nasal cannula due to COPD due to smoking Oxygenation is stable at this time.     # Acute on chronic anemia:  Anemia, liver cirrhosis, Crohn's disease concerned about causes for cirrhosis thrombocytopenia ( [place])Patient has guaiac-positive stools will need GI workup  EGD and colonoscopy on monday   # Anxiety: continue xanax as she takes this at home.  # Thrombocytopenia/ coagulopathy likely due to liver disease  And crohns disease Appreciate oncology recs Vit k sq given once 10/16 for coagulopathy Discontinued Lovenox. Discontinued heparin flushes and heparin given during dialysis.  F/u Hit panel and hepatitis labs  CODE STATUS: DNR ,discussed with patient and daughter and sister at bedside on admission.  Dispo - to ALF when stable.   All the records are reviewed and case discussed with Care Management/Social Workerr. Management plans discussed with the patient, family and they are in agreement.   TOTAL TIME TAKING CARE OF THIS PATIENT: 35 minutes.   POSSIBLE D/C IN  2-3  DAYS, DEPENDING ON CLINICAL CONDITION.   Milagros Loll R M.D on 02/01/2015 at 11:56 AM  Between 7am to 6pm - Pager -  [phone] After 6pm go to [email or web address] - password [email or web address] [facility]  [place]  [facility]  Office  [phone]  CC: Primary care physician; [name], MD

## 2015-02-01 NOTE — Progress Notes (Signed)
Subjective:  Doing fair today, No shortness of breath Preliminary biopsy report shows severe ATN GI evaluation ongoing. Upper and lower endoscopy planned on Monday CT of the chest shows moderate pleural effusions bilaterally, atelectasis, filling defect in the right main stem bronchus versus mucous, advanced emphysema, cardiomegaly, diffuse atherosclerosis, pulmonary hypertension, cirrhosis, portal hypertension, ascites  Objective:  Vital signs in last 24 hours:  Temp:  [98.5 F (36.9 C)-98.7 F (37.1 C)] 98.5 F (36.9 C) (10/22 0624) Pulse Rate:  [66-83] 79 (10/22 0643) Resp:  [15-20] 20 (10/22 0624) BP: (73-124)/(29-53) 124/37 mmHg (10/22 0643) SpO2:  [93 %-98 %] 96 % (10/22 0624) Weight:  [61.236 kg (135 lb)] 61.236 kg (135 lb) (10/22 0500)  Weight change: -7.983 kg (-17 lb 9.6 oz) Filed Weights   01/30/15 0500 01/31/15 0500 02/01/15 0500  Weight: 68.125 kg (150 lb 3 oz) 69.219 kg (152 lb 9.6 oz) 61.236 kg (135 lb)    Intake/Output: I/O last 3 completed shifts: In: 930 [P.O.:680; Blood:250] Out: 650 [Urine:650]   Intake/Output this shift:     Physical Exam: General: NAD,    Head: Normocephalic, atraumatic. Moist oral mucosal membranes  Eyes: Anicteric  Neck: Supple, trachea midline  Lungs:  Clear to auscultation normal effort  Heart: Regular rate and rhythm  Abdomen:  Soft, nontender, BS present, distended   Extremities:  + peripheral edema.  Neurologic: Nonfocal, moving all four extremities  Skin: No lesions  Access: Right IJ dialysis catheter.    Basic Metabolic Panel:  Recent Labs Lab 01/27/15 0645 01/27/15 2118 01/29/15 0603 01/30/15 0454  NA 142 140 141 141  K 4.1 3.8 3.6 3.9  CL 104 102 102 103  CO2 28 29 33* 33*  GLUCOSE 101* 144* 82 104*  BUN 26* 15 16 26*  CREATININE 3.59* 2.38* 2.57* 3.53*  CALCIUM 8.9 8.1* 8.3* 8.4*    Liver Function Tests:  Recent Labs Lab 01/27/15 2118 01/29/15 0603  AST 53* 48*  ALT 16 20  ALKPHOS 161* 150*   BILITOT 2.3* 2.8*  PROT 5.5* 5.4*  ALBUMIN 2.7* 2.9*   No results for input(s): LIPASE, AMYLASE in the last 168 hours.  Recent Labs Lab 01/29/15 0603  AMMONIA 49*    CBC:  Recent Labs Lab 01/26/15 0658  01/28/15 1526 01/28/15 2008 01/29/15 0603 01/29/15 1451 01/30/15 2050 01/31/15 0841  WBC 3.5*  < > 3.2* 4.2 3.1*  --  6.1 5.0  NEUTROABS 2.5  --   --  3.1  --   --   --   --   HGB 7.4*  < > 6.5* 8.2* 7.5* 7.7* 8.2* 8.7*  HCT 22.7*  < > 20.6* 25.5* 23.3*  --  25.4* 27.3*  MCV 89.8  < > 91.1 90.9 90.9  --  91.7 92.6  PLT 50*  < > 72* 76* 68*  --  92* 104*  < > = values in this interval not displayed.  Cardiac Enzymes: No results for input(s): CKTOTAL, CKMB, CKMBINDEX, TROPONINI in the last 168 hours.  BNP: Invalid input(s): POCBNP  CBG: No results for input(s): GLUCAP in the last 168 hours.  Microbiology: Results for orders placed or performed during the hospital encounter of 01/22/15  C difficile quick scan w PCR reflex     Status: None   Collection Time: 01/23/15  4:46 PM  Result Value Ref Range Status   C Diff antigen NEGATIVE NEGATIVE Final   C Diff toxin NEGATIVE NEGATIVE Final   C Diff interpretation Negative for C.  difficile  Final    Coagulation Studies: No results for input(s): LABPROT, INR in the last 72 hours.  Urinalysis: No results for input(s): COLORURINE, LABSPEC, PHURINE, GLUCOSEU, HGBUR, BILIRUBINUR, KETONESUR, PROTEINUR, UROBILINOGEN, NITRITE, LEUKOCYTESUR in the last 72 hours.  Invalid input(s): APPERANCEUR    Imaging: Ct Chest Wo Contrast  01/31/2015  CLINICAL DATA:  Persistent cough unresponsive to therapy. History of COPD, coronary artery disease, Crohn's disease and esophageal reflux subsequent encounter. EXAM: CT CHEST WITHOUT CONTRAST TECHNIQUE: Multidetector CT imaging of the chest was performed following the standard protocol without IV contrast. COMPARISON:  CTs 05/13/2012 and 09/19/2013.  Radiographs 01/28/2015. FINDINGS:  Mediastinum/Nodes: Right IJ hemodialysis catheters extend to the SVC right atrial junction. There is some soft tissue emphysema in the right supraclavicular area at the level of the catheter insertion. There is no pneumothorax. Dilatation of the right internal jugular, brachiocephalic veins and SVC is similar to the prior study. No definite mediastinal hematoma. There is central enlargement of the pulmonary arteries consistent with pulmonary arterial hypertension. There is diffuse atherosclerosis of the aorta, great vessels and coronary arteries.The heart size is stable. There is a small pericardial effusion. Allowing for noncontrast technique, no enlarged mediastinal, hilar or axillary lymph nodes identified. Lungs/Pleura: There are new moderate size dependent pleural effusions bilaterally. There are associated dependent airspace opacities which are most consistent with atelectasis. Diffuse changes of moderate underlying emphysema are again noted. The interstitial prominence has mildly increased, suggesting superimposed edema. There is an 11 mm polypoid filling defect within the right mainstem bronchus on image number 24 which appears new. There is no lobar collapse. Upper abdomen: The visualized upper abdomen is again notable for hepatic cirrhosis and splenomegaly. There is increased ascites. The adrenal glands are incompletely visualized. Musculoskeletal/Chest wall: There is no chest wall mass or suspicious osseous finding. Chronic superior endplate compression deformity at T11 appears unchanged. IMPRESSION: 1. New moderate pleural effusions bilaterally with associated dependent airspace opacities in both lung bases, most consistent with atelectasis. Pneumonia not excluded. 2. Possible polypoid filling defect in the right mainstem bronchus versus mucus. No resulting lobar collapse. Recommend follow-up CT after resolution of the acute illness or bronchoscopy to exclude neoplasm. 3. Advanced emphysema with possible  mild superimposed edema. 4. Stable cardiomegaly, diffuse atherosclerosis and central dilatation of the pulmonary arteries consistent with pulmonary arterial hypertension. 5. Cirrhosis, portal hypertension and increased ascites in the upper abdomen. Electronically Signed   By: Richardean Sale M.D.   On: 01/31/2015 17:02     Medications:   . anticoagulant sodium citrate     . arformoterol  15 mcg Nebulization BID  . feeding supplement (ENSURE ENLIVE)  237 mL Oral Q24H  . pantoprazole  40 mg Oral Daily  . [START ON 02/02/2015] polyethylene glycol-electrolytes  4,000 mL Oral Once   acetaminophen **OR** acetaminophen, albuterol, ALPRAZolam, alteplase, anticoagulant sodium citrate, benzonatate, guaiFENesin-dextromethorphan, lidocaine (PF), lidocaine-prilocaine, loperamide, nitroGLYCERIN, ondansetron **OR** ondansetron (ZOFRAN) IV, pentafluoroprop-tetrafluoroeth, polyethylene glycol  Assessment/ Plan:  81 y.o. female with past medical history of coronary disease- with coronary stents, Hypertension, Crohn's disease, COPD, uses home oxygen around the clock, Kidney stones- lithotripsy, ureteral stents, History of shingles, Arthritis, Hyperlipidemia, Partial resection of bowel for Crohn's disease, liver cirrhosis.   1.  Acute renal failure of undetermined etiology/CKD stage III.  The patient has had progressively worsening renal function since July of 2016.  EGFR was down to 6 as outpt most recently. - Preliminary renal biopsy report show severe ATN and oxalate crystals - Patient will need  intermittent dialysis based on labs and clinical status.  - PermCath placed on Friday.  Electrolytes and Volume status are acceptable No acute indication for Dialysis at present unless today's labs are significantly abnormal  2.  Anemia of CKD/thrombocytopenia:  hgb 8.7   GI evaluation for hemoccult positive - endoscopy is planned for Monday Pack Dialysis cathter with citrate  3.  SHPTH of renal origin: follow up  phos    4. Edema - agree with small dose of lasix    LOS: 10 Brandi Cline 10/22/20161:04 PM

## 2015-02-02 DIAGNOSIS — J432 Centrilobular emphysema: Secondary | ICD-10-CM

## 2015-02-02 DIAGNOSIS — J9611 Chronic respiratory failure with hypoxia: Secondary | ICD-10-CM

## 2015-02-02 DIAGNOSIS — J984 Other disorders of lung: Secondary | ICD-10-CM

## 2015-02-02 LAB — BASIC METABOLIC PANEL
Anion gap: 9 (ref 5–15)
BUN: 50 mg/dL — AB (ref 6–20)
CHLORIDE: 96 mmol/L — AB (ref 101–111)
CO2: 32 mmol/L (ref 22–32)
CREATININE: 5.42 mg/dL — AB (ref 0.44–1.00)
Calcium: 8.4 mg/dL — ABNORMAL LOW (ref 8.9–10.3)
GFR calc Af Amer: 8 mL/min — ABNORMAL LOW (ref >60)
GFR calc non Af Amer: 7 mL/min — ABNORMAL LOW (ref >60)
GLUCOSE: 94 mg/dL (ref 65–99)
Potassium: 4 mmol/L (ref 3.5–5.1)
Sodium: 137 mmol/L (ref 135–145)

## 2015-02-02 LAB — CBC WITH DIFFERENTIAL/PLATELET
Basophils Absolute: 0 10*3/uL (ref 0–0.1)
Basophils Relative: 1 %
EOS ABS: 0.1 10*3/uL (ref 0–0.7)
HCT: 23.7 % — ABNORMAL LOW (ref 35.0–47.0)
HEMOGLOBIN: 7.6 g/dL — AB (ref 12.0–16.0)
LYMPHS ABS: 0.7 10*3/uL — AB (ref 1.0–3.6)
Lymphocytes Relative: 24 %
MCH: 29.3 pg (ref 26.0–34.0)
MCHC: 32 g/dL (ref 32.0–36.0)
MCV: 91.5 fL (ref 80.0–100.0)
MONO ABS: 0.5 10*3/uL (ref 0.2–0.9)
Neutro Abs: 1.7 10*3/uL (ref 1.4–6.5)
Platelets: 93 10*3/uL — ABNORMAL LOW (ref 150–440)
RBC: 2.59 MIL/uL — ABNORMAL LOW (ref 3.80–5.20)
RDW: 15 % — ABNORMAL HIGH (ref 11.5–14.5)
WBC: 3.1 10*3/uL — ABNORMAL LOW (ref 3.6–11.0)

## 2015-02-02 LAB — OCCULT BLOOD X 1 CARD TO LAB, STOOL
FECAL OCCULT BLD: NEGATIVE
Fecal Occult Bld: NEGATIVE

## 2015-02-02 MED ORDER — EPOETIN ALFA 20000 UNIT/ML IJ SOLN
20000.0000 [IU] | INTRAMUSCULAR | Status: DC
  Filled 2015-02-02: qty 1

## 2015-02-02 MED ORDER — FUROSEMIDE 10 MG/ML IJ SOLN
40.0000 mg | Freq: Four times a day (QID) | INTRAMUSCULAR | Status: AC
  Administered 2015-02-02 (×2): 40 mg via INTRAVENOUS
  Filled 2015-02-02 (×2): qty 4

## 2015-02-02 MED ORDER — EPOETIN ALFA 10000 UNIT/ML IJ SOLN: 20000.0000 [IU] | Freq: Once | INTRAMUSCULAR | Status: DC

## 2015-02-02 MED ORDER — EPOETIN ALFA 10000 UNIT/ML IJ SOLN
20000.0000 [IU] | INTRAMUSCULAR | Status: DC
  Administered 2015-02-02: 20000 [IU] via SUBCUTANEOUS
  Filled 2015-02-02: qty 2

## 2015-02-02 NOTE — Progress Notes (Addendum)
[facility] [place] at [facility]   PATIENT NAME: Brandi Cline    MR#:  [id number]  DATE OF BIRTH:  [birth year]    CHIEF COMPLAINT: SOB   Still has some SOB. Hemoptysis - [employer]. Edema present. Tunnel HD cath placed 01/31/2015 On 3 L O2 at home EGD/Colonoscopy on Monday  REVIEW OF SYSTEMS:  CONSTITUTIONAL: No fever, fatigue or weakness.  EYES: No blurred or double vision.  EARS, NOSE, AND THROAT: No tinnitus or ear pain.  RESPIRATORY: Positive SOB/hemoptysis CARDIOVASCULAR: No chest pain, orthopnea, edema.  GASTROINTESTINAL: Reports dark diarrhea.  No nausea, vomiting,  or abdominal pain.  GENITOURINARY: No dysuria, hematuria.  ENDOCRINE: No polyuria, nocturia,  HEMATOLOGY: No anemia, reports bruising . SKIN: No rash or lesion. Left leg  redness is  better MUSCULOSKELETAL: No joint pain or arthritis.   NEUROLOGIC: No tingling, numbness, weakness.  PSYCHIATRY: No anxiety or depression.   DRUG ALLERGIES:   Allergies  Allergen Reactions  . Avelox [Moxifloxacin Hcl In Nacl] Shortness Of Breath, Swelling and Rash  . Codeine Shortness Of Breath and Other (See Comments)    Couldn't breath  . Theophyllines     Slight increased heart rate  . Ciprofloxacin Other (See Comments)    nervous  . Milk-Related Compounds Other (See Comments)    Whole milk hurts stomach. Not all dairy products do this.  . Tape     Please use paper tape only  . Lisinopril Rash    VITALS:  Blood pressure 115/53, pulse 72, temperature 98 F (36.7 C), temperature source Oral, resp. rate 20, height 5\' 4"  (1.626 m), weight 72.439 kg (159 lb 11.2 oz), SpO2 100 %.  PHYSICAL EXAMINATION:  GENERAL:  81 y.o.-year-old patient lying in the bed with no acute distress.  EYES: Pupils equal, round, reactive to light and accommodation. No scleral icterus. Extraocular muscles intact.  HEENT: Head atraumatic, normocephalic. Oropharynx and nasopharynx clear.  NECK:  Supple, no  jugular venous distention. No thyroid enlargement, no tenderness.  LUNGS: B/L crackles CARDIOVASCULAR: S1, S2 normal. No murmurs, rubs, or gallops.  ABDOMEN: Soft, nontender, nondistended. Bowel sounds present. No organomegaly or mass.  EXTREMITIES: No  cyanosis, or clubbing. Temp dialysis catheter site is intact, no oozing noticed. LE edema 2+ NEUROLOGIC: Cranial nerves II through XII are intact. Muscle strength 5/5 in all extremities. Sensation intact. Gait not checked.  PSYCHIATRIC: The patient is alert and oriented x 3.  SKIN: No obvious rash, lesion, or ulcer. Left lower extremity with decreased  erythema and tenderness   LABORATORY PANEL:   CBC  Recent Labs Lab 02/02/15 0732  WBC 3.1*  HGB 7.6*  HCT 23.7*  PLT 93*   ------------------------------------------------------------------------------------------------------------------  Chemistries   Recent Labs Lab 01/29/15 0603  02/02/15 0732  NA 141  < > 137  K 3.6  < > 4.0  CL 102  < > 96*  CO2 33*  < > 32  GLUCOSE 82  < > 94  BUN 16  < > 50*  CREATININE 2.57*  < > 5.42*  CALCIUM 8.3*  < > 8.4*  AST 48*  --   --   ALT 20  --   --   ALKPHOS 150*  --   --   BILITOT 2.8*  --   --   < > = values in this interval not displayed. ------------------------------------------------------------------------------------------------------------------  Cardiac Enzymes No results for input(s): TROPONINI in the last 168 hours. ------------------------------------------------------------------------------------------------------------------  RADIOLOGY:  Ct Chest Wo Contrast  01/31/2015  CLINICAL DATA:  Persistent cough unresponsive to therapy. History of COPD, coronary artery disease, Crohn's disease and esophageal reflux subsequent encounter. EXAM: CT CHEST WITHOUT CONTRAST TECHNIQUE: Multidetector CT imaging of the chest was performed following the standard protocol without IV contrast. COMPARISON:  CTs 05/13/2012 and  09/19/2013.  Radiographs 01/28/2015. FINDINGS: Mediastinum/Nodes: Right IJ hemodialysis catheters extend to the SVC right atrial junction. There is some soft tissue emphysema in the right supraclavicular area at the level of the catheter insertion. There is no pneumothorax. Dilatation of the right internal jugular, brachiocephalic veins and SVC is similar to the prior study. No definite mediastinal hematoma. There is central enlargement of the pulmonary arteries consistent with pulmonary arterial hypertension. There is diffuse atherosclerosis of the aorta, great vessels and coronary arteries.The heart size is stable. There is a small pericardial effusion. Allowing for noncontrast technique, no enlarged mediastinal, hilar or axillary lymph nodes identified. Lungs/Pleura: There are new moderate size dependent pleural effusions bilaterally. There are associated dependent airspace opacities which are most consistent with atelectasis. Diffuse changes of moderate underlying emphysema are again noted. The interstitial prominence has mildly increased, suggesting superimposed edema. There is an 11 mm polypoid filling defect within the right mainstem bronchus on image number 24 which appears new. There is no lobar collapse. Upper abdomen: The visualized upper abdomen is again notable for hepatic cirrhosis and splenomegaly. There is increased ascites. The adrenal glands are incompletely visualized. Musculoskeletal/Chest wall: There is no chest wall mass or suspicious osseous finding. Chronic superior endplate compression deformity at T11 appears unchanged. IMPRESSION: 1. New moderate pleural effusions bilaterally with associated dependent airspace opacities in both lung bases, most consistent with atelectasis. Pneumonia not excluded. 2. Possible polypoid filling defect in the right mainstem bronchus versus mucus. No resulting lobar collapse. Recommend follow-up CT after resolution of the acute illness or bronchoscopy to  exclude neoplasm. 3. Advanced emphysema with possible mild superimposed edema. 4. Stable cardiomegaly, diffuse atherosclerosis and central dilatation of the pulmonary arteries consistent with pulmonary arterial hypertension. 5. Cirrhosis, portal hypertension and increased ascites in the upper abdomen. Electronically Signed   By: Carey Bullocks M.D.   On: 01/31/2015 17:02    EKG:   Orders placed or performed during the hospital encounter of 09/19/13  . EKG 12-Lead  . EKG 12-Lead  . EKG    ASSESSMENT AND PLAN:   # Acute renal failure on CKD3  Renal biopsy prelim - ATN With volume overload with edema and shortness of breath at the time of admission  Improved but still has crackles on exam and pedal edema.  CT chest showed bilateral pleural effusions Tunnel HD catheter placed 10/21 Discussed with Dr. Thedore Mins. Start IV lasix BID today  #  Left lower extremity cellulitis:  Elevate the leg.   Cellulitis improved.  # Hemoptysis. CT chest showed a polyp vs mucus plug in bronchus. Consulted pulmonary. Waiting for input.  #  Chronic respiratory failure on 3 L nasal cannula due to COPD due to smoking Oxygenation is stable at this time.    # Acute on chronic anemia:  Anemia, liver cirrhosis, Crohn's disease concerned about causes for cirrhosis thrombocytopenia ( [employer] [place])Patient has guaiac-positive stools will need GI workup  EGD and colonoscopy on monday . Transfuse if < 7  # Anxiety: continue xanax as she takes this at home.  # Thrombocytopenia/ coagulopathy likely due to liver disease  And crohns disease Appreciate oncology recs Vit k sq given once 10/16 for coagulopathy Discontinued Lovenox. Discontinued heparin  flushes and heparin given during dialysis.  F/u Hit panel and hepatitis labs  CODE STATUS: DNR ,discussed with patient and daughter and sister at bedside on admission.  Disposition - to ALF when stable.  All the records are reviewed and case discussed with Care  Management/Social Workerr. Management plans discussed with the patient, family and they are in agreement.  TOTAL TIME TAKING CARE OF THIS PATIENT: 35 minutes.   POSSIBLE D/C IN  2-3  DAYS, DEPENDING ON CLINICAL CONDITION.   Milagros Loll R M.D on 02/02/2015 at 10:47 AM  Between 7am to 6pm - Pager - [phone] After 6pm go to [email or web address] - password [email or web address] [facility]  [place] [facility]  Office  [phone]  CC: Primary care physician; [name], MD

## 2015-02-02 NOTE — Progress Notes (Signed)
Subjective:  Doing fair today, No shortness of breath Preliminary biopsy report shows severe ATN GI evaluation ongoing. Upper and lower endoscopy planned on Monday CT of the chest shows moderate pleural effusions bilaterally, atelectasis, filling defect in the right main stem bronchus versus mucous, advanced emphysema, cardiomegaly, diffuse atherosclerosis, pulmonary hypertension, cirrhosis, portal hypertension, ascites Patient states that she is urinating more now Serum creatinine is higher at 5.42  Objective:  Vital signs in last 24 hours:  Temp:  [98 F (36.7 C)-98.2 F (36.8 C)] 98 F (36.7 C) (10/23 0422) Pulse Rate:  [70-76] 70 (10/23 1142) Resp:  [18-22] 20 (10/23 0422) BP: (115-128)/(38-53) 128/40 mmHg (10/23 1142) SpO2:  [96 %-100 %] 100 % (10/23 0422) Weight:  [72.439 kg (159 lb 11.2 oz)] 72.439 kg (159 lb 11.2 oz) (10/23 0618)  Weight change: 11.204 kg (24 lb 11.2 oz) Filed Weights   01/31/15 0500 02/01/15 0500 02/02/15 0618  Weight: 69.219 kg (152 lb 9.6 oz) 61.236 kg (135 lb) 72.439 kg (159 lb 11.2 oz)    Intake/Output: I/O last 3 completed shifts: In: 1080 [P.O.:1080] Out: 350 [Urine:350]   Intake/Output this shift:  Total I/O In: 600 [P.O.:600] Out: 0   Physical Exam: General: NAD,    Head: Normocephalic, atraumatic. Moist oral mucosal membranes  Eyes: Anicteric  Neck: Supple, trachea midline  Lungs:  Clear to auscultation normal effort  Heart: Regular rate and rhythm  Abdomen:  Soft, nontender, BS present, distended   Extremities:  + peripheral edema.  Neurologic: Nonfocal, moving all four extremities  Skin: No lesions  Access: Right IJ dialysis catheter.    Basic Metabolic Panel:  Recent Labs Lab 01/27/15 2118 01/29/15 0603 01/30/15 0454 02/01/15 1300 02/02/15 0732  NA 140 141 141 138 137  K 3.8 3.6 3.9 4.0 4.0  CL 102 102 103 96* 96*  CO2 29 33* 33* 31 32  GLUCOSE 144* 82 104* 117* 94  BUN 15 16 26* 48* 50*  CREATININE 2.38* 2.57*  3.53* 5.18* 5.42*  CALCIUM 8.1* 8.3* 8.4* 8.9 8.4*    Liver Function Tests:  Recent Labs Lab 01/27/15 2118 01/29/15 0603  AST 53* 48*  ALT 16 20  ALKPHOS 161* 150*  BILITOT 2.3* 2.8*  PROT 5.5* 5.4*  ALBUMIN 2.7* 2.9*   No results for input(s): LIPASE, AMYLASE in the last 168 hours.  Recent Labs Lab 01/29/15 0603  AMMONIA 49*    CBC:  Recent Labs Lab 01/28/15 2008 01/29/15 0603 01/29/15 1451 01/30/15 2050 01/31/15 0841 02/01/15 1300 02/02/15 0732  WBC 4.2 3.1*  --  6.1 5.0 4.9 3.1*  NEUTROABS 3.1  --   --   --   --   --  1.7  HGB 8.2* 7.5* 7.7* 8.2* 8.7* 8.9* 7.6*  HCT 25.5* 23.3*  --  25.4* 27.3* 27.8* 23.7*  MCV 90.9 90.9  --  91.7 92.6 92.2 91.5  PLT 76* 68*  --  92* 104* 116* 93*    Cardiac Enzymes: No results for input(s): CKTOTAL, CKMB, CKMBINDEX, TROPONINI in the last 168 hours.  BNP: Invalid input(s): POCBNP  CBG: No results for input(s): GLUCAP in the last 168 hours.  Microbiology: Results for orders placed or performed during the hospital encounter of 01/22/15  C difficile quick scan w PCR reflex     Status: None   Collection Time: 01/23/15  4:46 PM  Result Value Ref Range Status   C Diff antigen NEGATIVE NEGATIVE Final   C Diff toxin NEGATIVE NEGATIVE Final  C Diff interpretation Negative for C. difficile  Final    Coagulation Studies: No results for input(s): LABPROT, INR in the last 72 hours.  Urinalysis: No results for input(s): COLORURINE, LABSPEC, PHURINE, GLUCOSEU, HGBUR, BILIRUBINUR, KETONESUR, PROTEINUR, UROBILINOGEN, NITRITE, LEUKOCYTESUR in the last 72 hours.  Invalid input(s): APPERANCEUR    Imaging: Ct Chest Wo Contrast  01/31/2015  CLINICAL DATA:  Persistent cough unresponsive to therapy. History of COPD, coronary artery disease, Crohn's disease and esophageal reflux subsequent encounter. EXAM: CT CHEST WITHOUT CONTRAST TECHNIQUE: Multidetector CT imaging of the chest was performed following the standard protocol  without IV contrast. COMPARISON:  CTs 05/13/2012 and 09/19/2013.  Radiographs 01/28/2015. FINDINGS: Mediastinum/Nodes: Right IJ hemodialysis catheters extend to the SVC right atrial junction. There is some soft tissue emphysema in the right supraclavicular area at the level of the catheter insertion. There is no pneumothorax. Dilatation of the right internal jugular, brachiocephalic veins and SVC is similar to the prior study. No definite mediastinal hematoma. There is central enlargement of the pulmonary arteries consistent with pulmonary arterial hypertension. There is diffuse atherosclerosis of the aorta, great vessels and coronary arteries.The heart size is stable. There is a small pericardial effusion. Allowing for noncontrast technique, no enlarged mediastinal, hilar or axillary lymph nodes identified. Lungs/Pleura: There are new moderate size dependent pleural effusions bilaterally. There are associated dependent airspace opacities which are most consistent with atelectasis. Diffuse changes of moderate underlying emphysema are again noted. The interstitial prominence has mildly increased, suggesting superimposed edema. There is an 11 mm polypoid filling defect within the right mainstem bronchus on image number 24 which appears new. There is no lobar collapse. Upper abdomen: The visualized upper abdomen is again notable for hepatic cirrhosis and splenomegaly. There is increased ascites. The adrenal glands are incompletely visualized. Musculoskeletal/Chest wall: There is no chest wall mass or suspicious osseous finding. Chronic superior endplate compression deformity at T11 appears unchanged. IMPRESSION: 1. New moderate pleural effusions bilaterally with associated dependent airspace opacities in both lung bases, most consistent with atelectasis. Pneumonia not excluded. 2. Possible polypoid filling defect in the right mainstem bronchus versus mucus. No resulting lobar collapse. Recommend follow-up CT after  resolution of the acute illness or bronchoscopy to exclude neoplasm. 3. Advanced emphysema with possible mild superimposed edema. 4. Stable cardiomegaly, diffuse atherosclerosis and central dilatation of the pulmonary arteries consistent with pulmonary arterial hypertension. 5. Cirrhosis, portal hypertension and increased ascites in the upper abdomen. Electronically Signed   By: Richardean Sale M.D.   On: 01/31/2015 17:02     Medications:   . anticoagulant sodium citrate     . arformoterol  15 mcg Nebulization BID  . feeding supplement (ENSURE ENLIVE)  237 mL Oral Q24H  . furosemide  40 mg Intravenous Q6H  . pantoprazole  40 mg Oral Daily  . polyethylene glycol-electrolytes  4,000 mL Oral Once   acetaminophen **OR** acetaminophen, albuterol, ALPRAZolam, alteplase, anticoagulant sodium citrate, benzonatate, guaiFENesin-dextromethorphan, lidocaine (PF), lidocaine-prilocaine, loperamide, nitroGLYCERIN, ondansetron **OR** ondansetron (ZOFRAN) IV, pentafluoroprop-tetrafluoroeth, polyethylene glycol  Assessment/ Plan:  81 y.o. female with past medical history of coronary disease- with coronary stents, Hypertension, Crohn's disease, COPD, uses home oxygen around the clock, Kidney stones- lithotripsy, ureteral stents, History of shingles, Arthritis, Hyperlipidemia, Partial resection of bowel for Crohn's disease, liver cirrhosis.   1.  Acute renal failure secondary to severe ATN/CKD stage III.  Baseline creatinine 1.28 in July 2016 The patient has had progressively worsening renal function since July of 2016.  EGFR was down to 6 as  outpt most recently. - Preliminary renal biopsy report show severe ATN and oxalate crystals. Possibly from overdiuresis in the setting of cirrhosis. Oxalate crystals are likely related to malabsorption from Crohn's disease - Patient will need intermittent dialysis based on labs and clinical status.  - PermCath placed on Friday.  Electrolytes and Volume status are  acceptable No acute indication for Dialysis at present    2.  Anemia of CKD/thrombocytopenia:  hgb 8.7   GI evaluation for hemoccult positive - upper endoscopy endoscopy and colonoscopy are planned for Monday Pack Dialysis cathter with citrate  3.  SHPTH of renal origin: follow up phos    4. Edema - IV Lasix 2 today    LOS: 11 Ary Rudnick 10/23/201611:46 AM

## 2015-02-02 NOTE — Consult Note (Signed)
PULMONARY / CRITICAL CARE MEDICINE   Name: Brandi Cline MRN: [id number] DOB: 12/16/1930    ADMISSION DATE:  01/22/2015 CONSULTATION DATE:  02/02/15  REFERRING MD :  Dr. Elpidio Anis   CHIEF COMPLAINT:     Cough, hemoptysis, abnormal CT chest finding   HISTORY OF PRESENT ILLNESS    Patient is a 81 year old female past medical history of acute on chronic renal failure, possible cirrhosis, Crohn's disease, seen in consultation for abdomen the on chest CT. Patient was admitted on 01/22/2015 for shortness of breath, lower extremity edema, left leg pain. She has a history of stage III chronic kidney disease with a recent hospitalization and also a history of COPD with chronic respiratory failure on 3-4 L of nasal cannula. She initially was seen at nephrology office for concern to volume overload and left lower extremity cellulitis, she had not had any urination in 2-3 days prior to that she was urgently admitted to the hospital for acute on chronic renal failure. Since then she has developed a cough, thrombocytopenia, given her history of chronic anemia and history of Crohn's disease, GI was consult it and she is scheduled to have a colonoscopy on Monday, 02/03/2015. She also tested positive for blood in stool. Off note, patient also has a history of recurrent UTI, she was placed on "antibiotic" in July for her UTI that time, since then her creatinine has slowly been trending down, and she's been followed by nephrology, Dr. Thedore Mins. Patient states she is a former smoker, quit in 1992, smoked about 1-1.5 packs per day for 25 years. She endorses blood-tinged sputum, productive cough.    SIGNIFICANT EVENTS     PAST MEDICAL HISTORY    :  Past Medical History  Diagnosis Date  . Dyspnea   . Esophageal reflux   . CAD (coronary artery disease)   . Hypertension   . Crohn's disease (HCC)   . Allergic rhinitis   . COPD (chronic obstructive pulmonary disease) (HCC)   . History of kidney stones   .  History of shingles   . Trigeminal neuralgia   . Family history of anesthesia complication     " SISTER HAD BAD FEELINGS "  . Anginal pain (HCC)   . Anxiety     ' JUST FOR MY BREATHING "  . Arthritis     mild in hands  . Hyperlipidemia   . CKD (chronic kidney disease)    Past Surgical History  Procedure Laterality Date  . Coronary stents    . Lithotripsy    . Ureteral stent placement    . Cholecystectomy    . Total abdominal hysterectomy    . Partial resections large and small bowel for crohn's    . Breast lumps benign    . Left heart catheterization with coronary angiogram N/A 10/28/2011    Procedure: LEFT HEART CATHETERIZATION WITH CORONARY ANGIOGRAM;  Surgeon: Lesleigh Noe, MD;  Location: [facility] C[facility] LAB;  Service: Cardiovascular;  Laterality: N/A;   Prior to Admission medications   Medication Sig Start Date End Date Taking? Authorizing Provider  albuterol (PROVENTIL HFA;VENTOLIN HFA) 108 (90 BASE) MCG/ACT inhaler Inhale 2 puffs into the lungs every 6 (six) hours as needed for wheezing or shortness of breath. 10/08/14  Yes Waymon Budge, MD  albuterol (PROVENTIL) (2.5 MG/3ML) 0.083% nebulizer solution Take 3 mLs (2.5 mg total) by nebulization every 4 (four) hours as needed. Shortness of breath 10/03/14  Yes Waymon Budge, MD  ALPRAZolam Prudy Feeler) 0.5 MG  tablet TAKE ONE TABLET BY MOUTH THREE TIMES DAILY AS NEEDED FOR ANXIETY 01/16/15  Yes Dianne Dun, MD  arformoterol (BROVANA) 15 MCG/2ML NEBU Take 2 mLs (15 mcg total) by nebulization 2 (two) times daily. 08/14/14  Yes Waymon Budge, MD  BIOTIN 5000 PO Take 2 tablets by mouth daily.   Yes Historical Provider, MD  Cholecalciferol (VITAMIN D-3) 5000 UNITS TABS Take 1 tablet by mouth daily.   Yes Historical Provider, MD  isosorbide mononitrate (IMDUR) 30 MG 24 hr tablet TAKE TWO TABLETS BY MOUTH DAILY 08/13/14  Yes Lyn Records, MD  loratadine (CLARITIN) 10 MG tablet Take 10 mg by mouth daily as needed.  07/11/12  Yes Marden Noble, MD   magnesium oxide (MAG-OX) 400 MG tablet Take 400 mg by mouth daily.    Yes Historical Provider, MD  Multiple Vitamin (MULTIVITAMIN) tablet Take 1 tablet by mouth daily.    Yes Historical Provider, MD  nitroGLYCERIN (NITROSTAT) 0.4 MG SL tablet Place 1 tablet (0.4 mg total) under the tongue every 5 (five) minutes as needed. Chest pain 04/27/13  Yes Lyn Records, MD  omeprazole (PRILOSEC) 20 MG capsule TAKE ONE CAPSULE BY MOUTH DAILY 12/03/14  Yes Dianne Dun, MD  Zinc Acetate, Oral, (ZINC ACETATE PO) Take 1 tablet by mouth daily.   Yes Historical Provider, MD   Allergies  Allergen Reactions  . Avelox [Moxifloxacin Hcl In Nacl] Shortness Of Breath, Swelling and Rash  . Codeine Shortness Of Breath and Other (See Comments)    Couldn't breath  . Theophyllines     Slight increased heart rate  . Ciprofloxacin Other (See Comments)    nervous  . Milk-Related Compounds Other (See Comments)    Whole milk hurts stomach. Not all dairy products do this.  . Tape     Please use paper tape only  . Lisinopril Rash     FAMILY HISTORY   Family History  Problem Relation Age of Onset  . Breast cancer Sister   . COPD Sister   . Stroke Mother   . Prostate cancer Father   . Prostate cancer Brother   . Heart attack Brother       SOCIAL HISTORY    reports that she quit smoking about 24 years ago. Her smoking use included Cigarettes. She has a 30 pack-year smoking history. She has never used smokeless tobacco. She reports that she does not drink alcohol or use illicit drugs.  Review of Systems  Constitutional: Positive for weight loss and malaise/fatigue. Negative for fever and chills.  HENT: Negative for hearing loss and tinnitus.   Eyes: Negative for blurred vision and double vision.  Respiratory: Positive for cough, hemoptysis, sputum production and shortness of breath.   Cardiovascular: Positive for orthopnea and leg swelling.  Gastrointestinal: Negative for heartburn, nausea, vomiting and  abdominal pain.  Genitourinary: Negative for dysuria.  Skin: Negative for itching and rash.  Neurological: Negative for headaches.      VITAL SIGNS    Temp:  [97.7 F (36.5 C)-98.2 F (36.8 C)] 97.7 F (36.5 C) (10/23 1241) Pulse Rate:  [70-77] 77 (10/23 1241) Resp:  [20-22] 20 (10/23 1241) BP: (115-128)/(38-53) 125/53 mmHg (10/23 1241) SpO2:  [96 %-100 %] 97 % (10/23 1241) Weight:  [159 lb 11.2 oz (72.439 kg)] 159 lb 11.2 oz (72.439 kg) (10/23 0618) HEMODYNAMICS:   VENTILATOR SETTINGS:   INTAKE / OUTPUT:  Intake/Output Summary (Last 24 hours) at 02/02/15 1513 Last data filed at 02/02/15 1300  Gross per 24 hour  Intake    840 ml  Output    325 ml  Net    515 ml       PHYSICAL EXAM   Physical Exam  Constitutional: She is oriented to person, place, and time. She appears well-developed and well-nourished.  HENT:  Head: Normocephalic and atraumatic.  Right Ear: External ear normal.  Left Ear: External ear normal.  Mouth/Throat: Oropharynx is clear and moist.  Eyes: Pupils are equal, round, and reactive to light.  Neck: Normal range of motion.  Cardiovascular: Normal rate, regular rhythm, normal heart sounds and intact distal pulses.   Pulmonary/Chest: No respiratory distress. She has no wheezes. She has rales. She exhibits no tenderness.  Abdominal: Soft. She exhibits no distension. There is no tenderness.  Musculoskeletal: Normal range of motion. She exhibits edema.  Neurological: She is alert and oriented to person, place, and time.  Skin: Skin is warm and dry.  Nursing note and vitals reviewed.      LABS   LABS:  CBC  Recent Labs Lab 01/31/15 0841 02/01/15 1300 02/02/15 0732  WBC 5.0 4.9 3.1*  HGB 8.7* 8.9* 7.6*  HCT 27.3* 27.8* 23.7*  PLT 104* 116* 93*   Coag's  Recent Labs Lab 01/28/15 0358 01/28/15 2008 01/29/15 0603  INR 1.55 1.25 1.34   BMET  Recent Labs Lab 01/30/15 0454 02/01/15 1300 02/02/15 0732  NA 141 138 137  K  3.9 4.0 4.0  CL 103 96* 96*  CO2 33* 31 32  BUN 26* 48* 50*  CREATININE 3.53* 5.18* 5.42*  GLUCOSE 104* 117* 94   Electrolytes  Recent Labs Lab 01/30/15 0454 02/01/15 1300 02/02/15 0732  CALCIUM 8.4* 8.9 8.4*   Sepsis Markers No results for input(s): LATICACIDVEN, PROCALCITON, O2SATVEN in the last 168 hours. ABG No results for input(s): PHART, PCO2ART, PO2ART in the last 168 hours. Liver Enzymes  Recent Labs Lab 01/27/15 2118 01/29/15 0603  AST 53* 48*  ALT 16 20  ALKPHOS 161* 150*  BILITOT 2.3* 2.8*  ALBUMIN 2.7* 2.9*   Cardiac Enzymes No results for input(s): TROPONINI, PROBNP in the last 168 hours. Glucose No results for input(s): GLUCAP in the last 168 hours.   Recent Results (from the past 240 hour(s))  C difficile quick scan w PCR reflex     Status: None   Collection Time: 01/23/15  4:46 PM  Result Value Ref Range Status   C Diff antigen NEGATIVE NEGATIVE Final   C Diff toxin NEGATIVE NEGATIVE Final   C Diff interpretation Negative for C. difficile  Final     Current facility-administered medications:  .  acetaminophen (TYLENOL) tablet 650 mg, 650 mg, Oral, Q6H PRN, 650 mg at 02/01/15 2137 **OR** acetaminophen (TYLENOL) suppository 650 mg, 650 mg, Rectal, Q6H PRN, Gale Journey, MD .  albuterol (PROVENTIL) (2.5 MG/3ML) 0.083% nebulizer solution 2.5 mg, 2.5 mg, Nebulization, Q4H PRN, Gale Journey, MD, 2.5 mg at 01/30/15 0803 .  ALPRAZolam Prudy Feeler) tablet 0.5 mg, 0.5 mg, Oral, TID PRN, Gale Journey, MD, 0.5 mg at 02/01/15 2128 .  alteplase (CATHFLO ACTIVASE) injection 2 mg, 2 mg, Intracatheter, Once PRN, Munsoor Lateef, MD .  anticoagulant sodium citrate solution 5 mL, 5 mL, Dialysis, Continuous PRN, Mosetta Pigeon, MD, 5 mL at 01/27/15 1135 .  arformoterol (BROVANA) nebulizer solution 15 mcg, 15 mcg, Nebulization, BID, Gale Journey, MD, 15 mcg at 02/02/15 0736 .  benzonatate (TESSALON) capsule 100 mg, 100 mg, Oral, Q6H PRN,  Srikar Sudini,  MD, 100 mg at 02/02/15 1313 .  epoetin alfa (EPOGEN,PROCRIT) injection 20,000 Units, 20,000 Units, Subcutaneous, Weekly, Mosetta Pigeon, MD, 20,000 Units at 02/02/15 1259 .  feeding supplement (ENSURE ENLIVE) (ENSURE ENLIVE) liquid 237 mL, 237 mL, Oral, Q24H, Aruna Gouru, MD, 237 mL at 02/01/15 1600 .  furosemide (LASIX) injection 40 mg, 40 mg, Intravenous, Q6H, Harmeet Singh, MD, 40 mg at 02/02/15 1142 .  guaiFENesin-dextromethorphan (ROBITUSSIN DM) 100-10 MG/5ML syrup 10 mL, 10 mL, Oral, Q6H PRN, Ramonita Lab, MD, 10 mL at 01/30/15 2104 .  lidocaine (PF) (XYLOCAINE) 1 % injection 5 mL, 5 mL, Intradermal, PRN, Munsoor Lateef, MD .  lidocaine-prilocaine (EMLA) cream 1 application, 1 application, Topical, PRN, Munsoor Lateef, MD .  loperamide (IMODIUM) capsule 2 mg, 2 mg, Oral, Q6H PRN, Oralia Manis, MD, 2 mg at 02/01/15 2128 .  nitroGLYCERIN (NITROSTAT) SL tablet 0.4 mg, 0.4 mg, Sublingual, Q5 min PRN, Gale Journey, MD .  ondansetron [facility]) tablet 4 mg, 4 mg, Oral, Q6H PRN, 4 mg at 01/30/15 2106 **OR** ondansetron (ZOFRAN) injection 4 mg, 4 mg, Intravenous, Q6H PRN, Gale Journey, MD, 4 mg at 01/26/15 1858 .  pantoprazole (PROTONIX) EC tablet 40 mg, 40 mg, Oral, Daily, Gale Journey, MD, 40 mg at 02/02/15 1014 .  pentafluoroprop-tetrafluoroeth (GEBAUERS) aerosol 1 application, 1 application, Topical, PRN, Munsoor Lateef, MD .  polyethylene glycol (MIRALAX / GLYCOLAX) packet 17 g, 17 g, Oral, Daily PRN, Gale Journey, MD .  polyethylene glycol-electrolytes (NuLYTELY/GoLYTELY) solution 4,000 mL, 4,000 mL, Oral, Once, PAGAN LOVEALL, [facility]  IMAGING    No results found.    Indwelling Urinary Catheter continued, requirement due to   Reason to continue Indwelling Urinary Catheter for strict Intake/Output monitoring for hemodynamic instability   Central Line continued, requirement due to   Reason to continue [employer] Monitoring of central venous pressure or other hemodynamic  parameters   Ventilator continued, requirement due to, resp failure    Ventilator Sedation RASS 0 to -2   Lines:  Cultures: Urine Cx 10/1>>no growth Urine Cx 9/28>>E. Coli  Antibiotics: Vanc  10/15>10/17  ASSESSMENT/PLAN  81 year old female past medical history of CAD, status post stents, hypertension, Crohn's disease, COPD on chronic O2, urethral stents, arthritis hyperlipidemia, Crohn's status post partial resection, liver cirrhosis seen in consultation for CT chest abnormality of the right mainstem.  CT chest abnormality -Right mainstem, polyp versus mucoid impaction, versus endobronchial lesion -Most likely mucoid impaction, however however malignancy cannot be completely ruled out giving her history of smoking and now with hemoptysis along with location and advanced age. -Patient has colonoscopy scheduled for Monday, we will try to schedule bronchoscopy on Tuesday or Wednesday. -Patient son was present in the room, bronchoscopy for airway section and possible biopsy discussed with patient and her son, they have decided to proceed with procedure later in the week.  Bilateral pleural effusion -Volume overload in combination with pneumonia, -Continue with diuresis and monitoring strict I's and O's -Patient does have a PermCath placed, however no urgent indication for dialysis at this time especially with improving kidney function.  COPD-emphysematous type -History of smoking, continue with supplemental oxygen to maintain saturations greater than 88% -Continue with DuoNeb's while in the inpatient setting, may start back home inhalers closer to discharge  I have personally obtained a history, examined the patient, evaluated laboratory and imaging results, formulated the assessment and plan and placed orders.  Pulmonary consult time-45 minutes  Stephanie Acre, MD  Pulmonary and Critical  Care Pager [phone] (please enter 7-digits) On Call Pager - [phone]  (please enter 7-digits)   02/02/2015, 3:13 PM

## 2015-02-03 ENCOUNTER — Inpatient Hospital Stay: Payer: Medicare HMO | Admitting: Anesthesiology

## 2015-02-03 ENCOUNTER — Encounter
Admission: AD | Disposition: A | Discharge status: Skilled Nursing Facility | Payer: Self-pay | Source: Ambulatory Visit | Attending: Internal Medicine

## 2015-02-03 DIAGNOSIS — D509 Iron deficiency anemia, unspecified: Secondary | ICD-10-CM

## 2015-02-03 DIAGNOSIS — R195 Other fecal abnormalities: Secondary | ICD-10-CM

## 2015-02-03 DIAGNOSIS — Z980 Intestinal bypass and anastomosis status: Secondary | ICD-10-CM | POA: Insufficient documentation

## 2015-02-03 HISTORY — PX: ESOPHAGOGASTRODUODENOSCOPY (EGD) WITH PROPOFOL: SHX5813

## 2015-02-03 HISTORY — PX: COLONOSCOPY WITH PROPOFOL: SHX5780

## 2015-02-03 LAB — CBC WITH DIFFERENTIAL/PLATELET
Basophils Absolute: 0 10*3/uL (ref 0–0.1)
EOS ABS: 0.1 10*3/uL (ref 0–0.7)
HEMATOCRIT: 24.2 % — AB (ref 35.0–47.0)
Hemoglobin: 7.7 g/dL — ABNORMAL LOW (ref 12.0–16.0)
Lymphocytes Relative: 19 %
Lymphs Abs: 0.6 10*3/uL — ABNORMAL LOW (ref 1.0–3.6)
MCH: 29.1 pg (ref 26.0–34.0)
MCHC: 32 g/dL (ref 32.0–36.0)
MCV: 91 fL (ref 80.0–100.0)
MONO ABS: 0.5 10*3/uL (ref 0.2–0.9)
Monocytes Relative: 15 %
Neutro Abs: 2 10*3/uL (ref 1.4–6.5)
Neutrophils Relative %: 62 %
PLATELETS: 98 10*3/uL — AB (ref 150–440)
RBC: 2.66 MIL/uL — AB (ref 3.80–5.20)
RDW: 14.8 % — ABNORMAL HIGH (ref 11.5–14.5)
WBC: 3.2 10*3/uL — ABNORMAL LOW (ref 3.6–11.0)

## 2015-02-03 LAB — OCCULT BLOOD X 1 CARD TO LAB, STOOL: Fecal Occult Bld: POSITIVE — AB

## 2015-02-03 LAB — HEPATITIS PANEL, ACUTE
HEP A IGM: NEGATIVE
Hep B C IgM: NEGATIVE
Hepatitis B Surface Ag: NEGATIVE

## 2015-02-03 LAB — ALPHA-1 ANTITRYPSIN PHENOTYPE: A-1 Antitrypsin, Ser: 171 mg/dL (ref 90–200)

## 2015-02-03 LAB — AFP TUMOR MARKER: AFP TUMOR MARKER: 2.7 ng/mL (ref 0.0–8.3)

## 2015-02-03 LAB — CERULOPLASMIN: Ceruloplasmin: 22.9 mg/dL (ref 19.0–39.0)

## 2015-02-03 LAB — ANTI-SMOOTH MUSCLE ANTIBODY, IGG: F-ACTIN AB IGG: 8 U (ref 0–19)

## 2015-02-03 LAB — MITOCHONDRIAL ANTIBODIES: Mitochondrial M2 Ab, IgG: 9.6 Units (ref 0.0–20.0)

## 2015-02-03 LAB — ANTINUCLEAR ANTIBODIES, IFA: ANA Ab, IFA: NEGATIVE

## 2015-02-03 SURGERY — ESOPHAGOGASTRODUODENOSCOPY (EGD) WITH PROPOFOL
Anesthesia: General

## 2015-02-03 MED ORDER — SODIUM CHLORIDE 0.9 % IV SOLN
INTRAVENOUS | Status: DC | PRN
  Administered 2015-02-03: 16:00:00 via INTRAVENOUS

## 2015-02-03 MED ORDER — PROPOFOL 500 MG/50ML IV EMUL
INTRAVENOUS | Status: DC | PRN
  Administered 2015-02-03: 75 ug/kg/min via INTRAVENOUS

## 2015-02-03 MED ORDER — PROPOFOL 10 MG/ML IV BOLUS
INTRAVENOUS | Status: DC | PRN
  Administered 2015-02-03: 40 mg via INTRAVENOUS
  Administered 2015-02-03: 80 mg via INTRAVENOUS
  Administered 2015-02-03: 20 mg via INTRAVENOUS

## 2015-02-03 NOTE — Progress Notes (Signed)
Subjective:  Pt seen at bedside.  No new Cr this AM.  Pt with some nausea but overall reports that her appetite has improved.    Objective:  Vital signs in last 24 hours:  Temp:  [97.7 F (36.5 C)-98.5 F (36.9 C)] 98.4 F (36.9 C) (10/24 0526) Pulse Rate:  [75-87] 83 (10/24 0526) Resp:  [18-20] 18 (10/24 0526) BP: (125-153)/(37-108) 126/38 mmHg (10/24 0526) SpO2:  [92 %-99 %] 99 % (10/24 0832) Weight:  [71.578 kg (157 lb 12.8 oz)] 71.578 kg (157 lb 12.8 oz) (10/24 0500)  Weight change: -0.862 kg (-1 lb 14.4 oz) Filed Weights   02/01/15 0500 02/02/15 0618 02/03/15 0500  Weight: 61.236 kg (135 lb) 72.439 kg (159 lb 11.2 oz) 71.578 kg (157 lb 12.8 oz)    Intake/Output: I/O last 3 completed shifts: In: 1200 [P.O.:1200] Out: 775 [Urine:775]   Intake/Output this shift:     Physical Exam: General: NAD  Head: Normocephalic, atraumatic. Moist oral mucosal membranes  Eyes: Anicteric  Neck: Supple, trachea midline  Lungs:  Clear to auscultation normal effort  Heart: Regular rate and rhythm no rubs  Abdomen:  Soft, nontender, BS present, distended   Extremities:  3+ peripheral edema.  Neurologic: Nonfocal, moving all four extremities  Skin: No lesions  Access: Right IJ dialysis catheter.    Basic Metabolic Panel:  Recent Labs Lab 01/27/15 2118 01/29/15 0603 01/30/15 0454 02/01/15 1300 02/02/15 0732  NA 140 141 141 138 137  K 3.8 3.6 3.9 4.0 4.0  CL 102 102 103 96* 96*  CO2 29 33* 33* 31 32  GLUCOSE 144* 82 104* 117* 94  BUN 15 16 26* 48* 50*  CREATININE 2.38* 2.57* 3.53* 5.18* 5.42*  CALCIUM 8.1* 8.3* 8.4* 8.9 8.4*    Liver Function Tests:  Recent Labs Lab 01/27/15 2118 01/29/15 0603  AST 53* 48*  ALT 16 20  ALKPHOS 161* 150*  BILITOT 2.3* 2.8*  PROT 5.5* 5.4*  ALBUMIN 2.7* 2.9*   No results for input(s): LIPASE, AMYLASE in the last 168 hours.  Recent Labs Lab 01/29/15 0603  AMMONIA 49*    CBC:  Recent Labs Lab 01/28/15 2008   01/30/15 2050 01/31/15 0841 02/01/15 1300 02/02/15 0732 02/03/15 0709  WBC 4.2  < > 6.1 5.0 4.9 3.1* 3.2*  NEUTROABS 3.1  --   --   --   --  1.7 2.0  HGB 8.2*  < > 8.2* 8.7* 8.9* 7.6* 7.7*  HCT 25.5*  < > 25.4* 27.3* 27.8* 23.7* 24.2*  MCV 90.9  < > 91.7 92.6 92.2 91.5 91.0  PLT 76*  < > 92* 104* 116* 93* 98*  < > = values in this interval not displayed.  Cardiac Enzymes: No results for input(s): CKTOTAL, CKMB, CKMBINDEX, TROPONINI in the last 168 hours.  BNP: Invalid input(s): POCBNP  CBG: No results for input(s): GLUCAP in the last 168 hours.  Microbiology: Results for orders placed or performed during the hospital encounter of 01/22/15  C difficile quick scan w PCR reflex     Status: None   Collection Time: 01/23/15  4:46 PM  Result Value Ref Range Status   C Diff antigen NEGATIVE NEGATIVE Final   C Diff toxin NEGATIVE NEGATIVE Final   C Diff interpretation Negative for C. difficile  Final    Coagulation Studies: No results for input(s): LABPROT, INR in the last 72 hours.  Urinalysis: No results for input(s): COLORURINE, LABSPEC, [place], [place], [place], [place], [place], [place], [place], NITRITE, LEUKOCYTESUR  in the last 72 hours.  Invalid input(s): APPERANCEUR    Imaging: No results found.   Medications:   . anticoagulant sodium citrate     . arformoterol  15 mcg Nebulization BID  . epoetin (EPOGEN/PROCRIT) injection  20,000 Units Subcutaneous Weekly  . feeding supplement (ENSURE ENLIVE)  237 mL Oral Q24H  . pantoprazole  40 mg Oral Daily   acetaminophen **OR** acetaminophen, albuterol, ALPRAZolam, alteplase, anticoagulant sodium citrate, benzonatate, guaiFENesin-dextromethorphan, lidocaine (PF), lidocaine-prilocaine, loperamide, nitroGLYCERIN, ondansetron **OR** ondansetron (ZOFRAN) IV, pentafluoroprop-tetrafluoroeth, polyethylene glycol  Assessment/ Plan:  81 y.o. female with past medical history of coronary disease- with coronary  stents, Hypertension, Crohn's disease, COPD, uses home oxygen around the clock, Kidney stones- lithotripsy, ureteral stents, History of shingles, Arthritis, Hyperlipidemia, Partial resection of bowel for Crohn's disease, liver cirrhosis.   1.  Acute renal failure secondary to severe ATN/CKD stage III.  Baseline creatinine 1.28 in July 2016 The patient has had progressively worsening renal function since July of 2016.  EGFR was down to 6 as outpt most recently.Preliminary renal biopsy report show severe ATN and oxalate crystals. Possibly from overdiuresis in the setting of cirrhosis. Oxalate crystals are likely related to malabsorption from Crohn's disease - No new Cr at present, has some nausea but overall reports appetite improved.  No urgent indication for HD today but will reassess for need of HD tomorrow.     2.  Anemia of CKD/thrombocytopenia:  hgb currently 7.7, would transfuse for hgb of 7 or less.  Due for additional endoscopy today.  3.  SHPTH of renal origin: will check phos with next HD.   4. Lower extremity Edema - still has considerable LE edema, will consider UF with next HD as indicated.     LOS: 12 Brandi Cline 10/24/201612:27 PM

## 2015-02-03 NOTE — Anesthesia Postprocedure Evaluation (Signed)
  Anesthesia Post-op Note  Patient: Brandi Cline  Procedure(s) Performed: Procedure(s) with comments: ESOPHAGOGASTRODUODENOSCOPY (EGD) WITH PROPOFOL (N/A) - Look into the colon and stomach with a light to look for the source of bleeding. COLONOSCOPY WITH PROPOFOL (N/A)  Anesthesia type:General  Patient location: PACU  Post pain: Pain level controlled  Post assessment: Post-op Vital signs reviewed, Patient's Cardiovascular Status Stable, Respiratory Function Stable, Patent Airway and No signs of Nausea or vomiting  Post vital signs: Reviewed and stable  Last Vitals:  Filed Vitals:   02/03/15 1421  BP:   Pulse: 85  Temp:   Resp:     Level of consciousness: awake, alert  and patient cooperative  Complications: No apparent anesthesia complications

## 2015-02-03 NOTE — Care Management Important Message (Signed)
Important Message  Patient Details  Name: Brandi Cline MRN: [id number] Date of Birth: 12-05-1930   Medicare Important Message Given:  Yes-second notification given    [name] 02/03/2015, 10:03 AM

## 2015-02-03 NOTE — Op Note (Signed)
[facility] Gastroenterology Patient Name: Brandi Cline Procedure Date: 02/03/2015 4:27 PM MRN: [id number] Account #: [id number] Date of Birth: 1930-10-09 Admit Type: Outpatient Age: 81 Room: [facility] ENDO ROOM 4 Gender: Female Note Status: Finalized Procedure:         Colonoscopy Indications:       Gastrointestinal occult blood loss Providers:         Midge Minium, MD Referring MD:      Bryn Gulling. [name], MD (Referring MD) Medicines:         Propofol per Anesthesia Complications:     No immediate complications. Procedure:         Pre-Anesthesia Assessment:                    - Prior to the procedure, a History and Physical was                     performed, and patient medications and allergies were                     reviewed. The patient's tolerance of previous anesthesia                     was also reviewed. The risks and benefits of the procedure                     and the sedation options and risks were discussed with the                     patient. All questions were answered, and informed consent                     was obtained. Prior Anticoagulants: The patient has taken                     no previous anticoagulant or antiplatelet agents. ASA                     Grade Assessment: II - A patient with mild systemic                     disease. After reviewing the risks and benefits, the                     patient was deemed in satisfactory condition to undergo                     the procedure.                    After obtaining informed consent, the colonoscope was                     passed under direct vision. Throughout the procedure, the                     patient's blood pressure, pulse, and oxygen saturations                     were monitored continuously. The Colonoscope was                     introduced through the anus and advanced to the the  ileocolonic anastomosis. The colonoscopy was performed                     without  difficulty. The patient tolerated the procedure                     well. The quality of the bowel preparation was excellent. Findings:      The perianal and digital rectal examinations were normal.      There was evidence of a prior end-to-end ileo-colonic anastomosis in the       transverse colon. This was patent. Impression:        - Patent end-to-end ileo-colonic anastomosis.                    - No specimens collected. Recommendation:    - Return patient to hospital ward for ongoing care. Procedure Code(s): --- Professional ---                    [phone], Colonoscopy, flexible; diagnostic, including                     collection of specimen(s) by brushing or washing, when                     performed (separate procedure) Diagnosis Code(s): --- Professional ---                    Z98.0, Intestinal bypass and anastomosis status                    R19.5, Other fecal abnormalities CPT copyright 2014 American [employer] Association. All rights reserved. The codes documented in this report are preliminary and upon coder review may  be revised to meet current compliance requirements. Midge Minium, MD 02/03/2015 5:03:51 PM This report has been signed electronically. Number of Addenda: 0 Note Initiated On: 02/03/2015 4:27 PM Scope Withdrawal Time: 0 hours 6 minutes 51 seconds  Total Procedure Duration: 0 hours 20 minutes 50 seconds       [facility]

## 2015-02-03 NOTE — Progress Notes (Signed)
Patient could only tolerate 1/2 Golytely prep, refused to drink anymore even with nausea medication, stool light tan and watery.

## 2015-02-03 NOTE — Op Note (Signed)
[facility] Gastroenterology Patient Name: Brandi Cline Procedure Date: 02/03/2015 4:28 PM MRN: [id number] Account #: [id number] Date of Birth: 07/16/1930 Admit Type: Inpatient Age: 81 Room: [facility] ENDO ROOM 4 Gender: Female Note Status: Finalized Procedure:         Upper GI endoscopy Indications:       Iron deficiency anemia, Heme positive stool Providers:         Midge Minium, MD Referring MD:      Bryn Gulling. [name], MD (Referring MD) Medicines:         Propofol per Anesthesia Complications:     No immediate complications. Procedure:         Pre-Anesthesia Assessment:                    - Prior to the procedure, a History and Physical was                     performed, and patient medications and allergies were                     reviewed. The patient's tolerance of previous anesthesia                     was also reviewed. The risks and benefits of the procedure                     and the sedation options and risks were discussed with the                     patient. All questions were answered, and informed consent                     was obtained. Prior Anticoagulants: The patient has taken                     no previous anticoagulant or antiplatelet agents. ASA                     Grade Assessment: II - A patient with mild systemic                     disease. After reviewing the risks and benefits, the                     patient was deemed in satisfactory condition to undergo                     the procedure.                    After obtaining informed consent, the endoscope was passed                     under direct vision. Throughout the procedure, the                     patient's blood pressure, pulse, and oxygen saturations                     were monitored continuously. The Olympus GIF-160 endoscope                     (S#. [id number]) was introduced through the mouth, and  advanced to the second part of duodenum. The upper GI             endoscopy was accomplished without difficulty. The patient                     tolerated the procedure well. Findings:      The esophagus was normal.      The stomach was normal.      The examined duodenum was normal. Impression:        - Normal esophagus.                    - Normal stomach.                    - Normal examined duodenum.                    - No specimens collected. Recommendation:    - Perform a colonoscopy today. Procedure Code(s): --- Professional ---                    [phone], Esophagogastroduodenoscopy, flexible, transoral;                     diagnostic, including collection of specimen(s) by                     brushing or washing, when performed (separate procedure) Diagnosis Code(s): --- Professional ---                    D50.9, Iron deficiency anemia, unspecified                    R19.5, Other fecal abnormalities CPT copyright 2014 American Medical Association. All rights reserved. The codes documented in this report are preliminary and upon coder review may  be revised to meet current compliance requirements. Midge Minium, MD 02/03/2015 4:38:24 PM This report has been signed electronically. Number of Addenda: 0 Note Initiated On: 02/03/2015 4:28 PM      [facility]

## 2015-02-03 NOTE — Anesthesia Preprocedure Evaluation (Signed)
Anesthesia Evaluation  Patient identified by MRN, date of birth, ID band Patient awake    Reviewed: Allergy & Precautions, NPO status , Patient's Chart, lab work & pertinent test results  History of Anesthesia Complications (+) Family history of anesthesia reaction and history of anesthetic complications  Airway Mallampati: III       Dental  (+) Teeth Intact, Missing   Pulmonary shortness of breath, COPD,  COPD inhaler, former smoker,           Cardiovascular hypertension, Pt. on medications + angina with exertion + CAD, + Cardiac Stents and +CHF       Neuro/Psych Anxiety  Neuromuscular disease    GI/Hepatic GERD  Medicated,  Endo/Other    Renal/GU Renal Insufficiency     Musculoskeletal  (+) Arthritis , Osteoarthritis,    Abdominal   Peds  Hematology  (+) anemia ,   Anesthesia Other Findings   Reproductive/Obstetrics                             Anesthesia Physical Anesthesia Plan  ASA: III  Anesthesia Plan: General   Post-op Pain Management:    Induction: Intravenous  Airway Management Planned: Nasal Cannula  Additional Equipment:   Intra-op Plan:   Post-operative Plan:   Informed Consent: I have reviewed the patients History and Physical, chart, labs and discussed the procedure including the risks, benefits and alternatives for the proposed anesthesia with the patient or authorized representative who has indicated his/her understanding and acceptance.     Plan Discussed with:   Anesthesia Plan Comments:         Anesthesia Quick Evaluation

## 2015-02-03 NOTE — Progress Notes (Signed)
[facility] Physicians - [place] at [facility]   PATIENT NAME: Brandi Cline    MR#:  [id number]  DATE OF BIRTH:  09/09/1930    CHIEF COMPLAINT: SOB   Still has some SOB. Feels tired. Hemoptysis - [employer]. Edema present. Tunnel HD cath placed 01/31/2015 On 3 L O2 at home EGD/Colonoscopy today REVIEW OF SYSTEMS:  CONSTITUTIONAL: No fever, fatigue or weakness.  EYES: No blurred or double vision.  EARS, NOSE, AND THROAT: No tinnitus or ear pain.  RESPIRATORY: Positive SOB/hemoptysis CARDIOVASCULAR: No chest pain, orthopnea, edema.  GASTROINTESTINAL: Reports dark diarrhea.  No nausea, vomiting,  or abdominal pain.  GENITOURINARY: No dysuria, hematuria.  ENDOCRINE: No polyuria, nocturia,  HEMATOLOGY: No anemia, reports bruising . SKIN: No rash or lesion. Left leg  redness is  better MUSCULOSKELETAL: No joint pain or arthritis.   NEUROLOGIC: No tingling, numbness, weakness.  PSYCHIATRY: No anxiety or depression.  DRUG ALLERGIES:   Allergies  Allergen Reactions  . Avelox [Moxifloxacin Hcl In Nacl] Shortness Of Breath, Swelling and Rash  . Codeine Shortness Of Breath and Other (See Comments)    Couldn't breath  . Theophyllines     Slight increased heart rate  . Ciprofloxacin Other (See Comments)    nervous  . Milk-Related Compounds Other (See Comments)    Whole milk hurts stomach. Not all dairy products do this.  . Tape     Please use paper tape only  . Lisinopril Rash   VITALS:  Blood pressure 125/38, pulse 85, temperature 98.2 F (36.8 C), temperature source Oral, resp. rate 18, height  (1.626 m), weight 71.578 kg (157 lb 12.8 oz), SpO2 85 %. PHYSICAL EXAMINATION:  GENERAL:  81 y.o.-year-old patient lying in the bed with no acute distress.  EYES: Pupils equal, round, reactive to light and accommodation. No scleral icterus. Extraocular muscles intact.  HEENT: Head atraumatic, normocephalic. Oropharynx and nasopharynx clear.  NECK:  Supple, no  jugular venous distention. No thyroid enlargement, no tenderness.  LUNGS: B/L crackles CARDIOVASCULAR: S1, S2 normal. No murmurs, rubs, or gallops.  ABDOMEN: Soft, nontender, nondistended. Bowel sounds present. No organomegaly or mass.  EXTREMITIES: No  cyanosis, or clubbing. Temp dialysis catheter site is intact, no oozing noticed. LE edema 2+ NEUROLOGIC: Cranial nerves II through XII are intact. Muscle strength 5/5 in all extremities. Sensation intact. Gait not checked.  PSYCHIATRIC: The patient is alert and oriented x 3.  SKIN: No obvious rash, lesion, or ulcer. Left lower extremity with decreased  erythema and tenderness LABORATORY PANEL:   CBC  Recent Labs Lab 02/03/15 0709  WBC 3.2*  HGB 7.7*  HCT 24.2*  PLT 98*   ------------------------------------------------------------------------------------------------------------------  Chemistries   Recent Labs Lab 01/29/15 0603  02/02/15 0732  NA 141  < > 137  K 3.6  < > 4.0  CL 102  < > 96*  CO2 33*  < > 32  GLUCOSE 82  < > 94  BUN 16  < > 50*  CREATININE 2.57*  < > 5.42*  CALCIUM 8.3*  < > 8.4*  AST 48*  --   --   ALT 20  --   --   ALKPHOS 150*  --   --   BILITOT 2.8*  --   --   < > = values in this interval not displayed.  ASSESSMENT AND PLAN:   # Acute renal failure on CKD3  Renal biopsy prelim - ATN With volume overload with edema and shortness of breath at  the time of admission  Improved but still has crackles on exam and pedal edema.  CT chest showed bilateral pleural effusions Tunnel HD catheter placed 10/21 on lasix BID   #  Left lower extremity cellulitis:  Elevate the leg.   Cellulitis improved.  # Hemoptysis. CT chest showed a polyp vs mucus plug in bronchus. Appreciate pulmonary input - bronch planned tue/wed per their note.  #  Chronic respiratory failure on 3 L nasal cannula due to COPD due to smoking Oxygenation is stable at this time.    # Acute on chronic anemia:  Anemia, liver  cirrhosis, Crohn's disease concerned about causes for cirrhosis thrombocytopenia ( [employer] [place])Patient has guaiac-positive stools will need GI workup  EGD and colonoscopy on monday . Transfuse if < 7  # Anxiety: continue xanax as she takes this at home.  # Thrombocytopenia/ coagulopathy likely due to liver disease  And crohns disease Appreciate oncology recs Vit k sq given once 10/16 for coagulopathy Discontinued Lovenox. Discontinued heparin flushes and heparin given during dialysis.  F/u Hit panel and hepatitis labs  CODE STATUS: DNR ,discussed with patient and daughter and sister at bedside on admission.  Disposition - to ALF when stable.  All the records are reviewed and case discussed with Care Management/Social Worker. Management plans discussed with the patient, family and they are in agreement.  TOTAL TIME TAKING CARE OF THIS PATIENT: 35 minutes.   POSSIBLE D/C IN  2-3 DAYS, DEPENDING ON CLINICAL CONDITION.   [facility], Chayden Garrelts M.D on 02/03/2015 at 4:31 PM  Between 7am to 6pm - Pager - [phone].  After 6pm go to [email or web address] - password [email or web address] [facility]  [place] [facility]  Office  [phone]  CC: Primary care physician; [name], MD

## 2015-02-03 NOTE — Evaluation (Signed)
Physical Therapy Evaluation Patient Details Name: Brandi Cline MRN: [id number] DOB: July 07, 1930 Today's Date: 02/03/2015   History of Present Illness  Pt is an 81 y.o. female presenting to hospital 01/22/15 with SOB, LE edema, and L leg pain and redness.  Pt s/p 01/22/15 R femoral temporary dialysis catheter placement (now removed and pt s/p permcath R IJ 01/31/15).  Pt s/p renal biopsy 01/29/15.  Plan for EGD 02/03/15 and also bronch this week (defect R mainstem bronchus on CT).  Pt with recent hospital stay 9/30-10/5/16 for acute renal failure.  PMH includes CKD stage 3, COPD, chronic respiratory failure on 3-4 L/min via [place], CAD, Crohn's disease, htn.  Clinical Impression  Currently pt demonstrates impairments with general strength, balance, and limitations with functional mobility.  Prior to admission, pt was independent without AD.  Pt lives alone in 1 story home with 2 steps to enter with L railing.  Currently pt is CGA to min assist with transfers and ambulation with RW (pt unable to ambulate with UE support requiring RW; distance ambulated limited d/t SOB and pt desaturating to 85% on 4 L/min via nasal cannula--returned to 92% with sitting rest break and vc's for purse lip breathing technique).  Pt would benefit from skilled PT to address above noted impairments and functional limitations.  Currently recommend pt discharge to STR when medically appropriate.     Follow Up Recommendations SNF (pending progress)    Equipment Recommendations   (pt reports having RW at home)    Recommendations for Other Services       Precautions / Restrictions Precautions Precautions: Fall Restrictions Weight Bearing Restrictions: No      Mobility  Bed Mobility Overal bed mobility: Needs Assistance Bed Mobility: Supine to Sit;Sit to Supine     Supine to sit: Supervision;HOB elevated Sit to supine: Supervision;HOB elevated      Transfers Overall transfer level: Needs assistance Equipment  used: Rolling walker (2 wheeled) Transfers: Sit to/from Stand Sit to Stand: Min guard;Min assist         General transfer comment: occasional assist to steady  Ambulation/Gait Ambulation/Gait assistance: Min guard;Min assist Ambulation Distance (Feet): 60 Feet Assistive device: Rolling walker (2 wheeled) Gait Pattern/deviations: Step-through pattern Gait velocity: decreased   General Gait Details: decreased B step length/foot clearance/heelstrike; limited d/t SOB with distance  Stairs            Wheelchair Mobility    Modified Rankin (Stroke Patients Only)       Balance Overall balance assessment: Needs assistance Sitting-balance support: No upper extremity supported;Feet supported Sitting balance-Leahy Scale: Good     Standing balance support: No upper extremity supported Standing balance-Leahy Scale: Fair                               Pertinent Vitals/Pain Pain Assessment: No/denies pain    Home Living Family/patient expects to be discharged to:: Private residence Living Arrangements: Alone Available Help at Discharge: Family (Pt's sister lives next door and other sister lives a few blocks away) Type of Home: House Home Access: Stairs to enter Entrance Stairs-Rails: Left Entrance Stairs-Number of Steps: 2 Home Layout: One level Home Equipment: Environmental consultant - 2 wheels;Walker - 4 wheels;Cane - single point      Prior Function Level of Independence: Independent         Comments: Pt denies any falls and reports she does drive although her family took away her keys recently d/t  health concerns     Hand Dominance        Extremity/Trunk Assessment   Upper Extremity Assessment: Generalized weakness           Lower Extremity Assessment: Generalized weakness         Communication   Communication: HOH  Cognition Arousal/Alertness: Awake/alert Behavior During Therapy: WFL for tasks assessed/performed Overall Cognitive Status: Within  Functional Limits for tasks assessed                      General Comments   Nursing cleared pt for participation in physical therapy.  Pt agreeable to PT session.    Exercises        Assessment/Plan    PT Assessment Patient needs continued PT services  PT Diagnosis Generalized weakness;Difficulty walking   PT Problem List Decreased strength;Decreased activity tolerance;Decreased balance;Decreased mobility;Cardiopulmonary status limiting activity  PT Treatment Interventions DME instruction;Gait training;Stair training;Functional mobility training;Therapeutic activities;Therapeutic exercise;Balance training;Neuromuscular re-education;Patient/family education   PT Goals (Current goals can be found in the Care Plan section) Acute Rehab PT Goals Patient Stated Goal: to go home if possible PT Goal Formulation: With patient Time For Goal Achievement: 02/17/15 Potential to Achieve Goals: Fair    Frequency Min 2X/week   Barriers to discharge Decreased caregiver support      Co-evaluation               End of Session Equipment Utilized During Treatment: Gait belt;Oxygen Activity Tolerance: Patient limited by fatigue Patient left: in bed;with call bell/phone within reach;with bed alarm set           Time: 6945-0388 PT Time Calculation (min) (ACUTE ONLY): 22 min   Charges:   PT Evaluation $Initial PT Evaluation Tier I: 1 Procedure     PT G CodesHendricks Limes Feb 21, 2015, 2:28 PM Hendricks Limes, PT [phone]

## 2015-02-03 NOTE — Anesthesia Procedure Notes (Signed)
Date/Time: 02/03/2015 4:25 PM Performed by: Junious Silk Pre-anesthesia Checklist: Patient identified, Emergency Drugs available, Suction available, Patient being monitored and Timeout performed Oxygen Delivery Method: Nasal cannula

## 2015-02-03 NOTE — Transfer of Care (Signed)
Immediate Anesthesia Transfer of Care Note  Patient: Brandi Cline  Procedure(s) Performed: Procedure(s) with comments: ESOPHAGOGASTRODUODENOSCOPY (EGD) WITH PROPOFOL (N/A) - Look into the colon and stomach with a light to look for the source of bleeding. COLONOSCOPY WITH PROPOFOL (N/A)  Patient Location: PACU  Anesthesia Type:General  Level of Consciousness: sedated  Airway & Oxygen Therapy: Patient Spontanous Breathing  Post-op Assessment: Report given to RN and Post -op Vital signs reviewed and stable  Post vital signs: Reviewed and stable  Last Vitals:  Filed Vitals:   02/03/15 1421  BP:   Pulse: 85  Temp:   Resp:     Complications: No apparent anesthesia complications

## 2015-02-03 NOTE — Care Management (Signed)
Spoke with patient who is alert and oriented. Patient stated that she is afraid that her insurance benefit has "run out" Patient would like for me to discuss discharge plan with daughter Dois Davenport. Gave patient my contact information. Anticipating discharge to SNF .

## 2015-02-04 ENCOUNTER — Encounter: Payer: Self-pay | Admitting: Vascular Surgery

## 2015-02-04 LAB — CBC
HCT: 27.3 % — ABNORMAL LOW (ref 35.0–47.0)
Hemoglobin: 8.7 g/dL — ABNORMAL LOW (ref 12.0–16.0)
MCH: 29.2 pg (ref 26.0–34.0)
MCHC: 31.9 g/dL — AB (ref 32.0–36.0)
MCV: 91.3 fL (ref 80.0–100.0)
PLATELETS: 132 10*3/uL — AB (ref 150–440)
RBC: 2.99 MIL/uL — ABNORMAL LOW (ref 3.80–5.20)
RDW: 14.9 % — AB (ref 11.5–14.5)
WBC: 4.7 10*3/uL (ref 3.6–11.0)

## 2015-02-04 LAB — RENAL FUNCTION PANEL
ALBUMIN: 2.6 g/dL — AB (ref 3.5–5.0)
Anion gap: 12 (ref 5–15)
BUN: 60 mg/dL — AB (ref 6–20)
CHLORIDE: 95 mmol/L — AB (ref 101–111)
CO2: 30 mmol/L (ref 22–32)
CREATININE: 5.73 mg/dL — AB (ref 0.44–1.00)
Calcium: 8.2 mg/dL — ABNORMAL LOW (ref 8.9–10.3)
GFR calc Af Amer: 7 mL/min — ABNORMAL LOW (ref >60)
GFR, EST NON AFRICAN AMERICAN: 6 mL/min — AB (ref >60)
GLUCOSE: 182 mg/dL — AB (ref 65–99)
PHOSPHORUS: 5.1 mg/dL — AB (ref 2.5–4.6)
Potassium: 3.6 mmol/L (ref 3.5–5.1)
Sodium: 137 mmol/L (ref 135–145)

## 2015-02-04 NOTE — Care Management (Signed)
Spoke with daughter Dois Davenport who was present in the room POA. Also present in the room was patient sister Dennie Bible and daughter gave permission do discuss case with patient sister as well. Discussed current discharge plan with at this time is SNF per PT recommendation. Daughter stated that she was in agreement with this plan.  Discussed patient status as ARF Vs ESRD and that at this point it would be a "wait and see" for further diagnosis. Patient will need PRN dialysis and close nephrology follow up until determination diagnosis. Right now patient is ARF still. Daughter stated that she would be following up with patient insurer. I will contact insurer today concerning coverage.

## 2015-02-04 NOTE — Care Management (Signed)
Spoke with benefits at [employer] [phone] to confirm dialysis benefits.  Patient will need PRN dialysis for about 4 weeks with diagnosis of Acute Renal Failure until firm diagnosis.  Luster Landsberg will check with supervision to confirm benefit. Patient will likely discharge to SNF with dialysis.  Awaiting callback from [employer]. More to follow.

## 2015-02-04 NOTE — Progress Notes (Signed)
Subjective:  Had endoscopies yesterday. Apparently these were all normal. Seen during HD today. UF target 1-1.5kg.   Objective:  Vital signs in last 24 hours:  Temp:  [97.2 F (36.2 C)-99 F (37.2 C)] 98.1 F (36.7 C) (10/25 1115) Pulse Rate:  [66-85] 73 (10/25 1200) Resp:  [18-25] 21 (10/25 1200) BP: (106-135)/(33-46) 115/37 mmHg (10/25 1200) SpO2:  [85 %-100 %] 100 % (10/25 1200) Weight:  [70.8 kg (156 lb 1.4 oz)-72.44 kg (159 lb 11.2 oz)] 70.8 kg (156 lb 1.4 oz) (10/25 1115)  Weight change: 0.862 kg (1 lb 14.4 oz) Filed Weights   02/03/15 0500 02/04/15 0500 02/04/15 1115  Weight: 71.578 kg (157 lb 12.8 oz) 72.44 kg (159 lb 11.2 oz) 70.8 kg (156 lb 1.4 oz)    Intake/Output: I/O last 3 completed shifts: In: 200 [I.V.:200] Out: 200 [Urine:200]   Intake/Output this shift:  Total I/O In: 300 [P.O.:300] Out: -   Physical Exam: General: NAD  Head: Normocephalic, atraumatic. Moist oral mucosal membranes  Eyes: Anicteric  Neck: Supple, trachea midline  Lungs:  Clear to auscultation normal effort  Heart: Regular rate and rhythm no rubs  Abdomen:  Soft, nontender, BS present, distended   Extremities:  2+ peripheral edema.  Neurologic: Nonfocal, moving all four extremities  Skin: No lesions  Access: Right IJ dialysis catheter.    Basic Metabolic Panel:  Recent Labs Lab 01/29/15 0603 01/30/15 0454 02/01/15 1300 02/02/15 0732  NA 141 141 138 137  K 3.6 3.9 4.0 4.0  CL 102 103 96* 96*  CO2 33* 33* 31 32  GLUCOSE 82 104* 117* 94  BUN 16 26* 48* 50*  CREATININE 2.57* 3.53* 5.18* 5.42*  CALCIUM 8.3* 8.4* 8.9 8.4*    Liver Function Tests:  Recent Labs Lab 01/29/15 0603  AST 48*  ALT 20  ALKPHOS 150*  BILITOT 2.8*  PROT 5.4*  ALBUMIN 2.9*   No results for input(s): LIPASE, AMYLASE in the last 168 hours.  Recent Labs Lab 01/29/15 0603  AMMONIA 49*    CBC:  Recent Labs Lab 01/28/15 2008  01/31/15 0841 02/01/15 1300 02/02/15 0732  02/03/15 0709 02/04/15 0921  WBC 4.2  < > 5.0 4.9 3.1* 3.2* 4.7  NEUTROABS 3.1  --   --   --  1.7 2.0  --   HGB 8.2*  < > 8.7* 8.9* 7.6* 7.7* 8.7*  HCT 25.5*  < > 27.3* 27.8* 23.7* 24.2* 27.3*  MCV 90.9  < > 92.6 92.2 91.5 91.0 91.3  PLT 76*  < > 104* 116* 93* 98* 132*  < > = values in this interval not displayed.  Cardiac Enzymes: No results for input(s): CKTOTAL, CKMB, CKMBINDEX, TROPONINI in the last 168 hours.  BNP: Invalid input(s): POCBNP  CBG: No results for input(s): GLUCAP in the last 168 hours.  Microbiology: Results for orders placed or performed during the hospital encounter of 01/22/15  C difficile quick scan w PCR reflex     Status: None   Collection Time: 01/23/15  4:46 PM  Result Value Ref Range Status   C Diff antigen NEGATIVE NEGATIVE Final   C Diff toxin NEGATIVE NEGATIVE Final   C Diff interpretation Negative for C. difficile  Final    Coagulation Studies: No results for input(s): LABPROT, INR in the last 72 hours.  Urinalysis: No results for input(s): COLORURINE, LABSPEC, PHURINE, GLUCOSEU, HGBUR, BILIRUBINUR, KETONESUR, PROTEINUR, UROBILINOGEN, NITRITE, LEUKOCYTESUR in the last 72 hours.  Invalid input(s): APPERANCEUR    Imaging: No  results found.   Medications:   . anticoagulant sodium citrate     . arformoterol  15 mcg Nebulization BID  . epoetin (EPOGEN/PROCRIT) injection  20,000 Units Subcutaneous Weekly  . feeding supplement (ENSURE ENLIVE)  237 mL Oral Q24H  . pantoprazole  40 mg Oral Daily   acetaminophen **OR** acetaminophen, albuterol, ALPRAZolam, alteplase, anticoagulant sodium citrate, benzonatate, guaiFENesin-dextromethorphan, lidocaine (PF), lidocaine-prilocaine, loperamide, nitroGLYCERIN, ondansetron **OR** ondansetron (ZOFRAN) IV, pentafluoroprop-tetrafluoroeth, polyethylene glycol  Assessment/ Plan:  81 y.o. female with past medical history of coronary disease- with coronary stents, Hypertension, Crohn's disease, COPD, uses  home oxygen around the clock, Kidney stones- lithotripsy, ureteral stents, History of shingles, Arthritis, Hyperlipidemia, Partial resection of bowel for Crohn's disease, liver cirrhosis.   1.  Acute renal failure secondary to severe ATN/CKD stage III.  Baseline creatinine 1.28 in July 2016 The patient has had progressively worsening renal function since July of 2016.  EGFR was down to 6 as outpt most recently.Preliminary renal biopsy report show severe ATN and oxalate crystals. Possibly from overdiuresis in the setting of cirrhosis. Oxalate crystals are likely related to malabsorption from Crohn's disease - Pt seen during HD today, will draw labs to see what her BUN/Cr are now.  Will need to follow bun/cr trend.  We are hopeful that she may some renal recovery though this could be a slow process.  2.  Anemia of CKD/thrombocytopenia:  EGD and colonoscopy unrevealing. -Hgb up to 8.7, continue epogen 20000 units [place] weekly.  3.  SHPTH of renal origin: phos to be rechecked today.  4. Lower extremity Edema - UF target 1-1.5kg today to help with LE edema.     LOS: [place], Dee Maday 10/25/201612:23 PM

## 2015-02-04 NOTE — Progress Notes (Addendum)
[facility] - [place] at [facility]   PATIENT NAME: Brandi Cline    MR#:  [id number]  DATE OF BIRTH:  01-09-1931    CHIEF COMPLAINT: SOB  Tired. Wants to know when she will leave hospital REVIEW OF SYSTEMS:  CONSTITUTIONAL: No fever, fatigue or weakness.  EYES: No blurred or double vision.  EARS, NOSE, AND THROAT: No tinnitus or ear pain.  RESPIRATORY: Positive SOB/hemoptysis CARDIOVASCULAR: No chest pain, orthopnea, edema.  GASTROINTESTINAL: Reports dark diarrhea.  No nausea, vomiting,  or abdominal pain.  GENITOURINARY: No dysuria, hematuria.  ENDOCRINE: No polyuria, nocturia,  HEMATOLOGY: No anemia, reports bruising . SKIN: No rash or lesion. Left leg  redness is  better MUSCULOSKELETAL: No joint pain or arthritis.   NEUROLOGIC: No tingling, numbness, weakness.  PSYCHIATRY: No anxiety or depression.  DRUG ALLERGIES:   Allergies  Allergen Reactions  . Avelox [Moxifloxacin Hcl In Nacl] Shortness Of Breath, Swelling and Rash  . Codeine Shortness Of Breath and Other (See Comments)    Couldn't breath  . Theophyllines     Slight increased heart rate  . Ciprofloxacin Other (See Comments)    nervous  . Milk-Related Compounds Other (See Comments)    Whole milk hurts stomach. Not all dairy products do this.  . Tape     Please use paper tape only  . Lisinopril Rash   VITALS:  Blood pressure 123/41, pulse 75, temperature 98.2 F (36.8 C), temperature source Oral, resp. rate 22, height  (1.626 m), weight 69.7 kg (153 lb 10.6 oz), SpO2 98 %. PHYSICAL EXAMINATION:  GENERAL:  81 y.o.-year-old patient lying in the bed with no acute distress.  EYES: Pupils equal, round, reactive to light and accommodation. No scleral icterus. Extraocular muscles intact.  HEENT: Head atraumatic, normocephalic. Oropharynx and nasopharynx clear.  NECK:  Supple, no jugular venous distention. No thyroid enlargement, no tenderness.  LUNGS: B/L crackles CARDIOVASCULAR: S1,  S2 normal. No murmurs, rubs, or gallops.  ABDOMEN: Soft, nontender, nondistended. Bowel sounds present. No organomegaly or mass.  EXTREMITIES: No  cyanosis, or clubbing. Temp dialysis catheter site is intact, no oozing noticed. LE edema 2+ NEUROLOGIC: Cranial nerves II through XII are intact. Muscle strength 5/5 in all extremities. Sensation intact. Gait not checked.  PSYCHIATRIC: The patient is alert and oriented x 3.  SKIN: No obvious rash, lesion, or ulcer. Left lower extremity with decreased  erythema and tenderness LABORATORY PANEL:   CBC  Recent Labs Lab 02/04/15 0921  WBC 4.7  HGB 8.7*  HCT 27.3*  PLT 132*   ------------------------------------------------------------------------------------------------------------------  Chemistries   Recent Labs Lab 01/29/15 0603  02/04/15 1048  NA 141  < > 137  K 3.6  < > 3.6  CL 102  < > 95*  CO2 33*  < > 30  GLUCOSE 82  < > 182*  BUN 16  < > 60*  CREATININE 2.57*  < > 5.73*  CALCIUM 8.3*  < > 8.2*  AST 48*  --   --   ALT 20  --   --   ALKPHOS 150*  --   --   BILITOT 2.8*  --   --   < > = values in this interval not displayed.  ASSESSMENT AND PLAN:   # Acute renal failure on CKD3  Renal biopsy - ATN Possible overdiuresis with underlying cirrhosis Tunnel HD catheter placed 10/21 HD per nephro   #  Left lower extremity cellulitis:  Elevate the leg.   Cellulitis  improved.  # Hemoptysis. CT chest showed a polyp vs mucus plug in bronchus. Appreciate pulmonary input - bronch planned tomorrow @ 1  #  Chronic respiratory failure on 3 L nasal cannula due to COPD due to smoking Oxygenation is stable at this time.    # Acute on chronic anemia:  Anemia, liver cirrhosis, Crohn's disease concerned about causes for cirrhosis thrombocytopenia ( [place])Patient has guaiac-positive stools. EGD and colonoscopy performed showed nothing significant (WNL). Will Transfuse if < 7  # Anxiety: continue xanax as she takes this at  home.  # Thrombocytopenia/ coagulopathy likely due to liver disease  And crohns disease Appreciate oncology recs Vit k sq given once 10/16 for coagulopathy Discontinued Lovenox. Discontinued heparin flushes and heparin given during dialysis.  F/u Hit panel and hepatitis labs  CODE STATUS: DNR, discussed with patient and daughter and sister at bedside on admission.  Disposition - to ALF when stable.  All the records are reviewed and case discussed with Care Management/Social Worker. Management plans discussed with the patient, family and they are in agreement.  TOTAL TIME TAKING CARE OF THIS PATIENT: 35 minutes.   POSSIBLE D/C IN 1-2 DAYS, DEPENDING ON CLINICAL CONDITION.   [facility], Ziaire Hagos M.D on 02/04/2015 at 3:55 PM  Between 7am to 6pm - Pager - [phone].  After 6pm go to [email or web address] - password [email or web address] [facility]  [place] [facility]  Office  [phone]  CC: Primary care physician; [name], MD

## 2015-02-04 NOTE — Progress Notes (Signed)
Patient became short of breath after ambulating to bathroom.  Called respiratory therapy to give treatment.  Currently on 4L of O2.  Lennon Alstrom N  02/04/2015  7:00 AM

## 2015-02-04 NOTE — Progress Notes (Signed)
PT Cancellation Note  Patient Details Name: Brandi Cline MRN: [id number] DOB: Nov 01, 1930   Cancelled Treatment:    Reason Eval/Treat Not Completed: Patient at procedure or test/unavailable (Pt off floor at dialysis.)  Will re-attempt PT treatment session at a later date/time.   [name] 02/04/2015, 11:23 AM Hendricks Limes, PT [phone]

## 2015-02-04 NOTE — Progress Notes (Signed)
HD tx start 

## 2015-02-04 NOTE — Progress Notes (Signed)
Post hd tx 

## 2015-02-04 NOTE — Progress Notes (Signed)
Pre-hd tx 

## 2015-02-04 NOTE — Progress Notes (Signed)
HD tx completed.

## 2015-02-04 NOTE — Clinical Social Work Note (Signed)
Clinical Social Work Assessment  Patient Details  Name: Brandi Cline MRN: [id number] Date of Birth: 1930/09/07  Date of referral:  02/04/15               Reason for consult:  Facility Placement                Permission sought to share information with:  Facility Sport and exercise psychologist, Family Supports Permission granted to share information::  Yes, Verbal Permission Granted  Name::        Agency::     Relationship::     Contact Information:     Housing/Transportation Living arrangements for the past 2 months:  Single Family Home Source of Information:  Patient, Adult Children Patient Interpreter Needed:  None Criminal Activity/Legal Involvement Pertinent to Current Situation/Hospitalization:  No - Comment as needed Significant Relationships:  Adult Children Lives with:  Self Do you feel safe going back to the place where you live?  Yes Need for family participation in patient care:  No (Coment)  Care giving concerns:  Patient resides alone.   Social Worker assessment / plan:  CSW received consult to see patient due to PT recommending STR at discharge. CSW met with patient and her daughter this afternoon and patient was receptive to rehab after she discharges. CSW explained the bedsearch process and also that we will check with medicare [employer] regarding her STR benefits. Patient's daughter requested [facility] if possible as that facility is near patient's home. CSW provided support and answered all questions that patient and daughter had at this time.   Employment status:  Retired Nurse, adult PT Recommendations:  [place] / Referral to community resources:  [place]  Patient/Family's Response to care:  Patient and daughter expressed appreciation for CSW assistance.  Patient/Family's Understanding of and Emotional Response to Diagnosis, Current Treatment, and Prognosis:  Patient states she is willing to do  what is recommended so that she can get better faster.  Emotional Assessment Appearance:  Appears stated age Attitude/Demeanor/Rapport:   (pleasant and cooperative) Affect (typically observed):  Accepting, Appropriate Orientation:  Oriented to Self, Oriented to Place, Oriented to  Time, Oriented to Situation Alcohol / Substance use:  Not Applicable Psych involvement (Current and /or in the community):  No (Comment)  Discharge Needs  Concerns to be addressed:  Care Coordination Readmission within the last 30 days:  No Current discharge risk:  None Barriers to Discharge:  No Barriers Identified   Shela Leff, LCSW 02/04/2015, 4:21 PM

## 2015-02-05 ENCOUNTER — Inpatient Hospital Stay: Payer: Medicare HMO

## 2015-02-05 ENCOUNTER — Inpatient Hospital Stay: Payer: Medicare HMO | Admitting: Certified Registered Nurse Anesthetist

## 2015-02-05 ENCOUNTER — Encounter: Payer: Self-pay | Admitting: Internal Medicine

## 2015-02-05 ENCOUNTER — Encounter
Admission: AD | Disposition: A | Discharge status: Skilled Nursing Facility | Payer: Self-pay | Source: Ambulatory Visit | Attending: Internal Medicine

## 2015-02-05 DIAGNOSIS — R600 Localized edema: Secondary | ICD-10-CM

## 2015-02-05 DIAGNOSIS — R9389 Abnormal findings on diagnostic imaging of other specified body structures: Secondary | ICD-10-CM | POA: Insufficient documentation

## 2015-02-05 DIAGNOSIS — R938 Abnormal findings on diagnostic imaging of other specified body structures: Secondary | ICD-10-CM

## 2015-02-05 HISTORY — PX: ENDOBRONCHIAL ULTRASOUND: SHX5096

## 2015-02-05 LAB — BASIC METABOLIC PANEL
Anion gap: 8 (ref 5–15)
BUN: 25 mg/dL — AB (ref 6–20)
CHLORIDE: 99 mmol/L — AB (ref 101–111)
CO2: 33 mmol/L — ABNORMAL HIGH (ref 22–32)
CREATININE: 3.39 mg/dL — AB (ref 0.44–1.00)
Calcium: 8.2 mg/dL — ABNORMAL LOW (ref 8.9–10.3)
GFR, EST AFRICAN AMERICAN: 14 mL/min — AB (ref >60)
GFR, EST NON AFRICAN AMERICAN: 12 mL/min — AB (ref >60)
Glucose, Bld: 87 mg/dL (ref 65–99)
POTASSIUM: 3.4 mmol/L — AB (ref 3.5–5.1)
SODIUM: 140 mmol/L (ref 135–145)

## 2015-02-05 LAB — CBC
HCT: 24.2 % — ABNORMAL LOW (ref 35.0–47.0)
HEMOGLOBIN: 7.7 g/dL — AB (ref 12.0–16.0)
MCH: 28.8 pg (ref 26.0–34.0)
MCHC: 31.7 g/dL — AB (ref 32.0–36.0)
MCV: 90.9 fL (ref 80.0–100.0)
PLATELETS: 94 10*3/uL — AB (ref 150–440)
RBC: 2.67 MIL/uL — AB (ref 3.80–5.20)
RDW: 14.6 % — ABNORMAL HIGH (ref 11.5–14.5)
WBC: 3.2 10*3/uL — ABNORMAL LOW (ref 3.6–11.0)

## 2015-02-05 LAB — PROTIME-INR
INR: 1.6
PROTHROMBIN TIME: 19.2 s — AB (ref 11.4–15.0)

## 2015-02-05 SURGERY — ENDOBRONCHIAL ULTRASOUND (EBUS)
Anesthesia: General

## 2015-02-05 MED ORDER — DEXAMETHASONE SODIUM PHOSPHATE 4 MG/ML IJ SOLN: 8.0000 mg | Freq: Once | INTRAMUSCULAR | Status: DC | PRN

## 2015-02-05 MED ORDER — FENTANYL CITRATE (PF) 100 MCG/2ML IJ SOLN: 25.0000 ug | INTRAMUSCULAR | Status: DC | PRN

## 2015-02-05 MED ORDER — SUCCINYLCHOLINE CHLORIDE 20 MG/ML IJ SOLN
INTRAMUSCULAR | Status: DC | PRN
  Administered 2015-02-05: 60 mg via INTRAVENOUS

## 2015-02-05 MED ORDER — FENTANYL CITRATE (PF) 100 MCG/2ML IJ SOLN
INTRAMUSCULAR | Status: DC | PRN
  Administered 2015-02-05: 50 ug via INTRAVENOUS

## 2015-02-05 MED ORDER — PHENYLEPHRINE HCL 10 MG/ML IJ SOLN
INTRAMUSCULAR | Status: DC | PRN
  Administered 2015-02-05: 100 ug via INTRAVENOUS

## 2015-02-05 MED ORDER — LIDOCAINE HCL (CARDIAC) 20 MG/ML IV SOLN
INTRAVENOUS | Status: DC | PRN
  Administered 2015-02-05: 100 mg via INTRAVENOUS

## 2015-02-05 MED ORDER — PROPOFOL 10 MG/ML IV BOLUS
INTRAVENOUS | Status: DC | PRN
  Administered 2015-02-05 (×2): 50 mg via INTRAVENOUS
  Administered 2015-02-05: 100 mg via INTRAVENOUS

## 2015-02-05 MED ORDER — MIDAZOLAM HCL 2 MG/2ML IJ SOLN
INTRAMUSCULAR | Status: DC | PRN
  Administered 2015-02-05: 1 mg via INTRAVENOUS

## 2015-02-05 MED ORDER — DEXAMETHASONE SODIUM PHOSPHATE 4 MG/ML IJ SOLN
INTRAMUSCULAR | Status: DC | PRN
  Administered 2015-02-05: 10 mg via INTRAVENOUS

## 2015-02-05 MED ORDER — SODIUM CHLORIDE 0.9 % IV SOLN
INTRAVENOUS | Status: DC | PRN
  Administered 2015-02-05: 13:00:00 via INTRAVENOUS

## 2015-02-05 NOTE — Progress Notes (Signed)
Subjective:  Pt seen at bedside. Had HD yesterday. Will need to continue to follow Cr trend while off dialysis.  Objective:  Vital signs in last 24 hours:  Temp:  [98 F (36.7 C)-98.8 F (37.1 C)] 98 F (36.7 C) (10/26 0521) Pulse Rate:  [72-80] 76 (10/26 0521) Resp:  [14-32] 14 (10/26 0521) BP: (98-131)/(32-54) 110/32 mmHg (10/26 0521) SpO2:  [94 %-100 %] 94 % (10/26 0830) FiO2 (%):  [36 %] 36 % (10/26 0830) Weight:  [69.7 kg (153 lb 10.6 oz)-73.891 kg (162 lb 14.4 oz)] 73.891 kg (162 lb 14.4 oz) (10/26 0521)  Weight change: -1.64 kg (-3 lb 9.9 oz) Filed Weights   02/04/15 1115 02/04/15 1509 02/05/15 0521  Weight: 70.8 kg (156 lb 1.4 oz) 69.7 kg (153 lb 10.6 oz) 73.891 kg (162 lb 14.4 oz)    Intake/Output: I/O last 3 completed shifts: In: 53 [P.O.:420] Out: 1000 [Other:1000]   Intake/Output this shift:     Physical Exam: General: NAD  Head: Normocephalic, atraumatic. Moist oral mucosal membranes  Eyes: Anicteric  Neck: Supple, trachea midline  Lungs:  Clear to auscultation normal effort  Heart: Regular rate and rhythm no rubs  Abdomen:  Soft, nontender, BS present, distended   Extremities:  2+ peripheral edema.  Neurologic: Nonfocal, moving all four extremities  Skin: No lesions  Access: Right IJ dialysis catheter.    Basic Metabolic Panel:  Recent Labs Lab 01/30/15 0454 02/01/15 1300 02/02/15 0732 02/04/15 1048 02/05/15 0347  NA 141 138 137 137 140  K 3.9 4.0 4.0 3.6 3.4*  CL 103 96* 96* 95* 99*  CO2 33* 31 32 30 33*  GLUCOSE 104* 117* 94 182* 87  BUN 26* 48* 50* 60* 25*  CREATININE 3.53* 5.18* 5.42* 5.73* 3.39*  CALCIUM 8.4* 8.9 8.4* 8.2* 8.2*  PHOS  --   --   --  5.1*  --     Liver Function Tests:  Recent Labs Lab 02/04/15 1048  ALBUMIN 2.6*   No results for input(s): LIPASE, AMYLASE in the last 168 hours. No results for input(s): AMMONIA in the last 168 hours.  CBC:  Recent Labs Lab 02/01/15 1300 02/02/15 0732 02/03/15 0709  02/04/15 0921 02/05/15 0347  WBC 4.9 3.1* 3.2* 4.7 3.2*  NEUTROABS  --  1.7 2.0  --   --   HGB 8.9* 7.6* 7.7* 8.7* 7.7*  HCT 27.8* 23.7* 24.2* 27.3* 24.2*  MCV 92.2 91.5 91.0 91.3 90.9  PLT 116* 93* 98* 132* 94*    Cardiac Enzymes: No results for input(s): CKTOTAL, CKMB, CKMBINDEX, TROPONINI in the last 168 hours.  BNP: Invalid input(s): POCBNP  CBG: No results for input(s): GLUCAP in the last 168 hours.  Microbiology: Results for orders placed or performed during the hospital encounter of 01/22/15  C difficile quick scan w PCR reflex     Status: None   Collection Time: 01/23/15  4:46 PM  Result Value Ref Range Status   C Diff antigen NEGATIVE NEGATIVE Final   C Diff toxin NEGATIVE NEGATIVE Final   C Diff interpretation Negative for C. difficile  Final    Coagulation Studies:  Recent Labs  02/05/15 0347  LABPROT 19.2*  INR 1.60    Urinalysis: No results for input(s): COLORURINE, LABSPEC, PHURINE, GLUCOSEU, HGBUR, BILIRUBINUR, KETONESUR, PROTEINUR, UROBILINOGEN, NITRITE, LEUKOCYTESUR in the last 72 hours.  Invalid input(s): APPERANCEUR    Imaging: No results found.   Medications:   . anticoagulant sodium citrate     . arformoterol  15 mcg Nebulization BID  . epoetin (EPOGEN/PROCRIT) injection  20,000 Units Subcutaneous Weekly  . feeding supplement (ENSURE ENLIVE)  237 mL Oral Q24H  . pantoprazole  40 mg Oral Daily   acetaminophen **OR** acetaminophen, albuterol, ALPRAZolam, alteplase, anticoagulant sodium citrate, benzonatate, guaiFENesin-dextromethorphan, lidocaine (PF), lidocaine-prilocaine, loperamide, nitroGLYCERIN, ondansetron **OR** ondansetron (ZOFRAN) IV, pentafluoroprop-tetrafluoroeth, polyethylene glycol  Assessment/ Plan:  81 y.o. female with past medical history of coronary disease- with coronary stents, Hypertension, Crohn's disease, COPD, uses home oxygen around the clock, Kidney stones- lithotripsy, ureteral stents, History of shingles,  Arthritis, Hyperlipidemia, Partial resection of bowel for Crohn's disease, liver cirrhosis.   1.  Acute renal failure secondary to severe ATN/CKD stage III.  Baseline creatinine 1.28 in July 2016 The patient has had progressively worsening renal function since July of 2016.  EGFR was down to 6 as outpt most recently.Preliminary renal biopsy report show severe ATN and oxalate crystals. Possibly from overdiuresis in the setting of cirrhosis. Oxalate crystals are likely related to malabsorption from Crohn's disease - Pt had HD yesterday, no acute indication for HD today, will likely need dialysis 1-2 times per week.  Still considered acute kidney injury.  Discussed this with care management.   2.  Anemia of CKD/thrombocytopenia:  EGD and colonoscopy unrevealing. -Hgb down to 7.7, will continue epogen 20000 units [place] weekly at this time.  3.  SHPTH of renal origin: phos 5.1 and acceptable at the moment.  4. Lower extremity Edema - UF achieved yesterday was 1.0, her edema overall has improved from admission.    LOS: [place], Anacleto Batterman 10/26/20168:58 AM

## 2015-02-05 NOTE — Care Management Important Message (Signed)
Important Message  Patient Details  Name: Brandi Cline MRN: [id number] Date of Birth: Aug 12, 1930   Medicare Important Message Given:  Yes-third notification given    [name] 02/05/2015, 12:35 PM

## 2015-02-05 NOTE — Transfer of Care (Signed)
Immediate Anesthesia Transfer of Care Note  Patient: Brandi Cline  Procedure(s) Performed: Procedure(s): ENDOBRONCHIAL ULTRASOUND (N/A)  Patient Location: PACU  Anesthesia Type:General  Level of Consciousness: awake, alert  and oriented  Airway & Oxygen Therapy: Patient Spontanous Breathing and Patient connected to nasal cannula oxygen  Post-op Assessment: Report given to RN  Post vital signs: Reviewed and stable  Last Vitals:  Filed Vitals:   02/05/15 1359  BP: 124/42  Pulse: 81  Temp: 36.9 C  Resp: 28    Complications: No apparent anesthesia complications

## 2015-02-05 NOTE — Care Management (Signed)
Patient for bronchoscopy today. Contacted Ivor Reining at [facility] to discuss case and possibility of discharge with PR[facility] Hemodialysis. Nephrology is monitoring renal function after HD yesterday. More to follow.

## 2015-02-05 NOTE — Anesthesia Preprocedure Evaluation (Addendum)
Anesthesia Evaluation  Patient identified by MRN, date of birth, ID band Patient awake    Reviewed: Allergy & Precautions, NPO status , Patient's Chart, lab work & pertinent test results  Airway Mallampati: III  TM Distance: >3 FB Neck ROM: Limited    Dental  (+) Teeth Intact   Pulmonary shortness of breath and at rest, COPD,  COPD inhaler and oxygen dependent, former smoker,  Severe COPD.    + decreased breath sounds      Cardiovascular Exercise Tolerance: Poor hypertension, + angina with exertion + CAD and +CHF   Rhythm:Regular Rate:Normal     Neuro/Psych    GI/Hepatic GERD  Medicated and Controlled,(+) Cirrhosis       , Hx of Crohn's ds., cirrhosis, splenic varices.   Endo/Other    Renal/GU ESRF and DialysisRenal disease     Musculoskeletal   Abdominal (+)  Abdomen: soft.    Peds  Hematology  (+) anemia , Hb 7.7, plts 94.   Anesthesia Other Findings   Reproductive/Obstetrics                            Anesthesia Physical Anesthesia Plan  ASA: IV  Anesthesia Plan: General   Post-op Pain Management:    Induction: Intravenous  Airway Management Planned: Oral ETT  Additional Equipment:   Intra-op Plan:   Post-operative Plan: Extubation in OR  Informed Consent: I have reviewed the patients History and Physical, chart, labs and discussed the procedure including the risks, benefits and alternatives for the proposed anesthesia with the patient or authorized representative who has indicated his/her understanding and acceptance.     Plan Discussed with: CRNA  Anesthesia Plan Comments:        Anesthesia Quick Evaluation

## 2015-02-05 NOTE — Anesthesia Procedure Notes (Signed)
Procedure Name: Intubation Date/Time: 02/05/2015 1:19 PM Performed by: Ginger Carne Pre-anesthesia Checklist: Patient identified, Timeout performed, Emergency Drugs available, Suction available and Patient being monitored Patient Re-evaluated:Patient Re-evaluated prior to inductionOxygen Delivery Method: Circle system utilized Preoxygenation: Pre-oxygenation with 100% oxygen Intubation Type: IV induction Ventilation: Mask ventilation without difficulty Laryngoscope Size: Miller and 2 Grade View: Grade I Tube type: Oral Tube size: 8.5 mm Number of attempts: 1 Airway Equipment and Method: Stylet

## 2015-02-05 NOTE — Care Management Note (Signed)
Patient expressed her wishes last week to stay under the care of Dr. Thedore Mins at outpatient dialysis if needed at discharge and she also stated that her plans were to go to SNF at discharge in the [place] area and not in [place] (home address).  I have gathered all medical records and forwarded to [facility] [address]  I have also sent with the referral note from [place] CM in regards to contact at patients insurance company.  Patient will be AKI at discharge and not ESRD.    I[facility] Reining Dialysis Liaison  [phone]

## 2015-02-05 NOTE — Anesthesia Postprocedure Evaluation (Signed)
  Anesthesia Post-op Note  Patient: Brandi Cline  Procedure(s) Performed: Procedure(s): ENDOBRONCHIAL ULTRASOUND (N/A)  Anesthesia type:General  Patient location: PACU  Post pain: Pain level controlled  Post assessment: Post-op Vital signs reviewed, Patient's Cardiovascular Status Stable, Respiratory Function Stable, Patent Airway and No signs of Nausea or vomiting  Post vital signs: Reviewed and stable  Last Vitals:  Filed Vitals:   02/05/15 1359  BP: 124/42  Pulse: 81  Temp: 36.9 C  Resp: 28    Level of consciousness: awake, alert  and patient cooperative  Complications: No apparent anesthesia complications

## 2015-02-05 NOTE — Consult Note (Signed)
PULMONARY / CRITICAL CARE MEDICINE   Name: Brandi Cline MRN: [id number] DOB: 07/08/1930    ADMISSION DATE:  01/22/2015 CONSULTATION DATE:  02/02/15  REFERRING MD :  Dr. Elpidio Anis   CHIEF COMPLAINT:     Cough, hemoptysis, abnormal CT chest finding   HISTORY OF PRESENT ILLNESS    Patient is a 81 year old female past medical history of acute on chronic renal failure, possible cirrhosis, Crohn's disease, seen in consultation for abdomen the on chest CT. Patient was admitted on 01/22/2015 for shortness of breath, lower extremity edema, left leg pain. She has a history of stage III chronic kidney disease with a recent hospitalization and also a history of COPD with chronic respiratory failure on 3-4 L of nasal cannula. She initially was seen at nephrology office for concern to volume overload and left lower extremity cellulitis, she had not had any urination in 2-3 days prior to that she was urgently admitted to the hospital for acute on chronic renal failure. Since then she has developed a cough, thrombocytopenia, given her history of chronic anemia and history of Crohn's disease, GI was consult it and she is scheduled to have a colonoscopy on Monday, 02/03/2015. She also tested positive for blood in stool. Off note, patient also has a history of recurrent UTI, she was placed on "antibiotic" in July for her UTI that time, since then her creatinine has slowly been trending down, and she's been followed by nephrology, Dr. Thedore Mins. Patient states she is a former smoker, quit in 1992, smoked about 1-1.5 packs per day for 25 years. She endorses blood-tinged sputum, productive cough.    SIGNIFICANT EVENTS     PAST MEDICAL HISTORY    :  Past Medical History  Diagnosis Date  . Dyspnea   . Esophageal reflux   . CAD (coronary artery disease)   . Hypertension   . Crohn's disease (HCC)   . Allergic rhinitis   . COPD (chronic obstructive pulmonary disease) (HCC)   . History of kidney stones   .  History of shingles   . Trigeminal neuralgia   . Family history of anesthesia complication     " SISTER HAD BAD FEELINGS "  . Anginal pain (HCC)   . Anxiety     ' JUST FOR MY BREATHING "  . Arthritis     mild in hands  . Hyperlipidemia   . CKD (chronic kidney disease)    Past Surgical History  Procedure Laterality Date  . Coronary stents    . Lithotripsy    . Ureteral stent placement    . Cholecystectomy    . Total abdominal hysterectomy    . Partial resections large and small bowel for crohn's    . Breast lumps benign    . Left heart catheterization with coronary angiogram N/A 10/28/2011    Procedure: LEFT HEART CATHETERIZATION WITH CORONARY ANGIOGRAM;  Surgeon: Lesleigh Noe, MD;  Location: [facility] CATH LAB;  Service: Cardiovascular;  Laterality: N/A;  . Peripheral vascular catheterization N/A 01/31/2015    Procedure: Dialysis/Perma Catheter Insertion;  Surgeon: Renford Dills, MD;  Location: [facility] INVASIVE CV LAB;  Service: Cardiovascular;  Laterality: N/A;   Prior to Admission medications   Medication Sig Start Date End Date Taking? Authorizing Provider  albuterol (PROVENTIL HFA;VENTOLIN HFA) 108 (90 BASE) MCG/ACT inhaler Inhale 2 puffs into the lungs every 6 (six) hours as needed for wheezing or shortness of breath. 10/08/14  Yes Waymon Budge, MD  albuterol (PROVENTIL) (2.5 MG/3ML)  0.083% nebulizer solution Take 3 mLs (2.5 mg total) by nebulization every 4 (four) hours as needed. Shortness of breath 10/03/14  Yes Waymon Budge, MD  ALPRAZolam Prudy Feeler) 0.5 MG tablet TAKE ONE TABLET BY MOUTH THREE TIMES DAILY AS NEEDED FOR ANXIETY 01/16/15  Yes Dianne Dun, MD  arformoterol (BROVANA) 15 MCG/2ML NEBU Take 2 mLs (15 mcg total) by nebulization 2 (two) times daily. 08/14/14  Yes Waymon Budge, MD  BIOTIN 5000 PO Take 2 tablets by mouth daily.   Yes Historical Provider, MD  Cholecalciferol (VITAMIN D-3) 5000 UNITS TABS Take 1 tablet by mouth daily.   Yes Historical Provider, MD   isosorbide mononitrate (IMDUR) 30 MG 24 hr tablet TAKE TWO TABLETS BY MOUTH DAILY 08/13/14  Yes Lyn Records, MD  loratadine (CLARITIN) 10 MG tablet Take 10 mg by mouth daily as needed.  07/11/12  Yes Marden Noble, MD  magnesium oxide (MAG-OX) 400 MG tablet Take 400 mg by mouth daily.    Yes Historical Provider, MD  Multiple Vitamin (MULTIVITAMIN) tablet Take 1 tablet by mouth daily.    Yes Historical Provider, MD  nitroGLYCERIN (NITROSTAT) 0.4 MG SL tablet Place 1 tablet (0.4 mg total) under the tongue every 5 (five) minutes as needed. Chest pain 04/27/13  Yes Lyn Records, MD  omeprazole (PRILOSEC) 20 MG capsule TAKE ONE CAPSULE BY MOUTH DAILY 12/03/14  Yes Dianne Dun, MD  Zinc Acetate, Oral, (ZINC ACETATE PO) Take 1 tablet by mouth daily.   Yes Historical Provider, MD   Allergies  Allergen Reactions  . Avelox [Moxifloxacin Hcl In Nacl] Shortness Of Breath, Swelling and Rash  . Codeine Shortness Of Breath and Other (See Comments)    Couldn't breath  . Theophyllines     Slight increased heart rate  . Ciprofloxacin Other (See Comments)    nervous  . Milk-Related Compounds Other (See Comments)    Whole milk hurts stomach. Not all dairy products do this.  . Tape     Please use paper tape only  . Lisinopril Rash     FAMILY HISTORY   Family History  Problem Relation Age of Onset  . Breast cancer Sister   . COPD Sister   . Stroke Mother   . Prostate cancer Father   . Prostate cancer Brother   . Heart attack Brother       SOCIAL HISTORY    reports that she quit smoking about 24 years ago. Her smoking use included Cigarettes. She has a 30 pack-year smoking history. She has never used smokeless tobacco. She reports that she does not drink alcohol or use illicit drugs.  Review of Systems  Constitutional: Negative for fever and chills.  HENT: Negative for hearing loss and tinnitus.   Eyes: Negative for blurred vision and double vision.  Respiratory: Positive for cough and sputum  production.        Improving cough and hemoptysis  Cardiovascular: Positive for leg swelling.  Gastrointestinal: Negative for heartburn, nausea, vomiting and abdominal pain.  Genitourinary: Negative for dysuria.  Skin: Negative for itching and rash.  Neurological: Negative for headaches.      VITAL SIGNS    Temp:  [98 F (36.7 C)-98.8 F (37.1 C)] 98.4 F (36.9 C) (10/26 1359) Pulse Rate:  [73-86] 81 (10/26 1359) Resp:  [14-28] 28 (10/26 1359) BP: (110-131)/(32-54) 124/42 mmHg (10/26 1359) SpO2:  [90 %-100 %] 95 % (10/26 1359) FiO2 (%):  [36 %] 36 % (10/26 0830) Weight:  [964  lb 10.6 oz (69.7 kg)-162 lb 14.4 oz (73.891 kg)] 162 lb 14.4 oz (73.891 kg) (10/26 0521) HEMODYNAMICS:   VENTILATOR SETTINGS: Vent Mode:  [-]  FiO2 (%):  [36 %] 36 % INTAKE / OUTPUT:  Intake/Output Summary (Last 24 hours) at 02/05/15 1401 Last data filed at 02/05/15 1339  Gross per 24 hour  Intake    320 ml  Output   1000 ml  Net   -680 ml       PHYSICAL EXAM   Physical Exam  Constitutional: She is oriented to person, place, and time. She appears well-developed and well-nourished.  HENT:  Head: Normocephalic and atraumatic.  Right Ear: External ear normal.  Left Ear: External ear normal.  Mouth/Throat: Oropharynx is clear and moist.  Eyes: Pupils are equal, round, and reactive to light.  Neck: Normal range of motion.  Cardiovascular: Normal rate, regular rhythm, normal heart sounds and intact distal pulses.   Pulmonary/Chest: No respiratory distress. She has no wheezes. She exhibits no tenderness.  Mild fine wheezes  Abdominal: Soft. She exhibits no distension. There is no tenderness.  Musculoskeletal: Normal range of motion. She exhibits edema.  Neurological: She is alert and oriented to person, place, and time.  Skin: Skin is warm and dry.  Nursing note and vitals reviewed.      LABS   LABS:  CBC  Recent Labs Lab 02/03/15 0709 02/04/15 0921 02/05/15 0347  WBC 3.2*  4.7 3.2*  HGB 7.7* 8.7* 7.7*  HCT 24.2* 27.3* 24.2*  PLT 98* 132* 94*   Coag's  Recent Labs Lab 02/05/15 0347  INR 1.60   BMET  Recent Labs Lab 02/02/15 0732 02/04/15 1048 02/05/15 0347  NA 137 137 140  K 4.0 3.6 3.4*  CL 96* 95* 99*  CO2 32 30 33*  BUN 50* 60* 25*  CREATININE 5.42* 5.73* 3.39*  GLUCOSE 94 182* 87   Electrolytes  Recent Labs Lab 02/02/15 0732 02/04/15 1048 02/05/15 0347  CALCIUM 8.4* 8.2* 8.2*  PHOS  --  5.1*  --    Sepsis Markers No results for input(s): LATICACIDVEN, PROCALCITON, O2SATVEN in the last 168 hours. ABG No results for input(s): PHART, PCO2ART, PO2ART in the last 168 hours. Liver Enzymes  Recent Labs Lab 02/04/15 1048  ALBUMIN 2.6*   Cardiac Enzymes No results for input(s): TROPONINI, PROBNP in the last 168 hours. Glucose No results for input(s): GLUCAP in the last 168 hours.   No results found for this or any previous visit (from the past 240 hour(s)).   Current facility-administered medications:  .  acetaminophen (TYLENOL) tablet 650 mg, 650 mg, Oral, Q6H PRN, 650 mg at 02/01/15 2137 **OR** acetaminophen (TYLENOL) suppository 650 mg, 650 mg, Rectal, Q6H PRN, Gale Journey, MD .  albuterol (PROVENTIL) (2.5 MG/3ML) 0.083% nebulizer solution 2.5 mg, 2.5 mg, Nebulization, Q4H PRN, Gale Journey, MD, 2.5 mg at 02/04/15 0607 .  ALPRAZolam Prudy Feeler) tablet 0.5 mg, 0.5 mg, Oral, TID PRN, Gale Journey, MD, 0.5 mg at 02/04/15 2309 .  alteplase (CATHFLO ACTIVASE) injection 2 mg, 2 mg, Intracatheter, Once PRN, Munsoor Lateef, MD .  anticoagulant sodium citrate solution 5 mL, 5 mL, Dialysis, Continuous PRN, Mosetta Pigeon, MD, 5 mL at 01/27/15 1135 .  arformoterol (BROVANA) nebulizer solution 15 mcg, 15 mcg, Nebulization, BID, Gale Journey, MD, 15 mcg at 02/05/15 0830 .  benzonatate (TESSALON) capsule 100 mg, 100 mg, Oral, Q6H PRN, Milagros Loll, MD, 100 mg at 02/04/15 1938 .  epoetin alfa (EPOGEN,PROCRIT)  injection  20,000 Units, 20,000 Units, Subcutaneous, Weekly, Mosetta Pigeon, MD, 20,000 Units at 02/02/15 1259 .  feeding supplement (ENSURE ENLIVE) (ENSURE ENLIVE) liquid 237 mL, 237 mL, Oral, Q24H, Aruna Gouru, MD, 237 mL at 02/04/15 1600 .  guaiFENesin-dextromethorphan (ROBITUSSIN DM) 100-10 MG/5ML syrup 10 mL, 10 mL, Oral, Q6H PRN, Ramonita Lab, MD, 10 mL at 01/30/15 2104 .  lidocaine (PF) (XYLOCAINE) 1 % injection 5 mL, 5 mL, Intradermal, PRN, Munsoor Lateef, MD .  lidocaine-prilocaine (EMLA) cream 1 application, 1 application, Topical, PRN, Munsoor Lateef, MD .  loperamide (IMODIUM) capsule 2 mg, 2 mg, Oral, Q6H PRN, Oralia Manis, MD, 2 mg at 02/03/15 2239 .  nitroGLYCERIN (NITROSTAT) SL tablet 0.4 mg, 0.4 mg, Sublingual, Q5 min PRN, Gale Journey, MD .  ondansetron [facility]) tablet 4 mg, 4 mg, Oral, Q6H PRN, 4 mg at 02/03/15 0148 **OR** ondansetron (ZOFRAN) injection 4 mg, 4 mg, Intravenous, Q6H PRN, Gale Journey, MD, 4 mg at 02/05/15 1327 .  pantoprazole (PROTONIX) EC tablet 40 mg, 40 mg, Oral, Daily, Gale Journey, MD, 40 mg at 02/04/15 1056 .  pentafluoroprop-tetrafluoroeth (GEBAUERS) aerosol 1 application, 1 application, Topical, PRN, Munsoor Lateef, MD .  polyethylene glycol (MIRALAX / GLYCOLAX) packet 17 g, 17 g, Oral, Daily PRN, Gale Journey, MD  Facility-Administered Medications Ordered in Other Encounters:  .  0.9 %  sodium chloride infusion, , Intravenous, Continuous PRN, Ginger Carne, CRNA .  dexamethasone (DECADRON) injection, , , Anesthesia Intra-op, Ginger Carne, CRNA, 10 mg at 02/05/15 1327 .  fentaNYL (SUBLIMAZE) injection, , , Anesthesia Intra-op, Ginger Carne, CRNA, 50 mcg at 02/05/15 1319 .  lidocaine (cardiac) 100 mg/59ml (XYLOCAINE) 20 MG/ML injection 2%, , Intravenous, Anesthesia Intra-op, Ginger Carne, CRNA, 100 mg at 02/05/15 1319 .  midazolam (VERSED) injection, , , Anesthesia Intra-op, Ginger Carne, CRNA, 1 mg at 02/05/15 1312 .   phenylephrine (NEO-SYNEPHRINE) injection, , Intravenous, Anesthesia Intra-op, Ginger Carne, CRNA, 100 mcg at 02/05/15 1328 .  propofol (DIPRIVAN) 10 mg/mL bolus/IV push, , , Anesthesia Intra-op, Ginger Carne, CRNA, 50 mg at 02/05/15 1336 .  succinylcholine (ANECTINE) injection, , Intravenous, Anesthesia Intra-op, Ginger Carne, CRNA, 60 mg at 02/05/15 1319  IMAGING    No results found.    Indwelling Urinary Catheter continued, requirement due to   Reason to continue Indwelling Urinary Catheter for strict Intake/Output monitoring for hemodynamic instability   Central Line continued, requirement due to   Reason to continue [employer] Monitoring of central venous pressure or other hemodynamic parameters   Ventilator continued, requirement due to, resp failure    Ventilator Sedation RASS 0 to -2   Lines:  Cultures: Urine Cx 10/1>>no growth Urine Cx 9/28>>E. Coli  Antibiotics: Vanc  10/15>10/17  ASSESSMENT/PLAN  81 year old female past medical history of CAD, status post stents, hypertension, Crohn's disease, COPD on chronic O2, urethral stents, arthritis hyperlipidemia, Crohn's status post partial resection, liver cirrhosis seen in consultation for CT chest abnormality of the right mainstem.  CT chest abnormality - s\p bronch on 10/26 (see report in separate note) -most likely mucus plug on CT - no lesions or hemoptysis seen on bronchoscopy -BAL of RML done - f\u on results.   Bilateral pleural effusion -Volume overload  -Continue with diuresis and monitoring strict I's and O's -Patient does have a PermCath placed, however no urgent indication for dialysis at this time especially with improving kidney function.  COPD-emphysematous type -History of smoking, continue with supplemental oxygen to maintain saturations greater than 88% -Continue with  DuoNeb's while in the inpatient setting, may start back home inhalers closer to discharge  I have personally  obtained a history, examined the patient, evaluated laboratory and imaging results, formulated the assessment and plan and placed orders.  Pulmonary consult time-35 minutes  Stephanie Acre, MD [facility] Pager [phone] (please enter 7-digits) On Call Pager - [phone] (please enter 7-digits)   02/05/2015, 2:01 PM

## 2015-02-05 NOTE — Progress Notes (Addendum)
[facility] - [place] at [facility]   PATIENT NAME: Brandi Cline    MR#:  [id number]  DATE OF BIRTH:  07/23/1930    CHIEF COMPLAINT: SOB  Some anxiety for bronch today. No other issues. K low  REVIEW OF SYSTEMS:  CONSTITUTIONAL: No fever, fatigue or weakness.  EYES: No blurred or double vision.  EARS, NOSE, AND THROAT: No tinnitus or ear pain.  RESPIRATORY: Positive SOB/hemoptysis CARDIOVASCULAR: No chest pain, orthopnea, edema.  GASTROINTESTINAL: Reports dark diarrhea.  No nausea, vomiting,  or abdominal pain.  GENITOURINARY: No dysuria, hematuria.  ENDOCRINE: No polyuria, nocturia,  HEMATOLOGY: No anemia, reports bruising . SKIN: No rash or lesion. Left leg  redness is  better MUSCULOSKELETAL: No joint pain or arthritis.   NEUROLOGIC: No tingling, numbness, weakness.  PSYCHIATRY: No anxiety or depression.  DRUG ALLERGIES:   Allergies  Allergen Reactions  . Avelox [Moxifloxacin Hcl In Nacl] Shortness Of Breath, Swelling and Rash  . Codeine Shortness Of Breath and Other (See Comments)    Couldn't breath  . Theophyllines     Slight increased heart rate  . Ciprofloxacin Other (See Comments)    nervous  . Milk-Related Compounds Other (See Comments)    Whole milk hurts stomach. Not all dairy products do this.  . Tape     Please use paper tape only  . Lisinopril Rash   VITALS:  Blood pressure 125/37, pulse 74, temperature 98 F (36.7 C), temperature source Oral, resp. rate 16, height 5\' 4"  (1.626 m), weight 73.891 kg (162 lb 14.4 oz), SpO2 97 %. PHYSICAL EXAMINATION:  GENERAL:  81 y.o.-year-old patient lying in the bed with no acute distress.  EYES: Pupils equal, round, reactive to light and accommodation. No scleral icterus. Extraocular muscles intact.  HEENT: Head atraumatic, normocephalic. Oropharynx and nasopharynx clear.  NECK:  Supple, no jugular venous distention. No thyroid enlargement, no tenderness.  LUNGS: B/L crackles CARDIOVASCULAR:  S1, S2 normal. No murmurs, rubs, or gallops.  ABDOMEN: Soft, nontender, nondistended. Bowel sounds present. No organomegaly or mass.  EXTREMITIES: No  cyanosis, or clubbing. Temp dialysis catheter site is intact, no oozing noticed. LE edema 2+ NEUROLOGIC: Cranial nerves II through XII are intact. Muscle strength 5/5 in all extremities. Sensation intact. Gait not checked.  PSYCHIATRIC: The patient is alert and oriented x 3.  SKIN: No obvious rash, lesion, or ulcer. Left lower extremity with decreased  erythema and tenderness LABORATORY PANEL:   CBC  Recent Labs Lab 02/05/15 0347  WBC 3.2*  HGB 7.7*  HCT 24.2*  PLT 94*   ------------------------------------------------------------------------------------------------------------------  Chemistries   Recent Labs Lab 02/05/15 0347  NA 140  K 3.4*  CL 99*  CO2 33*  GLUCOSE 87  BUN 25*  CREATININE 3.39*  CALCIUM 8.2*    ASSESSMENT AND PLAN:   # Acute renal failure on CKD3  Renal biopsy - ATN Possible overdiuresis with underlying cirrhosis Tunnel HD catheter placed 10/21 HD per nephro - last HD on 10/25. Monitoring renal function off HD   #  Left lower extremity cellulitis:  Elevate the leg.   Cellulitis improved.  # Hemoptysis. CT chest showed a polyp vs mucus plug in bronchus. Appreciate pulmonary input - s/p bronch this afternoon showed no pathology, biopsy pending  #  Chronic respiratory failure on 3 L nasal cannula due to COPD due to smoking Oxygenation is stable at this time.    # Acute on chronic anemia:  Anemia, liver cirrhosis, Crohn's disease concerned about causes  for cirrhosis thrombocytopenia ( [place]) colonoscopy & EGD performed on 10/24 wnl,  Hb 7.7 today Transfuse if < 7  # Anxiety: continue xanax as she takes this at home.  # Thrombocytopenia/ coagulopathy likely due to liver disease  And crohns disease Appreciate oncology recs Vit k sq given once 10/16 for coagulopathy Discontinued  Lovenox. Discontinued heparin flushes and heparin given during dialysis.  F/u Hit panel and hepatitis labs  CODE STATUS: DNR, discussed with patient and daughter and sister at bedside on admission.  Disposition - to ALF when stable.  All the records are reviewed and case discussed with Care Management/Social Worker. Management plans discussed with the patient, family and they are in agreement.  TOTAL TIME TAKING CARE OF THIS PATIENT: 35 minutes.   POSSIBLE D/C IN 1-2 DAYS, DEPENDING ON CLINICAL CONDITION.   [name], Brandi Cline M.D on 02/05/2015 at 2:54 PM  Between 7am to 6pm - Pager - [phone].  After 6pm go to [email or web address] - password [email or web address] [facility]  [facility]  Office  [phone]  CC: Primary care physician; [name], MD

## 2015-02-05 NOTE — Progress Notes (Signed)
Physical Therapy Treatment Patient Details Name: Brandi Cline MRN: [id number] DOB: [birth year] Today's Date: 02/05/2015    History of Present Illness Pt is an 81 y.o. female presenting to hospital 01/22/15 with SOB, LE edema, and L leg pain and redness.  Pt s/p 01/22/15 R femoral temporary dialysis catheter placement (now removed and pt s/p permcath R IJ 01/31/15).  Pt s/p renal biopsy 01/29/15.  Plan for EGD 02/03/15 and also bronch this week (defect R mainstem bronchus on CT).  Pt with recent hospital stay 9/30-10/5/16 for acute renal failure.  PMH includes CKD stage 3, COPD, chronic respiratory failure on 3-4 L/min via [place], CAD, Crohn's disease, htn.    PT Comments    Pt ambulated 80 feet with RW CGA; limited distance d/t SOB with distance (O2 90% on 4 L/min via nasal cannula with activity).  Pt demonstrates impaired activity tolerance.  Continue to recommend pt discharge to [facility].   Follow Up Recommendations  SNF     Equipment Recommendations   (pt reports having RW at home)    Recommendations for Other Services       Precautions / Restrictions Precautions Precautions: Fall Restrictions Weight Bearing Restrictions: No    Mobility  Bed Mobility Overal bed mobility: Modified Independent Bed Mobility: Supine to Sit;Sit to Supine     Supine to sit: Modified independent (Device/Increase time);HOB elevated Sit to supine: Modified independent (Device/Increase time);HOB elevated      Transfers Overall transfer level: Needs assistance Equipment used: Rolling walker (2 wheeled) Transfers: Sit to/from Stand Sit to Stand: Min guard            Ambulation/Gait Ambulation/Gait assistance: Min guard Ambulation Distance (Feet): 80 Feet Assistive device: Rolling walker (2 wheeled) Gait Pattern/deviations: Step-through pattern Gait velocity: decreased   General Gait Details: decreased B step length/foot clearance/heelstrike; limited d/t SOB with distance   Stairs             Wheelchair Mobility    Modified Rankin (Stroke Patients Only)       Balance Overall balance assessment: Needs assistance Sitting-balance support: No upper extremity supported;Feet supported Sitting balance-Leahy Scale: Normal     Standing balance support: Bilateral upper extremity supported (on RW) Standing balance-Leahy Scale: Fair                      Cognition Arousal/Alertness: Awake/alert Behavior During Therapy: WFL for tasks assessed/performed Overall Cognitive Status: Within Functional Limits for tasks assessed                      Exercises   Performed semi-supine B LE therapeutic exercise x 10 reps:  Ankle pumps (AROM B LE's); quad sets x3 second holds (AROM B LE's); glute squeezes x3 second holds (AROM B); SAQ's (AROM R; AROM L); heelslides (AROM R; AROM L), hip abd/adduction (AROM R; AROM L).  Pt required vc's and tactile cues for correct technique with exercises.     General Comments   Nursing cleared pt for participation in physical therapy.  Pt agreeable to PT session.      Pertinent Vitals/Pain Pain Assessment: No/denies pain    Home Living                      Prior Function            PT Goals (current goals can now be found in the care plan section) Acute Rehab PT Goals Patient Stated Goal: to go home  if possible PT Goal Formulation: With patient Time For Goal Achievement: 02/17/15 Potential to Achieve Goals: Fair Progress towards PT goals: Progressing toward goals    Frequency  Min 2X/week    PT Plan Current plan remains appropriate    Co-evaluation             End of Session Equipment Utilized During Treatment: Gait belt;Oxygen Activity Tolerance: Patient limited by fatigue Patient left: in bed;with call bell/phone within reach;with bed alarm set     Time: 1610-9604 PT Time Calculation (min) (ACUTE ONLY): 28 min  Charges:  $Gait Training: 8-22 mins $Therapeutic Exercise: 8-22 mins                     G CodesHendricks Limes 03/02/2015, 11:19 AM Hendricks Limes, PT [phone]

## 2015-02-05 NOTE — Progress Notes (Signed)
SNF and Non-Emergent EMS Transport Benefits:  Number called: [phone] Rep: Mardene Celeste Reference Number: [id number]  [place] Medicare PPO plan active as of 04/12/14 with no deductible.  Out of pocket max is $4950, of which [phone] met so far.  In-network SNF: $0 copay for days 1-20 and a $160 daily copay for days 21-100.  Limited to 100 days per year. Per rep, all 100 remain at this time.   Josem Kaufmann is required: [phone].    Non-emergent EMS transport: $250 copay for each one way medically necessary, Medicare covered trip.  Josem Kaufmann is not required.

## 2015-02-05 NOTE — Op Note (Signed)
[facility] Patient Name: Brandi Cline Procedure Date: 02/05/2015 1:08 PM MRN: [id number] Account #: [id number] Date of Birth: [birth year] Admit Type: Inpatient Age: 81 Room: Bronch Suite on 2nd floor Gender: Female Note Status: Finalized Attending MD: Stephanie Acre,  Procedure:         Bronchoscopy Indications:       Right upper lobe nodule Providers:         Stephanie Acre, [place] Chard, Admin. Radiographer, therapeutic) Referring MD:       Medicines:         Monitored Anesthesia Care Complications:     No immediate complications Procedure:         Pre-Anesthesia Assessment:                    - A History and Physical has been performed. Patient meds                     and allergies have been reviewed. The risks and benefits                     of the procedure and the sedation options and risks were                     discussed with the patient. All questions were answered                     and informed consent was obtained. Patient identification                     and proposed procedure were verified prior to the                     procedure. Mental Status Examination: normal. After                     reviewing the risks and benefits, the patient was deemed                     in satisfactory condition to undergo the procedure. The                     anesthesia plan was to use moderate sedation / analgesia                     (conscious sedation). Immediately prior to administration                     of medications, the patient was re-assessed for adequacy                     to receive sedatives. The heart rate, respiratory rate,                     oxygen saturations, blood pressure, adequacy of pulmonary                     ventilation, and response to care were monitored                     throughout the procedure. The physical status of the                     patient was re-assessed after the procedure.  After obtaining informed consent, the  bronchoscope was                     passed under direct vision. Throughout the procedure, the                     patient's blood pressure, pulse, and oxygen saturations                     were monitored continuously. the Bronchoscope Olympus                     BF-Q180 S# [id number] was introduced through the mouth, via                     the endotracheal tube (the patient was intubated for the                     procedure) and advanced to the trachea. The procedure was                     accomplished without difficulty. Findings:      The oropharynx appears normal. The larynx appears normal. The vocal       cords move normally with phonation and breathing. The subglottic space       is normal. The trachea is of normal caliber. The carina is sharp. The       tracheobronchial tree was examined to at least the first subsegmental       level. Bronchial mucosa and anatomy are normal; there are no       endobronchial lesions, and no secretions.      Bronchoalveolar lavage was performed in the RML medial segment (B5) of       the lung and sent for cell count, bacterial culture, viral smears &       culture, and fungal & AFB analysis. 40 mL of fluid were instilled. 15 mL       were returned. The return was cloudy. Mucous plugs were present in the       return fluid Impression:        - The examination was normal, there were no endobronchial                     lesion noted throughtout the exam.                    -Thick, scan mucus seen in the RMS and RBI                    - BAL of the RMS                    - No specimens collected. Recommendation:    - Await BAL results. Brandi Cline,  02/05/2015 1:45:36 PM Number of Addenda: 0 Note Initiated On: 02/05/2015 1:08 PM      [facility]

## 2015-02-05 NOTE — Progress Notes (Signed)
Speech Therapy Note: received order, reviewed chart notes. Met w/ pt and NSG. Pt is NPO currently awaiting a bronchoscopy today. Pt indicated difficulty swallowing w/ solids moreso than liquids. Pt also indicated she had Crohn's Dis. ST services will f/u w/ pt tomorrow when pt is on an oral diet again. Rec'd pt enjoy soups and other foods w/ moisture to lessen swallowing discomfort; pt agreed.

## 2015-02-06 ENCOUNTER — Encounter: Payer: Self-pay | Admitting: Gastroenterology

## 2015-02-06 LAB — BASIC METABOLIC PANEL
Anion gap: 13 (ref 5–15)
BUN: 35 mg/dL — ABNORMAL HIGH (ref 6–20)
CHLORIDE: 98 mmol/L — AB (ref 101–111)
CO2: 29 mmol/L (ref 22–32)
CREATININE: 4.41 mg/dL — AB (ref 0.44–1.00)
Calcium: 8.8 mg/dL — ABNORMAL LOW (ref 8.9–10.3)
GFR calc non Af Amer: 9 mL/min — ABNORMAL LOW (ref >60)
GFR, EST AFRICAN AMERICAN: 10 mL/min — AB (ref >60)
Glucose, Bld: 186 mg/dL — ABNORMAL HIGH (ref 65–99)
Potassium: 4.1 mmol/L (ref 3.5–5.1)
Sodium: 140 mmol/L (ref 135–145)

## 2015-02-06 NOTE — Evaluation (Signed)
Clinical/Bedside Swallow Evaluation Patient Details  Name: Brandi Cline MRN: [id number] Date of Birth: 1930-11-02  Today's Date: 02/06/2015 Time: SLP Start Time (ACUTE ONLY): 1308 SLP Stop Time (ACUTE ONLY): 1408 SLP Time Calculation (min) (ACUTE ONLY): 60 min  Past Medical History:  Past Medical History  Diagnosis Date  . Dyspnea   . Esophageal reflux   . CAD (coronary artery disease)   . Hypertension   . Crohn's disease (HCC)   . Allergic rhinitis   . COPD (chronic obstructive pulmonary disease) (HCC)   . History of kidney stones   . History of shingles   . Trigeminal neuralgia   . Family history of anesthesia complication     " SISTER HAD BAD FEELINGS "  . Anginal pain (HCC)   . Anxiety     ' JUST FOR MY BREATHING "  . Arthritis     mild in hands  . Hyperlipidemia   . CKD (chronic kidney disease)    Past Surgical History:  Past Surgical History  Procedure Laterality Date  . Coronary stents    . Lithotripsy    . Ureteral stent placement    . Cholecystectomy    . Total abdominal hysterectomy    . Partial resections large and small bowel for crohn's    . Breast lumps benign    . Left heart catheterization with coronary angiogram N/A 10/28/2011    Procedure: LEFT HEART CATHETERIZATION WITH CORONARY ANGIOGRAM;  Surgeon: Lesleigh Noe, MD;  Location: [facility] CATH LAB;  Service: Cardiovascular;  Laterality: N/A;  . Peripheral vascular catheterization N/A 01/31/2015    Procedure: Dialysis/Perma Catheter Insertion;  Surgeon: Renford Dills, MD;  Location: [facility] INVASIVE CV LAB;  Service: Cardiovascular;  Laterality: N/A;  . Endobronchial ultrasound N/A 02/05/2015    Procedure: ENDOBRONCHIAL ULTRASOUND;  Surgeon: Stephanie Acre, MD;  Location: [facility] ORS;  Service: Cardiopulmonary;  Laterality: N/A;  . Esophagogastroduodenoscopy (egd) with propofol N/A 02/03/2015    Procedure: ESOPHAGOGASTRODUODENOSCOPY (EGD) WITH PROPOFOL;  Surgeon: Midge Minium, MD;  Location: [facility] ENDOSCOPY;   Service: Endoscopy;  Laterality: N/A;  Look into the colon and stomach with a light to look for the source of bleeding.  . Colonoscopy with propofol N/A 02/03/2015    Procedure: COLONOSCOPY WITH PROPOFOL;  Surgeon: Midge Minium, MD;  Location: [facility] ENDOSCOPY;  Service: Endoscopy;  Laterality: N/A;   HPI:  pt is a 81 y.o. female with a known history of chronic kidney disease stage III who was recently hospitalized from 9/30-10/5 for acute renal failure, also with COPD and chronic respiratory failure on 3-4 L of nasal cannula, coronary artery disease, Crohn's disease, hypertension who presents today from Dr. Doristine Church office due to concern of volume overload and left lower extremity cellulitis. She reports that she has been increasingly short of breath with the sensation of fluid in her lungs. Both legs have continued to swell and the left leg has now become red and very tender. She has had chills, no fever recurs or diaphoresis. No nausea or vomiting but her appetite is decreased. She has chronic diarrhea which has not changed. She has a chronic cough productive of small amounts of sputum this has not changed. She is being admitted for treatment of cellulitis as well as possible temporary hemodialysis to remove fluid.   Assessment / Plan / Recommendation Clinical Impression  Pt appears at reduced risk for aspiration at this time w/ po diet of regular and thin liquids. Pt consumed trials of thin liquids (solid foods  at lunch meal) w/ no overt s/s of aspiration noted; no oral phase deficits noted. Pt fed self following general aspiration precautions and denied any trouble swallowing w/ her meal earlier; pt stated she had "inconsistent" Esophageal discomfort when eating certain foods such as rice(solids). Pt stated she just stops eating and gives time for it to "pass". She does not feel this is a consistent problem. Pt appears at her baseline at this time. No further skilled ST services indicated currently. NSG to  reconsult if any change in status or any need for education. Rec. f/u w/ GI consult if s/s of Esophageal dysmotility increase. Thorough amount of time spent discussing Esophageal dysmotility, GERD precautions, diet options and diet preparation. Pt/NSG agreed.     Aspiration Risk   (reduced)    Diet Recommendation Age appropriate regular solids;Thin   Medication Administration: Whole meds with liquid Compensations: Slow rate;Small sips/bites    Other  Recommendations Recommended Consults: Consider GI evaluation (as desired) Oral Care Recommendations: Oral care BID;Patient independent with oral care   Follow Up Recommendations       Frequency and Duration        Pertinent Vitals/Pain denied    SLP Swallow Goals  n/a   Swallow Study Prior Functional Status   lived at home independently.    General Date of Onset: 02/03/15 Other Pertinent Information: pt is a 81 y.o. female with a known history of chronic kidney disease stage III who was recently hospitalized from 9/30-10/5 for acute renal failure, also with COPD and chronic respiratory failure on 3-4 L of nasal cannula, coronary artery disease, Crohn's disease, hypertension who presents today from Dr. Doristine Church office due to concern of volume overload and left lower extremity cellulitis. She reports that she has been increasingly short of breath with the sensation of fluid in her lungs. Both legs have continued to swell and the left leg has now become red and very tender. She has had chills, no fever recurs or diaphoresis. No nausea or vomiting but her appetite is decreased. She has chronic diarrhea which has not changed. She has a chronic cough productive of small amounts of sputum this has not changed. She is being admitted for treatment of cellulitis as well as possible temporary hemodialysis to remove fluid. Type of Study: Bedside swallow evaluation Previous Swallow Assessment: none Diet Prior to this Study: Regular;Thin  liquids Temperature Spikes Noted: No Respiratory Status: Supplemental O2 delivered via (comment) ([place]) History of Recent Intubation: No Behavior/Cognition: Alert;Cooperative;Pleasant mood Oral Cavity - Dentition: Adequate natural dentition/normal for age Self-Feeding Abilities: Able to feed self Patient Positioning: Upright in bed Baseline Vocal Quality: Normal Volitional Cough: Strong Volitional Swallow: Able to elicit    Oral/Motor/Sensory Function Overall Oral Motor/Sensory Function: Appears within functional limits for tasks assessed Labial ROM: Within Functional Limits Labial Symmetry: Within Functional Limits Labial Strength: Within Functional Limits Lingual ROM: Within Functional Limits Lingual Symmetry: Within Functional Limits Lingual Strength: Within Functional Limits Facial Symmetry: Within Functional Limits Mandible: Within Functional Limits   Ice Chips     Thin Liquid Thin Liquid: Within functional limits Presentation: Cup;Self Fed    Nectar Thick Nectar Thick Liquid: Not tested   Honey Thick Honey Thick Liquid: Not tested   Puree Puree: Not tested   Solid   GO    Solid: Not tested      Jerilynn Som, MS, CCC-SLP  Watson,Katherine 02/06/2015,3:40 PM

## 2015-02-06 NOTE — Progress Notes (Signed)
[facility] [place] at [facility]   PATIENT NAME: Brandi Cline    MR#:  [id number]  DATE OF BIRTH:  1930/12/09  SUBJECTIVE:  CHIEF COMPLAINT:  No chief complaint on file.  - Creatinine worsening off of dialysis. Making minimal amounts of urine. Not on diuretics. -Dimensional 4 L oxygen, feels much better. Appears to be dyspneic after ambulation or minimal exertion though. -Has right chest dialysis catheter.  REVIEW OF SYSTEMS:  Review of Systems  Constitutional: Negative for fever and chills.  HENT: Negative for ear discharge, ear pain and tinnitus.   Eyes: Negative for blurred vision and double vision.  Respiratory: Positive for shortness of breath. Negative for cough and wheezing.   Cardiovascular: Positive for orthopnea and leg swelling. Negative for chest pain and palpitations.  Gastrointestinal: Negative for heartburn, nausea, vomiting, abdominal pain, diarrhea and constipation.  Genitourinary: Negative for dysuria and urgency.  Musculoskeletal: Negative for myalgias.  Neurological: Positive for weakness. Negative for dizziness, tingling, tremors, seizures and headaches.  Psychiatric/Behavioral: Negative for depression.    DRUG ALLERGIES:   Allergies  Allergen Reactions  . Avelox [Moxifloxacin Hcl In Nacl] Shortness Of Breath, Swelling and Rash  . Codeine Shortness Of Breath and Other (See Comments)    Couldn't breath  . Theophyllines     Slight increased heart rate  . Ciprofloxacin Other (See Comments)    nervous  . Milk-Related Compounds Other (See Comments)    Whole milk hurts stomach. Not all dairy products do this.  . Tape     Please use paper tape only  . Lisinopril Rash    VITALS:  Blood pressure 126/48, pulse 78, temperature 98.5 F (36.9 C), temperature source Oral, resp. rate 17, height  (1.626 m), weight 74.2 kg (163 lb 9.3 oz), SpO2 96 %.  PHYSICAL EXAMINATION:  Physical Exam  GENERAL:  81 y.o.-year-old patient lying  in the bed with no acute distress.  Patient just walked from bathroom and a little short of breath EYES: Pupils equal, round, reactive to light and accommodation. No scleral icterus. Extraocular muscles intact.  HEENT: Head atraumatic, normocephalic. Oropharynx and nasopharynx clear.  NECK:  Supple, no jugular venous distention. No thyroid enlargement, no tenderness.  LUNGS: Normal breath sounds bilaterally, no wheezing, or rhonchi. Some crackles at bases. No use of accessory muscles of respiration.  CARDIOVASCULAR: S1, S2 normal. No rubs, or gallops. 3/6 systolic murmur heard ABDOMEN: Soft, nontender, nondistended. Bowel sounds present. No organomegaly or mass.  EXTREMITIES: still has 2+ pedal edema on the foot. No cyanosis, or clubbing.  NEUROLOGIC: Cranial nerves II through XII are intact. Muscle strength 5/5 in all extremities. Sensation intact. Gait not checked.  PSYCHIATRIC: The patient is alert and oriented x 3.  SKIN: No obvious rash, lesion, or ulcer.    LABORATORY PANEL:   CBC  Recent Labs Lab 02/05/15 0347  WBC 3.2*  HGB 7.7*  HCT 24.2*  PLT 94*   ------------------------------------------------------------------------------------------------------------------  Chemistries   Recent Labs Lab 02/06/15 0940  NA 140  K 4.1  CL 98*  CO2 29  GLUCOSE 186*  BUN 35*  CREATININE 4.41*  CALCIUM 8.8*   ------------------------------------------------------------------------------------------------------------------  Cardiac Enzymes No results for input(s): TROPONINI in the last 168 hours. ------------------------------------------------------------------------------------------------------------------  RADIOLOGY:  No results found.  EKG:   Orders placed or performed during the hospital encounter of 01/22/15  . EKG 12-Lead  . EKG 12-Lead  . EKG 12-Lead  . EKG 12-Lead  . EKG 12-Lead  . EKG  12-Lead    ASSESSMENT AND PLAN:   # Acute renal failure on CKD3   -Renal biopsy - ATN and oxalate crystals - Likely ATN from overdiuresis with underlying cirrhosis - s/p dialysis catheter placed 10/21 and had 3 sessions of HD - last HD on 10/25. Now creatinine worsening off dialysis, will need dialysis again- tomorrow - might be progressing to ESRD, but for now considered to ARF - monitor I&O, no lasix for now per nephrology  # Left lower extremity cellulitis and edema- Improving - not on ABX now - elevate the legs   # Chronic respiratory failure on 3 L nasal cannula due to COPD due to smoking-3 of emphysema on CT chest. -Follows with pulmonologist as outpatient. -Also possibility of alpha-1 antitrypsin deficiency being considered as outpatient. -Currently on 4 L oxygen. Bronchoscopy done yesterday with no significant findings. Lavage cultures are pending - CXR with pulm edema   # Acute on chronic anemia: Anemia of chronic disease. -colonoscopy & EGD performed on 10/24 wnl, -Transfuse if < 7 -Patient can be on Epogen with dialysis. Was also following at the cancer center in the past for darbepoetin  # Anxiety: continue xanax as she takes this at home.  # Thrombocytopenia/ coagulopathy likely due to liver disease And crohns disease Appreciate oncology recs Discontinued Lovenox. Discontinued heparin flushes and heparin given during dialysis.  Monitor plts, no indication for transfusion as long as there are greater than 20 K or active bleeding  Physical Therapy consult requested  All the records are reviewed and case discussed with Care Management/Social Workerr. Management plans discussed with the patient, family and they are in agreement.  CODE STATUS: DNR  TOTAL TIME TAKING CARE OF THIS PATIENT: 38 minutes.   POSSIBLE D/C IN 2-3 DAYS, DEPENDING ON CLINICAL CONDITION.   Jamaar Howes M.D on 02/06/2015 at 1:22 PM  Between 7am to 6pm - Pager - [phone]  After 6pm go to [email or web address] - password [email or web address] [facility]  [place]  [facility]  Office  [phone]  CC: Primary care physician; [name], MD

## 2015-02-06 NOTE — Progress Notes (Signed)
PT Cancellation Note  Patient Details Name: Brandi Cline MRN: [id number] DOB: [birth year]   Cancelled Treatment:    Reason Eval/Treat Not Completed: Patient declined, no reason specified (Pt reports ambulating with nursing earlier this afternoon and was tired and wanted to rest).  Pt s/p bronch 02/05/15 with general anesthesia (new PT consult received today).  Will re-attempt PT session at a later date/time.   Irving Burton Lincoln Kleiner 02/06/2015, 4:40 PM Hendricks Limes, PT [phone]

## 2015-02-06 NOTE — Progress Notes (Signed)
Nutrition Follow-up    INTERVENTION:  Meals and snacks: Cater to pt preferences Medical Nutrition Supplement: continue ensure q daily   NUTRITION DIAGNOSIS:   Inadequate oral intake related to acute illness as evidenced by per patient/family report, improved    GOAL:   Patient will meet greater than or equal to 90% of their needs  Meeting nutritional goals  MONITOR:    (Energy Intake, Electrolyte and renal Profile, anthropometrics, Digestive System)  REASON FOR ASSESSMENT:   Diagnosis    ASSESSMENT:     Pt with last HD on 10/25, continuing to monitor need for HD daily   Current Nutrition: eating 100% of meals. Pt reports good appetite at this time and tolerating ensure enlive   Gastrointestinal Profile: Last BM: 10/27   Scheduled Medications:  . arformoterol  15 mcg Nebulization BID  . epoetin (EPOGEN/PROCRIT) injection  20,000 Units Subcutaneous Weekly  . feeding supplement (ENSURE ENLIVE)  237 mL Oral Q24H  . pantoprazole  40 mg Oral Daily    Continuous Medications:  . anticoagulant sodium citrate       Electrolyte/Renal Profile and Glucose Profile:   Recent Labs Lab 02/04/15 1048 02/05/15 0347 02/06/15 0940  NA 137 140 140  K 3.6 3.4* 4.1  CL 95* 99* 98*  CO2 30 33* 29  BUN 60* 25* 35*  CREATININE 5.73* 3.39* 4.41*  CALCIUM 8.2* 8.2* 8.8*  PHOS 5.1*  --   --   GLUCOSE 182* 87 186*   Protein Profile:  Recent Labs Lab 02/04/15 1048  ALBUMIN 2.6*     Weight Trend since Admission: Filed Weights   02/04/15 1509 02/05/15 0521 02/06/15 0707  Weight: 153 lb 10.6 oz (69.7 kg) 162 lb 14.4 oz (73.891 kg) 163 lb 9.3 oz (74.2 kg)      Diet Order:  Diet regular Room service appropriate?: Yes; Fluid consistency:: Thin  Skin:   reviewed  Height:   Ht Readings from Last 1 Encounters:  01/22/15 5\' 4"  (1.626 m)    Weight:   Wt Readings from Last 1 Encounters:  02/06/15 163 lb 9.3 oz (74.2 kg)     BMI:  Body mass index is 28.06  kg/(m^2).  Estimated Nutritional Needs:   Kcal:  BEE: 1165kcals, TEE: (IF 1.1-1.3)(AF 1.2) 1538-1817kcals  Protein:  79-93g protein (1.1-1.3g/kg)  Fluid:  UOP+1L  EDUCATION NEEDS:   Education needs no appropriate at this time  LOW Care Level  Brandi Cline B. Freida Busman, RD, LDN [phone] (pager)

## 2015-02-06 NOTE — Progress Notes (Signed)
Subjective:  Pt resting in bed comfortably. Cr up to 4.41 at the moment.   Cr does seem to rise off of dialysis.  Objective:  Vital signs in last 24 hours:  Temp:  [98 F (36.7 C)-98.8 F (37.1 C)] 98.5 F (36.9 C) (10/27 0707) Pulse Rate:  [74-81] 78 (10/27 0707) Resp:  [16-28] 17 (10/27 0707) BP: (122-133)/(37-48) 126/48 mmHg (10/27 0707) SpO2:  [95 %-97 %] 96 % (10/27 0707) Weight:  [74.2 kg (163 lb 9.3 oz)] 74.2 kg (163 lb 9.3 oz) (10/27 0707)  Weight change:  Filed Weights   02/04/15 1509 02/05/15 0521 02/06/15 0707  Weight: 69.7 kg (153 lb 10.6 oz) 73.891 kg (162 lb 14.4 oz) 74.2 kg (163 lb 9.3 oz)    Intake/Output: I/O last 3 completed shifts: In: 540 [P.O.:240; I.V.:300] Out: 0    Intake/Output this shift:  Total I/O In: 240 [P.O.:240] Out: 20 [Urine:20]  Physical Exam: General: NAD  Head: Normocephalic, atraumatic. Moist oral mucosal membranes  Eyes: Anicteric  Neck: Supple, trachea midline  Lungs:  Clear to auscultation normal effort  Heart: Regular rate and rhythm no rubs  Abdomen:  Soft, nontender, BS present, distended   Extremities:  1+ peripheral edema.  Neurologic: Nonfocal, moving all four extremities  Skin: No lesions  Access: Right IJ dialysis catheter.    Basic Metabolic Panel:  Recent Labs Lab 02/01/15 1300 02/02/15 0732 02/04/15 1048 02/05/15 0347 02/06/15 0940  NA 138 137 137 140 140  K 4.0 4.0 3.6 3.4* 4.1  CL 96* 96* 95* 99* 98*  CO2 31 32 30 33* 29  GLUCOSE 117* 94 182* 87 186*  BUN 48* 50* 60* 25* 35*  CREATININE 5.18* 5.42* 5.73* 3.39* 4.41*  CALCIUM 8.9 8.4* 8.2* 8.2* 8.8*  PHOS  --   --  5.1*  --   --     Liver Function Tests:  Recent Labs Lab 02/04/15 1048  ALBUMIN 2.6*   No results for input(s): LIPASE, AMYLASE in the last 168 hours. No results for input(s): AMMONIA in the last 168 hours.  CBC:  Recent Labs Lab 02/01/15 1300 02/02/15 0732 02/03/15 0709 02/04/15 0921 02/05/15 0347  WBC 4.9 3.1* 3.2*  4.7 3.2*  NEUTROABS  --  1.7 2.0  --   --   HGB 8.9* 7.6* 7.7* 8.7* 7.7*  HCT 27.8* 23.7* 24.2* 27.3* 24.2*  MCV 92.2 91.5 91.0 91.3 90.9  PLT 116* 93* 98* 132* 94*    Cardiac Enzymes: No results for input(s): CKTOTAL, CKMB, CKMBINDEX, TROPONINI in the last 168 hours.  BNP: Invalid input(s): POCBNP  CBG: No results for input(s): GLUCAP in the last 168 hours.  Microbiology: Results for orders placed or performed during the hospital encounter of 01/22/15  C difficile quick scan w PCR reflex     Status: None   Collection Time: 01/23/15  4:46 PM  Result Value Ref Range Status   C Diff antigen NEGATIVE NEGATIVE Final   C Diff toxin NEGATIVE NEGATIVE Final   C Diff interpretation Negative for C. difficile  Final  Culture, bal-quantitative     Status: None (Preliminary result)   Collection Time: 02/05/15  2:12 PM  Result Value Ref Range Status   Specimen Description BRONCHIAL ALVEOLAR LAVAGE  Final   Special Requests Normal  Final   Gram Stain PENDING  Incomplete   Culture NO GROWTH < 24 HOURS  Final   Report Status PENDING  Incomplete    Coagulation Studies:  Recent Labs  02/05/15 0347  LABPROT 19.2*  INR 1.60    Urinalysis: No results for input(s): COLORURINE, LABSPEC, PHURINE, GLUCOSEU, HGBUR, BILIRUBINUR, KETONESUR, PROTEINUR, UROBILINOGEN, NITRITE, LEUKOCYTESUR in the last 72 hours.  Invalid input(s): APPERANCEUR    Imaging: No results found.   Medications:   . anticoagulant sodium citrate     . arformoterol  15 mcg Nebulization BID  . epoetin (EPOGEN/PROCRIT) injection  20,000 Units Subcutaneous Weekly  . feeding supplement (ENSURE ENLIVE)  237 mL Oral Q24H  . pantoprazole  40 mg Oral Daily   acetaminophen **OR** acetaminophen, albuterol, ALPRAZolam, alteplase, anticoagulant sodium citrate, benzonatate, dexamethasone, fentaNYL (SUBLIMAZE) injection, guaiFENesin-dextromethorphan, lidocaine (PF), lidocaine-prilocaine, loperamide, nitroGLYCERIN, ondansetron  **OR** ondansetron (ZOFRAN) IV, pentafluoroprop-tetrafluoroeth, polyethylene glycol  Assessment/ Plan:  81 y.o. female with past medical history of coronary disease- with coronary stents, Hypertension, Crohn's disease, COPD, uses home oxygen around the clock, Kidney stones- lithotripsy, ureteral stents, History of shingles, Arthritis, Hyperlipidemia, Partial resection of bowel for Crohn's disease, liver cirrhosis.   1.  Acute renal failure secondary to severe ATN/CKD stage III.  Baseline creatinine 1.28 in July 2016 The patient has had progressively worsening renal function since July of 2016.  EGFR was down to 6 as outpt most recently.Preliminary renal biopsy report show severe ATN and oxalate crystals. Possibly from overdiuresis in the setting of cirrhosis. Oxalate crystals are likely related to malabsorption from Crohn's disease - Cr up to 4.4 off of dialysis, disease behaving like ESRD.  Will not declare ESRD at this time given biopsy findings.  Will evaluate for dialysis daily.  2.  Anemia of CKD/thrombocytopenia:  EGD and colonoscopy unrevealing. -continue epogen for now.  3.  SHPTH of renal origin: phos 5.1 at last check and acceptable.  4. Lower extremity Edema - overall improved.    LOS: 15 Brandi Cline 10/27/201611:14 AM

## 2015-02-06 NOTE — Care Management (Addendum)
Patient would benefit from PT evaluation after Bronch yesterday. She would like to return home at discharge and would like to use [facility]. She is checking with her insurance company on outpatient dialysis with acute renal failure. She states she has not received dialysis in two days and "may only require dialysis 1 or 2 times a week". She is on Chronic O2 through [employer] and they assist her with her pulmonary Rx. She states she is breathing much better since her bronch yesterday. She states he has a nebulizer at home. She states she lives alone but her sister shares a garage with her and lives next door. She also mentioned a daughter. Updated Delice Bison CSW. Referral called to [facility]. Patient states she knows [facility] will not take her "due to dialysis; but I don't believe I need rehab". She did request [place] [facility] but flexible if she has to go to SNF. RNCM will continue to follow.  Received call from patient's daughter Dois Davenport [phone] requesting information about Medicaid. I advised her to contact DSS.

## 2015-02-07 LAB — BASIC METABOLIC PANEL
Anion gap: 10 (ref 5–15)
BUN: 38 mg/dL — AB (ref 6–20)
CALCIUM: 8.2 mg/dL — AB (ref 8.9–10.3)
CO2: 29 mmol/L (ref 22–32)
CREATININE: 4.53 mg/dL — AB (ref 0.44–1.00)
Chloride: 100 mmol/L — ABNORMAL LOW (ref 101–111)
GFR calc Af Amer: 10 mL/min — ABNORMAL LOW (ref >60)
GFR, EST NON AFRICAN AMERICAN: 8 mL/min — AB (ref >60)
Glucose, Bld: 101 mg/dL — ABNORMAL HIGH (ref 65–99)
Potassium: 3.2 mmol/L — ABNORMAL LOW (ref 3.5–5.1)
SODIUM: 139 mmol/L (ref 135–145)

## 2015-02-07 LAB — CULTURE, BAL-QUANTITATIVE W GRAM STAIN: Culture: NO GROWTH

## 2015-02-07 LAB — CBC
HCT: 22.4 % — ABNORMAL LOW (ref 35.0–47.0)
Hemoglobin: 7.2 g/dL — ABNORMAL LOW (ref 12.0–16.0)
MCH: 29.1 pg (ref 26.0–34.0)
MCHC: 32.1 g/dL (ref 32.0–36.0)
MCV: 90.6 fL (ref 80.0–100.0)
PLATELETS: 103 10*3/uL — AB (ref 150–440)
RBC: 2.48 MIL/uL — AB (ref 3.80–5.20)
RDW: 14.7 % — AB (ref 11.5–14.5)
WBC: 3.6 10*3/uL (ref 3.6–11.0)

## 2015-02-07 LAB — CULTURE, BAL-QUANTITATIVE: SPECIAL REQUESTS: NORMAL

## 2015-02-07 MED ORDER — TIOTROPIUM BROMIDE MONOHYDRATE 18 MCG IN CAPS
18.0000 ug | ORAL_CAPSULE | Freq: Every day | RESPIRATORY_TRACT | Status: DC
  Administered 2015-02-07 – 2015-02-11 (×5): 18 ug via RESPIRATORY_TRACT
  Filled 2015-02-07 (×2): qty 5

## 2015-02-07 NOTE — Progress Notes (Signed)
[facility] [place] at [facility]   PATIENT NAME: Brandi Cline    MR#:  [id number]  DATE OF BIRTH:  Mar 18, 1931  SUBJECTIVE:  CHIEF COMPLAINT:  No chief complaint on file.  - hasn't had dialysis since 02/04/15- creatinine worsening - feels extremely weak today, ambulated slightly with physical therapy - remains on 4 L oxygen, hb low too -Has right chest dialysis catheter.  REVIEW OF SYSTEMS:  Review of Systems  Constitutional: Negative for fever and chills.  HENT: Negative for ear discharge, ear pain and tinnitus.   Eyes: Negative for blurred vision and double vision.  Respiratory: Positive for shortness of breath. Negative for cough and wheezing.   Cardiovascular: Positive for orthopnea and leg swelling. Negative for chest pain and palpitations.  Gastrointestinal: Negative for heartburn, nausea, vomiting, abdominal pain, diarrhea and constipation.  Genitourinary: Negative for dysuria and urgency.  Musculoskeletal: Negative for myalgias.  Neurological: Positive for weakness. Negative for dizziness, tingling, tremors, seizures and headaches.  Psychiatric/Behavioral: Negative for depression.    DRUG ALLERGIES:   Allergies  Allergen Reactions  . Avelox [Moxifloxacin Hcl In Nacl] Shortness Of Breath, Swelling and Rash  . Codeine Shortness Of Breath and Other (See Comments)    Couldn't breath  . Theophyllines     Slight increased heart rate  . Ciprofloxacin Other (See Comments)    nervous  . Milk-Related Compounds Other (See Comments)    Whole milk hurts stomach. Not all dairy products do this.  . Tape     Please use paper tape only  . Lisinopril Rash    VITALS:  Blood pressure 115/37, pulse 86, temperature 97.9 F (36.6 C), temperature source Oral, resp. rate 20, height 5\' 4"  (1.626 m), weight 75.615 kg (166 lb 11.2 oz), SpO2 89 %.  PHYSICAL EXAMINATION:  Physical Exam  GENERAL:  81 y.o.-year-old patient lying in the bed with no acute distress.   Patient just walked from bathroom and a little short of breath EYES: Pupils equal, round, reactive to light and accommodation. No scleral icterus. Extraocular muscles intact.  HEENT: Head atraumatic, normocephalic. Oropharynx and nasopharynx clear.  NECK:  Supple, no jugular venous distention. No thyroid enlargement, no tenderness.  LUNGS: Normal breath sounds bilaterally, no wheezing, or rhonchi. Some crackles at bases. No use of accessory muscles of respiration.  CARDIOVASCULAR: S1, S2 normal. No rubs, or gallops. 3/6 systolic murmur heard ABDOMEN: Soft, nontender, nondistended. Bowel sounds present. No organomegaly or mass.  EXTREMITIES: still has 2+ pedal edema on the foot. No cyanosis, or clubbing.  NEUROLOGIC: Cranial nerves II through XII are intact. Muscle strength 5/5 in all extremities. Sensation intact. Gait not checked.  PSYCHIATRIC: The patient is alert and oriented x 3.  SKIN: No obvious rash, lesion, or ulcer.    LABORATORY PANEL:   CBC  Recent Labs Lab 02/07/15 0338  WBC 3.6  HGB 7.2*  HCT 22.4*  PLT 103*   ------------------------------------------------------------------------------------------------------------------  Chemistries   Recent Labs Lab 02/07/15 0338  NA 139  K 3.2*  CL 100*  CO2 29  GLUCOSE 101*  BUN 38*  CREATININE 4.53*  CALCIUM 8.2*   ------------------------------------------------------------------------------------------------------------------  Cardiac Enzymes No results for input(s): TROPONINI in the last 168 hours. ------------------------------------------------------------------------------------------------------------------  RADIOLOGY:  No results found.  EKG:   Orders placed or performed during the hospital encounter of 01/22/15  . EKG 12-Lead  . EKG 12-Lead  . EKG 12-Lead  . EKG 12-Lead  . EKG 12-Lead  . EKG 12-Lead  ASSESSMENT AND PLAN:   # Acute renal failure on CKD3  -Renal biopsy - ATN and oxalate  crystals - Likely ATN from overdiuresis with underlying cirrhosis - s/p dialysis catheter placed 10/21 and had 3-4 sessions of HD - last HD on 10/25. Now creatinine worsening off dialysis, will need dialysis again- tomorrow - might be progressing to ESRD, but for now considered to ARF - monitor I&O, no lasix for now per nephrology - outpatient 1-2 times/week dialysis need  # Left lower extremity cellulitis and edema- Improving - not on ABX now - elevate the legs   # Chronic respiratory failure on 3 L nasal cannula due to COPD due to smoking - findings of emphysema on CT chest. -Follows with pulmonologist as outpatient. -Also possibility of alpha-1 antitrypsin deficiency being considered as outpatient. -Currently on 4 L oxygen. Bronchoscopy done with no significant findings. Lavage cultures are pending - CXR with pulm edema   # Acute on chronic anemia: Anemia of chronic disease. -colonoscopy & EGD performed on 10/24 wnl, -Transfuse with dialysis tomorrow -Patient on Epogen with dialysis. Was also following at the cancer center in the past for darbepoetin  # Anxiety: continue xanax as she takes this at home.  # Thrombocytopenia/ coagulopathy likely due to liver disease And crohns disease Appreciate oncology recs Discontinued Lovenox and heparin flushes and heparin given during dialysis.  Improved plts now.  Physical Therapy consult requested, patient needs SNF  All the records are reviewed and case discussed with Care Management/Social Workerr. Management plans discussed with the patient, family and they are in agreement.  CODE STATUS: DNR  TOTAL TIME TAKING CARE OF THIS PATIENT: 38 minutes.   POSSIBLE D/C IN 2-3 DAYS, DEPENDING ON CLINICAL CONDITION.   Janayla Marik M.D on 02/07/2015 at 11:51 AM  Between 7am to 6pm - Pager - [phone]  After 6pm go to [email or web address] - password [email or web address] [facility]  [facility]  Office  [phone]  CC: Primary  care physician; [name], MD

## 2015-02-07 NOTE — Care Management Important Message (Signed)
Important Message  Patient Details  Name: Brandi Cline MRN: [id number] Date of Birth: January 07, 1931   Medicare Important Message Given:  Yes-fourth notification given    Olegario Messier A Nikolay Demetriou 02/07/2015, 10:35 AM

## 2015-02-07 NOTE — Care Management (Signed)
Met again with patient while she was talking to her daughter over the phone. Patient apparently walked out to the hall today with PT. Patient still wants to go home. Her daughter agrees with staying in [facility] for SNF (if needed) and with [facility]. Apparently patient's insurance will cover acute renal dx but unclear amount of coverage- patient and daughter aware. Dr. Holley Raring is unsure of dialysis need- anticipates 1- 2 times per week. Per Dr. Holley Raring patient will have dialysis tomorrow and may then discharge to either home or SNF. I have left message with Iran Sizer the dialysis coordinator to update. Patient and daughter agree with [facility] or SNF in [facility].

## 2015-02-07 NOTE — Care Management Note (Signed)
I have confirmed placement at [facility].  [employer] benefits do cover at this clinic for AKI treatments.  Last week when I met with the patient and her sisters she requested placement in [place] because she stated she wanted to stay with her nephrologist she currently has at [facility] and that she had been seeing prior to this admission.   If patient does go home or to SNF in [place] I will have to do a referral to clinics that cover the [place] area.  Iran Sizer  Dialysis Liaison  [phone]

## 2015-02-07 NOTE — Consult Note (Addendum)
PULMONARY / CRITICAL CARE MEDICINE   Name: Brandi Cline MRN: [id number] DOB: 1930/08/21    ADMISSION DATE:  01/22/2015 CONSULTATION DATE:  02/02/15  REFERRING MD :  Dr. Elpidio Anis   CHIEF COMPLAINT:     Cough, hemoptysis, abnormal CT chest finding   HISTORY OF PRESENT ILLNESS    Patient is a 81 year old female past medical history of acute on chronic renal failure, possible cirrhosis, Crohn's disease, seen in consultation for abdomen the on chest CT. Patient was admitted on 01/22/2015 for shortness of breath, lower extremity edema, left leg pain. She has a history of stage III chronic kidney disease with a recent hospitalization and also a history of COPD with chronic respiratory failure on 3-4 L of nasal cannula. She initially was seen at nephrology office for concern to volume overload and left lower extremity cellulitis, she had not had any urination in 2-3 days prior to that she was urgently admitted to the hospital for acute on chronic renal failure. Since then she has developed a cough, thrombocytopenia, given her history of chronic anemia and history of Crohn's disease, GI was consult it and she is scheduled to have a colonoscopy on Monday, 02/03/2015. She also tested positive for blood in stool. Off note, patient also has a history of recurrent UTI, she was placed on "antibiotic" in July for her UTI that time, since then her creatinine has slowly been trending down, and she's been followed by nephrology, Dr. Thedore Mins. Patient states she is a former smoker, quit in 1992, smoked about 1-1.5 packs per day for 25 years. She endorses blood-tinged sputum, productive cough.    SIGNIFICANT EVENTS   10/26>>bronch>>BAL neg, no endobronchial lesions  PAST MEDICAL HISTORY    :  Past Medical History  Diagnosis Date  . Dyspnea   . Esophageal reflux   . CAD (coronary artery disease)   . Hypertension   . Crohn's disease (HCC)   . Allergic rhinitis   . COPD (chronic obstructive pulmonary  disease) (HCC)   . History of kidney stones   . History of shingles   . Trigeminal neuralgia   . Family history of anesthesia complication     " SISTER HAD BAD FEELINGS "  . Anginal pain (HCC)   . Anxiety     ' JUST FOR MY BREATHING "  . Arthritis     mild in hands  . Hyperlipidemia   . CKD (chronic kidney disease)    Past Surgical History  Procedure Laterality Date  . Coronary stents    . Lithotripsy    . Ureteral stent placement    . Cholecystectomy    . Total abdominal hysterectomy    . Partial resections large and small bowel for crohn's    . Breast lumps benign    . Left heart catheterization with coronary angiogram N/A 10/28/2011    Procedure: LEFT HEART CATHETERIZATION WITH CORONARY ANGIOGRAM;  Surgeon: Lesleigh Noe, MD;  Location: [facility] CATH LAB;  Service: Cardiovascular;  Laterality: N/A;  . Peripheral vascular catheterization N/A 01/31/2015    Procedure: Dialysis/Perma Catheter Insertion;  Surgeon: Renford Dills, MD;  Location: [facility] INVASIVE CV LAB;  Service: Cardiovascular;  Laterality: N/A;  . Endobronchial ultrasound N/A 02/05/2015    Procedure: ENDOBRONCHIAL ULTRASOUND;  Surgeon: Stephanie Acre, MD;  Location: [facility] ORS;  Service: Cardiopulmonary;  Laterality: N/A;  . Esophagogastroduodenoscopy (egd) with propofol N/A 02/03/2015    Procedure: ESOPHAGOGASTRODUODENOSCOPY (EGD) WITH PROPOFOL;  Surgeon: Midge Minium, MD;  Location: [facility] ENDOSCOPY;  Service: Endoscopy;  Laterality: N/A;  Look into the colon and stomach with a light to look for the source of bleeding.  . Colonoscopy with propofol N/A 02/03/2015    Procedure: COLONOSCOPY WITH PROPOFOL;  Surgeon: Midge Minium, MD;  Location: [facility] ENDOSCOPY;  Service: Endoscopy;  Laterality: N/A;   Prior to Admission medications   Medication Sig Start Date End Date Taking? Authorizing Provider  albuterol (PROVENTIL HFA;VENTOLIN HFA) 108 (90 BASE) MCG/ACT inhaler Inhale 2 puffs into the lungs every 6 (six) hours as needed for  wheezing or shortness of breath. 10/08/14  Yes Waymon Budge, MD  albuterol (PROVENTIL) (2.5 MG/3ML) 0.083% nebulizer solution Take 3 mLs (2.5 mg total) by nebulization every 4 (four) hours as needed. Shortness of breath 10/03/14  Yes Waymon Budge, MD  ALPRAZolam Prudy Feeler) 0.5 MG tablet TAKE ONE TABLET BY MOUTH THREE TIMES DAILY AS NEEDED FOR ANXIETY 01/16/15  Yes Dianne Dun, MD  arformoterol (BROVANA) 15 MCG/2ML NEBU Take 2 mLs (15 mcg total) by nebulization 2 (two) times daily. 08/14/14  Yes Waymon Budge, MD  BIOTIN 5000 PO Take 2 tablets by mouth daily.   Yes Historical Provider, MD  Cholecalciferol (VITAMIN D-3) 5000 UNITS TABS Take 1 tablet by mouth daily.   Yes Historical Provider, MD  isosorbide mononitrate (IMDUR) 30 MG 24 hr tablet TAKE TWO TABLETS BY MOUTH DAILY 08/13/14  Yes Lyn Records, MD  loratadine (CLARITIN) 10 MG tablet Take 10 mg by mouth daily as needed.  07/11/12  Yes Marden Noble, MD  magnesium oxide (MAG-OX) 400 MG tablet Take 400 mg by mouth daily.    Yes Historical Provider, MD  Multiple Vitamin (MULTIVITAMIN) tablet Take 1 tablet by mouth daily.    Yes Historical Provider, MD  nitroGLYCERIN (NITROSTAT) 0.4 MG SL tablet Place 1 tablet (0.4 mg total) under the tongue every 5 (five) minutes as needed. Chest pain 04/27/13  Yes Lyn Records, MD  omeprazole (PRILOSEC) 20 MG capsule TAKE ONE CAPSULE BY MOUTH DAILY 12/03/14  Yes Dianne Dun, MD  Zinc Acetate, Oral, (ZINC ACETATE PO) Take 1 tablet by mouth daily.   Yes Historical Provider, MD   Allergies  Allergen Reactions  . Avelox [Moxifloxacin Hcl In Nacl] Shortness Of Breath, Swelling and Rash  . Codeine Shortness Of Breath and Other (See Comments)    Couldn't breath  . Theophyllines     Slight increased heart rate  . Ciprofloxacin Other (See Comments)    nervous  . Milk-Related Compounds Other (See Comments)    Whole milk hurts stomach. Not all dairy products do this.  . Tape     Please use paper tape only  .  Lisinopril Rash     FAMILY HISTORY   Family History  Problem Relation Age of Onset  . Breast cancer Sister   . COPD Sister   . Stroke Mother   . Prostate cancer Father   . Prostate cancer Brother   . Heart attack Brother       SOCIAL HISTORY    reports that she quit smoking about 24 years ago. Her smoking use included Cigarettes. She has a 30 pack-year smoking history. She has never used smokeless tobacco. She reports that she does not drink alcohol or use illicit drugs.  Review of Systems  Constitutional: Negative for fever and chills.  HENT: Negative for hearing loss and tinnitus.   Eyes: Negative for blurred vision and double vision.  Respiratory:       Improving  cough   Gastrointestinal: Negative for heartburn, nausea, vomiting and abdominal pain.  Genitourinary: Negative for dysuria.  Skin: Negative for itching and rash.  Neurological: Negative for headaches.      VITAL SIGNS    Temp:  [97.9 F (36.6 C)-98.6 F (37 C)] 98.6 F (37 C) (10/28 1152) Pulse Rate:  [70-86] 70 (10/28 1152) Resp:  [19-24] 19 (10/28 1152) BP: (101-138)/(35-50) 118/41 mmHg (10/28 1152) SpO2:  [89 %-98 %] 97 % (10/28 1152) Weight:  [166 lb 11.2 oz (75.615 kg)] 166 lb 11.2 oz (75.615 kg) (10/28 0500) HEMODYNAMICS:   VENTILATOR SETTINGS:   INTAKE / OUTPUT:  Intake/Output Summary (Last 24 hours) at 02/07/15 1252 Last data filed at 02/07/15 0900  Gross per 24 hour  Intake    340 ml  Output    235 ml  Net    105 ml       PHYSICAL EXAM   Physical Exam  Constitutional: She is oriented to person, place, and time. She appears well-developed and well-nourished.  HENT:  Head: Normocephalic and atraumatic.  Right Ear: External ear normal.  Left Ear: External ear normal.  Mouth/Throat: Oropharynx is clear and moist.  Eyes: Pupils are equal, round, and reactive to light.  Neck: Normal range of motion.  Cardiovascular: Normal rate, regular rhythm, normal heart sounds and intact  distal pulses.   Pulmonary/Chest: No respiratory distress. She has no wheezes. She exhibits no tenderness.  Mild fine wheezes  Abdominal: Soft. She exhibits no distension. There is no tenderness.  Musculoskeletal: Normal range of motion. She exhibits edema.  Neurological: She is alert and oriented to person, place, and time.  Skin: Skin is warm and dry.  Nursing note and vitals reviewed.      LABS   LABS:  CBC  Recent Labs Lab 02/04/15 0921 02/05/15 0347 02/07/15 0338  WBC 4.7 3.2* 3.6  HGB 8.7* 7.7* 7.2*  HCT 27.3* 24.2* 22.4*  PLT 132* 94* 103*   Coag's  Recent Labs Lab 02/05/15 0347  INR 1.60   BMET  Recent Labs Lab 02/05/15 0347 02/06/15 0940 02/07/15 0338  NA 140 140 139  K 3.4* 4.1 3.2*  CL 99* 98* 100*  CO2 33* 29 29  BUN 25* 35* 38*  CREATININE 3.39* 4.41* 4.53*  GLUCOSE 87 186* 101*   Electrolytes  Recent Labs Lab 02/04/15 1048 02/05/15 0347 02/06/15 0940 02/07/15 0338  CALCIUM 8.2* 8.2* 8.8* 8.2*  PHOS 5.1*  --   --   --    Sepsis Markers No results for input(s): LATICACIDVEN, PROCALCITON, O2SATVEN in the last 168 hours. ABG No results for input(s): PHART, PCO2ART, PO2ART in the last 168 hours. Liver Enzymes  Recent Labs Lab 02/04/15 1048  ALBUMIN 2.6*   Cardiac Enzymes No results for input(s): TROPONINI, PROBNP in the last 168 hours. Glucose No results for input(s): GLUCAP in the last 168 hours.   Recent Results (from the past 240 hour(s))  Culture, bal-quantitative     Status: None   Collection Time: 02/05/15  2:12 PM  Result Value Ref Range Status   Specimen Description BRONCHIAL ALVEOLAR LAVAGE  Final   Special Requests Normal  Final   Gram Stain   Final    MODERATE WBC SEEN NO ORGANISMS SEEN EXCELLENT SPECIMEN - 90-100% WBCS    Culture NO GROWTH 2 DAYS  Final   Report Status 02/07/2015 FINAL  Final     Current facility-administered medications:  .  acetaminophen (TYLENOL) tablet 650 mg, 650 mg, Oral,  Q6H  PRN, 650 mg at 02/07/15 0820 **OR** acetaminophen (TYLENOL) suppository 650 mg, 650 mg, Rectal, Q6H PRN, Gale Journey, MD .  albuterol (PROVENTIL) (2.5 MG/3ML) 0.083% nebulizer solution 2.5 mg, 2.5 mg, Nebulization, Q4H PRN, Gale Journey, MD, 2.5 mg at 02/07/15 0814 .  ALPRAZolam Prudy Feeler) tablet 0.5 mg, 0.5 mg, Oral, TID PRN, Gale Journey, MD, 0.5 mg at 02/06/15 2304 .  alteplase (CATHFLO ACTIVASE) injection 2 mg, 2 mg, Intracatheter, Once PRN, Munsoor Lateef, MD .  anticoagulant sodium citrate solution 5 mL, 5 mL, Dialysis, Continuous PRN, Mosetta Pigeon, MD, 5 mL at 01/27/15 1135 .  arformoterol (BROVANA) nebulizer solution 15 mcg, 15 mcg, Nebulization, BID, Gale Journey, MD, 15 mcg at 02/07/15 [phone] .  benzonatate (TESSALON) capsule 100 mg, 100 mg, Oral, Q6H PRN, Milagros Loll, MD, 100 mg at 02/05/15 2305 .  dexamethasone (DECADRON) injection 8 mg, 8 mg, Intravenous, Once PRN, Amy Rice, MD .  epoetin alfa (EPOGEN,PROCRIT) injection 20,000 Units, 20,000 Units, Subcutaneous, Weekly, Mosetta Pigeon, MD, 20,000 Units at 02/02/15 1259 .  feeding supplement (ENSURE ENLIVE) (ENSURE ENLIVE) liquid 237 mL, 237 mL, Oral, Q24H, Aruna Gouru, MD, 237 mL at 02/06/15 1511 .  fentaNYL (SUBLIMAZE) injection 25-50 mcg, 25-50 mcg, Intravenous, Q5 min PRN, Amy Rice, MD .  guaiFENesin-dextromethorphan (ROBITUSSIN DM) 100-10 MG/5ML syrup 10 mL, 10 mL, Oral, Q6H PRN, Ramonita Lab, MD, 10 mL at 01/30/15 2104 .  lidocaine (PF) (XYLOCAINE) 1 % injection 5 mL, 5 mL, Intradermal, PRN, Munsoor Lateef, MD .  lidocaine-prilocaine (EMLA) cream 1 application, 1 application, Topical, PRN, Munsoor Lateef, MD .  loperamide (IMODIUM) capsule 2 mg, 2 mg, Oral, Q6H PRN, Oralia Manis, MD, 2 mg at 02/06/15 2304 .  nitroGLYCERIN (NITROSTAT) SL tablet 0.4 mg, 0.4 mg, Sublingual, Q5 min PRN, Gale Journey, MD .  ondansetron [facility]) tablet 4 mg, 4 mg, Oral, Q6H PRN, 4 mg at 02/03/15 0148 **OR** ondansetron (ZOFRAN)  injection 4 mg, 4 mg, Intravenous, Q6H PRN, Gale Journey, MD, 4 mg at 02/05/15 1327 .  pantoprazole (PROTONIX) EC tablet 40 mg, 40 mg, Oral, Daily, Gale Journey, MD, 40 mg at 02/07/15 1013 .  pentafluoroprop-tetrafluoroeth (GEBAUERS) aerosol 1 application, 1 application, Topical, PRN, Munsoor Lateef, MD .  polyethylene glycol (MIRALAX / GLYCOLAX) packet 17 g, 17 g, Oral, Daily PRN, Gale Journey, MD .  tiotropium [facility]) inhalation capsule 18 mcg, 18 mcg, Inhalation, Daily, Enid Baas, MD  IMAGING    No results found.    Indwelling Urinary Catheter continued, requirement due to   Reason to continue Indwelling Urinary Catheter for strict Intake/Output monitoring for hemodynamic instability   Central Line continued, requirement due to   Reason to continue [employer] Monitoring of central venous pressure or other hemodynamic parameters   Ventilator continued, requirement due to, resp failure    Ventilator Sedation RASS 0 to -2   Lines:  Cultures: Urine Cx 10/1>>no growth Urine Cx 9/28>>E. Coli BAL 10/26>> no growth x 2 days - final   Antibiotics: Vanc  10/15>10/17  ASSESSMENT/PLAN  81 year old female past medical history of CAD, status post stents, hypertension, Crohn's disease, COPD on chronic O2, urethral stents, arthritis hyperlipidemia, Crohn's status post partial resection, liver cirrhosis seen in consultation for CT chest abnormality of the right mainstem.  CT chest abnormality - s\p bronch on 10/26 (see report in separate note) -most likely mucus plug on CT - no lesions or hemoptysis seen on bronchoscopy -BAL of RML done - negative, final,  no growth - patient stable from a pulmonary standpoint, back to baseline status.   Bilateral pleural effusion -Volume overload  -Continue with diuresis and monitoring strict I's and O's -Patient does have a PermCath placed, however no urgent indication for dialysis at this time especially with improving  kidney function.  COPD-emphysematous type -History of smoking, continue with supplemental oxygen to maintain saturations greater than 88% -Continue with DuoNeb's while in the inpatient setting, may start back home inhalers closer to discharge  Patient can follows with Dr. Fannie Knee at [facility], please call [phone], to make and appointment if a pulmonary hospital follow up is needed. Patient has an appointment with Dr. [name] on 04/28/2015 at 1:45pm.   Thank you for consulting [facility], we will signoff at this time.  Please feel free to contacts [place] with any questions.    I have personally obtained a history, examined the patient, evaluated laboratory and imaging results, formulated the assessment and plan and placed orders.  Pulmonary consult time-35 minutes  Stephanie Acre, MD [facility] Pager [phone] (please enter 7-digits) On Call Pager - [phone] (please enter 7-digits)   02/07/2015, 12:52 PM

## 2015-02-07 NOTE — Clinical Social Work Note (Signed)
CSW has had long conversation with patient this afternoon. Patient was sitting up on the side of her bed dressed in her pajamas. Patient is stating that she has decided she rather go home than go to rehab. She states she has oxygen, walker, rollator, elevated bedside commode, and shower chair at home. Patient believes she will do better in her own environment and that she will be able to adjust better in her own environment. Patient did not want to choose an [facility] rehab facility at this time so that Monia Pouch could begin the auth process at this time since she wishes to return home. Nephrology spoke with CSW and stated patient would have a follow up in their office and from there, they would determine when patient would need a dialysis treatment.  York Spaniel MSW,LCSW [phone]

## 2015-02-07 NOTE — Progress Notes (Signed)
Subjective:  Pt seen at bedside. It appears she has insurance coverage for acute renal failure as outpt. Renal failure continues to worsen off of dialysis. Will plan for HD tomorrow.   Objective:  Vital signs in last 24 hours:  Temp:  [97.9 F (36.6 C)-98.6 F (37 C)] 98.6 F (37 C) (10/28 1152) Pulse Rate:  [70-86] 70 (10/28 1152) Resp:  [19-24] 19 (10/28 1152) BP: (101-138)/(35-50) 118/41 mmHg (10/28 1152) SpO2:  [89 %-98 %] 97 % (10/28 1152) Weight:  [75.615 kg (166 lb 11.2 oz)] 75.615 kg (166 lb 11.2 oz) (10/28 0500)  Weight change:  Filed Weights   02/05/15 0521 02/06/15 0707 02/07/15 0500  Weight: 73.891 kg (162 lb 14.4 oz) 74.2 kg (163 lb 9.3 oz) 75.615 kg (166 lb 11.2 oz)    Intake/Output: I/O last 3 completed shifts: In: 600 [P.O.:600] Out: 145 [Urine:145]   Intake/Output this shift:  Total I/O In: 100 [P.O.:100] Out: 160 [Urine:160]  Physical Exam: General: NAD  Head: Normocephalic, atraumatic. Moist oral mucosal membranes  Eyes: Anicteric  Neck: Supple, trachea midline  Lungs:  Clear to auscultation normal effort  Heart: Regular rate and rhythm no rubs  Abdomen:  Soft, nontender, BS present, distended   Extremities:  1+ peripheral edema.  Neurologic: Nonfocal, moving all four extremities  Skin: No lesions  Access: Right IJ dialysis catheter.    Basic Metabolic Panel:  Recent Labs Lab 02/02/15 0732 02/04/15 1048 02/05/15 0347 02/06/15 0940 02/07/15 0338  NA 137 137 140 140 139  K 4.0 3.6 3.4* 4.1 3.2*  CL 96* 95* 99* 98* 100*  CO2 32 30 33* 29 29  GLUCOSE 94 182* 87 186* 101*  BUN 50* 60* 25* 35* 38*  CREATININE 5.42* 5.73* 3.39* 4.41* 4.53*  CALCIUM 8.4* 8.2* 8.2* 8.8* 8.2*  PHOS  --  5.1*  --   --   --     Liver Function Tests:  Recent Labs Lab 02/04/15 1048  ALBUMIN 2.6*   No results for input(s): LIPASE, AMYLASE in the last 168 hours. No results for input(s): AMMONIA in the last 168 hours.  CBC:  Recent Labs Lab  02/02/15 0732 02/03/15 0709 02/04/15 0921 02/05/15 0347 02/07/15 0338  WBC 3.1* 3.2* 4.7 3.2* 3.6  NEUTROABS 1.7 2.0  --   --   --   HGB 7.6* 7.7* 8.7* 7.7* 7.2*  HCT 23.7* 24.2* 27.3* 24.2* 22.4*  MCV 91.5 91.0 91.3 90.9 90.6  PLT 93* 98* 132* 94* 103*    Cardiac Enzymes: No results for input(s): CKTOTAL, CKMB, CKMBINDEX, TROPONINI in the last 168 hours.  BNP: Invalid input(s): POCBNP  CBG: No results for input(s): GLUCAP in the last 168 hours.  Microbiology: Results for orders placed or performed during the hospital encounter of 01/22/15  C difficile quick scan w PCR reflex     Status: None   Collection Time: 01/23/15  4:46 PM  Result Value Ref Range Status   C Diff antigen NEGATIVE NEGATIVE Final   C Diff toxin NEGATIVE NEGATIVE Final   C Diff interpretation Negative for C. difficile  Final  Culture, bal-quantitative     Status: None   Collection Time: 02/05/15  2:12 PM  Result Value Ref Range Status   Specimen Description BRONCHIAL ALVEOLAR LAVAGE  Final   Special Requests Normal  Final   Gram Stain   Final    MODERATE WBC SEEN NO ORGANISMS SEEN EXCELLENT SPECIMEN - 90-100% WBCS    Culture NO GROWTH 2 DAYS  Final   Report Status 02/07/2015 FINAL  Final    Coagulation Studies:  Recent Labs  02/05/15 0347  LABPROT 19.2*  INR 1.60    Urinalysis: No results for input(s): COLORURINE, LABSPEC, PHURINE, GLUCOSEU, HGBUR, BILIRUBINUR, KETONESUR, PROTEINUR, UROBILINOGEN, NITRITE, LEUKOCYTESUR in the last 72 hours.  Invalid input(s): APPERANCEUR    Imaging: No results found.   Medications:   . anticoagulant sodium citrate     . arformoterol  15 mcg Nebulization BID  . epoetin (EPOGEN/PROCRIT) injection  20,000 Units Subcutaneous Weekly  . feeding supplement (ENSURE ENLIVE)  237 mL Oral Q24H  . pantoprazole  40 mg Oral Daily  . tiotropium  18 mcg Inhalation Daily   acetaminophen **OR** acetaminophen, albuterol, ALPRAZolam, alteplase, anticoagulant  sodium citrate, benzonatate, dexamethasone, fentaNYL (SUBLIMAZE) injection, guaiFENesin-dextromethorphan, lidocaine (PF), lidocaine-prilocaine, loperamide, nitroGLYCERIN, ondansetron **OR** ondansetron (ZOFRAN) IV, pentafluoroprop-tetrafluoroeth, polyethylene glycol  Assessment/ Plan:  81 y.o. female with past medical history of coronary disease- with coronary stents, Hypertension, Crohn's disease, COPD, uses home oxygen around the clock, Kidney stones- lithotripsy, ureteral stents, History of shingles, Arthritis, Hyperlipidemia, Partial resection of bowel for Crohn's disease, liver cirrhosis.   1.  Acute renal failure secondary to severe ATN/CKD stage III.  Baseline creatinine 1.28 in July 2016 The patient has had progressively worsening renal function since July of 2016.  EGFR was down to 6 as outpt most recently.Preliminary renal biopsy report show severe ATN and oxalate crystals. Possibly from overdiuresis in the setting of cirrhosis. Oxalate crystals are likely related to malabsorption from Crohn's disease - Cr 4.53 today, Cr continues to rise off of dialysis.  Will plan for HD again tomorrow.  She will likely need dialysis on a PRN basis as outpt.  She may ultimately declare herself as ESRD.  Dr. Candiss Norse to follow closely as outpt.   2.  Anemia of CKD/thrombocytopenia:  EGD and colonoscopy unrevealing. -will likely need epogen as outpt, may need to receive this at cancer center.  3.  SHPTH of renal origin: will monitor bone mineral metabolism as outpt.  4. Lower extremity Edema - overall improved with HD, will continue UF with HD as tolerated.    LOS: [place] 10/28/20163:35 PM

## 2015-02-07 NOTE — Progress Notes (Signed)
Physical Therapy Treatment Patient Details Name: Brandi Cline MRN: [id number] DOB: [birth year] Today's Date: 02/07/2015    History of Present Illness Pt is an 81 y.o. female presenting to hospital 01/22/15 with SOB, LE edema, and L leg pain and redness.  Pt s/p 01/22/15 R femoral temporary dialysis catheter placement (now removed and pt s/p permcath R IJ 01/31/15).  Pt s/p renal biopsy 01/29/15, s/p EGD 02/03/15, and s/p bronch 02/05/15.  Pt with recent hospital stay 9/30-10/5/16 for acute renal failure.  PMH includes CKD stage 3, COPD, chronic respiratory failure on 3-4 L/min via [place], CAD, Crohn's disease, htn.    PT Comments    Pt reporting walking around nurses station and down hallway yesterday with RW and nursing assist but pt appearing weaker today and fatigued with distance ambulated (PT limited distance/activity d/t this).  Pt with decreased cadence compared to prior PT sessions as well (pt reporting walking faster yesterday).  Pt educated on pacing/energy conservation techniques; pt verbalizing good understanding.  D/t pt's current decreased activity tolerance, continue to recommend STR but will continue to monitor pt's progress.   Follow Up Recommendations  SNF (pending progress)     Equipment Recommendations   (pt reports having RW at home)    Recommendations for Other Services       Precautions / Restrictions Precautions Precautions: Fall Restrictions Weight Bearing Restrictions: No    Mobility  Bed Mobility Overal bed mobility: Needs Assistance Bed Mobility: Supine to Sit     Supine to sit: Min assist;HOB elevated (assist for trunk) Sit to supine: Supervision;HOB elevated   General bed mobility comments: increased effort noted to get OOB  Transfers Overall transfer level: Needs assistance Equipment used: Rolling walker (2 wheeled) Transfers: Sit to/from Stand Sit to Stand: Min guard         General transfer comment: steady but appearing weaker than prior  sessions  Ambulation/Gait Ambulation/Gait assistance: Min guard Ambulation Distance (Feet): 130 Feet Assistive device: Rolling walker (2 wheeled)   Gait velocity: decreased   General Gait Details: decreased B step length/foot clearance/heelstrike; limited d/t SOB with distance and fatigue (pt wanting to ambulate further but PT limited distance d/t pt appearing fatigued and a little "shaky" with distance   Stairs            Wheelchair Mobility    Modified Rankin (Stroke Patients Only)       Balance Overall balance assessment: Needs assistance Sitting-balance support: Bilateral upper extremity supported;Feet supported Sitting balance-Leahy Scale: Good     Standing balance support: Bilateral upper extremity supported (on RW) Standing balance-Leahy Scale: Fair                      Cognition Arousal/Alertness: Awake/alert Behavior During Therapy: WFL for tasks assessed/performed Overall Cognitive Status: Within Functional Limits for tasks assessed                      Exercises      General Comments   Nursing cleared pt for participation in physical therapy (discussed pt's low Hg with nursing who reports no plan for transfusion and pt was cleared to work with PT).  Pt agreeable to PT session.       Pertinent Vitals/Pain Pain Assessment: 0-10 Pain Score: 5  Pain Location: R shoulder pain ((pt reports from sleeping on it wrong and hurts when she moves it)) Pain Descriptors / Indicators: Sore Pain Intervention(s): Limited activity within patient's tolerance;Monitored during session;Premedicated before session;Repositioned  Vitals stable and WFL throughout treatment session.    Home Living                      Prior Function            PT Goals (current goals can now be found in the care plan section) Acute Rehab PT Goals Patient Stated Goal: to go home if possible PT Goal Formulation: With patient Time For Goal Achievement:  02/17/15 Potential to Achieve Goals: Fair Progress towards PT goals: Progressing toward goals    Frequency  Min 2X/week    PT Plan Current plan remains appropriate    Co-evaluation             End of Session Equipment Utilized During Treatment: Gait belt;Oxygen Activity Tolerance: Patient limited by fatigue Patient left: in bed;with call bell/phone within reach;with bed alarm set     Time: 0355-9741 PT Time Calculation (min) (ACUTE ONLY): 25 min  Charges:  $Gait Training: 8-22 mins $Therapeutic Activity: 8-22 mins                    G CodesHendricks Limes 11-Feb-2015, 10:29 AM Hendricks Limes, PT [phone]

## 2015-02-08 LAB — BASIC METABOLIC PANEL
ANION GAP: 11 (ref 5–15)
BUN: 44 mg/dL — ABNORMAL HIGH (ref 6–20)
CHLORIDE: 100 mmol/L — AB (ref 101–111)
CO2: 30 mmol/L (ref 22–32)
CREATININE: 5.48 mg/dL — AB (ref 0.44–1.00)
Calcium: 8.3 mg/dL — ABNORMAL LOW (ref 8.9–10.3)
GFR calc non Af Amer: 7 mL/min — ABNORMAL LOW (ref >60)
GFR, EST AFRICAN AMERICAN: 8 mL/min — AB (ref >60)
GLUCOSE: 91 mg/dL (ref 65–99)
Potassium: 3.6 mmol/L (ref 3.5–5.1)
Sodium: 141 mmol/L (ref 135–145)

## 2015-02-08 LAB — CBC
HEMATOCRIT: 25.2 % — AB (ref 35.0–47.0)
HEMOGLOBIN: 8.1 g/dL — AB (ref 12.0–16.0)
MCH: 28.8 pg (ref 26.0–34.0)
MCHC: 32.2 g/dL (ref 32.0–36.0)
MCV: 89.6 fL (ref 80.0–100.0)
Platelets: 119 10*3/uL — ABNORMAL LOW (ref 150–440)
RBC: 2.81 MIL/uL — ABNORMAL LOW (ref 3.80–5.20)
RDW: 14.7 % — AB (ref 11.5–14.5)
WBC: 3.6 10*3/uL (ref 3.6–11.0)

## 2015-02-08 MED ORDER — EPOETIN ALFA 10000 UNIT/ML IJ SOLN
10000.0000 [IU] | Freq: Once | INTRAMUSCULAR | Status: AC
  Administered 2015-02-08: 10000 [IU] via INTRAVENOUS

## 2015-02-08 MED ORDER — HEPARIN SODIUM (PORCINE) 1000 UNIT/ML IJ SOLN
1000.0000 [IU] | Freq: Once | INTRAMUSCULAR | Status: AC
  Administered 2015-02-08: 3000 [IU]

## 2015-02-08 NOTE — Plan of Care (Signed)
Problem: Phase I Progression Outcomes Goal: OOB as tolerated unless otherwise ordered Outcome: Progressing Patient ambulating in room.  Continues to have dyspnea with exertion.  Continue to monitor.

## 2015-02-08 NOTE — Progress Notes (Signed)
Subjective:   Resting comfortably. Hemodialysis for later today.   Objective:  Vital signs in last 24 hours:  Temp:  [98 F (36.7 C)-98.4 F (36.9 C)] 98.4 F (36.9 C) (10/29 1252) Pulse Rate:  [74-81] 81 (10/29 1252) Resp:  [17-24] 17 (10/29 1252) BP: (116-136)/(30-40) 128/30 mmHg (10/29 1252) SpO2:  [96 %-98 %] 96 % (10/29 1252) Weight:  [69.355 kg (152 lb 14.4 oz)] 69.355 kg (152 lb 14.4 oz) (10/29 0526)  Weight change: -4.845 kg (-10 lb 10.9 oz) Filed Weights   02/06/15 0707 02/07/15 0500 02/08/15 0526  Weight: 74.2 kg (163 lb 9.3 oz) 75.615 kg (166 lb 11.2 oz) 69.355 kg (152 lb 14.4 oz)    Intake/Output: I/O last 3 completed shifts: In: 360 [P.O.:360] Out: 410 [Urine:410]   Intake/Output this shift:  Total I/O In: 540 [P.O.:540] Out: 150 [Urine:150]  Physical Exam: General: NAD  Head: Normocephalic, atraumatic. Moist oral mucosal membranes  Eyes: Anicteric  Neck: Supple, trachea midline  Lungs:  Clear to auscultation normal effort  Heart: Regular rate and rhythm no rubs  Abdomen:  Soft, nontender, BS present, distended   Extremities:  1+ peripheral edema.  Neurologic: Nonfocal, moving all four extremities  Skin: No lesions  Access: Right IJ dialysis catheter.    Basic Metabolic Panel:  Recent Labs Lab 02/04/15 1048 02/05/15 0347 02/06/15 0940 02/07/15 0338 02/08/15 0753  NA 137 140 140 139 141  K 3.6 3.4* 4.1 3.2* 3.6  CL 95* 99* 98* 100* 100*  CO2 30 33* 29 29 30   GLUCOSE 182* 87 186* 101* 91  BUN 60* 25* 35* 38* 44*  CREATININE 5.73* 3.39* 4.41* 4.53* 5.48*  CALCIUM 8.2* 8.2* 8.8* 8.2* 8.3*  PHOS 5.1*  --   --   --   --     Liver Function Tests:  Recent Labs Lab 02/04/15 1048  ALBUMIN 2.6*   No results for input(s): LIPASE, AMYLASE in the last 168 hours. No results for input(s): AMMONIA in the last 168 hours.  CBC:  Recent Labs Lab 02/02/15 0732 02/03/15 0709 02/04/15 0921 02/05/15 0347 02/07/15 0338 02/08/15 0753  WBC  3.1* 3.2* 4.7 3.2* 3.6 3.6  NEUTROABS 1.7 2.0  --   --   --   --   HGB 7.6* 7.7* 8.7* 7.7* 7.2* 8.1*  HCT 23.7* 24.2* 27.3* 24.2* 22.4* 25.2*  MCV 91.5 91.0 91.3 90.9 90.6 89.6  PLT 93* 98* 132* 94* 103* 119*    Cardiac Enzymes: No results for input(s): CKTOTAL, CKMB, CKMBINDEX, TROPONINI in the last 168 hours.  BNP: Invalid input(s): POCBNP  CBG: No results for input(s): GLUCAP in the last 168 hours.  Microbiology: Results for orders placed or performed during the hospital encounter of 01/22/15  C difficile quick scan w PCR reflex     Status: None   Collection Time: 01/23/15  4:46 PM  Result Value Ref Range Status   C Diff antigen NEGATIVE NEGATIVE Final   C Diff toxin NEGATIVE NEGATIVE Final   C Diff interpretation Negative for C. difficile  Final  Culture, bal-quantitative     Status: None   Collection Time: 02/05/15  2:12 PM  Result Value Ref Range Status   Specimen Description BRONCHIAL ALVEOLAR LAVAGE  Final   Special Requests Normal  Final   Gram Stain   Final    MODERATE WBC SEEN NO ORGANISMS SEEN EXCELLENT SPECIMEN - 90-100% WBCS    Culture NO GROWTH 2 DAYS  Final   Report Status 02/07/2015 FINAL  Final    Coagulation Studies: No results for input(s): LABPROT, INR in the last 72 hours.  Urinalysis: No results for input(s): COLORURINE, LABSPEC, PHURINE, GLUCOSEU, HGBUR, BILIRUBINUR, KETONESUR, PROTEINUR, UROBILINOGEN, NITRITE, LEUKOCYTESUR in the last 72 hours.  Invalid input(s): APPERANCEUR    Imaging: No results found.   Medications:   . anticoagulant sodium citrate     . arformoterol  15 mcg Nebulization BID  . epoetin (EPOGEN/PROCRIT) injection  10,000 Units Intravenous Once  . epoetin (EPOGEN/PROCRIT) injection  20,000 Units Subcutaneous Weekly  . feeding supplement (ENSURE ENLIVE)  237 mL Oral Q24H  . pantoprazole  40 mg Oral Daily  . tiotropium  18 mcg Inhalation Daily   acetaminophen **OR** acetaminophen, albuterol, ALPRAZolam, alteplase,  anticoagulant sodium citrate, benzonatate, dexamethasone, fentaNYL (SUBLIMAZE) injection, guaiFENesin-dextromethorphan, lidocaine (PF), lidocaine-prilocaine, loperamide, nitroGLYCERIN, ondansetron **OR** ondansetron (ZOFRAN) IV, pentafluoroprop-tetrafluoroeth, polyethylene glycol  Assessment/ Plan:  81 y.o. female with past medical history of coronary disease- with coronary stents, Hypertension, Crohn's disease, COPD, uses home oxygen around the clock, Kidney stones- lithotripsy, ureteral stents, History of shingles, Arthritis, Hyperlipidemia, Partial resection of bowel for Crohn's disease, liver cirrhosis.   1.  Acute renal failure secondary to severe ATN/CKD stage III.  Baseline creatinine 1.28 in July 2016 The patient has had progressively worsening renal function since July of 2016.  EGFR was down to 6 as outpt most recently.Preliminary renal biopsy report show severe ATN and oxalate crystals. Possibly from overdiuresis in the setting of cirrhosis. Oxalate crystals are likely related to malabsorption from Crohn's disease Plan for dialysis later today. Will monitor daily for dialysis need.   2.  Anemia of CKD/thrombocytopenia:  EGD and colonoscopy unrevealing. Platelets and hemoglobin are stable.  - epo last given on 10/23 - epo scheduled with HD treatment today.   3.  SHPTH of renal origin: PTH 196, phosphorus 5.1, calcium 8.3 - not currently on binders or vitamin D agent.  - will consider calcitriol if no improvement in renal function .   4. Hypertension and edema: blood pressure at goal. Off diuretics.  - will attempt ultrafiltration on hemodialysis later today.     LOS: [place], [place] 10/29/20161:41 PM

## 2015-02-08 NOTE — Progress Notes (Signed)
Spoke with Dr. Luberta Mutter by phone to clarify orders for guiac with every stool (received order to d/c), and to notify physician for diastolic <60 (patient's diastolic runs 30's to 40's and is asymptomatic - per Dr. Nemiah Commander do not need to notify MD for low diastolic).

## 2015-02-08 NOTE — Care Management Note (Addendum)
When patient discharges she will need to follow up with Dr. Thedore Mins office as instructed  for additional out patient dialysis needs and monitoring of kidney function.  All records have been sent to [facility] for a new AKI admission to the clinic and insurance confirmed that clinic is in network if AKI dialysis is needed as outpatient.  Tentaitive schedule if she does require dialysis treatments will be on a TTS schedule 2nd shift (around 11:00 am).  Dr. Doristine Church office will need to contact dialysis center of need for treatment prior to sending patient to the clinic for AKI treatment.as I have not been able to furnish them with a start date at discharge.   Ivor Reining Dialysis Liaison   [phone]

## 2015-02-08 NOTE — Progress Notes (Signed)
[facility] - [place] at [facility]   PATIENT NAME: Brandi Cline    MR#:  [id number]  DATE OF BIRTH:  11/07/1930  SUBJECTIVE:  CHIEF COMPLAINT:  No chief complaint on file.  - Creatinine worsened without dialysis for 3 days now. Since that have dialysis today. And will also need outpatient dialysis -Feels weak and seems to understand that she will need to go to rehabilitation. Agreeable to go to rehabilitation at this time. -Hemoglobin improved, did not receive any blood transfusion. Continue to monitor  REVIEW OF SYSTEMS:  Review of Systems  Constitutional: Negative for fever and chills.  HENT: Negative for ear discharge, ear pain and tinnitus.   Eyes: Negative for blurred vision and double vision.  Respiratory: Positive for shortness of breath. Negative for cough and wheezing.   Cardiovascular: Positive for orthopnea and leg swelling. Negative for chest pain and palpitations.  Gastrointestinal: Negative for heartburn, nausea, vomiting, abdominal pain, diarrhea and constipation.  Genitourinary: Negative for dysuria and urgency.  Musculoskeletal: Negative for myalgias.  Neurological: Positive for weakness. Negative for dizziness, tingling, tremors, seizures and headaches.  Psychiatric/Behavioral: Negative for depression.    DRUG ALLERGIES:   Allergies  Allergen Reactions  . Avelox [Moxifloxacin Hcl In Nacl] Shortness Of Breath, Swelling and Rash  . Codeine Shortness Of Breath and Other (See Comments)    Couldn't breath  . Theophyllines     Slight increased heart rate  . Ciprofloxacin Other (See Comments)    nervous  . Milk-Related Compounds Other (See Comments)    Whole milk hurts stomach. Not all dairy products do this.  . Tape     Please use paper tape only  . Lisinopril Rash    VITALS:  Blood pressure 116/40, pulse 74, temperature 98.1 F (36.7 C), temperature source Oral, resp. rate 18, height 5\' 4"  (1.626 m), weight 69.355 kg (152 lb 14.4  oz), SpO2 97 %.  PHYSICAL EXAMINATION:  Physical Exam  GENERAL:  81 y.o.-year-old patient lying in the bed with no acute distress.  Gets out of breath easily with minimal exertion EYES: Pupils equal, round, reactive to light and accommodation. No scleral icterus. Extraocular muscles intact.  HEENT: Head atraumatic, normocephalic. Oropharynx and nasopharynx clear.  NECK:  Supple, no jugular venous distention. No thyroid enlargement, no tenderness.  LUNGS: Normal breath sounds bilaterally, no wheezing, or rhonchi. Some crackles at bases. No use of accessory muscles of respiration.  CARDIOVASCULAR: S1, S2 normal. No rubs, or gallops. 3/6 systolic murmur heard ABDOMEN: Soft, nontender, nondistended. Bowel sounds present. No organomegaly or mass.  EXTREMITIES: worsened 3+ pedal edema on both feet today. No cyanosis, or clubbing.  NEUROLOGIC: Cranial nerves II through XII are intact. Muscle strength 5/5 in all extremities. Sensation intact. Gait not checked.  PSYCHIATRIC: The patient is alert and oriented x 3.  SKIN: No obvious rash, lesion, or ulcer.    LABORATORY PANEL:   CBC  Recent Labs Lab 02/08/15 0753  WBC 3.6  HGB 8.1*  HCT 25.2*  PLT 119*   ------------------------------------------------------------------------------------------------------------------  Chemistries   Recent Labs Lab 02/08/15 0753  NA 141  K 3.6  CL 100*  CO2 30  GLUCOSE 91  BUN 44*  CREATININE 5.48*  CALCIUM 8.3*   ------------------------------------------------------------------------------------------------------------------  Cardiac Enzymes No results for input(s): TROPONINI in the last 168 hours. ------------------------------------------------------------------------------------------------------------------  RADIOLOGY:  No results found.  EKG:   Orders placed or performed during the hospital encounter of 01/22/15  . EKG 12-Lead  . EKG 12-Lead  .  EKG 12-Lead  . EKG 12-Lead  .  EKG 12-Lead  . EKG 12-Lead    ASSESSMENT AND PLAN:   # Acute renal failure on CKD3  -Renal biopsy - ATN and oxalate crystals - Likely ATN from overdiuresis with underlying cirrhosis - s/p dialysis catheter placed 10/21 and had 3-4 sessions of HD - last HD on 10/25. Now creatinine worsening off dialysis, will need dialysis again- today - might be progressing to ESRD, but for now considered to ARF - monitor I&O, no lasix for now per nephrology - outpatient 1-2 times/week dialysis needed- need to be set up - patient requesting to go to rehab now  # Left lower extremity cellulitis and edema- Improving - not on ABX now - elevate the legs and dialysis for fluid removal  # Chronic respiratory failure on 3 L nasal cannula due to COPD due to smoking - findings of emphysema on CT chest. -Follows with pulmonologist as outpatient. -Also possibility of alpha-1 antitrypsin deficiency being considered as outpatient. -Currently on 4 L oxygen. Bronchoscopy done with no significant findings. Lavage cultures with no organisms - CXR with pulm edema   # Acute on chronic anemia: Anemia of chronic disease. -colonoscopy & EGD performed on 10/24 wnl, -Patient on Epogen with dialysis. Was also following at the cancer center in the past for darbepoetin - hb stable, transfuse if hb <7  # Anxiety: continue xanax as she takes this at home.  # Thrombocytopenia/ coagulopathy likely due to liver disease And crohns disease Appreciate oncology recs Discontinued Lovenox and heparin flushes and heparin given during dialysis.  Improved plts now.  Physical Therapy consult requested, patient needs SNF. Patient has agreed to go to [facility] at this time. Likely discharge as soon as bed available, may be early next week. Patient will follow up with nephrology for her dialysis as outpatient and her acute renal failure  All the records are reviewed and case discussed with Care Management/Social  Workerr. Management plans discussed with the patient, family and they are in agreement.  CODE STATUS: DNR  TOTAL TIME TAKING CARE OF THIS PATIENT: 38 minutes.   POSSIBLE D/C IN 2-3 DAYS, DEPENDING ON CLINICAL CONDITION.   Tykee Heideman M.D on 02/08/2015 at 11:07 AM  Between 7am to 6pm - Pager - [phone]  After 6pm go to [email or web address] - password [email or web address] [facility]  [place] [facility]  Office  [phone]  CC: Primary care physician; [name], MD

## 2015-02-09 LAB — BASIC METABOLIC PANEL
Anion gap: 8 (ref 5–15)
BUN: 26 mg/dL — ABNORMAL HIGH (ref 6–20)
CALCIUM: 8.1 mg/dL — AB (ref 8.9–10.3)
CO2: 31 mmol/L (ref 22–32)
CREATININE: 3.56 mg/dL — AB (ref 0.44–1.00)
Chloride: 102 mmol/L (ref 101–111)
GFR calc non Af Amer: 11 mL/min — ABNORMAL LOW (ref >60)
GFR, EST AFRICAN AMERICAN: 13 mL/min — AB (ref >60)
Glucose, Bld: 93 mg/dL (ref 65–99)
Potassium: 3.4 mmol/L — ABNORMAL LOW (ref 3.5–5.1)
SODIUM: 141 mmol/L (ref 135–145)

## 2015-02-09 NOTE — Progress Notes (Signed)
[facility] - [place] at [facility]   PATIENT NAME: Brandi Cline    MR#:  [id number]  DATE OF BIRTH:  08-Oct-1930  SUBJECTIVE:   - Creatinine worsened without dialysis for 3 days now. Now improved with HD-Feels weak and seems to understand that she will need to go to rehabilitation. Agreeable to go to rehabilitation at this time. -Hemoglobin improved, did not receive any blood transfusion. -feels a lot better today REVIEW OF SYSTEMS:  Review of Systems  Constitutional: Negative for fever and chills.  HENT: Negative for ear discharge, ear pain and tinnitus.   Eyes: Negative for blurred vision and double vision.  Respiratory: Positive for shortness of breath. Negative for cough and wheezing.   Cardiovascular: Positive for orthopnea and leg swelling. Negative for chest pain and palpitations.  Gastrointestinal: Negative for heartburn, nausea, vomiting, abdominal pain, diarrhea and constipation.  Genitourinary: Negative for dysuria and urgency.  Musculoskeletal: Negative for myalgias.  Neurological: Positive for weakness. Negative for dizziness, tingling, tremors, seizures and headaches.  Psychiatric/Behavioral: Negative for depression.    DRUG ALLERGIES:   Allergies  Allergen Reactions  . Avelox [Moxifloxacin Hcl In Nacl] Shortness Of Breath, Swelling and Rash  . Codeine Shortness Of Breath and Other (See Comments)    Couldn't breath  . Theophyllines     Slight increased heart rate  . Ciprofloxacin Other (See Comments)    nervous  . Milk-Related Compounds Other (See Comments)    Whole milk hurts stomach. Not all dairy products do this.  . Tape     Please use paper tape only  . Lisinopril Rash    VITALS:  Blood pressure 143/47, pulse 85, temperature 98.4 F (36.9 C), temperature source Oral, resp. rate 18, height  (1.626 m), weight 75.161 kg (165 lb 11.2 oz), SpO2 94 %.  PHYSICAL EXAMINATION:  Physical Exam  GENERAL:  81 y.o.-year-old patient  lying in the bed with no acute distress.  Gets out of breath easily with minimal exertion EYES: Pupils equal, round, reactive to light and accommodation. No scleral icterus. Extraocular muscles intact.  HEENT: Head atraumatic, normocephalic. Oropharynx and nasopharynx clear.  NECK:  Supple, no jugular venous distention. No thyroid enlargement, no tenderness.  LUNGS: Normal breath sounds bilaterally, no wheezing, or rhonchi. Some crackles at bases. No use of accessory muscles of respiration.  CARDIOVASCULAR: S1, S2 normal. No rubs, or gallops. 3/6 systolic murmur heard ABDOMEN: Soft, nontender, nondistended. Bowel sounds present. No organomegaly or mass.  EXTREMITIES: worsened 3+ pedal edema on both feet today. No cyanosis, or clubbing.  NEUROLOGIC: Cranial nerves II through XII are intact. Muscle strength 5/5 in all extremities. Sensation intact. Gait not checked.  PSYCHIATRIC: The patient is alert and oriented x 3.  SKIN: No obvious rash, lesion, or ulcer.    LABORATORY PANEL:   CBC  Recent Labs Lab 02/08/15 0753  WBC 3.6  HGB 8.1*  HCT 25.2*  PLT 119*   ------------------------------------------------------------------------------------------------------------------  Chemistries   Recent Labs Lab 02/09/15 0659  NA 141  K 3.4*  CL 102  CO2 31  GLUCOSE 93  BUN 26*  CREATININE 3.56*  CALCIUM 8.1*    ASSESSMENT AND PLAN:   # Acute renal failure on CKD3  -Renal biopsy - ATN and oxalate crystals - Likely ATN from overdiuresis with underlying cirrhosis - s/p dialysis catheter placed 10/21 and had 3-4 sessions of HD - last HD on 10/25. Now creatinine worsening off dialysis, got dialysis again yday - might be progressing to  ESRD, but for now considered to ARF - monitor I&O, no lasix for now per nephrology - outpatient 1-2 times/week dialysis needed- need to be set up - patient requesting to go to rehab now  # Left lower extremity cellulitis and edema- Improving -  not on ABX now - elevate the legs and dialysis for fluid removal  # Chronic respiratory failure on 3 L nasal cannula due to COPD due to smoking - findings of emphysema on CT chest. -Follows with pulmonologist as outpatient. -Also possibility of alpha-1 antitrypsin deficiency being considered as outpatient. -Currently on 4 L oxygen. Bronchoscopy done with no significant findings. Lavage cultures with no organisms - CXR with pulm edema   # Acute on chronic anemia: Anemia of chronic disease. -colonoscopy & EGD performed on 10/24 wnl, -Patient on Epogen with dialysis. Was also following at the cancer center in the past for darbepoetin - hb stable, transfuse if hb <7  # Anxiety: continue xanax as she takes this at home.  # Thrombocytopenia/ coagulopathy likely due to liver disease And crohns disease Appreciate oncology recs Discontinued Lovenox and heparin flushes and heparin given during dialysis.  Improved plts now.  Physical Therapy consult requested, patient needs SNF. Patient has agreed to go to [facility] at this time. Likely discharge as soon as bed available, may be early next week. Patient will follow up with nephrology for her dialysis as outpatient and her acute renal failure  All the records are reviewed and case discussed with Care Management/Social Workerr. Management plans discussed with the patient, family and they are in agreement.  CODE STATUS: DNR  TOTAL TIME TAKING CARE OF THIS PATIENT: 38 minutes.   POSSIBLE D/C IN 2-3 DAYS, DEPENDING ON CLINICAL CONDITION.   Najee Cowens M.D on 02/09/2015 at 2:08 PM  Between 7am to 6pm - Pager - [phone]  After 6pm go to [email or web address] - password [email or web address] [facility]  [place] [facility]  Office  [phone]  CC: Primary care physician; [name], MD

## 2015-02-09 NOTE — Progress Notes (Signed)
Subjective:   Hemodialysis yesterday. Tolerated treatment well. UF of 1 litre Continues to have peripheral edema  Objective:  Vital signs in last 24 hours:  Temp:  [98.3 F (36.8 C)-98.8 F (37.1 C)] 98.8 F (37.1 C) (10/30 0630) Pulse Rate:  [68-81] 76 (10/30 0630) Resp:  [16-23] 17 (10/30 0630) BP: (108-154)/(30-58) 108/35 mmHg (10/30 0630) SpO2:  [94 %-97 %] 94 % (10/30 0740) FiO2 (%):  [36 %] 36 % (10/30 0740) Weight:  [75.161 kg (165 lb 11.2 oz)] 75.161 kg (165 lb 11.2 oz) (10/30 0500)  Weight change: 5.806 kg (12 lb 12.8 oz) Filed Weights   02/07/15 0500 02/08/15 0526 02/09/15 0500  Weight: 75.615 kg (166 lb 11.2 oz) 69.355 kg (152 lb 14.4 oz) 75.161 kg (165 lb 11.2 oz)    Intake/Output: I/O last 3 completed shifts: In: 840 [P.O.:840] Out: 1345 [Urine:345; Other:1000]   Intake/Output this shift:  Total I/O In: 100 [P.O.:100] Out: 50 [Urine:50]  Physical Exam: General: NAD  Head: Normocephalic, atraumatic. Moist oral mucosal membranes  Eyes: Anicteric  Neck: Supple, trachea midline  Lungs:  Clear to auscultation normal effort  Heart: Regular rate and rhythm no rubs  Abdomen:  Soft, nontender, BS present, distended   Extremities:  1+ peripheral edema.  Neurologic: Nonfocal, moving all four extremities  Skin: No lesions  Access: Right IJ dialysis catheter.    Basic Metabolic Panel:  Recent Labs Lab 02/04/15 1048 02/05/15 0347 02/06/15 0940 02/07/15 0338 02/08/15 0753 02/09/15 0659  NA 137 140 140 139 141 141  K 3.6 3.4* 4.1 3.2* 3.6 3.4*  CL 95* 99* 98* 100* 100* 102  CO2 30 33* 29 29 30 31   GLUCOSE 182* 87 186* 101* 91 93  BUN 60* 25* 35* 38* 44* 26*  CREATININE 5.73* 3.39* 4.41* 4.53* 5.48* 3.56*  CALCIUM 8.2* 8.2* 8.8* 8.2* 8.3* 8.1*  PHOS 5.1*  --   --   --   --   --     Liver Function Tests:  Recent Labs Lab 02/04/15 1048  ALBUMIN 2.6*   No results for input(s): LIPASE, AMYLASE in the last 168 hours. No results for input(s):  AMMONIA in the last 168 hours.  CBC:  Recent Labs Lab 02/03/15 0709 02/04/15 0921 02/05/15 0347 02/07/15 0338 02/08/15 0753  WBC 3.2* 4.7 3.2* 3.6 3.6  NEUTROABS 2.0  --   --   --   --   HGB 7.7* 8.7* 7.7* 7.2* 8.1*  HCT 24.2* 27.3* 24.2* 22.4* 25.2*  MCV 91.0 91.3 90.9 90.6 89.6  PLT 98* 132* 94* 103* 119*    Cardiac Enzymes: No results for input(s): CKTOTAL, CKMB, CKMBINDEX, TROPONINI in the last 168 hours.  BNP: Invalid input(s): POCBNP  CBG: No results for input(s): GLUCAP in the last 168 hours.  Microbiology: Results for orders placed or performed during the hospital encounter of 01/22/15  C difficile quick scan w PCR reflex     Status: None   Collection Time: 01/23/15  4:46 PM  Result Value Ref Range Status   C Diff antigen NEGATIVE NEGATIVE Final   C Diff toxin NEGATIVE NEGATIVE Final   C Diff interpretation Negative for C. difficile  Final  Culture, bal-quantitative     Status: None   Collection Time: 02/05/15  2:12 PM  Result Value Ref Range Status   Specimen Description BRONCHIAL ALVEOLAR LAVAGE  Final   Special Requests Normal  Final   Gram Stain   Final    MODERATE WBC SEEN NO ORGANISMS  SEEN EXCELLENT SPECIMEN - 90-100% WBCS    Culture NO GROWTH 2 DAYS  Final   Report Status 02/07/2015 FINAL  Final    Coagulation Studies: No results for input(s): LABPROT, INR in the last 72 hours.  Urinalysis: No results for input(s): COLORURINE, LABSPEC, PHURINE, GLUCOSEU, HGBUR, BILIRUBINUR, KETONESUR, PROTEINUR, UROBILINOGEN, NITRITE, LEUKOCYTESUR in the last 72 hours.  Invalid input(s): APPERANCEUR    Imaging: No results found.   Medications:   . anticoagulant sodium citrate     . arformoterol  15 mcg Nebulization BID  . feeding supplement (ENSURE ENLIVE)  237 mL Oral Q24H  . pantoprazole  40 mg Oral Daily  . tiotropium  18 mcg Inhalation Daily   acetaminophen **OR** acetaminophen, albuterol, ALPRAZolam, alteplase, anticoagulant sodium citrate,  benzonatate, dexamethasone, fentaNYL (SUBLIMAZE) injection, guaiFENesin-dextromethorphan, lidocaine (PF), lidocaine-prilocaine, loperamide, nitroGLYCERIN, ondansetron **OR** ondansetron (ZOFRAN) IV, pentafluoroprop-tetrafluoroeth, polyethylene glycol  Assessment/ Plan:  81 y.o. female with past medical history of coronary disease- with coronary stents, Hypertension, Crohn's disease, COPD, uses home oxygen around the clock, Kidney stones- lithotripsy, ureteral stents, History of shingles, Arthritis, Hyperlipidemia, Partial resection of bowel for Crohn's disease, liver cirrhosis.   1.  Acute renal failure secondary to severe ATN/CKD stage III.  Baseline creatinine 1.28 in July 2016 The patient has had progressively worsening renal function since July of 2016.  EGFR was down to 6 as outpt most recently.Preliminary renal biopsy report show severe ATN and oxalate crystals. Possibly from overdiuresis in the setting of cirrhosis. Oxalate crystals are likely related to malabsorption from Crohn's disease Will monitor daily for dialysis need. Outpatient planning in works for acute dialysis.   2.  Anemia of CKD/thrombocytopenia:  EGD and colonoscopy unrevealing. Platelets and hemoglobin are stable.  - epo scheduled with HD treatment.   3.  SHPTH of renal origin: PTH 196, phosphorus 5.1, calcium 8.1 - not currently on binders or vitamin D agent.  - will consider calcitriol if no improvement in renal function .   4. Hypertension and edema: blood pressure at goal. Off diuretics.     LOS: 18 Renaldo Gornick 10/30/201610:16 AM

## 2015-02-10 MED ORDER — ALPRAZOLAM 1 MG PO TABS
1.0000 mg | ORAL_TABLET | Freq: Three times a day (TID) | ORAL | Status: DC | PRN
  Administered 2015-02-10 – 2015-02-11 (×2): 1 mg via ORAL
  Filled 2015-02-10 (×2): qty 1

## 2015-02-10 NOTE — Progress Notes (Signed)
HD tx start 

## 2015-02-10 NOTE — Progress Notes (Deleted)
JP drain noted to have no output but drainage was noted around tube. Bulb was not pushed in to create suctioning. Once done JP began to drain effectively.

## 2015-02-10 NOTE — Progress Notes (Signed)
Post hd tx 

## 2015-02-10 NOTE — Progress Notes (Signed)
[facility] - [place] at [facility]   PATIENT NAME: Brandi Cline    MR#:  [id number]  DATE OF BIRTH:  1930/06/01  SUBJECTIVE:  Sob with lot of leg edema  REVIEW OF SYSTEMS:  Review of Systems  Constitutional: Negative for fever and chills.  HENT: Negative for ear discharge, ear pain and tinnitus.   Eyes: Negative for blurred vision and double vision.  Respiratory: Positive for shortness of breath. Negative for cough and wheezing.   Cardiovascular: Positive for orthopnea and leg swelling. Negative for chest pain and palpitations.  Gastrointestinal: Negative for heartburn, nausea, vomiting, abdominal pain, diarrhea and constipation.  Genitourinary: Negative for dysuria and urgency.  Musculoskeletal: Negative for myalgias.  Neurological: Positive for weakness. Negative for dizziness, tingling, tremors, seizures and headaches.  Psychiatric/Behavioral: Negative for depression.   DRUG ALLERGIES:   Allergies  Allergen Reactions  . Avelox [Moxifloxacin Hcl In Nacl] Shortness Of Breath, Swelling and Rash  . Codeine Shortness Of Breath and Other (See Comments)    Couldn't breath  . Theophyllines     Slight increased heart rate  . Ciprofloxacin Other (See Comments)    nervous  . Milk-Related Compounds Other (See Comments)    Whole milk hurts stomach. Not all dairy products do this.  . Tape     Please use paper tape only  . Lisinopril Rash    VITALS:  Blood pressure 105/37, pulse 67, temperature 98.2 F (36.8 C), temperature source Oral, resp. rate 20, height  (1.626 m), weight 75.7 kg (166 lb 14.2 oz), SpO2 100 %.  PHYSICAL EXAMINATION:  Physical Exam  GENERAL:  81 y.o.-year-old patient lying in the bed with no acute distress.  Gets out of breath easily with minimal exertion EYES: Pupils equal, round, reactive to light and accommodation. No scleral icterus. Extraocular muscles intact.  HEENT: Head atraumatic, normocephalic. Oropharynx and nasopharynx  clear.  NECK:  Supple, no jugular venous distention. No thyroid enlargement, no tenderness.  LUNGS: Normal breath sounds bilaterally, no wheezing, or rhonchi. Some crackles at bases. No use of accessory muscles of respiration.  CARDIOVASCULAR: S1, S2 normal. No rubs, or gallops. 3/6 systolic murmur heard ABDOMEN: Soft, nontender, nondistended. Bowel sounds present. No organomegaly or mass.  EXTREMITIES: worsened 3+ pedal edema on both feet today. No cyanosis, or clubbing.  NEUROLOGIC: Cranial nerves II through XII are intact. Muscle strength 5/5 in all extremities. Sensation intact. Gait not checked.  PSYCHIATRIC: The patient is alert and oriented x 3.  SKIN: No obvious rash, lesion, or ulcer.    LABORATORY PANEL:   CBC  Recent Labs Lab 02/08/15 0753  WBC 3.6  HGB 8.1*  HCT 25.2*  PLT 119*   ------------------------------------------------------------------------------------------------------------------  Chemistries   Recent Labs Lab 02/09/15 0659  NA 141  K 3.4*  CL 102  CO2 31  GLUCOSE 93  BUN 26*  CREATININE 3.56*  CALCIUM 8.1*    ASSESSMENT AND PLAN:   # Acute renal failure --->now to ESRD -Renal biopsy - ATN and oxalate crystals - Likely ATN from overdiuresis with underlying cirrhosis - s/p dialysis catheter placed 10/21  - might be progressing to ESRD, but for now considered to ARF - monitor I&O, no lasix for now per nephrology - patient requesting to go to rehab now -HD today for UF. Appears volume overloaded  # Left lower extremity cellulitis and edema- Improving - not on ABX now - elevate the legs and dialysis for fluid removal  # Chronic respiratory failure on 3  L nasal cannula due to COPD due to smoking - findings of emphysema on CT chest. -Follows with pulmonologist as outpatient. -Also possibility of alpha-1 antitrypsin deficiency being considered as outpatient. -Currently on 4 L oxygen. Bronchoscopy done with no significant findings. Lavage  cultures with no organisms - CXR with pulm edema   # Acute on chronic anemia: Anemia of chronic disease. -colonoscopy & EGD performed on 10/24 wnl, -Patient on Epogen with dialysis. Was also following at the cancer center in the past for darbepoetin - hb stable, transfuse if hb <7  # Anxiety: continue xanax as she takes this at home.  # Thrombocytopenia/ coagulopathy likely due to liver disease And crohns disease Appreciate oncology recs Discontinued Lovenox and heparin flushes and heparin given during dialysis.  Improved plts now.  Physical Therapy consult requested, patient needs SNF. await insurance auth for STR  All the records are reviewed and case discussed with Care Management/Social Workerr. Management plans discussed with the patient, family (dter and son) and they are in agreement.  CODE STATUS: DNR  TOTAL TIME TAKING CARE OF THIS PATIENT: 38 minutes.   POSSIBLE D/C IN 2-3 DAYS, DEPENDING ON CLINICAL CONDITION.   Solymar Grace M.D on 02/10/2015 at 2:53 PM  Between 7am to 6pm - Pager - [phone]  After 6pm go to [email or web address] - password [email or web address] [facility]  [facility]  Office  [phone]  CC: Primary care physician; [name], MD

## 2015-02-10 NOTE — Progress Notes (Signed)
Pre-hd tx 

## 2015-02-10 NOTE — Progress Notes (Signed)
HD tx completed.

## 2015-02-10 NOTE — Progress Notes (Signed)
Spoke to on call MD about possible LLE cellulitis. Acknowledge. Plan to reassess plan of care tomorrow when MD's rounding.

## 2015-02-10 NOTE — Clinical Social Work Note (Signed)
Patient has decided to consider STR now. PT saw patient today and continues to recommend STR but patient actually did decrease in her length of ambulation. After speaking at length with patient and her two children in her room, patient does wish for CSW to pursue STR and has chosen [facility]. Doug at [facility] is aware and will begin [place] with [employer]. MD states patient will be ready for discharge tomorrow.  York Spaniel MSW,LCSW [phone]

## 2015-02-10 NOTE — Care Management Important Message (Signed)
Important Message  Patient Details  Name: Brandi Cline MRN: [id number] Date of Birth: 08-06-1930   Medicare Important Message Given:  Yes-second notification given    Olegario Messier A Delane Wessinger 02/10/2015, 10:10 AM

## 2015-02-10 NOTE — Progress Notes (Signed)
Subjective:   Patient was significantly short of breath this morning with minimal exertion She was dyspneic getting from bathroom to her bed She continues to have lower extremity edema Urine output remains poor  Objective:  Vital signs in last 24 hours:  Temp:  [98.1 F (36.7 C)-98.4 F (36.9 C)] 98.2 F (36.8 C) (10/31 0458) Pulse Rate:  [82-87] 86 (10/31 1040) Resp:  [18-22] 22 (10/31 0458) BP: (123-143)/(31-47) 123/46 mmHg (10/31 0458) SpO2:  [92 %-99 %] 92 % (10/31 1040) FiO2 (%):  [36 %] 36 % (10/31 0842) Weight:  [75.116 kg (165 lb 9.6 oz)] 75.116 kg (165 lb 9.6 oz) (10/31 0500)  Weight change: -0.045 kg (-1.6 oz) Filed Weights   02/08/15 0526 02/09/15 0500 02/10/15 0500  Weight: 69.355 kg (152 lb 14.4 oz) 75.161 kg (165 lb 11.2 oz) 75.116 kg (165 lb 9.6 oz)    Intake/Output: I/O last 3 completed shifts: In: 750 [P.O.:750] Out: 300 [Urine:300]   Intake/Output this shift:  Total I/O In: 0  Out: 50 [Urine:50]  Physical Exam: General: NAD, frail, elderly   Head: Normocephalic, atraumatic. Moist oral mucosal membranes  Eyes: Anicteric  Neck: Supple, trachea midline  Lungs:   bi basilar crackles, oxygen by nasal cannula   Heart: Regular rate and rhythm no rubs  Abdomen:  Soft, nontender, BS present, distended   Extremities:  1+ peripheral edema. b/l  Neurologic: Nonfocal, moving all four extremities  Skin: No lesions  Access: Right IJ dialysis catheter.    Basic Metabolic Panel:  Recent Labs Lab 02/04/15 1048 02/05/15 0347 02/06/15 0940 02/07/15 0338 02/08/15 0753 02/09/15 0659  NA 137 140 140 139 141 141  K 3.6 3.4* 4.1 3.2* 3.6 3.4*  CL 95* 99* 98* 100* 100* 102  CO2 30 33* 29 29 30 31   GLUCOSE 182* 87 186* 101* 91 93  BUN 60* 25* 35* 38* 44* 26*  CREATININE 5.73* 3.39* 4.41* 4.53* 5.48* 3.56*  CALCIUM 8.2* 8.2* 8.8* 8.2* 8.3* 8.1*  PHOS 5.1*  --   --   --   --   --     Liver Function Tests:  Recent Labs Lab 02/04/15 1048  ALBUMIN 2.6*    No results for input(s): LIPASE, AMYLASE in the last 168 hours. No results for input(s): AMMONIA in the last 168 hours.  CBC:  Recent Labs Lab 02/04/15 0921 02/05/15 0347 02/07/15 0338 02/08/15 0753  WBC 4.7 3.2* 3.6 3.6  HGB 8.7* 7.7* 7.2* 8.1*  HCT 27.3* 24.2* 22.4* 25.2*  MCV 91.3 90.9 90.6 89.6  PLT 132* 94* 103* 119*    Cardiac Enzymes: No results for input(s): CKTOTAL, CKMB, CKMBINDEX, TROPONINI in the last 168 hours.  BNP: Invalid input(s): POCBNP  CBG: No results for input(s): GLUCAP in the last 168 hours.  Microbiology: Results for orders placed or performed during the hospital encounter of 01/22/15  C difficile quick scan w PCR reflex     Status: None   Collection Time: 01/23/15  4:46 PM  Result Value Ref Range Status   C Diff antigen NEGATIVE NEGATIVE Final   C Diff toxin NEGATIVE NEGATIVE Final   C Diff interpretation Negative for C. difficile  Final  Culture, bal-quantitative     Status: None   Collection Time: 02/05/15  2:12 PM  Result Value Ref Range Status   Specimen Description BRONCHIAL ALVEOLAR LAVAGE  Final   Special Requests Normal  Final   Gram Stain   Final    MODERATE WBC SEEN NO ORGANISMS  SEEN EXCELLENT SPECIMEN - 90-100% WBCS    Culture NO GROWTH 2 DAYS  Final   Report Status 02/07/2015 FINAL  Final    Coagulation Studies: No results for input(s): LABPROT, INR in the last 72 hours.  Urinalysis: No results for input(s): COLORURINE, LABSPEC, PHURINE, GLUCOSEU, HGBUR, BILIRUBINUR, KETONESUR, PROTEINUR, UROBILINOGEN, NITRITE, LEUKOCYTESUR in the last 72 hours.  Invalid input(s): APPERANCEUR    Imaging: No results found.   Medications:   . anticoagulant sodium citrate     . arformoterol  15 mcg Nebulization BID  . feeding supplement (ENSURE ENLIVE)  237 mL Oral Q24H  . pantoprazole  40 mg Oral Daily  . tiotropium  18 mcg Inhalation Daily   acetaminophen **OR** acetaminophen, albuterol, ALPRAZolam, alteplase, anticoagulant  sodium citrate, benzonatate, dexamethasone, fentaNYL (SUBLIMAZE) injection, guaiFENesin-dextromethorphan, lidocaine (PF), lidocaine-prilocaine, loperamide, nitroGLYCERIN, ondansetron **OR** ondansetron (ZOFRAN) IV, pentafluoroprop-tetrafluoroeth, polyethylene glycol  Assessment/ Plan:  81 y.o. female with past medical history of coronary disease- with coronary stents, Hypertension, Crohn's disease, COPD, uses home oxygen around the clock, Kidney stones- lithotripsy, ureteral stents, History of shingles, Arthritis, Hyperlipidemia, Partial resection of bowel for Crohn's disease, liver cirrhosis.   1.  ESRD likely secondary to severe ATN/CKD stage III.  No renal recovery. Baseline creatinine 1.28 in July 2016 The patient has had progressively worsening renal function since July of 2016.  EGFR was down to 6 as outpt most recently. Renal biopsy report show severe ATN and oxalate crystals. Possibly from overdiuresis in the setting of cirrhosis. Oxalate crystals are likely related to malabsorption from Crohn's disease We'll perform urgent dialysis today for fluid removal Continue her outpatient dialysis set up  2.  Anemia of CKD/thrombocytopenia:  EGD and colonoscopy unrevealing. Platelets and hemoglobin are stable.  - epo scheduled with HD treatment.   3.  SHPTH of renal origin: PTH 196, phosphorus 5.1, calcium 8.1 - not currently on binders or vitamin D agent.   4. Hypertension and edema: blood pressure at goal. Off diuretics.     LOS: 19 Carson Bogden 10/31/201611:22 AM

## 2015-02-10 NOTE — Progress Notes (Signed)
Physical Therapy Treatment Patient Details Name: Brandi Cline MRN: [id number] DOB: 1930/10/31 Today's Date: 02/10/2015    History of Present Illness Pt is an 81 y.o. female presenting to hospital 01/22/15 with SOB, LE edema, and L leg pain and redness.  Pt s/p 01/22/15 R femoral temporary dialysis catheter placement (now removed and pt s/p permcath R IJ 01/31/15).  Pt s/p renal biopsy 01/29/15, s/p EGD 02/03/15, and s/p bronch 02/05/15.  Pt with recent hospital stay 9/30-10/5/16 for acute renal failure.  PMH includes CKD stage 3, COPD, chronic respiratory failure on 3-4 L/min via [place], CAD, Crohn's disease, htn.    PT Comments    Pt appearing with decreased activity tolerance/increased fatigue with functional mobility today. Pt demonstrating decreased foot clearance/heelstrike/step length with distance and catching front on feet on floor towards end of 75 feet ambulation with RW requiring min assist to steady.  Pt does not appear safe to discharge home alone d/t above.  Continue to recommend pt discharge to STR to improve activity tolerance, safety, balance, and independence with functional mobility.   Follow Up Recommendations  SNF     Equipment Recommendations   (pt reports having RW at home)    Recommendations for Other Services       Precautions / Restrictions Precautions Precautions: Fall Restrictions Weight Bearing Restrictions: No    Mobility  Bed Mobility Overal bed mobility: Needs Assistance Bed Mobility: Sit to Supine     Supine to sit: Supervision     General bed mobility comments: increased effort and time noted to get into bed  Transfers Overall transfer level: Needs assistance Equipment used: Rolling walker (2 wheeled) Transfers: Sit to/from Stand Sit to Stand: Min guard         General transfer comment: increased effort required to stand up to RW  Ambulation/Gait Ambulation/Gait assistance: Min guard;Min assist Ambulation Distance (Feet): 75  Feet Assistive device: Rolling walker (2 wheeled)   Gait velocity: decreased   General Gait Details: decreased B step length/foot clearance/heelstrike with distance and pt catching front of feet on floor end of ambulation a few times requiring min assist to steady; limited distance d/t fatigue   Stairs            Wheelchair Mobility    Modified Rankin (Stroke Patients Only)       Balance Overall balance assessment: Needs assistance Sitting-balance support: No upper extremity supported;Feet supported Sitting balance-Leahy Scale: Good     Standing balance support: Bilateral upper extremity supported (on RW) Standing balance-Leahy Scale: Good                      Cognition Arousal/Alertness: Awake/alert Behavior During Therapy: WFL for tasks assessed/performed Overall Cognitive Status: Within Functional Limits for tasks assessed                      Exercises      General Comments General comments (skin integrity, edema, etc.): edema noted B lower legs and feet  Nursing cleared pt for participation in physical therapy.  Pt agreeable to PT session.      Pertinent Vitals/Pain Pain Assessment: No/denies pain  Vitals stable and WFL throughout treatment session on 4 L/min O2 via nasal cannula.    Home Living                      Prior Function            PT Goals (current goals  can now be found in the care plan section) Acute Rehab PT Goals Patient Stated Goal: to go home if possible PT Goal Formulation: With patient Time For Goal Achievement: 02/17/15 Potential to Achieve Goals: Fair Progress towards PT goals: Progressing toward goals    Frequency  Min 2X/week    PT Plan Current plan remains appropriate    Co-evaluation             End of Session Equipment Utilized During Treatment: Gait belt;Oxygen Activity Tolerance: Patient limited by fatigue Patient left: in bed;with call bell/phone within reach;with bed alarm  set;with family/visitor present (B LE's elevated on pillow)     Time: 8875-7972 PT Time Calculation (min) (ACUTE ONLY): 17 min  Charges:  $Therapeutic Exercise: 8-22 mins                    G CodesHendricks Cline Feb 22, 2015, 10:51 AM Brandi Cline, PT [phone]

## 2015-02-10 NOTE — Care Management Note (Signed)
Outpatient dialysis records have been sent to [facility] [address] last week and they have all needed records from a placement standpoint but we still need a outpatient plan as to when she may start.  Her schedule will be TTS (regardless of # sessions per week) and the time will be 11:00am.  Patients insurance has been confirmed and they are in network with [employer].  If patient discharges with follow up at Nephrology office and not to start outpatient dialysis at discharge they will need to contact dialysis center prior to sending patient to clinic for 1st treatment.  Ivor Reining Dialysis Liaison  [phone]

## 2015-02-11 NOTE — Discharge Instructions (Signed)
PT  Resume HD [id number]

## 2015-02-11 NOTE — Progress Notes (Signed)
Pre-hd tx 

## 2015-02-11 NOTE — Progress Notes (Signed)
Pt d/c to [facility]; report called to Gertie Fey, Charity fundraiser at [facility]; pt to be transported via family; awaiting arrival of family

## 2015-02-11 NOTE — Clinical Social Work Note (Signed)
Brandi Cline at [facility] has stated that they will take patient today with [name] pending with the understanding that if [employer] denies STR, patient will discharge immediately or become private pay. Patient did not complete dialysis until late today and physician is discharging patient today to [facility]. CSW has explained the above to the patient and I asked if I could call one of her children and she asked that I call her daughter. CSW has contacted patient's daughter, via phone and explained the above as well and requested if she or her brother would like to transport patient today. Patient's daughter is going to call her brother and then return call to CSW. Discharge summary sent to [facility] and nurse to call report. York Spaniel MSW,LCSW [phone]

## 2015-02-11 NOTE — Care Management Note (Signed)
Patient is set to discharge this afternoon to a local SNF.  There has been a update in patients medical status from AKI to ESRD which is now documented in medical records.  Also patient has requested to stay under the direct care of Dr. Thedore Mins at outpatient dialysis.  The clinic she she has chosen is [facility] and is the only clinic her insurance will cover that will also keep with the patients wishes to have have Dr. Thedore Mins. Her current schedule will be MWF at 2:45pm with arrival time for her 1st appointment at 2:15pm(there is no other openings for earlier start of treatment). Her appox run time for treatment will be 3.5 hours.    Ivor Reining Dialysis Liaison   [phone]

## 2015-02-11 NOTE — Progress Notes (Signed)
HD tx completed.

## 2015-02-11 NOTE — Progress Notes (Signed)
Post hd tx 

## 2015-02-11 NOTE — Progress Notes (Signed)
PT Cancellation Note  Patient Details Name: Brandi Cline MRN: [id number] DOB: [birth year]   Cancelled Treatment:    Reason Eval/Treat Not Completed: Patient at procedure or test/unavailable (Pt off unit at dialysis.)  Will re-attempt PT at a later date/time.   Irving Burton Makinzey Banes 02/11/2015, 12:05 PM Hendricks Limes, PT [phone]

## 2015-02-11 NOTE — Progress Notes (Signed)
Subjective:   Underwent dialysis yesterday. 2000 cc of fluid was removed. This morning she feels better. She was able to take back by herself She is able to be more physically active without getting short of breath Continues to have lower extremity edema  Objective:  Vital signs in last 24 hours:  Temp:  [97.7 F (36.5 C)-98.2 F (36.8 C)] 97.7 F (36.5 C) (11/01 1115) Pulse Rate:  [64-86] 64 (11/01 1200) Resp:  [17-25] 21 (11/01 1200) BP: (101-137)/(34-49) 109/47 mmHg (11/01 1200) SpO2:  [93 %-100 %] 100 % (11/01 1200) FiO2 (%):  [36 %] 36 % (11/01 0809) Weight:  [68.2 kg (150 lb 5.7 oz)-75.7 kg (166 lb 14.2 oz)] 68.2 kg (150 lb 5.7 oz) (11/01 1115)  Weight change: 0.584 kg (1 lb 4.6 oz) Filed Weights   02/10/15 1709 02/11/15 0628 02/11/15 1115  Weight: 73.6 kg (162 lb 4.1 oz) 71.759 kg (158 lb 3.2 oz) 68.2 kg (150 lb 5.7 oz)    Intake/Output: I/O last 3 completed shifts: In: 0  Out: 2245 [Urine:245; Other:2000]   Intake/Output this shift:  Total I/O In: 240 [P.O.:240] Out: 95 [Urine:95]  Physical Exam: General: NAD, frail, elderly   Head: Normocephalic, atraumatic. Moist oral mucosal membranes  Eyes: Anicteric  Neck: Supple, trachea midline  Lungs:   bi basilar crackles, oxygen by nasal cannula   Heart: Regular rate and rhythm no rubs  Abdomen:  Soft, nontender, BS present, distended   Extremities:  2+ peripheral edema. b/l  Neurologic: Nonfocal, moving all four extremities  Skin: No lesions  Access: Right IJ dialysis catheter.    Basic Metabolic Panel:  Recent Labs Lab 02/05/15 0347 02/06/15 0940 02/07/15 0338 02/08/15 0753 02/09/15 0659  NA 140 140 139 141 141  K 3.4* 4.1 3.2* 3.6 3.4*  CL 99* 98* 100* 100* 102  CO2 33* _0 GLUCOSE 87 186* 101* 91 93  BUN 25* 35* 38* 44* 26*  CREATININE 3.39* 4.41* 4.53* 5.48* 3.56*  CALCIUM 8.2* 8.8* 8.2* 8.3* 8.1*    Liver Function Tests: No results for input(s): AST, ALT, ALKPHOS, BILITOT, PROT,  ALBUMIN in the last 168 hours. No results for input(s): LIPASE, AMYLASE in the last 168 hours. No results for input(s): AMMONIA in the last 168 hours.  CBC:  Recent Labs Lab 02/05/15 0347 02/07/15 0338 02/08/15 0753  WBC 3.2* 3.6 3.6  HGB 7.7* 7.2* 8.1*  HCT 24.2* 22.4* 25.2*  MCV 90.9 90.6 89.6  PLT 94* 103* 119*    Cardiac Enzymes: No results for input(s): CKTOTAL, CKMB, CKMBINDEX, TROPONINI in the last 168 hours.  BNP: Invalid input(s): POCBNP  CBG: No results for input(s): GLUCAP in the last 168 hours.  Microbiology: Results for orders placed or performed during the hospital encounter of 01/22/15  C difficile quick scan w PCR reflex     Status: None   Collection Time: 01/23/15  4:46 PM  Result Value Ref Range Status   C Diff antigen NEGATIVE NEGATIVE Final   C Diff toxin NEGATIVE NEGATIVE Final   C Diff interpretation Negative for C. difficile  Final  Culture, bal-quantitative     Status: None   Collection Time: 02/05/15  2:12 PM  Result Value Ref Range Status   Specimen Description BRONCHIAL ALVEOLAR LAVAGE  Final   Special Requests Normal  Final   Gram Stain   Final    MODERATE WBC SEEN NO ORGANISMS SEEN EXCELLENT SPECIMEN - 90-100% WBCS    Culture NO GROWTH 2  DAYS  Final   Report Status 02/07/2015 FINAL  Final    Coagulation Studies: No results for input(s): LABPROT, INR in the last 72 hours.  Urinalysis: No results for input(s): COLORURINE, LABSPEC, PHURINE, GLUCOSEU, HGBUR, BILIRUBINUR, KETONESUR, PROTEINUR, UROBILINOGEN, NITRITE, LEUKOCYTESUR in the last 72 hours.  Invalid input(s): APPERANCEUR    Imaging: No results found.   Medications:   . anticoagulant sodium citrate     . arformoterol  15 mcg Nebulization BID  . feeding supplement (ENSURE ENLIVE)  237 mL Oral Q24H  . pantoprazole  40 mg Oral Daily  . tiotropium  18 mcg Inhalation Daily   acetaminophen **OR** acetaminophen, albuterol, ALPRAZolam, alteplase, anticoagulant sodium  citrate, benzonatate, dexamethasone, fentaNYL (SUBLIMAZE) injection, guaiFENesin-dextromethorphan, lidocaine (PF), lidocaine-prilocaine, loperamide, nitroGLYCERIN, ondansetron **OR** ondansetron (ZOFRAN) IV, pentafluoroprop-tetrafluoroeth, polyethylene glycol  Assessment/ Plan:  81 y.o. female with past medical history of coronary disease- with coronary stents, Hypertension, Crohn's disease, COPD, uses home oxygen around the clock, Kidney stones- lithotripsy, ureteral stents, History of shingles, Arthritis, Hyperlipidemia, Partial resection of bowel for Crohn's disease, liver cirrhosis.   1.  ESRD likely secondary to severe ATN/CKD stage III.  No renal recovery. Baseline creatinine 1.28 in July 2016 The patient has had progressively worsening renal function since July of 2016.  EGFR was down to 6 as outpt most recently. Renal biopsy report show severe ATN and oxalate crystals. Possibly from overdiuresis in the setting of cirrhosis. Oxalate crystals are likely related to malabsorption from Crohn's disease We'll perform dialysis today for fluid removal Continue her outpatient dialysis set up She will be set up with [employer]. M-W-F third shift Patient takes 1 mg xanax prior to dialysis treatment for anxiety  2.  Anemia of CKD/thrombocytopenia:  EGD and colonoscopy unrevealing. Platelets and hemoglobin are stable.  - epo scheduled with HD treatment.   3.  SHPTH of renal origin: PTH 196, phosphorus 5.1, calcium 8.1 - not currently on binders or vitamin D agent.   4. Hypertension and edema: blood pressure at goal. Off diuretics.   5. Peripheral edema  - improving with regular dialysis   LOS: 20 Roizy Harold 11/1/201612:19 PM

## 2015-02-11 NOTE — Progress Notes (Signed)
Pt left unit for transport with son Iylah Ghuman; left unit via wheelchair accompanied by staff

## 2015-02-11 NOTE — Clinical Social Work Placement (Signed)
   CLINICAL SOCIAL WORK PLACEMENT  NOTE  Date:  02/11/2015  Patient Details  Name: Brandi Cline MRN: [id number] Date of Birth: 1930/06/09  Clinical Social Work is seeking post-discharge placement for this patient at the Skilled  Nursing Facility level of care (*CSW will initial, date and re-position this form in  chart as items are completed):  Yes   Patient/family provided with [facility] Clinical Social Work Department's list of facilities offering this level of care within the geographic area requested by the patient (or if unable, by the patient's family).  Yes   Patient/family informed of their freedom to choose among providers that offer the needed level of care, that participate in Medicare, Medicaid or managed care program needed by the patient, have an available bed and are willing to accept the patient.  Yes   Patient/family informed of [facility]'s ownership interest in [facility] and [facility], as well as of the fact that they are under no obligation to receive care at these facilities.  PASRR submitted to EDS on       PASRR number received on       Existing PASRR number confirmed on       FL2 transmitted to all facilities in geographic area requested by pt/family on 02/03/15     FL2 transmitted to all facilities within larger geographic area on       Patient informed that his/her managed care company has contracts with or will negotiate with certain facilities, including the following:        Yes   Patient/family informed of bed offers received.  Patient chooses bed at  [facility])     Physician recommends and patient chooses bed at  [facility])    Patient to be transferred to  [employer]) on 02/11/15.  Patient to be transferred to facility by  (family)     Patient family notified on 02/11/15 of transfer.  Name of family member notified:   (both daughter and son)     PHYSICIAN       Additional Comment:     _______________________________________________ York Spaniel, LCSW 02/11/2015, 4:22 PM

## 2015-02-11 NOTE — Discharge Summary (Signed)
[facility] - [place] [facility]   PATIENT NAME: Brandi Cline    MR#:  [id number]  DATE OF BIRTH:  Nov 11, 1930  DATE OF ADMISSION:  01/22/2015 ADMITTING PHYSICIAN: Gale Journey, MD  DATE OF DISCHARGE: 02/11/15  PRIMARY CARE PHYSICIAN: [name], MD    ADMISSION DIAGNOSIS:  Acute Renal Failure Edema Lower Extremity Cellulitis End Stage Renal RM 227 Anemia with heme positive stools. L  DISCHARGE DIAGNOSIS:  ESRD  SECONDARY DIAGNOSIS:   Past Medical History  Diagnosis Date  . Dyspnea   . Esophageal reflux   . CAD (coronary artery disease)   . Hypertension   . Crohn's disease (HCC)   . Allergic rhinitis   . COPD (chronic obstructive pulmonary disease) (HCC)   . History of kidney stones   . History of shingles   . Trigeminal neuralgia   . Family history of anesthesia complication     " SISTER HAD BAD FEELINGS "  . Anginal pain (HCC)   . Anxiety     ' JUST FOR MY BREATHING "  . Arthritis     mild in hands  . Hyperlipidemia   . CKD (chronic kidney disease)     HOSPITAL COURSE:   # Acute renal failure --->now to ESRD -Renal biopsy - ATN and oxalate crystals - Likely ATN from overdiuresis with underlying cirrhosis - s/p dialysis catheter placed 10/21  -s/p UF 2000 ml y'day. Feels a lot better -pt is set up for hemodialysis Monday Wednesday Friday by Dr. Thedore Mins.  # Left lower extremity cellulitis and edema- Improving.  - not on ABX now - elevate the legs and dialysis for fluid removal  # Chronic respiratory failure on 3 L nasal cannula due to COPD due to smoking - findings of emphysema on CT chest. -Follows with pulmonologist as outpatient. -Also possibility of alpha-1 antitrypsin deficiency being considered as outpatient. -Currently on 4 L oxygen. Bronchoscopy done with no significant findings. Lavage cultures with no organisms - CXR with pulm edema   # Acute on chronic anemia: Anemia of chronic disease. -colonoscopy &  EGD performed on 10/24 wnl, -Patient on Epogen with dialysis. Was also following at the cancer center in the past for darbepoetin - hb stable, transfuse if hb <7  # Anxiety: continue xanax as she takes this at home.  # Thrombocytopenia/ coagulopathy likely due to liver disease And crohns disease Appreciate oncology recs Discontinued Lovenox and heparin flushes and heparin given during dialysis.  Improved plts now.  Physical Therapy consult requested, patient needs SNF. Patient will be discharged to [facility] pending insurance authorization.  CONSULTS OBTAINED:  Treatment Team:  Mady Haagensen, MD Annice Needy, MD Ramonita Lab, MD Rosey Bath, MD  DRUG ALLERGIES:   Allergies  Allergen Reactions  . Avelox [Moxifloxacin Hcl In Nacl] Shortness Of Breath, Swelling and Rash  . Codeine Shortness Of Breath and Other (See Comments)    Couldn't breath  . Theophyllines     Slight increased heart rate  . Ciprofloxacin Other (See Comments)    nervous  . Milk-Related Compounds Other (See Comments)    Whole milk hurts stomach. Not all dairy products do this.  . Tape     Please use paper tape only  . Lisinopril Rash    DISCHARGE MEDICATIONS:   Current Discharge Medication List    CONTINUE these medications which have NOT CHANGED   Details  albuterol (PROVENTIL HFA;VENTOLIN HFA) 108 (90 BASE) MCG/ACT inhaler Inhale 2 puffs into  the lungs every 6 (six) hours as needed for wheezing or shortness of breath. Qty: 1 Inhaler, Refills: 11    albuterol (PROVENTIL) (2.5 MG/3ML) 0.083% nebulizer solution Take 3 mLs (2.5 mg total) by nebulization every 4 (four) hours as needed. Shortness of breath Qty: 75 mL, Refills: 11    ALPRAZolam (XANAX) 0.5 MG tablet TAKE ONE TABLET BY MOUTH THREE TIMES DAILY AS NEEDED FOR ANXIETY Qty: 90 tablet, Refills: 0    arformoterol (BROVANA) 15 MCG/2ML NEBU Take 2 mLs (15 mcg total) by nebulization 2 (two) times daily. Qty: 60 mL, Refills: prn     BIOTIN 5000 PO Take 2 tablets by mouth daily.    Cholecalciferol (VITAMIN D-3) 5000 UNITS TABS Take 1 tablet by mouth daily.    isosorbide mononitrate (IMDUR) 30 MG 24 hr tablet TAKE TWO TABLETS BY MOUTH DAILY Qty: 60 tablet, Refills: 11    loratadine (CLARITIN) 10 MG tablet Take 10 mg by mouth daily as needed.     magnesium oxide (MAG-OX) 400 MG tablet Take 400 mg by mouth daily.     Multiple Vitamin (MULTIVITAMIN) tablet Take 1 tablet by mouth daily.     nitroGLYCERIN (NITROSTAT) 0.4 MG SL tablet Place 1 tablet (0.4 mg total) under the tongue every 5 (five) minutes as needed. Chest pain Qty: 25 tablet, Refills: 3    omeprazole (PRILOSEC) 20 MG capsule TAKE ONE CAPSULE BY MOUTH DAILY Qty: 90 capsule, Refills: 3    Zinc Acetate, Oral, (ZINC ACETATE PO) Take 1 tablet by mouth daily.        If you experience worsening of your admission symptoms, develop shortness of breath, life threatening emergency, suicidal or homicidal thoughts you must seek medical attention immediately by calling 911 or calling your MD immediately  if symptoms less severe.  You Must read complete instructions/literature along with all the possible adverse reactions/side effects for all the Medicines you take and that have been prescribed to you. Take any new Medicines after you have completely understood and accept all the possible adverse reactions/side effects.   Please note  You were cared for by a hospitalist during your hospital stay. If you have any questions about your discharge medications or the care you received while you were in the hospital after you are discharged, you can call the unit and asked to speak with the hospitalist on call if the hospitalist that took care of you is not available. Once you are discharged, your primary care physician will handle any further medical issues. Please note that NO REFILLS for any discharge medications will be authorized once you are discharged, as it is  imperative that you return to your primary care physician (or establish a relationship with a primary care physician if you do not have one) for your aftercare needs so that they can reassess your need for medications and monitor your lab values. Today   SUBJECTIVE   Fields better  VITAL SIGNS:  Blood pressure 100/44, pulse 66, temperature 97.7 F (36.5 C), temperature source Oral, resp. rate 18, height  (1.626 m), weight 68.2 kg (150 lb 5.7 oz), SpO2 100 %.  I/O:   Intake/Output Summary (Last 24 hours) at 02/11/15 1455 Last data filed at 02/11/15 1200  Gross per 24 hour  Intake    240 ml  Output   2165 ml  Net  -1925 ml    PHYSICAL EXAMINATION:  GENERAL:  81 y.o.-year-old patient lying in the bed with no acute distress.  EYES: Pupils equal,  round, reactive to light and accommodation. No scleral icterus. Extraocular muscles intact.  HEENT: Head atraumatic, normocephalic. Oropharynx and nasopharynx clear.  NECK:  Supple, no jugular venous distention. No thyroid enlargement, no tenderness.  LUNGS: Normal breath sounds bilaterally, no wheezing, rales,rhonchi or crepitation. No use of accessory muscles of respiration.  CARDIOVASCULAR: S1, S2 normal. No murmurs, rubs, or gallops.  ABDOMEN: Soft, non-tender, non-distended. Bowel sounds present. No organomegaly or mass.  EXTREMITIES:++ pedal edema, no cyanosis, or clubbing.  NEUROLOGIC: Cranial nerves II through XII are intact. Muscle strength 5/5 in all extremities. Sensation intact. Gait not checked.  PSYCHIATRIC: The patient is alert and oriented x 3.  SKIN: No obvious rash, lesion, or ulcer.   DATA REVIEW:   CBC   Recent Labs Lab 02/08/15 0753  WBC 3.6  HGB 8.1*  HCT 25.2*  PLT 119*    Chemistries   Recent Labs Lab 02/09/15 0659  NA 141  K 3.4*  CL 102  CO2 31  GLUCOSE 93  BUN 26*  CREATININE 3.56*  CALCIUM 8.1*    Microbiology Results   Recent Results (from the past 240 hour(s))  Culture,  bal-quantitative     Status: None   Collection Time: 02/05/15  2:12 PM  Result Value Ref Range Status   Specimen Description BRONCHIAL ALVEOLAR LAVAGE  Final   Special Requests Normal  Final   Gram Stain   Final    MODERATE WBC SEEN NO ORGANISMS SEEN EXCELLENT SPECIMEN - 90-100% WBCS    Culture NO GROWTH 2 DAYS  Final   Report Status 02/07/2015 FINAL  Final    RADIOLOGY:  No results found.   Management plans discussed with the patient, family and they are in agreement.  CODE STATUS:     Code Status Orders        Start     Ordered   01/22/15 1642  Do not attempt resuscitation (DNR)   Continuous    Question Answer Comment  In the event of cardiac or respiratory ARREST Do not call a "code blue"   In the event of cardiac or respiratory ARREST Do not perform Intubation, CPR, defibrillation or ACLS   In the event of cardiac or respiratory ARREST Use medication by any route, position, wound care, and other measures to relive pain and suffering. May use oxygen, suction and manual treatment of airway obstruction as needed for comfort.      01/22/15 1641    Advance Directive Documentation        Most Recent Value   Type of Advance Directive  Healthcare Power of Attorney, Living will   Pre-existing out of facility DNR order (yellow form or pink MOST form)     "MOST" Form in Place?        TOTAL TIME TAKING CARE OF THIS PATIENT: 40 minutes.    Gerica Koble M.D on 02/11/2015 at 2:55 PM  Between 7am to 6pm - Pager - [phone] After 6pm go to [email or web address] - password [email or web address] [facility]  [facility]  Office  [phone]  CC: Primary care physician; [name], MD

## 2015-02-11 NOTE — Progress Notes (Signed)
HD tx start 

## 2015-02-12 ENCOUNTER — Telehealth: Payer: Self-pay | Admitting: *Deleted

## 2015-02-12 ENCOUNTER — Other Ambulatory Visit: Payer: Medicare HMO

## 2015-02-12 ENCOUNTER — Ambulatory Visit: Payer: Medicare HMO

## 2015-02-12 NOTE — Telephone Encounter (Signed)
Transitional care call attempted.  Left message for patient to return call. 

## 2015-02-13 NOTE — Telephone Encounter (Signed)
Transitional care call attempted.  Left message for patient to return call. 

## 2015-02-14 NOTE — Telephone Encounter (Signed)
Transitional care call attempted.  Left message for patient to return call. 

## 2015-02-17 NOTE — Telephone Encounter (Signed)
Transitional care call attempted.  Left message for patient to return call. 

## 2015-02-18 NOTE — Telephone Encounter (Signed)
Patient was discharged to SNF for rehab.  Will follow up once discharged to home.

## 2015-02-19 ENCOUNTER — Ambulatory Visit: Payer: Medicare HMO

## 2015-02-19 ENCOUNTER — Other Ambulatory Visit: Payer: Medicare HMO

## 2015-02-20 ENCOUNTER — Ambulatory Visit: Payer: Medicare HMO | Admitting: Family Medicine

## 2015-02-25 ENCOUNTER — Encounter: Payer: Self-pay | Admitting: Family Medicine

## 2015-02-25 ENCOUNTER — Ambulatory Visit (INDEPENDENT_AMBULATORY_CARE_PROVIDER_SITE_OTHER): Payer: Medicare HMO | Admitting: Family Medicine

## 2015-02-25 VITALS — BP 112/48 | HR 83 | Temp 98.0°F | Wt 137.8 lb

## 2015-02-25 DIAGNOSIS — H6980 Other specified disorders of Eustachian tube, unspecified ear: Secondary | ICD-10-CM | POA: Insufficient documentation

## 2015-02-25 DIAGNOSIS — H6982 Other specified disorders of Eustachian tube, left ear: Secondary | ICD-10-CM

## 2015-02-25 MED ORDER — FLUTICASONE PROPIONATE 50 MCG/ACT NA SUSP: 2.0000 | Freq: Every day | NASAL | Status: DC

## 2015-02-25 NOTE — Progress Notes (Signed)
Pre visit review using our clinic review tool, if applicable. No additional management support is needed unless otherwise documented below in the visit note. 

## 2015-02-25 NOTE — Progress Notes (Signed)
Subjective:   Patient ID: Brandi Cline, female    DOB: [birth year], 81 y.o.   MRN: [id number]  Brandi Cline is a pleasant 81 y.o. year old female who presents to clinic today with her sister for left Ear Pain  on 02/25/2015  HPI:  Left ear pain- Over past few days, feels like left ear is full, decreased hearing.  Has pressure, "not really pain." No ear discharge. No bleeding from ear.  No traumatic injury to ear.  Current Outpatient Prescriptions on File Prior to Visit  Medication Sig Dispense Refill  . albuterol (PROVENTIL HFA;VENTOLIN HFA) 108 (90 BASE) MCG/ACT inhaler Inhale 2 puffs into the lungs every 6 (six) hours as needed for wheezing or shortness of breath. 1 Inhaler 11  . albuterol (PROVENTIL) (2.5 MG/3ML) 0.083% nebulizer solution Take 3 mLs (2.5 mg total) by nebulization every 4 (four) hours as needed. Shortness of breath 75 mL 11  . ALPRAZolam (XANAX) 0.5 MG tablet TAKE ONE TABLET BY MOUTH THREE TIMES DAILY AS NEEDED FOR ANXIETY 90 tablet 0  . arformoterol (BROVANA) 15 MCG/2ML NEBU Take 2 mLs (15 mcg total) by nebulization 2 (two) times daily. 60 mL prn  . BIOTIN 5000 PO Take 2 tablets by mouth daily.    . Cholecalciferol (VITAMIN D-3) 5000 UNITS TABS Take 1 tablet by mouth daily.    . isosorbide mononitrate (IMDUR) 30 MG 24 hr tablet TAKE TWO TABLETS BY MOUTH DAILY 60 tablet 11  . loratadine (CLARITIN) 10 MG tablet Take 10 mg by mouth daily as needed.     . magnesium oxide (MAG-OX) 400 MG tablet Take 400 mg by mouth daily.     . Multiple Vitamin (MULTIVITAMIN) tablet Take 1 tablet by mouth daily.     . nitroGLYCERIN (NITROSTAT) 0.4 MG SL tablet Place 1 tablet (0.4 mg total) under the tongue every 5 (five) minutes as needed. Chest pain 25 tablet 3  . omeprazole (PRILOSEC) 20 MG capsule TAKE ONE CAPSULE BY MOUTH DAILY 90 capsule 3  . Zinc Acetate, Oral, (ZINC ACETATE PO) Take 1 tablet by mouth daily.     No current facility-administered medications on file prior to  visit.    Allergies  Allergen Reactions  . Avelox [Moxifloxacin Hcl In Nacl] Shortness Of Breath, Swelling and Rash  . Codeine Shortness Of Breath and Other (See Comments)    Couldn't breath  . Theophyllines     Slight increased heart rate  . Ciprofloxacin Other (See Comments)    nervous  . Milk-Related Compounds Other (See Comments)    Whole milk hurts stomach. Not all dairy products do this.  . Tape     Please use paper tape only  . Lisinopril Rash    Past Medical History  Diagnosis Date  . Dyspnea   . Esophageal reflux   . CAD (coronary artery disease)   . Hypertension   . Crohn's disease (HCC)   . Allergic rhinitis   . COPD (chronic obstructive pulmonary disease) (HCC)   . History of kidney stones   . History of shingles   . Trigeminal neuralgia   . Family history of anesthesia complication     " SISTER HAD BAD FEELINGS "  . Anginal pain (HCC)   . Anxiety     ' JUST FOR MY BREATHING "  . Arthritis     mild in hands  . Hyperlipidemia   . CKD (chronic kidney disease)     Past Surgical History  Procedure Laterality Date  .  Coronary stents    . Lithotripsy    . Ureteral stent placement    . Cholecystectomy    . Total abdominal hysterectomy    . Partial resections large and small bowel for crohn's    . Breast lumps benign    . Left heart catheterization with coronary angiogram N/A 10/28/2011    Procedure: LEFT HEART CATHETERIZATION WITH CORONARY ANGIOGRAM;  Surgeon: Lesleigh Noe, MD;  Location: [facility] CATH LAB;  Service: Cardiovascular;  Laterality: N/A;  . Peripheral vascular catheterization N/A 01/31/2015    Procedure: Dialysis/Perma Catheter Insertion;  Surgeon: Renford Dills, MD;  Location: [facility] INVASIVE CV LAB;  Service: Cardiovascular;  Laterality: N/A;  . Endobronchial ultrasound N/A 02/05/2015    Procedure: ENDOBRONCHIAL ULTRASOUND;  Surgeon: Stephanie Acre, MD;  Location: [facility] ORS;  Service: Cardiopulmonary;  Laterality: N/A;  .  Esophagogastroduodenoscopy (egd) with propofol N/A 02/03/2015    Procedure: ESOPHAGOGASTRODUODENOSCOPY (EGD) WITH PROPOFOL;  Surgeon: Midge Minium, MD;  Location: [facility] ENDOSCOPY;  Service: Endoscopy;  Laterality: N/A;  Look into the colon and stomach with a light to look for the source of bleeding.  . Colonoscopy with propofol N/A 02/03/2015    Procedure: COLONOSCOPY WITH PROPOFOL;  Surgeon: Midge Minium, MD;  Location: [facility] ENDOSCOPY;  Service: Endoscopy;  Laterality: N/A;    Family History  Problem Relation Age of Onset  . Breast cancer Sister   . COPD Sister   . Stroke Mother   . Prostate cancer Father   . Prostate cancer Brother   . Heart attack Brother     Social History   Social History  . Marital Status: Widowed    Spouse Name: N/A  . Number of Children: N/A  . Years of Education: N/A   Occupational History  . PT [employer]    Social History Main Topics  . Smoking status: Former Smoker -- 1.00 packs/day for 30 years    Types: Cigarettes    Quit date: 04/12/1990  . Smokeless tobacco: Never Used  . Alcohol Use: No  . Drug Use: No  . Sexual Activity: No   Other Topics Concern  . Not on file   Social History Narrative   Has a living will and HPOA- Rosamond Deveaux.   Would desire CPR.   Would not want prolonged life support, would not want feeding tubes.         The PMH, PSH, Social History, Family History, Medications, and allergies have been reviewed in [facility], and have been updated if relevant.   Review of Systems  Constitutional: Negative.   HENT: Positive for congestion, ear pain and hearing loss. Negative for dental problem, drooling, ear discharge, facial swelling, mouth sores, nosebleeds, postnasal drip, rhinorrhea, sinus pressure, sneezing, sore throat, tinnitus, trouble swallowing and voice change.   Respiratory: Negative.   Cardiovascular: Negative.   All other systems reviewed and are negative.      Objective:    BP 112/48 mmHg  Pulse 83   Temp(Src) 98 F (36.7 C) (Oral)  Wt 137 lb 12 oz (62.483 kg)  SpO2 96%   Physical Exam  Constitutional: She is oriented to person, place, and time. She appears well-developed and well-nourished. No distress.  O2 per [place]  HENT:  Right Ear: Hearing, tympanic membrane, external ear and ear canal normal.  Left Ear: No drainage or swelling. No mastoid tenderness. Tympanic membrane is not perforated, not erythematous, not retracted and not bulging. Tympanic membrane mobility is normal. A middle ear effusion is  present. No hemotympanum.  Eyes: Conjunctivae are normal.  Pulmonary/Chest: Effort normal.  Musculoskeletal: Normal range of motion.  Neurological: She is alert and oriented to person, place, and time. No cranial nerve deficit.  Skin: Skin is warm and dry. She is not diaphoretic.  Psychiatric: She has a normal mood and affect. Her behavior is normal. Thought content normal.  Nursing note and vitals reviewed.         Assessment & Plan:   ETD (eustachian tube dysfunction), left No Follow-up on file.

## 2015-02-25 NOTE — Patient Instructions (Signed)

## 2015-02-25 NOTE — Assessment & Plan Note (Signed)
New- no cerumen or signs of infection. Complicated medical history- would like to start with inhaled steroid first. The patient indicates understanding of these issues and agrees with the plan. eRx sent for flonase. Supportive care as per AVS. Call or return to clinic prn if these symptoms worsen or fail to improve as anticipated. The patient indicates understanding of these issues and agrees with the plan.

## 2015-03-04 ENCOUNTER — Other Ambulatory Visit: Payer: Self-pay | Admitting: Vascular Surgery

## 2015-03-05 ENCOUNTER — Encounter: Payer: Self-pay | Admitting: Family Medicine

## 2015-03-05 ENCOUNTER — Ambulatory Visit (INDEPENDENT_AMBULATORY_CARE_PROVIDER_SITE_OTHER): Payer: Medicare HMO | Admitting: Family Medicine

## 2015-03-05 ENCOUNTER — Ambulatory Visit: Payer: Medicare HMO

## 2015-03-05 ENCOUNTER — Ambulatory Visit: Payer: Medicare HMO | Admitting: Oncology

## 2015-03-05 ENCOUNTER — Other Ambulatory Visit: Payer: Medicare HMO

## 2015-03-05 VITALS — BP 128/70 | HR 76 | Temp 98.2°F | Wt 134.0 lb

## 2015-03-05 DIAGNOSIS — H6982 Other specified disorders of Eustachian tube, left ear: Secondary | ICD-10-CM | POA: Diagnosis not present

## 2015-03-05 MED ORDER — FLUTICASONE PROPIONATE 50 MCG/ACT NA SUSP: 2.0000 | Freq: Two times a day (BID) | NASAL | Status: DC

## 2015-03-05 NOTE — Progress Notes (Signed)
Pre visit review using our clinic review tool, if applicable. No additional management support is needed unless otherwise documented below in the visit note.  ESRD, s/p HD today.  She is going to have fistula placed in about 10 days.  She has port on R chest wall now.    Roaring in R ear.  Popping and then she can't hear well.  No pain.  Started about 21 days ago.  Occ better with cough or swallowing.   S/p flonase (2 sprays per nostril BID) and nasal saline, still on augmentin for L leg cellulitis.    Meds, vitals, and allergies reviewed.   ROS: See HPI.  Otherwise, noncontributory.  On O2 at office, at baseline.   nad ncat Tm wnl, no erythema Nasal exam stuffy OP wnl Neck supple, no LA rrr L lower leg with small pinkish area, resolving cellulitis per patient report.  1+ BLE edema

## 2015-03-05 NOTE — Assessment & Plan Note (Signed)
Anatomy d/w pt. Likely ETD.  Continue flonase, continue valsava.  If not better and cellulitis resolves, then consider pred course.  She'll update me as needed.  She agrees.

## 2015-03-05 NOTE — Patient Instructions (Signed)
Keep using flonase twice a day and try to pop your ears.  If your still have trouble with your ear but the leg infection is resolved, then let me know Monday.   We may need to put you on a short run of prednisone.  Take care.  Glad to see you.

## 2015-03-10 ENCOUNTER — Encounter
Admission: RE | Admit: 2015-03-10 | Discharge: 2015-03-10 | Disposition: A | Discharge status: Home / Self Care | Payer: Medicare HMO | Source: Ambulatory Visit | Attending: Vascular Surgery | Admitting: Vascular Surgery

## 2015-03-10 DIAGNOSIS — Z01818 Encounter for other preprocedural examination: Secondary | ICD-10-CM

## 2015-03-10 NOTE — Pre-Procedure Instructions (Signed)
Left leg swollen, red, with blisters.  Just completed Augmentin yesterday.  No improvement per daughter.  Dr Marijean Heath office notified.  Patient instructed to have primary physician clear the cellulitis prior to surgery.

## 2015-03-11 ENCOUNTER — Telehealth: Payer: Self-pay | Admitting: Oncology

## 2015-03-11 NOTE — Telephone Encounter (Signed)
Returned call to dtr Brandi Cline re cx 11/30 appointments. Per Brandi Cline patient has spent some time in hosp and is now receiving the injection at dialysis. Per dtr she will talk with other doctors to see where they are and if patient needs to r/s appointment with FS. Per dtr will call back to r/s after talking with other doctors if needed.

## 2015-03-12 ENCOUNTER — Ambulatory Visit: Payer: Medicare HMO | Admitting: Oncology

## 2015-03-12 ENCOUNTER — Ambulatory Visit: Payer: Medicare HMO

## 2015-03-12 ENCOUNTER — Other Ambulatory Visit: Payer: Medicare HMO

## 2015-03-12 LAB — SURGICAL PATHOLOGY

## 2015-03-18 ENCOUNTER — Other Ambulatory Visit: Payer: Medicare HMO

## 2015-03-21 ENCOUNTER — Encounter
Admission: RE | Admit: 2015-03-21 | Discharge: 2015-03-21 | Disposition: A | Discharge status: Home / Self Care | Payer: Medicare HMO | Source: Ambulatory Visit | Attending: Vascular Surgery | Admitting: Vascular Surgery

## 2015-03-21 ENCOUNTER — Ambulatory Visit
Admission: RE | Admit: 2015-03-21 | Discharge: 2015-03-21 | Disposition: A | Discharge status: Home / Self Care | Payer: Medicare HMO | Source: Ambulatory Visit | Attending: Vascular Surgery | Admitting: Vascular Surgery

## 2015-03-21 ENCOUNTER — Other Ambulatory Visit: Payer: Self-pay | Admitting: Family Medicine

## 2015-03-21 DIAGNOSIS — Z09 Encounter for follow-up examination after completed treatment for conditions other than malignant neoplasm: Secondary | ICD-10-CM | POA: Insufficient documentation

## 2015-03-21 DIAGNOSIS — X58XXXA Exposure to other specified factors, initial encounter: Secondary | ICD-10-CM | POA: Insufficient documentation

## 2015-03-21 DIAGNOSIS — R918 Other nonspecific abnormal finding of lung field: Secondary | ICD-10-CM | POA: Insufficient documentation

## 2015-03-21 DIAGNOSIS — M4854XD Collapsed vertebra, not elsewhere classified, thoracic region, subsequent encounter for fracture with routine healing: Secondary | ICD-10-CM | POA: Insufficient documentation

## 2015-03-21 DIAGNOSIS — I770 Arteriovenous fistula, acquired: Secondary | ICD-10-CM

## 2015-03-21 HISTORY — DX: Calculus of kidney: N20.0

## 2015-03-21 LAB — TYPE AND SCREEN
ABO/RH(D): A POS
Antibody Screen: NEGATIVE
EXTEND SAMPLE REASON: TRANSFUSED

## 2015-03-21 LAB — SURGICAL PCR SCREEN
MRSA, PCR: NEGATIVE
STAPHYLOCOCCUS AUREUS: NEGATIVE

## 2015-03-21 LAB — CBC
HEMATOCRIT: 31.1 % — AB (ref 35.0–47.0)
HEMOGLOBIN: 9.6 g/dL — AB (ref 12.0–16.0)
MCH: 26.6 pg (ref 26.0–34.0)
MCHC: 30.9 g/dL — AB (ref 32.0–36.0)
MCV: 86.1 fL (ref 80.0–100.0)
Platelets: 129 10*3/uL — ABNORMAL LOW (ref 150–440)
RBC: 3.62 MIL/uL — AB (ref 3.80–5.20)
RDW: 19 % — ABNORMAL HIGH (ref 11.5–14.5)
WBC: 4.6 10*3/uL (ref 3.6–11.0)

## 2015-03-21 LAB — PROTIME-INR
INR: 1.6
Prothrombin Time: 19.1 seconds — ABNORMAL HIGH (ref 11.4–15.0)

## 2015-03-21 LAB — DIFFERENTIAL
Basophils Absolute: 0 10*3/uL (ref 0–0.1)
Basophils Relative: 1 %
EOS ABS: 0 10*3/uL (ref 0–0.7)
EOS PCT: 1 %
LYMPHS ABS: 0.7 10*3/uL — AB (ref 1.0–3.6)
LYMPHS PCT: 15 %
MONO ABS: 0.4 10*3/uL (ref 0.2–0.9)
MONOS PCT: 9 %
NEUTROS PCT: 74 %
Neutro Abs: 3.4 10*3/uL (ref 1.4–6.5)

## 2015-03-21 LAB — BASIC METABOLIC PANEL
ANION GAP: 13 (ref 5–15)
BUN: 21 mg/dL — ABNORMAL HIGH (ref 6–20)
CHLORIDE: 100 mmol/L — AB (ref 101–111)
CO2: 27 mmol/L (ref 22–32)
Calcium: 8.5 mg/dL — ABNORMAL LOW (ref 8.9–10.3)
Creatinine, Ser: 4.93 mg/dL — ABNORMAL HIGH (ref 0.44–1.00)
GFR calc non Af Amer: 7 mL/min — ABNORMAL LOW (ref >60)
GFR, EST AFRICAN AMERICAN: 9 mL/min — AB (ref >60)
Glucose, Bld: 116 mg/dL — ABNORMAL HIGH (ref 65–99)
POTASSIUM: 3.4 mmol/L — AB (ref 3.5–5.1)
SODIUM: 140 mmol/L (ref 135–145)

## 2015-03-21 LAB — APTT: aPTT: 39 seconds — ABNORMAL HIGH (ref 24–36)

## 2015-03-21 NOTE — Patient Instructions (Signed)
  Your procedure is scheduled on: Friday 03/28/2015 Report to Day Surgery. 2ND FLOOR MEDICAL MALL ENTRANCE To find out your arrival time please call [phone] between 1PM - 3PM on Thursday 03/27/2015.  Remember: Instructions that are not followed completely may result in serious medical risk, up to and including death, or upon the discretion of your surgeon and anesthesiologist your surgery may need to be rescheduled.    __X__ 1. Do not eat food or drink liquids after midnight. No gum chewing or hard candies.     __X__ 2. No Alcohol for 24 hours before or after surgery.   ____ 3. Bring all medications with you on the day of surgery if instructed.    __X__ 4. Notify your doctor if there is any change in your medical condition     (cold, fever, infections).     Do not wear jewelry, make-up, hairpins, clips or nail polish.  Do not wear lotions, powders, or perfumes.   Do not shave 48 hours prior to surgery. Men may shave face and neck.  Do not bring valuables to the hospital.    [facility] is not responsible for any belongings or valuables.               Contacts, dentures or bridgework may not be worn into surgery.  Leave your suitcase in the car. After surgery it may be brought to your room.  For patients admitted to the hospital, discharge time is determined by your                treatment team.   Patients discharged the day of surgery will not be allowed to drive home.   Please read over the following fact sheets that you were given:   MRSA Information and Surgical Site Infection Prevention   __X__ Take these medicines the morning of surgery with A SIP OF WATER:    1. ALPRAZOLAM IF NEEDED  2. ISOSORBIDE  3. MAGNESIUM  4. OMEPRAZOLE  5.  6.  ____ Fleet Enema (as directed)   _X___ Use CHG Soap as directed SAGE WIPES  __X__ Use inhalers on the day of surgery  AND YOUR NEBULIZER BEFORE ARRIVAL  ____ Stop metformin 2 days prior to surgery    ____ Take 1/2 of usual  insulin dose the night before surgery and none on the morning of surgery.   ____ Stop Coumadin/Plavix/aspirin on   ____ Stop Anti-inflammatories on    ____ Stop supplements until after surgery.    ____ Bring C-Pap to the hospital.

## 2015-03-24 NOTE — Telephone Encounter (Signed)
Rx called in to requested pharmacy 

## 2015-03-24 NOTE — Pre-Procedure Instructions (Signed)
Lab results given to [employer] fundraiser with anesthesia for review.

## 2015-03-24 NOTE — Telephone Encounter (Signed)
Last f/u 06/2014-CPE 

## 2015-03-25 ENCOUNTER — Emergency Department
Admission: EM | Admit: 2015-03-25 | Discharge: 2015-03-25 | Disposition: A | Discharge status: Home / Self Care | Payer: Medicare HMO | Attending: Emergency Medicine | Admitting: Emergency Medicine

## 2015-03-25 ENCOUNTER — Encounter: Payer: Self-pay | Admitting: Nephrology

## 2015-03-25 ENCOUNTER — Emergency Department: Payer: Medicare HMO

## 2015-03-25 ENCOUNTER — Encounter: Payer: Self-pay | Admitting: Emergency Medicine

## 2015-03-25 DIAGNOSIS — Z87891 Personal history of nicotine dependence: Secondary | ICD-10-CM | POA: Insufficient documentation

## 2015-03-25 DIAGNOSIS — R079 Chest pain, unspecified: Secondary | ICD-10-CM | POA: Diagnosis present

## 2015-03-25 DIAGNOSIS — Z7951 Long term (current) use of inhaled steroids: Secondary | ICD-10-CM | POA: Insufficient documentation

## 2015-03-25 DIAGNOSIS — Z792 Long term (current) use of antibiotics: Secondary | ICD-10-CM | POA: Diagnosis not present

## 2015-03-25 DIAGNOSIS — I129 Hypertensive chronic kidney disease with stage 1 through stage 4 chronic kidney disease, or unspecified chronic kidney disease: Secondary | ICD-10-CM | POA: Insufficient documentation

## 2015-03-25 DIAGNOSIS — N182 Chronic kidney disease, stage 2 (mild): Secondary | ICD-10-CM | POA: Insufficient documentation

## 2015-03-25 DIAGNOSIS — Z79899 Other long term (current) drug therapy: Secondary | ICD-10-CM | POA: Insufficient documentation

## 2015-03-25 LAB — COMPREHENSIVE METABOLIC PANEL
ALT: 29 U/L (ref 14–54)
ANION GAP: 14 (ref 5–15)
AST: 35 U/L (ref 15–41)
Albumin: 2.6 g/dL — ABNORMAL LOW (ref 3.5–5.0)
Alkaline Phosphatase: 265 U/L — ABNORMAL HIGH (ref 38–126)
BUN: 18 mg/dL (ref 6–20)
CHLORIDE: 96 mmol/L — AB (ref 101–111)
CO2: 27 mmol/L (ref 22–32)
CREATININE: 3.72 mg/dL — AB (ref 0.44–1.00)
Calcium: 7.5 mg/dL — ABNORMAL LOW (ref 8.9–10.3)
GFR, EST AFRICAN AMERICAN: 12 mL/min — AB (ref >60)
GFR, EST NON AFRICAN AMERICAN: 10 mL/min — AB (ref >60)
Glucose, Bld: 113 mg/dL — ABNORMAL HIGH (ref 65–99)
POTASSIUM: 2.7 mmol/L — AB (ref 3.5–5.1)
SODIUM: 137 mmol/L (ref 135–145)
Total Bilirubin: 1.5 mg/dL — ABNORMAL HIGH (ref 0.3–1.2)
Total Protein: 6 g/dL — ABNORMAL LOW (ref 6.5–8.1)

## 2015-03-25 LAB — CBC WITH DIFFERENTIAL/PLATELET
BASOS ABS: 0 10*3/uL (ref 0–0.1)
BASOS PCT: 0 %
EOS ABS: 0.1 10*3/uL (ref 0–0.7)
EOS PCT: 1 %
HCT: 29.2 % — ABNORMAL LOW (ref 35.0–47.0)
HEMOGLOBIN: 9.4 g/dL — AB (ref 12.0–16.0)
LYMPHS ABS: 0.6 10*3/uL — AB (ref 1.0–3.6)
Lymphocytes Relative: 9 %
MCH: 27.6 pg (ref 26.0–34.0)
MCHC: 32.3 g/dL (ref 32.0–36.0)
MCV: 85.3 fL (ref 80.0–100.0)
Monocytes Absolute: 0.4 10*3/uL (ref 0.2–0.9)
Monocytes Relative: 6 %
NEUTROS PCT: 84 %
Neutro Abs: 5.2 10*3/uL (ref 1.4–6.5)
PLATELETS: 118 10*3/uL — AB (ref 150–440)
RBC: 3.42 MIL/uL — AB (ref 3.80–5.20)
RDW: 19.2 % — ABNORMAL HIGH (ref 11.5–14.5)
WBC: 6.3 10*3/uL (ref 3.6–11.0)

## 2015-03-25 LAB — TROPONIN I: TROPONIN I: 0.03 ng/mL (ref <0.031)

## 2015-03-25 NOTE — Discharge Instructions (Signed)
You have been seen in the emergency department today for chest pain. Your workup has shown normal results. As we discussed please follow-up with your primary care physician in the next 1-2 days for recheck. Return to the emergency department for any further chest pain, trouble breathing, or any other symptom personally concerning to yourself. ° °Nonspecific Chest Pain °It is often hard to find the cause of chest pain. There is always a chance that your pain could be related to something serious, such as a heart attack or a blood clot in your lungs. Chest pain can also be caused by conditions that are not life-threatening. If you have chest pain, it is very important to follow up with your doctor. ° °HOME CARE °· If you were prescribed an antibiotic medicine, finish it all even if you start to feel better. °· Avoid any activities that cause chest pain. °· Do not use any tobacco products, including cigarettes, chewing tobacco, or electronic cigarettes. If you need help quitting, ask your doctor. °· Do not drink alcohol. °· Take medicines only as told by your doctor. °· Keep all follow-up visits as told by your doctor. This is important. This includes any further testing if your chest pain does not go away. °· Your doctor may tell you to keep your head raised (elevated) while you sleep. °· Make lifestyle changes as told by your doctor. These may include: °¨ Getting regular exercise. Ask your doctor to suggest some activities that are safe for you. °¨ Eating a heart-healthy diet. Your doctor or a diet specialist (dietitian) can help you to learn healthy eating options. °¨ Maintaining a healthy weight. °¨ Managing diabetes, if necessary. °¨ Reducing stress. °GET HELP IF: °· Your chest pain does not go away, even after treatment. °· You have a rash with blisters on your chest. °· You have a fever. °GET HELP RIGHT AWAY IF: °· Your chest pain is worse. °· You have an increasing cough, or you cough up blood. °· You have  severe belly (abdominal) pain. °· You feel extremely weak. °· You pass out (faint). °· You have chills. °· You have sudden, unexplained chest discomfort. °· You have sudden, unexplained discomfort in your arms, back, neck, or jaw. °· You have shortness of breath at any time. °· You suddenly start to sweat, or your skin gets clammy. °· You feel nauseous. °· You vomit. °· You suddenly feel light-headed or dizzy. °· Your heart begins to beat quickly, or it feels like it is skipping beats. °These symptoms may be an emergency. Do not wait to see if the symptoms will go away. Get medical help right away. Call your local emergency services (911 in the [place] Do not drive yourself to the hospital. °  °This information is not intended to replace advice given to you by your health care provider. Make sure you discuss any questions you have with your health care provider. °  °Document Released: 09/15/2007 Document Revised: 04/19/2014 Document Reviewed: 11/02/2013 °Elsevier Interactive Patient Education ©2016 [employer] ° °

## 2015-03-25 NOTE — ED Provider Notes (Signed)
[facility] Emergency Department Provider Note  Time seen: 2:36 PM  I have reviewed the triage vital signs and the nursing notes.   HISTORY  Chief Complaint Chest Pain    HPI Brandi Cline is a 81 y.o. female with a past medical history of hypertension, Crohn's, CK D now on dialysis 2 months, who presents the emergency department with chest pain. According to the patient she was receiving her dialysis session when her blood pressure dropped to approximate 60 systolic. She began having chest tightness. Her dialysis was stopped, and she was given saline and her blood pressure returned to normal. Patient states immediately she felt better, chest pain was completely resolved and she was ready to continue her dialysis session but the nurse giving her the dialysis provided be best if she got checked. The patient came to the emergency department via EMS. At this time the patient has no complaints, denies chest tightness, chest pain, trouble breathing, weakness or dizziness. Patient states her chest tightness was mild at maximum, gone currently.     Past Medical History  Diagnosis Date  . Dyspnea   . Esophageal reflux   . CAD (coronary artery disease)   . Hypertension   . Crohn's disease (HCC)   . Allergic rhinitis   . COPD (chronic obstructive pulmonary disease) (HCC)   . History of kidney stones   . History of shingles   . Trigeminal neuralgia   . Family history of anesthesia complication     " SISTER HAD BAD FEELINGS "  . Anginal pain (HCC)   . Anxiety     ' JUST FOR MY BREATHING "  . Arthritis     mild in hands  . Hyperlipidemia   . CKD (chronic kidney disease)   . Kidney stones     Patient Active Problem List   Diagnosis Date Noted  . ETD (eustachian tube dysfunction) 02/25/2015  . Abnormal chest CT   . Iron deficiency anemia, unspecified   . Heme + stool   . Intestinal bypass or anastomosis status   . Acute on chronic renal failure (HCC)  01/22/2015  . Cellulitis of leg, left 01/22/2015  . Acute renal failure (HCC) 01/10/2015  . Leg edema 10/21/2014  . Anemia 10/21/2014  . Abdominal pressure 10/21/2014  . Elevated serum creatinine 10/05/2014  . Thrombocytopenia (HCC) 10/03/2014  . Cystitis 09/07/2014  . Chronic respiratory failure with hypoxia (HCC) 09/03/2014  . Hyperglycemia 06/27/2014  . Elevated liver function tests 06/27/2014  . History of nephrolithiasis 01/10/2014  . Splenic varices 10/25/2013  . Cirrhosis of liver (HCC) 10/03/2013  . CKD (chronic kidney disease), stage II 09/20/2013  . Post herpetic neuralgia 06/13/2013  . Chronic diastolic congestive heart failure, NYHA class 1 (HCC) 04/25/2013  . Essential hypertension 03/21/2007  . Coronary atherosclerosis 03/21/2007  . ALLERGIC RHINITIS 03/21/2007  . COPD with emphysema (HCC) 03/21/2007  . Esophageal reflux 03/21/2007  . Regional enteritis [facility]) 03/21/2007    Past Surgical History  Procedure Laterality Date  . Coronary stents    . Lithotripsy    . Ureteral stent placement    . Cholecystectomy    . Total abdominal hysterectomy    . Partial resections large and small bowel for crohn's    . Breast lumps benign    . Left heart catheterization with coronary angiogram N/A 10/28/2011    Procedure: LEFT HEART CATHETERIZATION WITH CORONARY ANGIOGRAM;  Surgeon: Lesleigh Noe, MD;  Location: [facility] CATH LAB;  Service:  Cardiovascular;  Laterality: N/A;  . Peripheral vascular catheterization N/A 01/31/2015    Procedure: Dialysis/Perma Catheter Insertion;  Surgeon: Renford Dills, MD;  Location: [facility] INVASIVE CV LAB;  Service: Cardiovascular;  Laterality: N/A;  . Endobronchial ultrasound N/A 02/05/2015    Procedure: ENDOBRONCHIAL ULTRASOUND;  Surgeon: Stephanie Acre, MD;  Location: [facility] ORS;  Service: Cardiopulmonary;  Laterality: N/A;  . Esophagogastroduodenoscopy (egd) with propofol N/A 02/03/2015    Procedure: ESOPHAGOGASTRODUODENOSCOPY (EGD) WITH PROPOFOL;   Surgeon: Midge Minium, MD;  Location: [facility] ENDOSCOPY;  Service: Endoscopy;  Laterality: N/A;  Look into the colon and stomach with a light to look for the source of bleeding.  . Colonoscopy with propofol N/A 02/03/2015    Procedure: COLONOSCOPY WITH PROPOFOL;  Surgeon: Midge Minium, MD;  Location: [facility] ENDOSCOPY;  Service: Endoscopy;  Laterality: N/A;    Current Outpatient Rx  Name  Route  Sig  Dispense  Refill  . albuterol (PROVENTIL HFA;VENTOLIN HFA) 108 (90 BASE) MCG/ACT inhaler   Inhalation   Inhale 2 puffs into the lungs every 6 (six) hours as needed for wheezing or shortness of breath.   1 Inhaler   11     Ventolin HFA   . albuterol (PROVENTIL) (2.5 MG/3ML) 0.083% nebulizer solution   Nebulization   Take 3 mLs (2.5 mg total) by nebulization every 4 (four) hours as needed. Shortness of breath   75 mL   11   . ALPRAZolam (XANAX) 0.5 MG tablet      TAKE ONE TABLET BY MOUTH THREE TIMES DAILY AS NEEDED   90 tablet   0   . amoxicillin-clavulanate (AUGMENTIN) 500-125 MG tablet   Oral   Take 1 tablet by mouth 3 (three) times daily.         [name] arformoterol (BROVANA) 15 MCG/2ML NEBU   Nebulization   Take 2 mLs (15 mcg total) by nebulization 2 (two) times daily.   60 mL   prn   . BIOTIN 5000 PO   Oral   Take 2 tablets by mouth daily.         . Cholecalciferol (VITAMIN D-3) 5000 UNITS TABS   Oral   Take 1 tablet by mouth daily.         . fluticasone (FLONASE) 50 MCG/ACT nasal spray   Each Nare   Place 2 sprays into both nostrils 2 (two) times daily.         . isosorbide mononitrate (IMDUR) 30 MG 24 hr tablet      TAKE TWO TABLETS BY MOUTH DAILY   60 tablet   11   . loratadine (CLARITIN) 10 MG tablet   Oral   Take 10 mg by mouth daily as needed.          . magnesium oxide (MAG-OX) 400 MG tablet   Oral   Take 400 mg by mouth daily.          . Multiple Vitamin (MULTIVITAMIN) tablet   Oral   Take 1 tablet by mouth daily.          [name] NASAL SPRAY  SALINE NA   Nasal   Place into the nose as needed.         . nitroGLYCERIN (NITROSTAT) 0.4 MG SL tablet   Sublingual   Place 1 tablet (0.4 mg total) under the tongue every 5 (five) minutes as needed. Chest pain   25 tablet   3   . omeprazole (PRILOSEC) 20 MG capsule      TAKE ONE  CAPSULE BY MOUTH DAILY   90 capsule   3   . Zinc Acetate, Oral, (ZINC ACETATE PO)   Oral   Take 1 tablet by mouth daily.           Allergies Avelox; Codeine; Theophyllines; Ciprofloxacin; Milk-related compounds; Hydrocodone; Tape; and Lisinopril  Family History  Problem Relation Age of Onset  . Breast cancer Sister   . COPD Sister   . Stroke Mother   . Prostate cancer Father   . Prostate cancer Brother   . Heart attack Brother     Social History Social History  Substance Use Topics  . Smoking status: Former Smoker -- 1.00 packs/day for 30 years    Types: Cigarettes    Quit date: 04/12/1990  . Smokeless tobacco: Never Used  . Alcohol Use: No    Review of Systems Constitutional: Negative for fever. Cardiovascular: Positive for chest tightness now resolved. Respiratory: Negative for shortness of breath. Gastrointestinal: Negative for abdominal pain Musculoskeletal: Negative for back pain Neurological: Negative for headache 10-point ROS otherwise negative.  ____________________________________________   PHYSICAL EXAM:  VITAL SIGNS: ED Triage Vitals  Enc Vitals Group     BP 03/25/15 1403 124/47 mmHg     Pulse Rate 03/25/15 1358 75     Resp 03/25/15 1358 22     Temp --      Temp src --      SpO2 03/25/15 1403 93 %     Weight 03/25/15 1358 139 lb (63.05 kg)     Height 03/25/15 1358 5\' 3"  (1.6 m)     Head Cir --      Peak Flow --      Pain Score 03/25/15 1400 1     Pain Loc --      Pain Edu? --      Excl. in GC? --     Constitutional: Alert and oriented. Well appearing and in no distress. Eyes: Normal exam ENT   Head: Normocephalic and atraumatic    Mouth/Throat: Mucous membranes are moist. Cardiovascular: Normal rate, regular rhythm. No murmur Respiratory: Normal respiratory effort without tachypnea nor retractions. Breath sounds are clear. Dialysis catheter present to right subclavian. Gastrointestinal: Soft and nontender. No distention.   Musculoskeletal: Nontender with normal range of motion in all extremities. Neurologic:  Normal speech and language. No gross focal neurologic deficits  Skin:  Skin is warm, dry and intact.  Psychiatric: Mood and affect are normal. Speech and behavior are normal.   ____________________________________________    EKG  EKG reviewed and interpreted by myself shows normal sinus rhythm at 76 bpm, narrow QRS, normal axis, normal intervals, nonspecific ST changes. No ST elevations.  ____________________________________________    RADIOLOGY  Chest x-ray negative  ____________________________________________   INITIAL IMPRESSION / ASSESSMENT AND PLAN / ED COURSE  Pertinent labs & imaging results that were available during my care of the patient were reviewed by me and considered in my medical decision making (see chart for details).  Patient presents with onset of chest tightness during dialysis when her blood pressure dropped. Dialysis was stopped her blood pressure returned to normal and her chest tightness resolved. Denies any chest tightness currently. Patient appears very well currently, EKG is reassuring. We'll check labs, chest x-ray monitor in the emergency department.  Labs are largely the patient's baseline. Troponin is negative. X-rays negative. Patient continues to feel well. We will repeat a troponin at 5 PM, if the repeat troponin remains within normal limits, the patient will be  discharged home with primary care follow-up. Patient is agreeable to this plan. Patient care signed out to Dr. Langston Masker.  ____________________________________________   FINAL CLINICAL IMPRESSION(S) / ED  DIAGNOSES  Chest pain   Minna Antis, MD 03/25/15 [phone]

## 2015-03-25 NOTE — ED Notes (Signed)
Was at dialysis , felt her blood pressure drop with the occurrence of mid sternal chest pain/tightness.

## 2015-03-25 NOTE — ED Provider Notes (Signed)
Signout from Dr. Lenard Lance on this 80 year old female who had chest pain with hypotension at dialysis who is now chest pain-free after returned to a normal blood pressure. Already had 1 normal troponin and is now pending her second troponin. The plan will be discharged home if the patient still is chest pain-free with reassuring vital signs and repeat labs. Physical Exam  BP 122/54 mmHg  Pulse 74  Resp 18  Ht 5\' 3"  (1.6 m)  Wt 139 lb (63.05 kg)  BMI 24.63 kg/m2  SpO2 99% ----------------------------------------- 6:15 PM on 03/25/2015 -----------------------------------------   Physical Exam He is responding with this time. ED Course  Procedures  MDM Patient denies any pain on reexamination. We'll discharge to home. Second troponin is undetectable.      Myrna Blazer, MD 03/25/15 [phone]

## 2015-03-28 ENCOUNTER — Encounter
Admission: RE | Disposition: A | Discharge status: Home / Self Care | Payer: Self-pay | Source: Ambulatory Visit | Attending: Vascular Surgery

## 2015-03-28 ENCOUNTER — Ambulatory Visit: Payer: Medicare HMO | Admitting: Anesthesiology

## 2015-03-28 ENCOUNTER — Ambulatory Visit
Admission: RE | Admit: 2015-03-28 | Discharge: 2015-03-28 | Disposition: A | Discharge status: Home / Self Care | Payer: Medicare HMO | Source: Ambulatory Visit | Attending: Vascular Surgery | Admitting: Vascular Surgery

## 2015-03-28 DIAGNOSIS — Z8249 Family history of ischemic heart disease and other diseases of the circulatory system: Secondary | ICD-10-CM | POA: Diagnosis not present

## 2015-03-28 DIAGNOSIS — Z9889 Other specified postprocedural states: Secondary | ICD-10-CM | POA: Insufficient documentation

## 2015-03-28 DIAGNOSIS — Z90710 Acquired absence of both cervix and uterus: Secondary | ICD-10-CM | POA: Insufficient documentation

## 2015-03-28 DIAGNOSIS — J449 Chronic obstructive pulmonary disease, unspecified: Secondary | ICD-10-CM | POA: Insufficient documentation

## 2015-03-28 DIAGNOSIS — K219 Gastro-esophageal reflux disease without esophagitis: Secondary | ICD-10-CM | POA: Diagnosis not present

## 2015-03-28 DIAGNOSIS — I509 Heart failure, unspecified: Secondary | ICD-10-CM | POA: Diagnosis not present

## 2015-03-28 DIAGNOSIS — K5090 Crohn's disease, unspecified, without complications: Secondary | ICD-10-CM | POA: Diagnosis not present

## 2015-03-28 DIAGNOSIS — Z9981 Dependence on supplemental oxygen: Secondary | ICD-10-CM | POA: Diagnosis not present

## 2015-03-28 DIAGNOSIS — I132 Hypertensive heart and chronic kidney disease with heart failure and with stage 5 chronic kidney disease, or end stage renal disease: Secondary | ICD-10-CM | POA: Insufficient documentation

## 2015-03-28 DIAGNOSIS — Z87891 Personal history of nicotine dependence: Secondary | ICD-10-CM | POA: Insufficient documentation

## 2015-03-28 DIAGNOSIS — I998 Other disorder of circulatory system: Secondary | ICD-10-CM | POA: Diagnosis not present

## 2015-03-28 DIAGNOSIS — M1991 Primary osteoarthritis, unspecified site: Secondary | ICD-10-CM | POA: Diagnosis not present

## 2015-03-28 DIAGNOSIS — N186 End stage renal disease: Secondary | ICD-10-CM | POA: Diagnosis not present

## 2015-03-28 DIAGNOSIS — Z883 Allergy status to other anti-infective agents status: Secondary | ICD-10-CM | POA: Insufficient documentation

## 2015-03-28 DIAGNOSIS — Z9049 Acquired absence of other specified parts of digestive tract: Secondary | ICD-10-CM | POA: Diagnosis not present

## 2015-03-28 HISTORY — PX: AV FISTULA PLACEMENT: SHX1204

## 2015-03-28 LAB — POTASSIUM: Potassium: 3.3 mmol/L — ABNORMAL LOW (ref 3.5–5.1)

## 2015-03-28 SURGERY — INSERTION OF ARTERIOVENOUS (AV) GORE-TEX GRAFT ARM
Anesthesia: General | Laterality: Left | Wound class: Clean

## 2015-03-28 MED ORDER — BUPIVACAINE HCL (PF) 0.5 % IJ SOLN
INTRAMUSCULAR | Status: AC
  Filled 2015-03-28: qty 30

## 2015-03-28 MED ORDER — OXYCODONE-ACETAMINOPHEN 5-325 MG PO TABS: 1.0000 | ORAL_TABLET | ORAL | Status: DC | PRN

## 2015-03-28 MED ORDER — LIDOCAINE HCL (CARDIAC) 20 MG/ML IV SOLN
INTRAVENOUS | Status: DC | PRN
  Administered 2015-03-28: 100 mg via INTRAVENOUS

## 2015-03-28 MED ORDER — FENTANYL CITRATE (PF) 100 MCG/2ML IJ SOLN: 25.0000 ug | INTRAMUSCULAR | Status: DC | PRN

## 2015-03-28 MED ORDER — LACTATED RINGERS IV SOLN
INTRAVENOUS | Status: DC | PRN
  Administered 2015-03-28: 08:00:00 via INTRAVENOUS

## 2015-03-28 MED ORDER — HEPARIN SODIUM (PORCINE) 5000 UNIT/ML IJ SOLN
INTRAMUSCULAR | Status: AC
  Filled 2015-03-28: qty 1

## 2015-03-28 MED ORDER — BUPIVACAINE HCL (PF) 0.5 % IJ SOLN
INTRAMUSCULAR | Status: DC | PRN
  Administered 2015-03-28: 10 mL

## 2015-03-28 MED ORDER — CEFAZOLIN SODIUM-DEXTROSE 2-3 GM-% IV SOLR
INTRAVENOUS | Status: AC
  Filled 2015-03-28: qty 50

## 2015-03-28 MED ORDER — CEFAZOLIN SODIUM-DEXTROSE 2-3 GM-% IV SOLR
2.0000 g | INTRAVENOUS | Status: AC
  Administered 2015-03-28: 2 g via INTRAVENOUS

## 2015-03-28 MED ORDER — ONDANSETRON HCL 4 MG/2ML IJ SOLN: 4.0000 mg | Freq: Once | INTRAMUSCULAR | Status: DC | PRN

## 2015-03-28 MED ORDER — PROPOFOL 10 MG/ML IV BOLUS
INTRAVENOUS | Status: DC | PRN
  Administered 2015-03-28: 80 mg via INTRAVENOUS

## 2015-03-28 MED ORDER — FENTANYL CITRATE (PF) 100 MCG/2ML IJ SOLN
INTRAMUSCULAR | Status: DC | PRN
  Administered 2015-03-28: 25 ug via INTRAVENOUS

## 2015-03-28 MED ORDER — PHENYLEPHRINE HCL 10 MG/ML IJ SOLN
INTRAMUSCULAR | Status: DC | PRN
  Administered 2015-03-28 (×4): 100 ug via INTRAVENOUS

## 2015-03-28 MED ORDER — PAPAVERINE HCL 30 MG/ML IJ SOLN
INTRAMUSCULAR | Status: AC
  Filled 2015-03-28: qty 2

## 2015-03-28 SURGICAL SUPPLY — 58 items
APPLIER CLIP 11 MED OPEN (CLIP)
APPLIER CLIP 9.375 SM OPEN (CLIP)
APR CLP MED 11 20 MLT OPN (CLIP)
APR CLP SM 9.3 20 MLT OPN (CLIP)
BAG COUNTER SPONGE EZ (MISCELLANEOUS) ×1 IMPLANT
BAG DECANTER FOR FLEXI CONT (MISCELLANEOUS) ×3 IMPLANT
BAG SPNG 4X4 CLR HAZ (MISCELLANEOUS)
BLADE SURG SZ11 CARB STEEL (BLADE) ×3 IMPLANT
BOOT SUTURE AID YELLOW STND (SUTURE) ×3 IMPLANT
BRUSH SCRUB 4% CHG (MISCELLANEOUS) ×3 IMPLANT
CANISTER SUCT 1200ML W/VALVE (MISCELLANEOUS) ×3 IMPLANT
CHLORAPREP W/TINT 26ML (MISCELLANEOUS) ×3 IMPLANT
CLIP APPLIE 11 MED OPEN (CLIP) IMPLANT
CLIP APPLIE 9.375 SM OPEN (CLIP) IMPLANT
COUNTER SPONGE BAG EZ (MISCELLANEOUS)
DRESSING SURGICEL FIBRLLR 1X2 (HEMOSTASIS) ×1 IMPLANT
DRSG SURGICEL FIBRILLAR 1X2 (HEMOSTASIS) ×3
ELECT CAUTERY BLADE 6.4 (BLADE) ×3 IMPLANT
GLOVE SURG SYN 8.0 (GLOVE) ×15 IMPLANT
GLOVE SURG SYN 8.0 PF PI (GLOVE) ×1 IMPLANT
GOWN STRL REUS W/ TWL LRG LVL3 (GOWN DISPOSABLE) ×1 IMPLANT
GOWN STRL REUS W/ TWL XL LVL3 (GOWN DISPOSABLE) ×1 IMPLANT
GOWN STRL REUS W/TWL LRG LVL3 (GOWN DISPOSABLE) ×6
GOWN STRL REUS W/TWL XL LVL3 (GOWN DISPOSABLE) ×3
IV NS 500ML (IV SOLUTION) ×3
IV NS 500ML BAXH (IV SOLUTION) ×1 IMPLANT
KIT RM TURNOVER STRD PROC AR (KITS) ×3 IMPLANT
LABEL OR SOLS (LABEL) ×3 IMPLANT
LIQUID BAND (GAUZE/BANDAGES/DRESSINGS) ×3 IMPLANT
LOOP RED MAXI  1X406MM (MISCELLANEOUS) ×2
LOOP VESSEL MAXI 1X406 RED (MISCELLANEOUS) ×1 IMPLANT
LOOP VESSEL MINI 0.8X406 BLUE (MISCELLANEOUS) ×2 IMPLANT
LOOPS BLUE MINI 0.8X406MM (MISCELLANEOUS) ×2
NDL FILTER BLUNT 18X1 1/2 (NEEDLE) ×1 IMPLANT
NEEDLE FILTER BLUNT 18X 1/2SAF (NEEDLE) ×2
NEEDLE FILTER BLUNT 18X1 1/2 (NEEDLE) ×1 IMPLANT
NS IRRIG 500ML POUR BTL (IV SOLUTION) ×3 IMPLANT
PACK EXTREMITY ARMC (MISCELLANEOUS) ×3 IMPLANT
PAD GROUND ADULT SPLIT (MISCELLANEOUS) ×3 IMPLANT
PAD PREP 24X41 OB/GYN DISP (PERSONAL CARE ITEMS) ×3 IMPLANT
PUNCH SURGICAL ROTATE 2.7MM (MISCELLANEOUS) IMPLANT
STOCKINETTE 48X4 2 PLY STRL (GAUZE/BANDAGES/DRESSINGS) ×1 IMPLANT
STOCKINETTE STRL 4IN 9604848 (GAUZE/BANDAGES/DRESSINGS) ×3 IMPLANT
SUT GTX CV-6 30 (SUTURE) ×6 IMPLANT
SUT MNCRL+ 5-0 UNDYED PC-3 (SUTURE) ×1 IMPLANT
SUT MONOCRYL 5-0 (SUTURE) ×2
SUT PROLENE 6 0 BV (SUTURE) ×16 IMPLANT
SUT SILK 2 0 (SUTURE) ×3
SUT SILK 2 0 SH (SUTURE) ×3 IMPLANT
SUT SILK 2-0 18XBRD TIE 12 (SUTURE) ×1 IMPLANT
SUT SILK 3 0 (SUTURE) ×3
SUT SILK 3-0 18XBRD TIE 12 (SUTURE) ×1 IMPLANT
SUT SILK 4 0 (SUTURE) ×3
SUT SILK 4-0 18XBRD TIE 12 (SUTURE) ×1 IMPLANT
SUT VIC AB 3-0 SH 27 (SUTURE) ×6
SUT VIC AB 3-0 SH 27X BRD (SUTURE) ×2 IMPLANT
SYR 20CC LL (SYRINGE) ×3 IMPLANT
SYR 3ML LL SCALE MARK (SYRINGE) ×3 IMPLANT

## 2015-03-28 NOTE — Anesthesia Postprocedure Evaluation (Signed)
Anesthesia Post Note  Patient: Brandi Cline  Procedure(s) Performed: Procedure(s) (LRB): Creation of arteriovenous fistula left arm (Left)  Patient location during evaluation: PACU Level of consciousness: awake Pain management: pain level controlled Vital Signs Assessment: post-procedure vital signs reviewed and stable Respiratory status: spontaneous breathing Cardiovascular status: blood pressure returned to baseline Postop Assessment: no headache Anesthetic complications: no    Last Vitals:  Filed Vitals:   03/28/15 0644 03/28/15 0923  BP: 115/39 104/45  Pulse: 73 64  Temp: 36.3 C 36.5 C  Resp: 24 18    Last Pain:  Filed Vitals:   03/28/15 0955  PainSc: Jodelle Green, Jamaree Hosier M

## 2015-03-28 NOTE — Anesthesia Procedure Notes (Signed)
Procedure Name: LMA Insertion Date/Time: 03/28/2015 7:46 AM Performed by: Almeta Monas Pre-anesthesia Checklist: Patient identified, Emergency Drugs available, Suction available and Patient being monitored Patient Re-evaluated:Patient Re-evaluated prior to inductionOxygen Delivery Method: Circle system utilized Preoxygenation: Pre-oxygenation with 100% oxygen Intubation Type: IV induction Ventilation: Mask ventilation without difficulty LMA: LMA inserted LMA Size: 3.5 Number of attempts: 1 Placement Confirmation: ETT inserted through vocal cords under direct vision,  positive ETCO2,  CO2 detector and breath sounds checked- equal and bilateral Dental Injury: Teeth and Oropharynx as per pre-operative assessment

## 2015-03-28 NOTE — Op Note (Signed)
OPERATIVE NOTE   PROCEDURE: left brachial cephalic arteriovenous fistula placement  PRE-OPERATIVE DIAGNOSIS: End Stage Renal Disease  POST-OPERATIVE DIAGNOSIS: End Stage Renal Disease  SURGEON: Aneliz Carbary, Latina Craver  ASSISTANT(S): Ms. Raul Del  ANESTHESIA: general  ESTIMATED BLOOD LOSS: <50 cc  FINDING(S): 4 mm cephalic vein  SPECIMEN(S):  none  INDICATIONS:   Brandi Cline is a 81 y.o. female who presents with end stage renal disease.  The patient is scheduled for left brachiocephalic arteriovenous fistula placement.  The patient is aware the risks include but are not limited to: bleeding, infection, steal syndrome, nerve damage, ischemic monomelic neuropathy, failure to mature, and need for additional procedures.  The patient is aware of the risks of the procedure and elects to proceed forward.  DESCRIPTION: After full informed written consent was obtained from the patient, the patient was brought back to the operating room and placed supine upon the operating table.  Prior to induction, the patient received IV antibiotics.   After obtaining adequate anesthesia, the patient was then prepped and draped in the standard fashion for a left arm access procedure. After induction of anesthesia a large cephalic vein was easily visualized in the antecubital fossa    A curvilinear incision was then created midway between the brachial impulse and the cephalic vein. The cephalic vein was then identified and dissected circumferentially. It was marked with a surgical marker. It was then transected distally and [facility] coronary dilators were used to sound the vein. The vein easily accepted a 4 mm Gerre Pebbles and therefore I decided to proceed with fistula creation rather than radial axillary graft creation. This decision was based on the finding of a generous sized cephalic vein   Attention was then turned to the brachial artery which was exposed through the same incision and looped proximally  and distally. Side branches were controlled with 4-0 silk ties.  The distal segment of the vein was ligated with a  2-0 silk, and the vein was transected.  The proximal segment was interrogated with serial dilators.  The vein accepted up to a 4 mm dilator without any difficulty. Heparinized saline was infused into the vein and clamped it with a small bulldog.  At this point, I reset my exposure of the brachial artery and controlled the artery with vessel loops proximally and distally.  An arteriotomy was then made with a #11 blade, and extended with a Potts scissor.  Heparinized saline was injected proximal and distal into the radial artery.  The vein was then approximated to the artery while the artery was in its native bed and subsequently the vein was beveled using Potts scissors. The vein was then sewn to the artery in an end-to-side configuration with interrupted 6-0 Prolene sutures.  Prior to completing this anastomosis Flushing maneuvers were performed and the artery was allowed to forward and back bleed.  There was no evidence of clot from any vessels.  I completed the anastomosis in the usual fashion and then released all vessel loops and clamps.    There was good  thrill in the venous outflow, and there was 1+ palpable radial pulse.  At this point, I irrigated out the surgical wound.  There was no further active bleeding.  The subcutaneous tissue was reapproximated with a running stitch of 3-0 Vicryl.  The skin was then reapproximated with a running subcuticular stitch of 4-0 Vicryl.  The skin was then cleaned, dried, and reinforced with Dermabond.    The patient tolerated this  procedure well.   COMPLICATIONS: None  CONDITION: [facility]  Office: [phone]   03/28/2015, 9:16 AM

## 2015-03-28 NOTE — Anesthesia Preprocedure Evaluation (Addendum)
Anesthesia Evaluation  Patient identified by MRN, date of birth, ID band Patient awake    Reviewed: Allergy & Precautions, NPO status , Patient's Chart, lab work & pertinent test results  Airway Mallampati: II  TM Distance: >3 FB Neck ROM: Limited    Dental  (+) Teeth Intact Her small partial was removed--has almost all of her teeth and they are intact.:   Pulmonary shortness of breath, at rest and Long-Term Oxygen Therapy, COPD,  oxygen dependent, former smoker,    breath sounds clear to auscultation       Cardiovascular Exercise Tolerance: Poor hypertension, + angina + CAD and +CHF   Rhythm:Regular Rate:Normal     Neuro/Psych Anxiety    GI/Hepatic GERD  Medicated and Controlled,  Endo/Other    Renal/GU Dialysis and ESRFRenal disease     Musculoskeletal  (+) Arthritis , Osteoarthritis,    Abdominal (+)  Abdomen: soft.    Peds  Hematology  (+) anemia , neg HIV, Hb 9.4.   Anesthesia Other Findings Frail 80 yo with ESRD, HD MWF via permcath in right chest, COPD, home O2 24/7 at up to 7l/min. Hx esophageal varices.  Reproductive/Obstetrics                            Anesthesia Physical Anesthesia Plan  ASA: IV  Anesthesia Plan: General   Post-op Pain Management:    Induction: Intravenous  Airway Management Planned: LMA  Additional Equipment:   Intra-op Plan:   Post-operative Plan: Extubation in OR  Informed Consent: I have reviewed the patients History and Physical, chart, labs and discussed the procedure including the risks, benefits and alternatives for the proposed anesthesia with the patient or authorized representative who has indicated his/her understanding and acceptance.     Plan Discussed with: CRNA and Surgeon  Anesthesia Plan Comments: (And with her daughter.)        Anesthesia Quick Evaluation

## 2015-03-28 NOTE — H&P (Signed)
[facility] History & Physical Update  The patient was interviewed and re-examined.  The patient's previous History and Physical has been reviewed and is unchanged.  There is no change in the plan of care. We plan to proceed with the scheduled procedure.  Kendrew Paci, Latina Craver, MD  03/28/2015, 7:40 AM

## 2015-03-28 NOTE — Transfer of Care (Signed)
Immediate Anesthesia Transfer of Care Note  Patient: Brandi Cline  Procedure(s) Performed: Procedure(s): Creation of arteriovenous fistula left arm (Left)  Patient Location: PACU  Anesthesia Type:General  Level of Consciousness: sedated  Airway & Oxygen Therapy: Patient Spontanous Breathing and Patient connected to nasal cannula oxygen  Post-op Assessment: Report given to RN and Post -op Vital signs reviewed and stable  Post vital signs: Reviewed and stable  Last Vitals:  Filed Vitals:   03/28/15 0644  BP: 115/39  Pulse: 73  Temp: 36.3 C  Resp: 24    Complications: No apparent anesthesia complications

## 2015-03-28 NOTE — OR Nursing (Signed)
Leg wraps removed per instruction of dr schnier. Pt and family instructed for pt to use compression hose and call office if legs swell

## 2015-04-02 ENCOUNTER — Encounter: Payer: Self-pay | Admitting: Internal Medicine

## 2015-04-02 ENCOUNTER — Ambulatory Visit: Payer: Medicare HMO | Admitting: Family Medicine

## 2015-04-02 ENCOUNTER — Ambulatory Visit (INDEPENDENT_AMBULATORY_CARE_PROVIDER_SITE_OTHER): Payer: Medicare HMO | Admitting: Internal Medicine

## 2015-04-02 VITALS — BP 130/60 | HR 73 | Temp 98.0°F | Wt 140.0 lb

## 2015-04-02 DIAGNOSIS — H9192 Unspecified hearing loss, left ear: Secondary | ICD-10-CM | POA: Diagnosis not present

## 2015-04-02 MED ORDER — PREDNISONE 20 MG PO TABS: 40.0000 mg | ORAL_TABLET | Freq: Every day | ORAL | Status: DC

## 2015-04-02 NOTE — Assessment & Plan Note (Signed)
With roaring Changes with blowing out nose, etc May be ET dysfunction but hearing is a ongoing problem Will try 3 days of prednisone Set up with ENT

## 2015-04-02 NOTE — Progress Notes (Signed)
Subjective:    Patient ID: Brandi Cline, female    DOB: 1931/01/08, 81 y.o.   MRN: [id number]  HPI Here due to persistent ear problems Here with sister Head "roars" at times---- has trouble hearing May improve briefly with bending over or blowing out nose  Started about a month ago Always in left No recent travel  No recent illness No fever, cough, congestion (may have started with dialysis) Stable poor hearing--- despite aides  Current Outpatient Prescriptions on File Prior to Visit  Medication Sig Dispense Refill  . albuterol (PROVENTIL HFA;VENTOLIN HFA) 108 (90 BASE) MCG/ACT inhaler Inhale 2 puffs into the lungs every 6 (six) hours as needed for wheezing or shortness of breath. 1 Inhaler 11  . albuterol (PROVENTIL) (2.5 MG/3ML) 0.083% nebulizer solution Take 3 mLs (2.5 mg total) by nebulization every 4 (four) hours as needed. Shortness of breath 75 mL 11  . ALPRAZolam (XANAX) 0.5 MG tablet TAKE ONE TABLET BY MOUTH THREE TIMES DAILY AS NEEDED 90 tablet 0  . arformoterol (BROVANA) 15 MCG/2ML NEBU Take 2 mLs (15 mcg total) by nebulization 2 (two) times daily. 60 mL prn  . Cholecalciferol (VITAMIN D-3) 5000 UNITS TABS Take 1 tablet by mouth daily.    . fluticasone (FLONASE) 50 MCG/ACT nasal spray Place 2 sprays into both nostrils 2 (two) times daily.    . isosorbide mononitrate (IMDUR) 30 MG 24 hr tablet TAKE TWO TABLETS BY MOUTH DAILY 60 tablet 11  . magnesium oxide (MAG-OX) 400 MG tablet Take 400 mg by mouth daily.     . nitroGLYCERIN (NITROSTAT) 0.4 MG SL tablet Place 1 tablet (0.4 mg total) under the tongue every 5 (five) minutes as needed. Chest pain 25 tablet 3  . omeprazole (PRILOSEC) 20 MG capsule TAKE ONE CAPSULE BY MOUTH DAILY 90 capsule 3  . oxyCODONE-acetaminophen (ROXICET) 5-325 MG tablet Take 1 tablet by mouth every 4 (four) hours as needed for moderate pain or severe pain. 50 tablet 0  . Zinc Acetate, Oral, (ZINC ACETATE PO) Take 1 tablet by mouth daily.     No current  facility-administered medications on file prior to visit.    Allergies  Allergen Reactions  . Avelox [Moxifloxacin Hcl In Nacl] Shortness Of Breath, Swelling and Rash  . Codeine Shortness Of Breath and Other (See Comments)    Couldn't breath  . Theophyllines     Slight increased heart rate  . Ciprofloxacin Other (See Comments)    nervous  . Milk-Related Compounds Other (See Comments)    Whole milk hurts stomach. Not all dairy products do this.  . Hydrocodone Other (See Comments)    "messes with my head."  . Tape     Please use paper tape only  . Lisinopril Rash    Past Medical History  Diagnosis Date  . Dyspnea   . Esophageal reflux   . CAD (coronary artery disease)   . Hypertension   . Crohn's disease (HCC)   . Allergic rhinitis   . COPD (chronic obstructive pulmonary disease) (HCC)   . History of kidney stones   . History of shingles   . Trigeminal neuralgia   . Family history of anesthesia complication     " SISTER HAD BAD FEELINGS "  . Anginal pain (HCC)   . Anxiety     ' JUST FOR MY BREATHING "  . Arthritis     mild in hands  . Hyperlipidemia   . CKD (chronic kidney disease)   . Kidney  stones     Past Surgical History  Procedure Laterality Date  . Coronary stents    . Lithotripsy    . Ureteral stent placement    . Cholecystectomy    . Total abdominal hysterectomy    . Partial resections large and small bowel for crohn's    . Breast lumps benign    . Left heart catheterization with coronary angiogram N/A 10/28/2011    Procedure: LEFT HEART CATHETERIZATION WITH CORONARY ANGIOGRAM;  Surgeon: Lesleigh Noe, MD;  Location: [facility] CATH LAB;  Service: Cardiovascular;  Laterality: N/A;  . Peripheral vascular catheterization N/A 01/31/2015    Procedure: Dialysis/Perma Catheter Insertion;  Surgeon: Renford Dills, MD;  Location: [facility] INVASIVE CV LAB;  Service: Cardiovascular;  Laterality: N/A;  . Endobronchial ultrasound N/A 02/05/2015    Procedure:  ENDOBRONCHIAL ULTRASOUND;  Surgeon: Stephanie Acre, MD;  Location: [facility] ORS;  Service: Cardiopulmonary;  Laterality: N/A;  . Esophagogastroduodenoscopy (egd) with propofol N/A 02/03/2015    Procedure: ESOPHAGOGASTRODUODENOSCOPY (EGD) WITH PROPOFOL;  Surgeon: Midge Minium, MD;  Location: [facility] ENDOSCOPY;  Service: Endoscopy;  Laterality: N/A;  Look into the colon and stomach with a light to look for the source of bleeding.  . Colonoscopy with propofol N/A 02/03/2015    Procedure: COLONOSCOPY WITH PROPOFOL;  Surgeon: Midge Minium, MD;  Location: [facility] ENDOSCOPY;  Service: Endoscopy;  Laterality: N/A;  . Av fistula placement Left 03/28/2015    Procedure: Creation of arteriovenous fistula left arm;  Surgeon: Renford Dills, MD;  Location: [facility] ORS;  Service: Vascular;  Laterality: Left;    Family History  Problem Relation Age of Onset  . Breast cancer Sister   . COPD Sister   . Stroke Mother   . Prostate cancer Father   . Prostate cancer Brother   . Heart attack Brother     Social History   Social History  . Marital Status: Widowed    Spouse Name: N/A  . Number of Children: N/A  . Years of Education: N/A   Occupational History  . PT [employer]    Social History Main Topics  . Smoking status: Former Smoker -- 1.00 packs/day for 30 years    Types: Cigarettes    Quit date: 04/12/1990  . Smokeless tobacco: Never Used  . Alcohol Use: No  . Drug Use: No  . Sexual Activity: No   Other Topics Concern  . Not on file   Social History Narrative   Has a living will and HPOA- Darria Lukacs.   Would desire CPR.   Would not want prolonged life support, would not want feeding tubes.         Review of Systems Fistula placed last week in left arm Still using subclavian cathter for dialysis Wearing compression hose now--had UNNA boots for a while    Objective:   Physical Exam  HENT:  Mild nasal inflammation TMs are not inflamed Normal canals  No tragal tenderness No clear TM  retraction  Neck: Normal range of motion. Neck supple.  Musculoskeletal:  1+ edema with hose  Lymphadenopathy:    She has no cervical adenopathy.          Assessment & Plan:

## 2015-04-02 NOTE — Progress Notes (Signed)
Pre visit review using our clinic review tool, if applicable. No additional management support is needed unless otherwise documented below in the visit note. 

## 2015-04-10 ENCOUNTER — Ambulatory Visit (INDEPENDENT_AMBULATORY_CARE_PROVIDER_SITE_OTHER)
Admission: RE | Admit: 2015-04-10 | Discharge: 2015-04-10 | Disposition: A | Discharge status: Home / Self Care | Payer: Medicare HMO | Source: Ambulatory Visit | Attending: Internal Medicine | Admitting: Internal Medicine

## 2015-04-10 ENCOUNTER — Ambulatory Visit (INDEPENDENT_AMBULATORY_CARE_PROVIDER_SITE_OTHER): Payer: Medicare HMO | Admitting: Internal Medicine

## 2015-04-10 ENCOUNTER — Encounter: Payer: Self-pay | Admitting: Internal Medicine

## 2015-04-10 VITALS — BP 128/60 | HR 90 | Temp 98.0°F | Resp 24

## 2015-04-10 DIAGNOSIS — J439 Emphysema, unspecified: Secondary | ICD-10-CM

## 2015-04-10 DIAGNOSIS — R059 Cough, unspecified: Secondary | ICD-10-CM

## 2015-04-10 DIAGNOSIS — R05 Cough: Secondary | ICD-10-CM

## 2015-04-10 MED ORDER — AMOXICILLIN-POT CLAVULANATE 875-125 MG PO TABS: 1.0000 | ORAL_TABLET | Freq: Two times a day (BID) | ORAL | Status: DC

## 2015-04-10 MED ORDER — PREDNISONE 20 MG PO TABS: 40.0000 mg | ORAL_TABLET | Freq: Every day | ORAL | Status: DC

## 2015-04-10 NOTE — Assessment & Plan Note (Addendum)
Appears ill and lung findings Will check CXR CXR looks okay though--no clear pneumonia Will treat with empiric augmentin (she tolerates this)

## 2015-04-10 NOTE — Progress Notes (Signed)
Pre visit review using our clinic review tool, if applicable. No additional management support is needed unless otherwise documented below in the visit note. 

## 2015-04-10 NOTE — Assessment & Plan Note (Signed)
Rhonchi and reduced breath sounds Mild exacerbation? Will give 5 days of prednisone

## 2015-04-10 NOTE — Progress Notes (Signed)
Subjective:    Patient ID: Brandi Cline, female    DOB: 29-Jan-1931, 81 y.o.   MRN: [id number]  HPI Here due to bad cough Coughed all night Sides are sore Has feeling of tightness around her midsection Hard to breath Feels cold Started yesterday afternoon Low grade fever  Dialysis today Nurse there was concerned about how her lungs sounded  Current Outpatient Prescriptions on File Prior to Visit  Medication Sig Dispense Refill  . albuterol (PROVENTIL HFA;VENTOLIN HFA) 108 (90 BASE) MCG/ACT inhaler Inhale 2 puffs into the lungs every 6 (six) hours as needed for wheezing or shortness of breath. 1 Inhaler 11  . albuterol (PROVENTIL) (2.5 MG/3ML) 0.083% nebulizer solution Take 3 mLs (2.5 mg total) by nebulization every 4 (four) hours as needed. Shortness of breath 75 mL 11  . ALPRAZolam (XANAX) 0.5 MG tablet TAKE ONE TABLET BY MOUTH THREE TIMES DAILY AS NEEDED 90 tablet 0  . arformoterol (BROVANA) 15 MCG/2ML NEBU Take 2 mLs (15 mcg total) by nebulization 2 (two) times daily. 60 mL prn  . Cholecalciferol (VITAMIN D-3) 5000 UNITS TABS Take 1 tablet by mouth daily.    . fluticasone (FLONASE) 50 MCG/ACT nasal spray Place 2 sprays into both nostrils 2 (two) times daily.    . isosorbide mononitrate (IMDUR) 30 MG 24 hr tablet TAKE TWO TABLETS BY MOUTH DAILY 60 tablet 11  . magnesium oxide (MAG-OX) 400 MG tablet Take 400 mg by mouth daily.     . nitroGLYCERIN (NITROSTAT) 0.4 MG SL tablet Place 1 tablet (0.4 mg total) under the tongue every 5 (five) minutes as needed. Chest pain 25 tablet 3  . omeprazole (PRILOSEC) 20 MG capsule TAKE ONE CAPSULE BY MOUTH DAILY 90 capsule 3  . oxyCODONE-acetaminophen (ROXICET) 5-325 MG tablet Take 1 tablet by mouth every 4 (four) hours as needed for moderate pain or severe pain. 50 tablet 0  . predniSONE (DELTASONE) 20 MG tablet Take 2 tablets (40 mg total) by mouth daily. 6 tablet 0  . Zinc Acetate, Oral, (ZINC ACETATE PO) Take 1 tablet by mouth daily.     No  current facility-administered medications on file prior to visit.    Allergies  Allergen Reactions  . Avelox [Moxifloxacin Hcl In Nacl] Shortness Of Breath, Swelling and Rash  . Codeine Shortness Of Breath and Other (See Comments)    Couldn't breath  . Theophyllines     Slight increased heart rate  . Ciprofloxacin Other (See Comments)    nervous  . Milk-Related Compounds Other (See Comments)    Whole milk hurts stomach. Not all dairy products do this.  . Hydrocodone Other (See Comments)    "messes with my head."  . Tape     Please use paper tape only  . Lisinopril Rash    Past Medical History  Diagnosis Date  . Dyspnea   . Esophageal reflux   . CAD (coronary artery disease)   . Hypertension   . Crohn's disease (HCC)   . Allergic rhinitis   . COPD (chronic obstructive pulmonary disease) (HCC)   . History of kidney stones   . History of shingles   . Trigeminal neuralgia   . Family history of anesthesia complication     " SISTER HAD BAD FEELINGS "  . Anginal pain (HCC)   . Anxiety     ' JUST FOR MY BREATHING "  . Arthritis     mild in hands  . Hyperlipidemia   . CKD (chronic kidney disease)   .  Kidney stones     Past Surgical History  Procedure Laterality Date  . Coronary stents    . Lithotripsy    . Ureteral stent placement    . Cholecystectomy    . Total abdominal hysterectomy    . Partial resections large and small bowel for crohn's    . Breast lumps benign    . Left heart catheterization with coronary angiogram N/A 10/28/2011    Procedure: LEFT HEART CATHETERIZATION WITH CORONARY ANGIOGRAM;  Surgeon: Lesleigh Noe, MD;  Location: [facility] CATH LAB;  Service: Cardiovascular;  Laterality: N/A;  . Peripheral vascular catheterization N/A 01/31/2015    Procedure: Dialysis/Perma Catheter Insertion;  Surgeon: Renford Dills, MD;  Location: [facility] INVASIVE CV LAB;  Service: Cardiovascular;  Laterality: N/A;  . Endobronchial ultrasound N/A 02/05/2015    Procedure:  ENDOBRONCHIAL ULTRASOUND;  Surgeon: Stephanie Acre, MD;  Location: [facility] ORS;  Service: Cardiopulmonary;  Laterality: N/A;  . Esophagogastroduodenoscopy (egd) with propofol N/A 02/03/2015    Procedure: ESOPHAGOGASTRODUODENOSCOPY (EGD) WITH PROPOFOL;  Surgeon: Midge Minium, MD;  Location: [facility] ENDOSCOPY;  Service: Endoscopy;  Laterality: N/A;  Look into the colon and stomach with a light to look for the source of bleeding.  . Colonoscopy with propofol N/A 02/03/2015    Procedure: COLONOSCOPY WITH PROPOFOL;  Surgeon: Midge Minium, MD;  Location: [facility] ENDOSCOPY;  Service: Endoscopy;  Laterality: N/A;  . Av fistula placement Left 03/28/2015    Procedure: Creation of arteriovenous fistula left arm;  Surgeon: Renford Dills, MD;  Location: [facility] ORS;  Service: Vascular;  Laterality: Left;    Family History  Problem Relation Age of Onset  . Breast cancer Sister   . COPD Sister   . Stroke Mother   . Prostate cancer Father   . Prostate cancer Brother   . Heart attack Brother     Social History   Social History  . Marital Status: Widowed    Spouse Name: N/A  . Number of Children: N/A  . Years of Education: N/A   Occupational History  . PT [employer]    Social History Main Topics  . Smoking status: Former Smoker -- 1.00 packs/day for 30 years    Types: Cigarettes    Quit date: 04/12/1990  . Smokeless tobacco: Never Used  . Alcohol Use: No  . Drug Use: No  . Sexual Activity: No   Other Topics Concern  . Not on file   Social History Narrative   Has a living will and HPOA- Leona Rosene.   Would desire CPR.   Would not want prolonged life support, would not want feeding tubes.         Review of Systems  Mild abdominal pain--bloats at times Appetite is still good--but mostly eats soup No vomiting  Chronic diarrhea from Crohn's     Objective:   Physical Exam  Constitutional:  Appears ill tachypneic at rest  Neck: Normal range of motion. No thyromegaly present.    Cardiovascular: Normal rate, regular rhythm and normal heart sounds.  Exam reveals no gallop.   No murmur heard. Pulmonary/Chest: No respiratory distress. She has no wheezes.  No dullness  Decreased breath sounds entire right side and left base Rhonchi at both bases  Lymphadenopathy:    She has no cervical adenopathy.          Assessment & Plan:

## 2015-04-15 ENCOUNTER — Emergency Department
Admission: EM | Admit: 2015-04-15 | Discharge: 2015-04-15 | Disposition: A | Discharge status: Home / Self Care | Payer: Medicare HMO | Attending: Emergency Medicine | Admitting: Emergency Medicine

## 2015-04-15 ENCOUNTER — Other Ambulatory Visit: Payer: Self-pay

## 2015-04-15 ENCOUNTER — Encounter: Payer: Self-pay | Admitting: *Deleted

## 2015-04-15 DIAGNOSIS — Z7952 Long term (current) use of systemic steroids: Secondary | ICD-10-CM | POA: Insufficient documentation

## 2015-04-15 DIAGNOSIS — I120 Hypertensive chronic kidney disease with stage 5 chronic kidney disease or end stage renal disease: Secondary | ICD-10-CM | POA: Diagnosis not present

## 2015-04-15 DIAGNOSIS — Z992 Dependence on renal dialysis: Secondary | ICD-10-CM | POA: Insufficient documentation

## 2015-04-15 DIAGNOSIS — Z79899 Other long term (current) drug therapy: Secondary | ICD-10-CM | POA: Diagnosis not present

## 2015-04-15 DIAGNOSIS — N186 End stage renal disease: Secondary | ICD-10-CM | POA: Insufficient documentation

## 2015-04-15 DIAGNOSIS — Z87891 Personal history of nicotine dependence: Secondary | ICD-10-CM | POA: Diagnosis not present

## 2015-04-15 DIAGNOSIS — R197 Diarrhea, unspecified: Secondary | ICD-10-CM | POA: Insufficient documentation

## 2015-04-15 DIAGNOSIS — Z792 Long term (current) use of antibiotics: Secondary | ICD-10-CM | POA: Diagnosis not present

## 2015-04-15 LAB — COMPREHENSIVE METABOLIC PANEL
ALBUMIN: 3.2 g/dL — AB (ref 3.5–5.0)
ALK PHOS: 287 U/L — AB (ref 38–126)
ALT: 36 U/L (ref 14–54)
AST: 36 U/L (ref 15–41)
Anion gap: 12 (ref 5–15)
BILIRUBIN TOTAL: 1.6 mg/dL — AB (ref 0.3–1.2)
BUN: 12 mg/dL (ref 6–20)
CO2: 29 mmol/L (ref 22–32)
CREATININE: 3.04 mg/dL — AB (ref 0.44–1.00)
Calcium: 8.5 mg/dL — ABNORMAL LOW (ref 8.9–10.3)
Chloride: 97 mmol/L — ABNORMAL LOW (ref 101–111)
GFR calc Af Amer: 15 mL/min — ABNORMAL LOW (ref >60)
GFR calc non Af Amer: 13 mL/min — ABNORMAL LOW (ref >60)
GLUCOSE: 143 mg/dL — AB (ref 65–99)
POTASSIUM: 3.5 mmol/L (ref 3.5–5.1)
Sodium: 138 mmol/L (ref 135–145)
TOTAL PROTEIN: 6.8 g/dL (ref 6.5–8.1)

## 2015-04-15 LAB — CBC
HEMATOCRIT: 35.1 % (ref 35.0–47.0)
Hemoglobin: 11.3 g/dL — ABNORMAL LOW (ref 12.0–16.0)
MCH: 27.8 pg (ref 26.0–34.0)
MCHC: 32.1 g/dL (ref 32.0–36.0)
MCV: 86.6 fL (ref 80.0–100.0)
PLATELETS: 145 10*3/uL — AB (ref 150–440)
RBC: 4.05 MIL/uL (ref 3.80–5.20)
RDW: 19.7 % — AB (ref 11.5–14.5)
WBC: 10.8 10*3/uL (ref 3.6–11.0)

## 2015-04-15 LAB — C DIFFICILE QUICK SCREEN W PCR REFLEX
C DIFFICILE (CDIFF) TOXIN: NEGATIVE
C Diff antigen: NEGATIVE
C Diff interpretation: NEGATIVE

## 2015-04-15 LAB — LIPASE, BLOOD: Lipase: 44 U/L (ref 11–51)

## 2015-04-15 NOTE — Discharge Instructions (Signed)
As discussed please try probiotics and you can also try fiber to help relieve or ease your diarrhea. Please seek medical attention for any high fevers, chest pain, shortness of breath, change in behavior, persistent vomiting, bloody stool or any other new or concerning symptoms.   Diarrhea Diarrhea is frequent loose and watery bowel movements. It can cause you to feel weak and dehydrated. Dehydration can cause you to become tired and thirsty, have a dry mouth, and have decreased urination that often is dark yellow. Diarrhea is a sign of another problem, most often an infection that will not last long. In most cases, diarrhea typically lasts 2-3 days. However, it can last longer if it is a sign of something more serious. It is important to treat your diarrhea as directed by your caregiver to lessen or prevent future episodes of diarrhea. CAUSES  Some common causes include:  Gastrointestinal infections caused by viruses, bacteria, or parasites.  Food poisoning or food allergies.  Certain medicines, such as antibiotics, chemotherapy, and laxatives.  Artificial sweeteners and fructose.  Digestive disorders. HOME CARE INSTRUCTIONS  Ensure adequate fluid intake (hydration): Have 1 cup (8 oz) of fluid for each diarrhea episode. Avoid fluids that contain simple sugars or sports drinks, fruit juices, whole milk products, and sodas. Your urine should be clear or pale yellow if you are drinking enough fluids. Hydrate with an oral rehydration solution that you can purchase at pharmacies, retail stores, and online. You can prepare an oral rehydration solution at home by mixing the following ingredients together:   - tsp table salt.   tsp baking soda.   tsp salt substitute containing potassium chloride.  1  tablespoons sugar.  1 L (34 oz) of water.  Certain foods and beverages may increase the speed at which food moves through the gastrointestinal (GI) tract. These foods and beverages should be  avoided and include:  Caffeinated and alcoholic beverages.  High-fiber foods, such as raw fruits and vegetables, nuts, seeds, and whole grain breads and cereals.  Foods and beverages sweetened with sugar alcohols, such as xylitol, sorbitol, and mannitol.  Some foods may be well tolerated and may help thicken stool including:  Starchy foods, such as rice, toast, pasta, low-sugar cereal, oatmeal, grits, baked potatoes, crackers, and bagels.  Bananas.  Applesauce.  Add probiotic-rich foods to help increase healthy bacteria in the GI tract, such as yogurt and fermented milk products.  Wash your hands well after each diarrhea episode.  Only take over-the-counter or prescription medicines as directed by your caregiver.  Take a warm bath to relieve any burning or pain from frequent diarrhea episodes. SEEK IMMEDIATE MEDICAL CARE IF:   You are unable to keep fluids down.  You have persistent vomiting.  You have blood in your stool, or your stools are black and tarry.  You do not urinate in 6-8 hours, or there is only a small amount of very dark urine.  You have abdominal pain that increases or localizes.  You have weakness, dizziness, confusion, or light-headedness.  You have a severe headache.  Your diarrhea gets worse or does not get better.  You have a fever or persistent symptoms for more than 2-3 days.  You have a fever and your symptoms suddenly get worse. MAKE SURE YOU:   Understand these instructions.  Will watch your condition.  Will get help right away if you are not doing well or get worse.   This information is not intended to replace advice given to you by  your health care provider. Make sure you discuss any questions you have with your health care provider.   Document Released: 03/19/2002 Document Revised: 04/19/2014 Document Reviewed: 12/05/2011 Elsevier Interactive Patient Education 2016 [employer].  Food Choices to Help Relieve Diarrhea, Adult When  you have diarrhea, the foods you eat and your eating habits are very important. Choosing the right foods and drinks can help relieve diarrhea. Also, because diarrhea can last up to 7 days, you need to replace lost fluids and electrolytes (such as sodium, potassium, and chloride) in order to help prevent dehydration.  WHAT GENERAL GUIDELINES DO I NEED TO FOLLOW?  Slowly drink 1 cup (8 oz) of fluid for each episode of diarrhea. If you are getting enough fluid, your urine will be clear or pale yellow.  Eat starchy foods. Some good choices include white rice, white toast, pasta, low-fiber cereal, baked potatoes (without the skin), saltine crackers, and bagels.  Avoid large servings of any cooked vegetables.  Limit fruit to two servings per day. A serving is  cup or 1 small piece.  Choose foods with less than 2 g of fiber per serving.  Limit fats to less than 8 tsp (38 g) per day.  Avoid fried foods.  Eat foods that have probiotics in them. Probiotics can be found in certain dairy products.  Avoid foods and beverages that may increase the speed at which food moves through the stomach and intestines (gastrointestinal tract). Things to avoid include:  High-fiber foods, such as dried fruit, raw fruits and vegetables, nuts, seeds, and whole grain foods.  Spicy foods and high-fat foods.  Foods and beverages sweetened with high-fructose corn syrup, honey, or sugar alcohols such as xylitol, sorbitol, and mannitol. WHAT FOODS ARE RECOMMENDED? Grains White rice. White, [place], or pita breads (fresh or toasted), including plain rolls, buns, or bagels. White pasta. Saltine, soda, or graham crackers. Pretzels. Low-fiber cereal. Cooked cereals made with water (such as cornmeal, farina, or cream cereals). Plain muffins. Matzo. Melba toast. Zwieback.  Vegetables Potatoes (without the skin). Strained tomato and vegetable juices. Most well-cooked and canned vegetables without seeds. Tender  lettuce. Fruits Cooked or canned applesauce, apricots, cherries, fruit cocktail, grapefruit, peaches, pears, or plums. Fresh bananas, apples without skin, cherries, grapes, cantaloupe, grapefruit, peaches, oranges, or plums.  Meat and Other Protein Products Baked or boiled chicken. Eggs. Tofu. Fish. Seafood. Smooth peanut butter. Ground or well-cooked tender beef, ham, veal, lamb, pork, or poultry.  Dairy Plain yogurt, kefir, and unsweetened liquid yogurt. Lactose-free milk, buttermilk, or soy milk. Plain hard cheese. Beverages Sport drinks. Clear broths. Diluted fruit juices (except prune). Regular, caffeine-free sodas such as ginger ale. Water. Decaffeinated teas. Oral rehydration solutions. Sugar-free beverages not sweetened with sugar alcohols. Other Bouillon, broth, or soups made from recommended foods.  The items listed above may not be a complete list of recommended foods or beverages. Contact your dietitian for more options. WHAT FOODS ARE NOT RECOMMENDED? Grains Whole grain, whole wheat, bran, or rye breads, rolls, pastas, crackers, and cereals. Wild or brown rice. Cereals that contain more than 2 g of fiber per serving. Corn tortillas or taco shells. Cooked or dry oatmeal. Granola. Popcorn. Vegetables Raw vegetables. Cabbage, broccoli, Brussels sprouts, artichokes, baked beans, beet greens, corn, kale, legumes, peas, sweet potatoes, and yams. Potato skins. Cooked spinach and cabbage. Fruits Dried fruit, including raisins and dates. Raw fruits. Stewed or dried prunes. Fresh apples with skin, apricots, mangoes, pears, raspberries, and strawberries.  Meat and Other Protein Products [employer]  peanut butter. Nuts and seeds. Beans and lentils. Tomasa Blase.  Dairy High-fat cheeses. Milk, chocolate milk, and beverages made with milk, such as milk shakes. Cream. Ice cream. Sweets and Desserts Sweet rolls, doughnuts, and sweet breads. Pancakes and waffles. Fats and Oils Butter. Cream sauces.  Margarine. Salad oils. Plain salad dressings. Olives. Avocados.  Beverages Caffeinated beverages (such as coffee, tea, soda, or energy drinks). Alcoholic beverages. Fruit juices with pulp. Prune juice. Soft drinks sweetened with high-fructose corn syrup or sugar alcohols. Other Coconut. Hot sauce. Chili powder. Mayonnaise. Gravy. Cream-based or milk-based soups.  The items listed above may not be a complete list of foods and beverages to avoid. Contact your dietitian for more information. WHAT SHOULD I DO IF I BECOME DEHYDRATED? Diarrhea can sometimes lead to dehydration. Signs of dehydration include dark urine and dry mouth and skin. If you think you are dehydrated, you should rehydrate with an oral rehydration solution. These solutions can be purchased at pharmacies, retail stores, or online.  Drink -1 cup (120-240 mL) of oral rehydration solution each time you have an episode of diarrhea. If drinking this amount makes your diarrhea worse, try drinking smaller amounts more often. For example, drink 1-3 tsp (5-15 mL) every 5-10 minutes.  A general rule for staying hydrated is to drink 1-2 L of fluid per day. Talk to your health care provider about the specific amount you should be drinking each day. Drink enough fluids to keep your urine clear or pale yellow.   This information is not intended to replace advice given to you by your health care provider. Make sure you discuss any questions you have with your health care provider.   Document Released: 06/19/2003 Document Revised: 04/19/2014 Document Reviewed: 02/19/2013 Elsevier Interactive Patient Education [employer]

## 2015-04-15 NOTE — ED Provider Notes (Signed)
[facility] Emergency Department Provider Note   ____________________________________________  Time seen: 30  I have reviewed the triage vital signs and the nursing notes.   HISTORY  Chief Complaint Diarrhea   History limited by: Not Limited   HPI Brandi Cline is a 82 y.o. female with history of cirrhosis, kidney failure on dialysis who presents from dialysis today because of concern for diarrhea. The patient states that she normally has between 8 or 9 episodes of diarrhea a day however yesterday she had roughly 10-12. They were watery which is different. The patient states today she has had 4 episodes of watery diarrhea. She does state that she was recently placed on antibiotics for bronchitis. The patient did have her full session of dialysis today and then was sent here for further evaluation of the diarrhea. The patient denies any new or changing abdominal pain with the diarrhea. The patient denies any vomiting. No fevers.    Past Medical History  Diagnosis Date  . Dyspnea   . Esophageal reflux   . CAD (coronary artery disease)   . Hypertension   . Crohn's disease (HCC)   . Allergic rhinitis   . COPD (chronic obstructive pulmonary disease) (HCC)   . History of kidney stones   . History of shingles   . Trigeminal neuralgia   . Family history of anesthesia complication     " SISTER HAD BAD FEELINGS "  . Anginal pain (HCC)   . Anxiety     ' JUST FOR MY BREATHING "  . Arthritis     mild in hands  . Hyperlipidemia   . CKD (chronic kidney disease)   . Kidney stones     Patient Active Problem List   Diagnosis Date Noted  . Cough 04/10/2015  . Hearing loss in left ear 04/02/2015  . ETD (eustachian tube dysfunction) 02/25/2015  . Abnormal chest CT   . Iron deficiency anemia, unspecified   . Heme + stool   . Intestinal bypass or anastomosis status   . Acute on chronic renal failure (HCC) 01/22/2015  . Cellulitis of leg, left 01/22/2015  .  Acute renal failure (HCC) 01/10/2015  . Leg edema 10/21/2014  . Anemia 10/21/2014  . Abdominal pressure 10/21/2014  . Elevated serum creatinine 10/05/2014  . Thrombocytopenia (HCC) 10/03/2014  . Cystitis 09/07/2014  . Chronic respiratory failure with hypoxia (HCC) 09/03/2014  . Hyperglycemia 06/27/2014  . Elevated liver function tests 06/27/2014  . History of nephrolithiasis 01/10/2014  . Splenic varices 10/25/2013  . Cirrhosis of liver (HCC) 10/03/2013  . CKD (chronic kidney disease), stage II 09/20/2013  . Post herpetic neuralgia 06/13/2013  . Chronic diastolic congestive heart failure, NYHA class 1 (HCC) 04/25/2013  . Essential hypertension 03/21/2007  . Coronary atherosclerosis 03/21/2007  . ALLERGIC RHINITIS 03/21/2007  . COPD with emphysema (HCC) 03/21/2007  . Esophageal reflux 03/21/2007  . Regional enteritis [facility]) 03/21/2007    Past Surgical History  Procedure Laterality Date  . Coronary stents    . Lithotripsy    . Ureteral stent placement    . Cholecystectomy    . Total abdominal hysterectomy    . Partial resections large and small bowel for crohn's    . Breast lumps benign    . Left heart catheterization with coronary angiogram N/A 10/28/2011    Procedure: LEFT HEART CATHETERIZATION WITH CORONARY ANGIOGRAM;  Surgeon: Lesleigh Noe, MD;  Location: [facility] CATH LAB;  Service: Cardiovascular;  Laterality: N/A;  . Peripheral  vascular catheterization N/A 01/31/2015    Procedure: Dialysis/Perma Catheter Insertion;  Surgeon: Renford Dills, MD;  Location: [facility] INVASIVE CV LAB;  Service: Cardiovascular;  Laterality: N/A;  . Endobronchial ultrasound N/A 02/05/2015    Procedure: ENDOBRONCHIAL ULTRASOUND;  Surgeon: Stephanie Acre, MD;  Location: [facility] ORS;  Service: Cardiopulmonary;  Laterality: N/A;  . Esophagogastroduodenoscopy (egd) with propofol N/A 02/03/2015    Procedure: ESOPHAGOGASTRODUODENOSCOPY (EGD) WITH PROPOFOL;  Surgeon: Midge Minium, MD;  Location: [facility] ENDOSCOPY;   Service: Endoscopy;  Laterality: N/A;  Look into the colon and stomach with a light to look for the source of bleeding.  . Colonoscopy with propofol N/A 02/03/2015    Procedure: COLONOSCOPY WITH PROPOFOL;  Surgeon: Midge Minium, MD;  Location: [facility] ENDOSCOPY;  Service: Endoscopy;  Laterality: N/A;  . Av fistula placement Left 03/28/2015    Procedure: Creation of arteriovenous fistula left arm;  Surgeon: Renford Dills, MD;  Location: [facility] ORS;  Service: Vascular;  Laterality: Left;    Current Outpatient Rx  Name  Route  Sig  Dispense  Refill  . albuterol (PROVENTIL HFA;VENTOLIN HFA) 108 (90 BASE) MCG/ACT inhaler   Inhalation   Inhale 2 puffs into the lungs every 6 (six) hours as needed for wheezing or shortness of breath.   1 Inhaler   11     Ventolin HFA   . albuterol (PROVENTIL) (2.5 MG/3ML) 0.083% nebulizer solution   Nebulization   Take 3 mLs (2.5 mg total) by nebulization every 4 (four) hours as needed. Shortness of breath   75 mL   11   . ALPRAZolam (XANAX) 0.5 MG tablet      TAKE ONE TABLET BY MOUTH THREE TIMES DAILY AS NEEDED   90 tablet   0   . amoxicillin-clavulanate (AUGMENTIN) 875-125 MG tablet   Oral   Take 1 tablet by mouth 2 (two) times daily.   20 tablet   0   . arformoterol (BROVANA) 15 MCG/2ML NEBU   Nebulization   Take 2 mLs (15 mcg total) by nebulization 2 (two) times daily.   60 mL   prn   . Cholecalciferol (VITAMIN D-3) 5000 UNITS TABS   Oral   Take 1 tablet by mouth daily.         . fluticasone (FLONASE) 50 MCG/ACT nasal spray   Each Nare   Place 2 sprays into both nostrils 2 (two) times daily.         . isosorbide mononitrate (IMDUR) 30 MG 24 hr tablet      TAKE TWO TABLETS BY MOUTH DAILY   60 tablet   11   . magnesium oxide (MAG-OX) 400 MG tablet   Oral   Take 400 mg by mouth daily.          . nitroGLYCERIN (NITROSTAT) 0.4 MG SL tablet   Sublingual   Place 1 tablet (0.4 mg total) under the tongue every 5 (five) minutes as  needed. Chest pain   25 tablet   3   . omeprazole (PRILOSEC) 20 MG capsule      TAKE ONE CAPSULE BY MOUTH DAILY   90 capsule   3   . oxyCODONE-acetaminophen (ROXICET) 5-325 MG tablet   Oral   Take 1 tablet by mouth every 4 (four) hours as needed for moderate pain or severe pain.   50 tablet   0   . predniSONE (DELTASONE) 20 MG tablet   Oral   Take 2 tablets (40 mg total) by mouth daily.   15  tablet   0     For 5 days, then 1 tab daily for 5 days   . Zinc Acetate, Oral, (ZINC ACETATE PO)   Oral   Take 1 tablet by mouth daily.           Allergies Avelox; Codeine; Theophyllines; Ciprofloxacin; Milk-related compounds; Hydrocodone; Tape; and Lisinopril  Family History  Problem Relation Age of Onset  . Breast cancer Sister   . COPD Sister   . Stroke Mother   . Prostate cancer Father   . Prostate cancer Brother   . Heart attack Brother     Social History Social History  Substance Use Topics  . Smoking status: Former Smoker -- 1.00 packs/day for 30 years    Types: Cigarettes    Quit date: 04/12/1990  . Smokeless tobacco: Never Used  . Alcohol Use: No    Review of Systems  Constitutional: Negative for fever. Cardiovascular: Negative for chest pain. Respiratory: Negative for shortness of breath. Gastrointestinal: Negative for abdominal pain. Positive for diarrhea. Negative for vomiting. Neurological: Negative for headaches, focal weakness or numbness.   10-point ROS otherwise negative.  ____________________________________________   PHYSICAL EXAM:  VITAL SIGNS: ED Triage Vitals  Enc Vitals Group     BP 04/15/15 1521 129/47 mmHg     Pulse Rate 04/15/15 1521 83     Resp 04/15/15 1521 20     Temp 04/15/15 1521 97.7 F (36.5 C)     Temp Source 04/15/15 1521 Oral     SpO2 04/15/15 1521 97 %     Weight 04/15/15 1521 4 lb 6.6 oz (2 kg)     Height 04/15/15 1521 5\' 2"  (1.575 m)   Constitutional: Alert and oriented. Well appearing and in no  distress. Eyes: Conjunctivae are normal. PERRL. Normal extraocular movements. ENT   Head: Normocephalic and atraumatic.   Nose: No congestion/rhinnorhea.   Mouth/Throat: Mucous membranes are moist.   Neck: No stridor. Hematological/Lymphatic/Immunilogical: No cervical lymphadenopathy. Cardiovascular: Normal rate, regular rhythm.  No murmurs, rubs, or gallops. Respiratory: Normal respiratory effort without tachypnea nor retractions. Breath sounds are clear and equal bilaterally. No wheezes/rales/rhonchi. Gastrointestinal: Soft. Distended. Nontender. Genitourinary: Deferred Musculoskeletal: Normal range of motion in all extremities. No joint effusions.  No lower extremity tenderness nor edema. Neurologic:  Normal speech and language. No gross focal neurologic deficits are appreciated.  Skin:  Skin is warm, dry and intact. No rash noted. Psychiatric: Mood and affect are normal. Speech and behavior are normal. Patient exhibits appropriate insight and judgment.  ____________________________________________    LABS (pertinent positives/negatives)  Labs Reviewed  COMPREHENSIVE METABOLIC PANEL - Abnormal; Notable for the following:    Chloride 97 (*)    Glucose, Bld 143 (*)    Creatinine, Ser 3.04 (*)    Calcium 8.5 (*)    Albumin 3.2 (*)    Alkaline Phosphatase 287 (*)    Total Bilirubin 1.6 (*)    GFR calc non Af Amer 13 (*)    GFR calc Af Amer 15 (*)    All other components within normal limits  CBC - Abnormal; Notable for the following:    Hemoglobin 11.3 (*)    RDW 19.7 (*)    Platelets 145 (*)    All other components within normal limits  LIPASE, BLOOD     ____________________________________________   EKG  I, Phineas Semen, attending physician, personally viewed and interpreted this EKG  EKG Time: 1528 Rate: 121 Rhythm: sinus tachycardia Axis: normal Intervals: qtc 468 QRS:  narrow, q waves V1 ST changes: no st elevation Impression: abnormal  ekg  ____________________________________________    RADIOLOGY  None   ____________________________________________   PROCEDURES  Procedure(s) performed: None  Critical Care performed: No  ____________________________________________   INITIAL IMPRESSION / ASSESSMENT AND PLAN / ED COURSE  Pertinent labs & imaging results that were available during my care of the patient were reviewed by me and considered in my medical decision making (see chart for details).  Patient presented from dialysis today because multiple episodes of watery diarrhea. Patient was recently started on antibiotics thus C. difficile was a concern. C. difficile testing however came back negative. Additionally blood work without any overly concerning findings. At this point I had a discussion with patient and family about strategies to help ease the diarrhea including use of probiotics and fiber. Will plan on discharging home with primary care follow-up.  ____________________________________________   FINAL CLINICAL IMPRESSION(S) / ED DIAGNOSES  Final diagnoses:  Diarrhea, unspecified type     Phineas Semen, MD 04/15/15 2149

## 2015-04-15 NOTE — ED Notes (Signed)
Pt sent from dialysis , pt has had diarrhea  For the last 2 weeks, pt reports having 13 stools per day, pt finished dialysis treatment , pt reports fatigue

## 2015-04-17 ENCOUNTER — Other Ambulatory Visit: Payer: Self-pay | Admitting: Nephrology

## 2015-04-17 ENCOUNTER — Other Ambulatory Visit: Payer: Self-pay | Admitting: Radiology

## 2015-04-17 DIAGNOSIS — R188 Other ascites: Secondary | ICD-10-CM

## 2015-04-18 ENCOUNTER — Ambulatory Visit
Admission: RE | Admit: 2015-04-18 | Discharge: 2015-04-18 | Disposition: A | Discharge status: Home / Self Care | Payer: Medicare HMO | Source: Ambulatory Visit | Attending: Nephrology | Admitting: Nephrology

## 2015-04-18 DIAGNOSIS — R188 Other ascites: Secondary | ICD-10-CM | POA: Diagnosis present

## 2015-04-18 DIAGNOSIS — K7460 Unspecified cirrhosis of liver: Secondary | ICD-10-CM | POA: Insufficient documentation

## 2015-04-18 HISTORY — DX: Heart failure, unspecified: I50.9

## 2015-04-18 HISTORY — DX: Anemia, unspecified: D64.9

## 2015-04-18 NOTE — Procedures (Signed)
US guided paracentesis performed without complication.  Removed 3.3 liters of fluid.

## 2015-04-18 NOTE — Discharge Instructions (Signed)
Paracentesis, Care After °Refer to this sheet in the next few weeks. These instructions provide you with information about caring for yourself after your procedure. Your health care provider may also give you more specific instructions. Your treatment has been planned according to current medical practices, but problems sometimes occur. Call your health care provider if you have any problems or questions after your procedure. °WHAT TO EXPECT AFTER THE PROCEDURE °After your procedure, it is common to have a small amount of clear fluid coming from the puncture site. °HOME CARE INSTRUCTIONS °· Return to your normal activities as told by your health care provider. Ask your health care provider what activities are safe for you. °· Take over-the-counter and prescription medicines only as told by your health care provider. °· Do not take baths, swim, or use a hot tub until your health care provider approves. °· Follow instructions from your health care provider about: °¨ How to take care of your puncture site. °¨ When and how you should change your bandage (dressing). °¨ When you should remove your dressing. °· Check your puncture area every day signs of infection. Watch for: °¨ Redness, swelling, or pain. °¨ Fluid, blood, or pus. °· Keep all follow-up visits as told by your health care provider. This is important. °SEEK MEDICAL CARE IF: °· You have redness, swelling, or pain at your puncture site. °· You start to have more clear fluid coming from your puncture site. °· You have blood or pus coming from your puncture site. °· You have chills. °· You have a fever. °SEEK IMMEDIATE MEDICAL CARE IF: °· You develop chest pain or shortness of breath. °· You develop increasing pain, discomfort, or swelling in your abdomen. °· You feel dizzy or light-headed or you pass out. °  °This information is not intended to replace advice given to you by your health care provider. Make sure you discuss any questions you have with your health  care provider. °  °Document Released: 08/13/2014 Document Reviewed: 08/13/2014 °Elsevier Interactive Patient Education ©2016 [employer] ° °

## 2015-04-23 ENCOUNTER — Other Ambulatory Visit: Payer: Self-pay | Admitting: Family Medicine

## 2015-04-23 ENCOUNTER — Telehealth: Payer: Self-pay

## 2015-04-23 MED ORDER — ALPRAZOLAM 0.5 MG PO TABS: ORAL_TABLET | ORAL | Status: AC

## 2015-04-23 NOTE — Telephone Encounter (Signed)
Rx changed and printed.

## 2015-04-23 NOTE — Telephone Encounter (Signed)
I'm sorry she is having such a tough time.  I am concerning about increasing to 1 mg as it will increase her fall risk. I would be more comfortable increasing dose to xanax 0.5 mg four times daily as needed for anxiety.

## 2015-04-23 NOTE — Telephone Encounter (Signed)
Spoke to pt and advised per Dr [name]. Pt is agreeable to new instruction and is needing a refill

## 2015-04-23 NOTE — Telephone Encounter (Signed)
Pt left v/m requesting increasing xanax strength to 1 mg to [facility]; pt having difficult time now;pt going to dialysis 3 x a week and having fluid drawn off stomach. Pt request cb. Last seen by Dr [name] for f/u appt on 10/21/14.

## 2015-04-23 NOTE — Telephone Encounter (Signed)
Spoke to pt and advised new Rx faxed to pharmacy

## 2015-04-24 ENCOUNTER — Telehealth: Payer: Self-pay | Admitting: Internal Medicine

## 2015-04-24 NOTE — Telephone Encounter (Signed)
Called to speak with pt. I was hung up on. Called back and had to [facility] x1

## 2015-04-24 NOTE — Telephone Encounter (Signed)
Pt  Returning call.Brandi Cline ° °

## 2015-04-24 NOTE — Telephone Encounter (Signed)
Spoke with pt, states she just came back from dialysis-was told that she has bad b/l crackles.  Pt c/o nonprod cough.  Denies mucus production, chest pain, fever.   Finished augmentin and prednisone 1 week ago for bronchitis.   Pt uses [facility].   Pt has an appt on Monday with CY.  Requesting recs between then and now.   CY please advise on further recs.  Thanks.

## 2015-04-24 NOTE — Telephone Encounter (Signed)
Unless she finds she is getting sick over the weekend, I would prefer to wait until I can see her on Monday.

## 2015-04-24 NOTE — Telephone Encounter (Signed)
Called spoke with pt. She scheduled sooner appt with CDY tomorrow at 10:30. Nothing further needed

## 2015-04-25 ENCOUNTER — Encounter: Payer: Self-pay | Admitting: Internal Medicine

## 2015-04-25 ENCOUNTER — Ambulatory Visit (INDEPENDENT_AMBULATORY_CARE_PROVIDER_SITE_OTHER)
Admission: RE | Admit: 2015-04-25 | Discharge: 2015-04-25 | Disposition: A | Discharge status: Home / Self Care | Payer: Medicare HMO | Source: Ambulatory Visit | Attending: Internal Medicine | Admitting: Internal Medicine

## 2015-04-25 ENCOUNTER — Ambulatory Visit (INDEPENDENT_AMBULATORY_CARE_PROVIDER_SITE_OTHER): Payer: Medicare HMO | Admitting: Internal Medicine

## 2015-04-25 VITALS — BP 142/62 | HR 83 | Ht 65.0 in | Wt 138.8 lb

## 2015-04-25 DIAGNOSIS — N189 Chronic kidney disease, unspecified: Secondary | ICD-10-CM

## 2015-04-25 DIAGNOSIS — J9611 Chronic respiratory failure with hypoxia: Secondary | ICD-10-CM

## 2015-04-25 DIAGNOSIS — N179 Acute kidney failure, unspecified: Secondary | ICD-10-CM

## 2015-04-25 DIAGNOSIS — J449 Chronic obstructive pulmonary disease, unspecified: Secondary | ICD-10-CM

## 2015-04-25 DIAGNOSIS — J441 Chronic obstructive pulmonary disease with (acute) exacerbation: Secondary | ICD-10-CM

## 2015-04-25 DIAGNOSIS — K7469 Other cirrhosis of liver: Secondary | ICD-10-CM

## 2015-04-25 DIAGNOSIS — R0789 Other chest pain: Secondary | ICD-10-CM

## 2015-04-25 MED ORDER — AZITHROMYCIN 250 MG PO TABS: ORAL_TABLET | ORAL | Status: DC

## 2015-04-25 NOTE — Patient Instructions (Addendum)
Script for Z pak sent to drug store  Order- CXR with left rib details     Dx acute exacerbation oc COPD  Sample Incruse inhaler      Inhale 1 puff once daily around noon  For the left back pain try bracing with a pillow when you cough  For the stufffy ear try otc Sudafed 6-hour, from the pharmacist

## 2015-04-25 NOTE — Assessment & Plan Note (Signed)
Ascites represents a considerable additional impediment to her breathing comfortable due to weight retention and chronic of her diaphragms

## 2015-04-25 NOTE — Assessment & Plan Note (Signed)
She remains dependent on continuous high flow oxygen. Oxygen need is slowly creeping up.

## 2015-04-25 NOTE — Assessment & Plan Note (Signed)
Dialysis brings issues of relatively rapid fluid changes and potential increased hypoxemia during dialysis itself

## 2015-04-25 NOTE — Assessment & Plan Note (Signed)
Probable tussive left rib fracture which should respond to bracing, pain meds and time Plan-left rib x-ray details

## 2015-04-25 NOTE — Assessment & Plan Note (Signed)
Acute bronchitic exacerbation now Plan-sample iIncruse Ellipta inhaler, Zpak, CXR

## 2015-04-25 NOTE — Progress Notes (Signed)
Subjective:    Patient ID: Brandi Cline, female    DOB: 10-09-1930, 82 y.o.   MRN: [id number] Acute visit- Dr Shelle Iron 09/28/11- The patient comes in today for an acute sick visit.  She has known significant COPD, and is usually followed by Dr. [name].  She gives a 2 to three-day history of increasing shortness of breath, and this culminated in severe shortness of breath this morning.  She is currently a little better after using her morning bronchodilators and her neb treatment.  She has no significant cough, mucus, or congestion.  She does feel a chest heaviness, especially on the left side.  She has a history of coronary disease with stents, but thinks that chest discomfort is different from her usual angina.  Surprisingly, her oxygen saturations today are excellent.  12/06/11 - 80 yoF followed for COPD, complicated by allergic rhinitis, GERD, Crohns Disease, CAD/ stents Patient states had pneumonia/ hosp/ heart cath( ok w/ no new stents) in June. States breathing is a little worse since last office visit. States wears oxygen on 2L as needed. c/o sob, wheezing, and chest tightness every now and then. Denies chest pain and cough. Occasional scant clear mucus. Denies chest pain. Notices dyspnea mainly with exertion, worse in humid weather. CXR 10/08/11-reviewed with her IMPRESSION:  COPD/chronic changes. No active disease.  Original Report Authenticated By: Brandi Chime, M.D.   03/17/12- 80 yoF followed for COPD, complicated by allergic rhinitis, GERD, Crohns Disease, CAD/ stents FOLLOWS FOR: feels like symibcort and Spiriva not helping anymore-would like something different and cheaper Sister here 2 URI's since August treated by PCP. Needed prednisone.  Feels Symbicort wear off before 12 hour next dose. Wearing O2 more and using neb twice daily.  Little cough- scant clear mucus. Aware of angina despite isorbide. Feet swell- varies, not new or progressive. CXR 10/07/11- COPD  04/27/12-  81 yoF  followed for COPD, complicated by allergic rhinitis, GERD, Crohns Disease, CAD/ stents FOLLOWS FOR: Increased SOB today-unsure of cause(used nebulizer before OV-not much  difference), did not hear any wheezing. Denies any cough or chest congestion Often feels tight at night. Aware of increased fluid retention occasionally. Denies chest pain, infection, wheeze or cough. Last visit we gave sample Breo ellipta, which she blames for fluid retention. Continues oxygen 2 L/Advanced for sleep and as needed  12/19/12- 81 yoF former smoker followed for COPD, complicated by allergic rhinitis, GERD, Crohns Disease, CAD/ stents     Family here. ACUTE:  Increased SOB, tightness that travels around to back and some wheezing.  Sore throat in am and  scratchy all day Continues oxygen 3 L/Advanced for sleep and as needed. She has been more dyspneic over past year. We reviewed Chest CT from the spring, noting it was done because of dyspnea then. Hosp 3/26-07/12/12 for AECOPD with NAD on CXR.  Now 3-4 weeks variable hoarseness- on magic mouthwash and prednisone taper. Some difficulty swallowing.  No cough, scant deep yellow sputum, some tight. No pain, blood, fever, palpitation. Easy DOE needing 3LO2 rest, 4-5L exertion in home. CT chest 07/05/12 IMPRESSION:  . Stable hyperinflation and chronic interstitial prominence. Again  noted left basilar scarring. No acute infiltrate or pulmonary  edema.  Original Report Authenticated By: Brandi Mead, M.D. CXR 07/10/12 IMPRESSION:  No acute cardiopulmonary abnormality seen. Chronic findings as  described above.  Original Report Authenticated By: Brandi Raider., M.D.  01/30/13- 28 yoF former smoker followed for COPD, complicated by allergic rhinitis, GERD, Crohns Disease, CAD/ stents  Family here. FOLLOWS FOR: "weak as water"; swelling in feet, staying tired. Good and bad days Weight gain 10 pounds in the past month. Ankles swell. Little cough, scant yellow. Pain mid  thoracic spine. Continues O2 4L/ Advanced. Theophylline was no help and caused palpitations, so quit. Last prednisone 2 weeks ago. ENT/Dr. Jearld Cline saw her in September and diagnosed esophageal reflux and thrush. He doubled her acid blocker.  03/20/13- 26 yoF former smoker followed for COPD, complicated by allergic rhinitis, GERD, Crohns Disease, CAD/ stents   Daughter here FOLLOWS FOR: Breathing is unchanged. Reports SOB and coughing. Denies chest tightness or wheezing.   Dr. Kevan Cline is treating with Lasix. She is off of prednisone. Morning cough with clear mucus. Using Flutter device for pulmonary toilet. Medications discussed. Hosp for AECOPD 05/13/12- 05/15/12 CXR 01/30/13 IMPRESSION:  Bibasilar linear opacities, consistent with atelectasis or scarring.  Emphysematous changes.  Electronically Signed  By: Brandi Dilling M.D.  On: 01/30/2013 16:54  07/03/13- 73 yoF former smoker followed for COPD, complicated by allergic rhinitis, GERD, Crohns Disease, CAD/ stents   Daughter here FOLLOWS FOR: Symbicort works well for patient about 4-5 hours and then uses rescue inhaler; using neb treatments as needed; but unsure if Spiriva is actually helping. Wonders if she can try [name] again as she has had past 2 days without any side effects. Now finishing prednisone and Z-Pak from primary physician. Denies any benefit from [facility]. Using either nebulizer or Proair once or twice daily.  09/18/13- 68 yoF former smoker followed for COPD, complicated by allergic rhinitis, GERD, Crohns Disease, CAD/ stents   Daughter here FOLLOWS FOR: Pt states breathing has worsened since last OV. Pt states the theophyillne isnt helping. Pt states she has an increase in SOB. C/o mild productive cough with clear mucus and chest tightness with deep breaths.   10/30/13- 82 yoF former smoker followed for COPD/ E, complicated by allergic rhinitis, GERD, Crohns Disease, CAD/ stents   Daughter here FOLLOWS FOR: Using up to 5l/m cont  at home; having a hard time breathing-heat gets her worse.  CT chest 09/19/13 IMPRESSION:  No evidence of significant pulmonary embolus. Extensive  emphysematous changes in the lungs.  Electronically Signed  By: Burman Nieves M.D.  On: 09/19/2013 22:10  01/01/14- 82 yoF former smoker followed for COPD/ E, complicated by allergic rhinitis, GERD, Crohns Disease, CAD/ stents   Daughter here FOLLOWS FOR: continues ot use 5L/M cont at home and 3L/m pulse when out; DME is [facility]. Could tell that Anoro helped better than Symbicort. Anoro is not on her Formulary list through insurance. Never really cleared the cough and yellow sputum after bronchitis in December 05/02/14- 83 yoF former smoker followed for COPD/ E, complicated by allergic rhinitis, GERD, Crohns Disease, CAD/ stents   Daughter here  FOLLOWS FOR: has noticed she has to wear O2 more during the day at 5.5L now.  Pt would like to discuss other options for meds as she feels the Anoro  and albuterol is no longer holding her breathing stable enough.  Asks help for a tier reduction on Symbicort. I suggested we try alternatives first. She wants medications that last longer between doses and feels her heart rhythm is controlled.. She wanted to try Symbicort 3 times daily. CXR 04/10/14 IMPRESSION: 1. Acute cardiopulmonary disease.- should read "NO acute pulmonary disease  -CDY 2. Pleural parenchymal scarring . Electronically Signed  By: Maisie Fus Register  On: 04/10/2014 11:53  07/01/14- 28 yoF former smoker followed for COPD/ E, complicated by  allergic rhinitis, GERD, Crohns Disease, CAD/ stents   Daughter here FOLLOW FOR:  COPD; lot of tightness in chest, coughing up a lot of clear mucus, sometimes yellow.  having to use rescue inhaler daily Hard cough x 3-4 days. No fever.  09/02/14- 23 yoF former smoker followed for COPD/ E, complicated by allergic rhinitis, GERD, Crohns Disease, CAD/ stents   Daughter here Reports: medication refill and wants  to stick with the Brovana. Prescription order is coming through [facility]. Also has an Anoro inhaler which I gave in an effort to stabilize her bronchodilator control around-the-clock. She has had 2 episodes that sound like self-limited palpitations, which she attributes to Anoro. Neither albuterol rescue inhaler or nebulizer with albuterol helped her. She does use Flutter and Incentive Spirometer daily. O2 6L [employer] with home concentrator, 3 L portable.  10/03/14- 29 yoF former smoker followed for COPD/ E, complicated by allergic rhinitis, GERD, Crohns Disease, CAD/ stents, chronic liver disease   Daughter here FOLLOWS FOR: Pt feels she needs to increase her O2 to 4L/M when ambulating and 3L/M at rest. DME is [facility]. Pt states she "loved" the Brovana nebulizer tx's and sprivia respimat-but has noticed swelling in feet, ankles, and going up the leg. Weight down 1 lb since last ov 5/23. Platelets 94 K in March. Bilateral ankle edema left greater than right in the past week with tight feeling but no real pain. Reports Spiriva no help. Questions if Brovana nebulizer solution is causing the edema. No bleeding. Not clear why platelet count was low in March with liver enzymes increased.  01/06/15- 45 yoF former smoker followed for COPD/ E, complicated by allergic rhinitis, GERD, Crohns Disease, CAD/ stents, chronic liver disease   Daughter here O2 6L [employer] with home concentrator, 3 L portable. FOLLOWS FOR Pt feels the need to increase her O2. Pt states that she needs to use 7L to feel that she is able to catch her breath. Pt still using Brovana and spiriva respimat.  We discussed impact of anemia for which she is being treated. Her hemoglobin of 9 is low enough to begin contributing to dyspnea. [employer], therapeutic form completed for her.  04/25/2015-82 year old female former smoker followed for COPD/ E, complicated by allergic rhinitis, GERD, Crohns Disease, CAD/ stents, chronic liver disease/  cirrhosis/ascites, ESRD/dialysis   Daughters here O2 7 L home/4 L portable/[employer] FOLLOWS FOR:Pt states from 2-6pm; albuterol does not help,brovana lasts about 8 hours. Pt states she is having left sided upper back pain. Pt would like to have something to help with breathing during the 2-6pm hours of the day. Now dealing with cirrhotic liver disease she attributes to her medications, and end-stage renal disease on dialysis  Again complains that her inhalers don't help overwhelming sense of fatigue/dyspnea usually felt in early afternoon Complains of cough 2 or 3 weeks with thick green sputum but no distinct fever or chills. In the last week she has had a tussive left posterior rib pain was treated with prednisone and then amoxicillin/Augmentin. Left ear stops up and pops She had 3 L paracentesis recently but still has fluid retention with ankle edema CXR 04/11/2015 IMPRESSION: COPD without acute abnormality. Electronically Signed  By: Alcide Clever M.D.  On: 04/11/2015 08:32  ROS-see HPI Constitutional:   No-   weight loss, night sweats, fevers, chills, fatigue, lassitude. HEENT:   No-  headaches, +difficulty swallowing, tooth/dental problems, +sore throat,       No-  sneezing, itching, ear ache, nasal congestion, post nasal  drip,  CV:  No-   chest pain, orthopnea, PND, +swelling in lower extremities, anasarca, dizziness, +palpitations Resp: +  shortness of breath with exertion or at rest.             +productive cough, no- non-productive cough,  No- coughing up of blood.              No- change in color of mucus.  No- wheezing.   Skin: No-rash or lesions. GI:  + heartburn, indigestion, no-abdominal pain, nausea, vomiting,  GU:  MS:  No-   joint pain or swelling.  + back pain Neuro-     nothing unusual Psych:  No- change in mood or affect. No depression or anxiety.  No memory loss.  Objective:  OBJ- Physical Exam General- Alert, Oriented, Affect-appropriate, Distress- none acute,  wheelchair, O2  4L portable  Skin- rash-none, lesions- none, excoriation- none Lymphadenopathy- none Head- atraumatic            Eyes- Gross vision intact, PERRLA, conjunctivae and secretions clear            Ears- +Hearing aids            Nose- Clear, no-Septal dev, mucus, polyps, erosion, perforation             Throat- Mallampati II , mucosa clear , drainage- none, tonsils- atrophic.  Neck- flexible , trachea midline, no stridor , thyroid nl, carotid no bruit Chest - symmetrical excursion , unlabored           Heart/CV- RRR , no murmur , no gallop  , no rub, nl s1 s2                           - JVD-+ 1, edema +2 R>L, stasis changes- none, varices- none           Lung- decreased breath sounds, + few crackles, wheeze + trace,                   cough-none ,  dullness-none, rub-none           Chest wall-  Abd-  Br/ Gen/ Rectal- Not done, not indicated Extrem- cyanosis- none, clubbing, none, atrophy- none, strength- nl.               Neuro- grossly intact to observation  Assessment & Plan:

## 2015-04-28 ENCOUNTER — Ambulatory Visit: Payer: Medicare HMO | Admitting: Internal Medicine

## 2015-04-29 ENCOUNTER — Other Ambulatory Visit: Payer: Self-pay | Admitting: Nephrology

## 2015-04-29 DIAGNOSIS — K7460 Unspecified cirrhosis of liver: Secondary | ICD-10-CM

## 2015-04-29 DIAGNOSIS — R188 Other ascites: Secondary | ICD-10-CM

## 2015-05-03 ENCOUNTER — Emergency Department: Payer: Medicare HMO

## 2015-05-03 ENCOUNTER — Encounter: Payer: Self-pay | Admitting: Emergency Medicine

## 2015-05-03 ENCOUNTER — Inpatient Hospital Stay: Payer: Medicare HMO

## 2015-05-03 ENCOUNTER — Inpatient Hospital Stay
Admission: EM | Admit: 2015-05-03 | Discharge: 2015-05-04 | DRG: 689 | Disposition: A | Discharge status: Home Health | Payer: Medicare HMO | Attending: Internal Medicine | Admitting: Internal Medicine

## 2015-05-03 DIAGNOSIS — I2510 Atherosclerotic heart disease of native coronary artery without angina pectoris: Secondary | ICD-10-CM | POA: Diagnosis present

## 2015-05-03 DIAGNOSIS — F419 Anxiety disorder, unspecified: Secondary | ICD-10-CM | POA: Diagnosis present

## 2015-05-03 DIAGNOSIS — Z91011 Allergy to milk products: Secondary | ICD-10-CM | POA: Diagnosis not present

## 2015-05-03 DIAGNOSIS — N390 Urinary tract infection, site not specified: Secondary | ICD-10-CM | POA: Diagnosis present

## 2015-05-03 DIAGNOSIS — K7460 Unspecified cirrhosis of liver: Secondary | ICD-10-CM | POA: Diagnosis present

## 2015-05-03 DIAGNOSIS — R531 Weakness: Secondary | ICD-10-CM

## 2015-05-03 DIAGNOSIS — N186 End stage renal disease: Secondary | ICD-10-CM | POA: Diagnosis present

## 2015-05-03 DIAGNOSIS — Z79899 Other long term (current) drug therapy: Secondary | ICD-10-CM | POA: Diagnosis not present

## 2015-05-03 DIAGNOSIS — Z885 Allergy status to narcotic agent status: Secondary | ICD-10-CM

## 2015-05-03 DIAGNOSIS — M13849 Other specified arthritis, unspecified hand: Secondary | ICD-10-CM | POA: Diagnosis present

## 2015-05-03 DIAGNOSIS — E785 Hyperlipidemia, unspecified: Secondary | ICD-10-CM | POA: Diagnosis present

## 2015-05-03 DIAGNOSIS — W19XXXA Unspecified fall, initial encounter: Secondary | ICD-10-CM | POA: Diagnosis present

## 2015-05-03 DIAGNOSIS — S2232XA Fracture of one rib, left side, initial encounter for closed fracture: Secondary | ICD-10-CM | POA: Diagnosis present

## 2015-05-03 DIAGNOSIS — H9192 Unspecified hearing loss, left ear: Secondary | ICD-10-CM | POA: Diagnosis present

## 2015-05-03 DIAGNOSIS — K5090 Crohn's disease, unspecified, without complications: Secondary | ICD-10-CM | POA: Diagnosis present

## 2015-05-03 DIAGNOSIS — Z87891 Personal history of nicotine dependence: Secondary | ICD-10-CM | POA: Diagnosis not present

## 2015-05-03 DIAGNOSIS — Z886 Allergy status to analgesic agent status: Secondary | ICD-10-CM | POA: Diagnosis not present

## 2015-05-03 DIAGNOSIS — Z955 Presence of coronary angioplasty implant and graft: Secondary | ICD-10-CM | POA: Diagnosis not present

## 2015-05-03 DIAGNOSIS — Z7951 Long term (current) use of inhaled steroids: Secondary | ICD-10-CM | POA: Diagnosis not present

## 2015-05-03 DIAGNOSIS — Z992 Dependence on renal dialysis: Secondary | ICD-10-CM

## 2015-05-03 DIAGNOSIS — I132 Hypertensive heart and chronic kidney disease with heart failure and with stage 5 chronic kidney disease, or end stage renal disease: Secondary | ICD-10-CM | POA: Diagnosis present

## 2015-05-03 DIAGNOSIS — J449 Chronic obstructive pulmonary disease, unspecified: Secondary | ICD-10-CM | POA: Diagnosis present

## 2015-05-03 DIAGNOSIS — I5032 Chronic diastolic (congestive) heart failure: Secondary | ICD-10-CM | POA: Diagnosis present

## 2015-05-03 DIAGNOSIS — Z888 Allergy status to other drugs, medicaments and biological substances status: Secondary | ICD-10-CM | POA: Diagnosis not present

## 2015-05-03 DIAGNOSIS — G459 Transient cerebral ischemic attack, unspecified: Secondary | ICD-10-CM | POA: Diagnosis not present

## 2015-05-03 DIAGNOSIS — L03116 Cellulitis of left lower limb: Secondary | ICD-10-CM | POA: Diagnosis present

## 2015-05-03 DIAGNOSIS — K219 Gastro-esophageal reflux disease without esophagitis: Secondary | ICD-10-CM | POA: Diagnosis present

## 2015-05-03 DIAGNOSIS — Z881 Allergy status to other antibiotic agents status: Secondary | ICD-10-CM

## 2015-05-03 DIAGNOSIS — N3000 Acute cystitis without hematuria: Principal | ICD-10-CM | POA: Diagnosis present

## 2015-05-03 LAB — CBC
HEMATOCRIT: 35.2 % (ref 35.0–47.0)
HEMOGLOBIN: 11.1 g/dL — AB (ref 12.0–16.0)
MCH: 27.6 pg (ref 26.0–34.0)
MCHC: 31.6 g/dL — AB (ref 32.0–36.0)
MCV: 87.2 fL (ref 80.0–100.0)
Platelets: 106 10*3/uL — ABNORMAL LOW (ref 150–440)
RBC: 4.03 MIL/uL (ref 3.80–5.20)
RDW: 20.4 % — ABNORMAL HIGH (ref 11.5–14.5)
WBC: 8.7 10*3/uL (ref 3.6–11.0)

## 2015-05-03 LAB — DIFFERENTIAL
BASOS ABS: 0 10*3/uL (ref 0–0.1)
Basophils Relative: 1 %
Eosinophils Absolute: 0.1 10*3/uL (ref 0–0.7)
Eosinophils Relative: 1 %
LYMPHS ABS: 0.7 10*3/uL — AB (ref 1.0–3.6)
Lymphocytes Relative: 9 %
MONO ABS: 0.6 10*3/uL (ref 0.2–0.9)
MONOS PCT: 7 %
NEUTROS ABS: 7.2 10*3/uL — AB (ref 1.4–6.5)
Neutrophils Relative %: 82 %

## 2015-05-03 LAB — COMPREHENSIVE METABOLIC PANEL
ALT: 23 U/L (ref 14–54)
AST: 21 U/L (ref 15–41)
Albumin: 2.4 g/dL — ABNORMAL LOW (ref 3.5–5.0)
Alkaline Phosphatase: 236 U/L — ABNORMAL HIGH (ref 38–126)
Anion gap: 11 (ref 5–15)
BILIRUBIN TOTAL: 1.8 mg/dL — AB (ref 0.3–1.2)
BUN: 28 mg/dL — AB (ref 6–20)
CALCIUM: 8.3 mg/dL — AB (ref 8.9–10.3)
CO2: 27 mmol/L (ref 22–32)
CREATININE: 5.09 mg/dL — AB (ref 0.44–1.00)
Chloride: 96 mmol/L — ABNORMAL LOW (ref 101–111)
GFR, EST AFRICAN AMERICAN: 8 mL/min — AB (ref >60)
GFR, EST NON AFRICAN AMERICAN: 7 mL/min — AB (ref >60)
Glucose, Bld: 109 mg/dL — ABNORMAL HIGH (ref 65–99)
Potassium: 3.6 mmol/L (ref 3.5–5.1)
Sodium: 134 mmol/L — ABNORMAL LOW (ref 135–145)
TOTAL PROTEIN: 5.6 g/dL — AB (ref 6.5–8.1)

## 2015-05-03 LAB — URINALYSIS COMPLETE WITH MICROSCOPIC (ARMC ONLY)
Bacteria, UA: NONE SEEN
Glucose, UA: NEGATIVE mg/dL
Hgb urine dipstick: NEGATIVE
KETONES UR: NEGATIVE mg/dL
Nitrite: NEGATIVE
PH: 5 (ref 5.0–8.0)
PROTEIN: 100 mg/dL — AB
Specific Gravity, Urine: 1.018 (ref 1.005–1.030)

## 2015-05-03 LAB — APTT: APTT: 40 s — AB (ref 24–36)

## 2015-05-03 LAB — PROTIME-INR
INR: 2.54
Prothrombin Time: 27 seconds — ABNORMAL HIGH (ref 11.4–15.0)

## 2015-05-03 LAB — AMMONIA: Ammonia: 40 umol/L — ABNORMAL HIGH (ref 9–35)

## 2015-05-03 LAB — TROPONIN I

## 2015-05-03 MED ORDER — PANTOPRAZOLE SODIUM 40 MG PO TBEC
40.0000 mg | DELAYED_RELEASE_TABLET | Freq: Every day | ORAL | Status: DC
  Administered 2015-05-04: 40 mg via ORAL
  Filled 2015-05-03: qty 1

## 2015-05-03 MED ORDER — VITAMIN D 1000 UNITS PO TABS
2000.0000 [IU] | ORAL_TABLET | Freq: Every day | ORAL | Status: DC
  Administered 2015-05-04: 2000 [IU] via ORAL
  Filled 2015-05-03: qty 2

## 2015-05-03 MED ORDER — ACETAMINOPHEN 325 MG PO TABS: 650.0000 mg | ORAL_TABLET | Freq: Four times a day (QID) | ORAL | Status: DC | PRN

## 2015-05-03 MED ORDER — DOCUSATE SODIUM 100 MG PO CAPS
100.0000 mg | ORAL_CAPSULE | Freq: Two times a day (BID) | ORAL | Status: DC
  Administered 2015-05-03: 100 mg via ORAL
  Filled 2015-05-03 (×2): qty 1

## 2015-05-03 MED ORDER — TRAZODONE HCL 50 MG PO TABS: 25.0000 mg | ORAL_TABLET | Freq: Every evening | ORAL | Status: DC | PRN

## 2015-05-03 MED ORDER — ISOSORBIDE MONONITRATE ER 30 MG PO TB24
60.0000 mg | ORAL_TABLET | Freq: Every day | ORAL | Status: DC
  Administered 2015-05-04: 60 mg via ORAL
  Filled 2015-05-03: qty 1
  Filled 2015-05-03: qty 2
  Filled 2015-05-03: qty 1

## 2015-05-03 MED ORDER — SEVELAMER CARBONATE 800 MG PO TABS
2400.0000 mg | ORAL_TABLET | Freq: Three times a day (TID) | ORAL | Status: DC
  Administered 2015-05-04: 2400 mg via ORAL
  Filled 2015-05-03: qty 3

## 2015-05-03 MED ORDER — DEXTROSE 5 % IV SOLN
1.0000 g | INTRAVENOUS | Status: DC
  Administered 2015-05-04: 1 g via INTRAVENOUS
  Filled 2015-05-03 (×2): qty 10

## 2015-05-03 MED ORDER — CETYLPYRIDINIUM CHLORIDE 0.05 % MT LIQD
7.0000 mL | Freq: Two times a day (BID) | OROMUCOSAL | Status: DC
  Administered 2015-05-03 – 2015-05-04 (×2): 7 mL via OROMUCOSAL

## 2015-05-03 MED ORDER — ALBUTEROL SULFATE (2.5 MG/3ML) 0.083% IN NEBU
2.5000 mg | INHALATION_SOLUTION | RESPIRATORY_TRACT | Status: DC | PRN
  Administered 2015-05-03: 2.5 mg via RESPIRATORY_TRACT
  Filled 2015-05-03: qty 3

## 2015-05-03 MED ORDER — BISACODYL 5 MG PO TBEC
5.0000 mg | DELAYED_RELEASE_TABLET | Freq: Every day | ORAL | Status: DC | PRN
Start: 2015-05-03 — End: 2015-05-04
  Filled 2015-05-03: qty 1

## 2015-05-03 MED ORDER — ACETAMINOPHEN 650 MG RE SUPP: 650.0000 mg | Freq: Four times a day (QID) | RECTAL | Status: DC | PRN

## 2015-05-03 MED ORDER — DEXTROSE 5 % IV SOLN
1.0000 g | Freq: Once | INTRAVENOUS | Status: DC
  Filled 2015-05-03: qty 10

## 2015-05-03 MED ORDER — ONDANSETRON HCL 4 MG/2ML IJ SOLN: 4.0000 mg | Freq: Four times a day (QID) | INTRAMUSCULAR | Status: DC | PRN

## 2015-05-03 MED ORDER — MAGNESIUM OXIDE 400 (241.3 MG) MG PO TABS
400.0000 mg | ORAL_TABLET | Freq: Every day | ORAL | Status: DC
  Administered 2015-05-04: 400 mg via ORAL
  Filled 2015-05-03: qty 1

## 2015-05-03 MED ORDER — ALBUTEROL SULFATE (2.5 MG/3ML) 0.083% IN NEBU: 2.5000 mg | INHALATION_SOLUTION | Freq: Four times a day (QID) | RESPIRATORY_TRACT | Status: DC | PRN

## 2015-05-03 MED ORDER — ONDANSETRON HCL 4 MG PO TABS: 4.0000 mg | ORAL_TABLET | Freq: Four times a day (QID) | ORAL | Status: DC | PRN

## 2015-05-03 MED ORDER — ARFORMOTEROL TARTRATE 15 MCG/2ML IN NEBU
15.0000 ug | INHALATION_SOLUTION | Freq: Two times a day (BID) | RESPIRATORY_TRACT | Status: DC
  Administered 2015-05-03 – 2015-05-04 (×2): 15 ug via RESPIRATORY_TRACT
  Filled 2015-05-03 (×3): qty 2

## 2015-05-03 MED ORDER — ALPRAZOLAM 0.5 MG PO TABS
0.5000 mg | ORAL_TABLET | Freq: Three times a day (TID) | ORAL | Status: DC | PRN
  Administered 2015-05-04: 0.5 mg via ORAL
  Filled 2015-05-03: qty 1

## 2015-05-03 NOTE — ED Notes (Signed)
in procedure

## 2015-05-03 NOTE — ED Provider Notes (Addendum)
[facility] Emergency Department Provider Note  ____________________________________________  Time seen: Approximately 1245 PM  I have reviewed the triage vital signs and the nursing notes.   HISTORY  Chief Complaint Weakness    HPI Brandi Cline is a 82 y.o. female with a history of cirrhosisand renal failure who is presenting today with right-sided weakness that she first noticed at about 7 AM this morning. She said that she felt normal when she went to sleep last night but then woke up very weak. She normally lives independently and has not needed any assistance walking until yesterday when she started using a cane. She denies any pain at this time. She says that the weakness is worse in her right arm and right leg. The patient was supposed to have dialysis this morning but was not able to go secondary to her illness today. She also says that she has intermittent double vision but does not have any visual disturbances right now and her vision is at her baseline.   Past Medical History  Diagnosis Date  . Dyspnea   . Esophageal reflux   . CAD (coronary artery disease)   . Hypertension   . Crohn's disease (HCC)   . Allergic rhinitis   . COPD (chronic obstructive pulmonary disease) (HCC)   . History of kidney stones   . History of shingles   . Trigeminal neuralgia   . Family history of anesthesia complication     " SISTER HAD BAD FEELINGS "  . Anginal pain (HCC)   . Anxiety     ' JUST FOR MY BREATHING "  . Arthritis     mild in hands  . Hyperlipidemia   . CKD (chronic kidney disease)   . Kidney stones   . CHF (congestive heart failure) (HCC)   . Anemia     Patient Active Problem List   Diagnosis Date Noted  . Left-sided chest wall pain 04/25/2015  . Cough 04/10/2015  . Hearing loss in left ear 04/02/2015  . ETD (eustachian tube dysfunction) 02/25/2015  . Abnormal chest CT   . Iron deficiency anemia, unspecified   . Heme + stool   . Intestinal  bypass or anastomosis status   . Acute on chronic renal failure (HCC) 01/22/2015  . Cellulitis of leg, left 01/22/2015  . Acute renal failure (HCC) 01/10/2015  . Leg edema 10/21/2014  . Anemia 10/21/2014  . Abdominal pressure 10/21/2014  . Elevated serum creatinine 10/05/2014  . Thrombocytopenia (HCC) 10/03/2014  . Cystitis 09/07/2014  . Chronic respiratory failure with hypoxia (HCC) 09/03/2014  . Hyperglycemia 06/27/2014  . Elevated liver function tests 06/27/2014  . History of nephrolithiasis 01/10/2014  . Splenic varices 10/25/2013  . Cirrhosis of liver (HCC) 10/03/2013  . CKD (chronic kidney disease), stage II 09/20/2013  . Post herpetic neuralgia 06/13/2013  . Chronic diastolic congestive heart failure, NYHA class 1 (HCC) 04/25/2013  . Essential hypertension 03/21/2007  . Coronary atherosclerosis 03/21/2007  . ALLERGIC RHINITIS 03/21/2007  . COPD mixed type (HCC) 03/21/2007  . Esophageal reflux 03/21/2007  . Regional enteritis [facility]) 03/21/2007    Past Surgical History  Procedure Laterality Date  . Coronary stents    . Lithotripsy    . Ureteral stent placement    . Cholecystectomy    . Total abdominal hysterectomy    . Partial resections large and small bowel for crohn's    . Breast lumps benign    . Left heart catheterization with coronary angiogram N/A 10/28/2011  Procedure: LEFT HEART CATHETERIZATION WITH CORONARY ANGIOGRAM;  Surgeon: Lesleigh Noe, MD;  Location: [facility] CATH LAB;  Service: Cardiovascular;  Laterality: N/A;  . Peripheral vascular catheterization N/A 01/31/2015    Procedure: Dialysis/Perma Catheter Insertion;  Surgeon: Renford Dills, MD;  Location: [facility] INVASIVE CV LAB;  Service: Cardiovascular;  Laterality: N/A;  . Endobronchial ultrasound N/A 02/05/2015    Procedure: ENDOBRONCHIAL ULTRASOUND;  Surgeon: Stephanie Acre, MD;  Location: [facility] ORS;  Service: Cardiopulmonary;  Laterality: N/A;  . Esophagogastroduodenoscopy (egd) with propofol N/A  02/03/2015    Procedure: ESOPHAGOGASTRODUODENOSCOPY (EGD) WITH PROPOFOL;  Surgeon: Midge Minium, MD;  Location: [facility] ENDOSCOPY;  Service: Endoscopy;  Laterality: N/A;  Look into the colon and stomach with a light to look for the source of bleeding.  . Colonoscopy with propofol N/A 02/03/2015    Procedure: COLONOSCOPY WITH PROPOFOL;  Surgeon: Midge Minium, MD;  Location: [facility] ENDOSCOPY;  Service: Endoscopy;  Laterality: N/A;  . Av fistula placement Left 03/28/2015    Procedure: Creation of arteriovenous fistula left arm;  Surgeon: Renford Dills, MD;  Location: [facility] ORS;  Service: Vascular;  Laterality: Left;    Current Outpatient Rx  Name  Route  Sig  Dispense  Refill  . albuterol (PROVENTIL HFA;VENTOLIN HFA) 108 (90 BASE) MCG/ACT inhaler   Inhalation   Inhale 2 puffs into the lungs every 6 (six) hours as needed for wheezing or shortness of breath.   1 Inhaler   11     Ventolin HFA   . albuterol (PROVENTIL) (2.5 MG/3ML) 0.083% nebulizer solution   Nebulization   Take 3 mLs (2.5 mg total) by nebulization every 4 (four) hours as needed. Shortness of breath   75 mL   11   . ALPRAZolam (XANAX) 0.5 MG tablet      1 tab by mouth four times daily as needed for anxiety and or insomnia   120 tablet   0   . arformoterol (BROVANA) 15 MCG/2ML NEBU   Nebulization   Take 2 mLs (15 mcg total) by nebulization 2 (two) times daily.   60 mL   prn   . azithromycin (ZITHROMAX) 250 MG tablet      2 today then one daily   6 each   0   . calcium acetate, Phos Binder, (PHOSLYRA) 667 MG/5ML SOLN   Oral   Take 2,001 mg by mouth 3 (three) times daily with meals.         . Cholecalciferol (VITAMIN D-3) 5000 UNITS TABS   Oral   Take 1 tablet by mouth daily.         . isosorbide mononitrate (IMDUR) 30 MG 24 hr tablet      TAKE TWO TABLETS BY MOUTH DAILY   60 tablet   11   . magnesium oxide (MAG-OX) 400 MG tablet   Oral   Take 400 mg by mouth daily.          . nitroGLYCERIN  (NITROSTAT) 0.4 MG SL tablet   Sublingual   Place 1 tablet (0.4 mg total) under the tongue every 5 (five) minutes as needed. Chest pain   25 tablet   3   . omeprazole (PRILOSEC) 20 MG capsule      TAKE ONE CAPSULE BY MOUTH DAILY   90 capsule   3     Allergies Avelox; Codeine; Theophyllines; Ciprofloxacin; Milk-related compounds; Hydrocodone; Tape; and Lisinopril  Family History  Problem Relation Age of Onset  . Breast cancer Sister   .  COPD Sister   . Stroke Mother   . Prostate cancer Father   . Prostate cancer Brother   . Heart attack Brother     Social History Social History  Substance Use Topics  . Smoking status: Former Smoker -- 1.00 packs/day for 30 years    Types: Cigarettes    Quit date: 04/12/1990  . Smokeless tobacco: Never Used  . Alcohol Use: No    Review of Systems Constitutional: No fever/chills Eyes: No visual changes. ENT: No sore throat. Cardiovascular: Denies chest pain. Respiratory: Patient is on a baseline 4 L nasal cannula oxygen. Has had a cough over the past several weeks and has been treated with azithromycin without resolution. Gastrointestinal: No abdominal pain.  No nausea, no vomiting.  No diarrhea.  No constipation. Genitourinary: Negative for dysuria. Musculoskeletal: Negative for back pain. Skin: Negative for rash. Neurological: Negative for headaches, focal weakness or numbness.  10-point ROS otherwise negative.  ____________________________________________   PHYSICAL EXAM:  VITAL SIGNS: ED Triage Vitals  Enc Vitals Group     BP 05/03/15 1101 119/45 mmHg     Pulse Rate 05/03/15 1101 74     Resp 05/03/15 1101 26     Temp 05/03/15 1101 97.5 F (36.4 C)     Temp Source 05/03/15 1101 Oral     SpO2 05/03/15 1101 94 %     Weight 05/03/15 1101 110 lb (49.896 kg)     Height 05/03/15 1101 5\' 3"  (1.6 m)     Head Cir --      Peak Flow --      Pain Score 05/03/15 1243 0     Pain Loc --      Pain Edu? --      Excl. in GC? --      Constitutional: Alert and oriented. Ill-appearing and in no acute distress. Eyes: Conjunctivae are normal. PERRL. EOMI. Head: Atraumatic. Nose: No congestion/rhinnorhea. Mouth/Throat: Mucous membranes are moist.  Oropharynx non-erythematous. Neck: No stridor.   Cardiovascular: Normal rate, regular rhythm. Grossly normal heart sounds.  Good peripheral circulation. Left upper extremity with fistula with palpable thrill. Respiratory: Normal respiratory effort.  No retractions. Lungs CTAB. Gastrointestinal: Soft and nontender. Distended but nontender. Patient says that she has had one paracentesis and has another scheduled in the near future. No abdominal bruits. No CVA tenderness. Musculoskeletal: No lower extremity tenderness, but with mild to moderate edema bilaterally up to the calves with the right lower extremity being slightly more swollen than the left. The family says that usually she has slightly more swelling on the left than the right.  No joint effusions. Neurologic:  Normal speech and language. No gross focal neurologic deficits are appreciated. 4 out of 5 strength to the right lower extremity. Equal strength to the bilateral upper extremities. Skin:  Skin is warm, dry and intact. No rash noted. Psychiatric: Mood and affect are normal. Speech and behavior are normal.  NIH Stroke Scale   Person Administering Scale: Arelia Longest  Administer stroke scale items in the order listed. Record performance in each category after each subscale exam. Do not go back and change scores. Follow directions provided for each exam technique. Scores should reflect what the patient does, not what the clinician thinks the patient can do. The clinician should record answers while administering the exam and work quickly. Except where indicated, the patient should not be coached (i.e., repeated requests to patient to make a special effort).   1a  Level of consciousness:  0=alert; keenly responsive   1b. LOC questions:  0=Performs both tasks correctly  1c. LOC commands: 0=Performs both tasks correctly  2.  Best Gaze: 0=normal  3.  Visual: 0=No visual loss  4. Facial Palsy: 0=Normal symmetric movement  5a.  Motor left arm: 0=No drift, limb holds 90 (or 45) degrees for full 10 seconds  5b.  Motor right arm: 0=No drift, limb holds 90 (or 45) degrees for full 10 seconds  6a. motor left leg: 0=No drift, limb holds 90 (or 45) degrees for full 10 seconds  6b  Motor right leg:  1=Drift, limb holds 90 (or 45) degrees but drifts down before full 10 seconds: does not hit bed  7. Limb Ataxia: 0=Absent  8.  Sensory: 0=Normal; no sensory loss  9. Best Language:  0=No aphasia, normal  10. Dysarthria: 0=Normal  11. Extinction and Inattention: 0=No abnormality  12. Distal motor function: 0=Normal   Total:   1    ____________________________________________   LABS (all labs ordered are listed, but only abnormal results are displayed)  Labs Reviewed  PROTIME-INR - Abnormal; Notable for the following:    Prothrombin Time 27.0 (*)    All other components within normal limits  APTT - Abnormal; Notable for the following:    aPTT 40 (*)    All other components within normal limits  CBC - Abnormal; Notable for the following:    Hemoglobin 11.1 (*)    MCHC 31.6 (*)    RDW 20.4 (*)    Platelets 106 (*)    All other components within normal limits  DIFFERENTIAL - Abnormal; Notable for the following:    Neutro Abs 7.2 (*)    Lymphs Abs 0.7 (*)    All other components within normal limits  COMPREHENSIVE METABOLIC PANEL - Abnormal; Notable for the following:    Sodium 134 (*)    Chloride 96 (*)    Glucose, Bld 109 (*)    BUN 28 (*)    Creatinine, Ser 5.09 (*)    Calcium 8.3 (*)    Total Protein 5.6 (*)    Albumin 2.4 (*)    Alkaline Phosphatase 236 (*)    Total Bilirubin 1.8 (*)    GFR calc non Af Amer 7 (*)    GFR calc Af Amer 8 (*)    All other components within normal limits   URINALYSIS COMPLETEWITH MICROSCOPIC ([facility] ONLY) - Abnormal; Notable for the following:    Color, Urine AMBER (*)    APPearance TURBID (*)    Bilirubin Urine 1+ (*)    Protein, ur 100 (*)    Leukocytes, UA 3+ (*)    Squamous Epithelial / LPF 0-5 (*)    All other components within normal limits  URINE CULTURE  TROPONIN I  AMMONIA   ____________________________________________  EKG  ED ECG REPORT I, Arelia Longest, the attending physician, personally viewed and interpreted this ECG.   Date: 05/03/2015  EKG Time: 1329  Rate: 89  Rhythm: normal sinus rhythm  Axis: No axis  Intervals:none  ST&T Change: No ST segment elevation or depression. No abnormal T-wave inversion.  ____________________________________________  RADIOLOGY  Acute fracture of the left eighth rib with slight displacement.  No acute finding on CAT scan of the brain. ____________________________________________   PROCEDURES   ____________________________________________   INITIAL IMPRESSION / ASSESSMENT AND PLAN / ED COURSE  Pertinent labs & imaging results that were available during my care of the patient were reviewed by me and  considered in my medical decision making (see chart for details).  ----------------------------------------- 3:37 PM on 05/03/2015 -----------------------------------------  SHEENT found to have urinary tract infection. We will treat with ceftriaxone. However, we'll admit to the hospital secondary to new onset right-sided weakness. CT head negative. Possible this could be more metabolic but unlikely secondary to unilateral weakness. We'll hold aspirin secondary to elevated INR. Explained the lab and imaging results to the patient as well as the family. Signed out to Dr. Judithann Sheen. ____________________________________________   FINAL CLINICAL IMPRESSION(S) / ED DIAGNOSES  UTI. Right-sided weakness. Rib fracture.    Myrna Blazer, MD 05/03/15 1539  Myrna Blazer, MD 05/03/15 [phone]

## 2015-05-03 NOTE — ED Notes (Signed)
Per MRI, pt to be transported there , then a transporter will take patient to in patient floor. Josh, floor nurse, aware of plans during report.

## 2015-05-03 NOTE — H&P (Signed)
[facility] - [place] at [facility]   PATIENT NAME: Brandi Cline    MR#:  [id number]  DATE OF BIRTH:  Sep 22, 1930  DATE OF ADMISSION:  05/03/2015  PRIMARY CARE PHYSICIAN: [name], MD   REQUESTING/REFERRING PHYSICIAN: Dr. Langston Masker  CHIEF COMPLAINT: Right-sided weakness    Chief Complaint  Patient presents with  . Weakness    HISTORY OF PRESENT ILLNESS:  Brandi Cline  is a 82 y.o. female with a known history of liver cirrhosis, end-stage renal disease on hemodialysis brought in by family because of generalized weakness, also intermittent double vision. Patient says that she is feeling while weakness on the right side since this morning. Double vision since yesterday night. Family also noticed that she was having slurred speech. Patient denies shortness of breath, cough, fever. No weakness of the left side. No headache. No fever. Patient recently diagnosed with liver cirrhosis 2 years ago, getting paracentesis done as needed. Family says they don't know why she has cirrhosis of the liver at this time. For hemodialysis today because of weakness. Patient usually goes to dialysis on Tuesday Thursday and Saturday. also has redness in both legs.  PAST MEDICAL HISTORY:   Past Medical History  Diagnosis Date  . Dyspnea   . Esophageal reflux   . CAD (coronary artery disease)   . Hypertension   . Crohn's disease (HCC)   . Allergic rhinitis   . COPD (chronic obstructive pulmonary disease) (HCC)   . History of kidney stones   . History of shingles   . Trigeminal neuralgia   . Family history of anesthesia complication     " SISTER HAD BAD FEELINGS "  . Anginal pain (HCC)   . Anxiety     ' JUST FOR MY BREATHING "  . Arthritis     mild in hands  . Hyperlipidemia   . CKD (chronic kidney disease)   . Kidney stones   . CHF (congestive heart failure) (HCC)   . Anemia     PAST SURGICAL HISTOIRY:   Past Surgical History  Procedure Laterality Date  . Coronary stents     . Lithotripsy    . Ureteral stent placement    . Cholecystectomy    . Total abdominal hysterectomy    . Partial resections large and small bowel for crohn's    . Breast lumps benign    . Left heart catheterization with coronary angiogram N/A 10/28/2011    Procedure: LEFT HEART CATHETERIZATION WITH CORONARY ANGIOGRAM;  Surgeon: Lesleigh Noe, MD;  Location: [facility] CATH LAB;  Service: Cardiovascular;  Laterality: N/A;  . Peripheral vascular catheterization N/A 01/31/2015    Procedure: Dialysis/Perma Catheter Insertion;  Surgeon: Renford Dills, MD;  Location: [facility] INVASIVE CV LAB;  Service: Cardiovascular;  Laterality: N/A;  . Endobronchial ultrasound N/A 02/05/2015    Procedure: ENDOBRONCHIAL ULTRASOUND;  Surgeon: Stephanie Acre, MD;  Location: [facility] ORS;  Service: Cardiopulmonary;  Laterality: N/A;  . Esophagogastroduodenoscopy (egd) with propofol N/A 02/03/2015    Procedure: ESOPHAGOGASTRODUODENOSCOPY (EGD) WITH PROPOFOL;  Surgeon: Midge Minium, MD;  Location: [facility] ENDOSCOPY;  Service: Endoscopy;  Laterality: N/A;  Look into the colon and stomach with a light to look for the source of bleeding.  . Colonoscopy with propofol N/A 02/03/2015    Procedure: COLONOSCOPY WITH PROPOFOL;  Surgeon: Midge Minium, MD;  Location: [facility] ENDOSCOPY;  Service: Endoscopy;  Laterality: N/A;  . Av fistula placement Left 03/28/2015    Procedure: Creation of arteriovenous fistula left arm;  Surgeon: Renford Dills, MD;  Location: [facility] ORS;  Service: Vascular;  Laterality: Left;    SOCIAL HISTORY:   Social History  Substance Use Topics  . Smoking status: Former Smoker -- 1.00 packs/day for 30 years    Types: Cigarettes    Quit date: 04/12/1990  . Smokeless tobacco: Never Used  . Alcohol Use: No    FAMILY HISTORY:   Family History  Problem Relation Age of Onset  . Breast cancer Sister   . COPD Sister   . Stroke Mother   . Prostate cancer Father   . Prostate cancer Brother   . Heart attack Brother      DRUG ALLERGIES:   Allergies  Allergen Reactions  . Avelox [Moxifloxacin Hcl In Nacl] Shortness Of Breath, Swelling and Rash  . Codeine Shortness Of Breath and Other (See Comments)    Couldn't breath  . Theophyllines     Slight increased heart rate  . Ciprofloxacin Other (See Comments)    nervous  . Milk-Related Compounds Other (See Comments)    Whole milk hurts stomach. Not all dairy products do this.  . Hydrocodone Other (See Comments)    "messes with my head."  . Tape     Please use paper tape only  . Lisinopril Rash    REVIEW OF SYSTEMS:  CONSTITUTIONAL:  has some fatigue, weakness. EYES: c/o double vision in both eyes on and off since yesterday. EARS, NOSE, AND THROAT: No tinnitus or ear pain.  RESPIRATORY: No cough, shortness of breath, wheezing or hemoptysis.  CARDIOVASCULAR: No chest pain, orthopnea, edema.  GASTROINTESTINAL: No nausea, vomiting, diarrhea or abdominal pain.  GENITOURINARY: No dysuria, hematuria.  ENDOCRINE: No polyuria, nocturia,  HEMATOLOGY: No anemia, easy bruising or bleeding SKIN: No rash or lesion. MUSCULOSKELETAL: pitting edema of  both legs, redness in both legs more on the right leg.  NEUROLOGIC: No tingling, numbness, weakness.  PSYCHIATRY: No anxiety or depression.   MEDICATIONS AT HOME:   Prior to Admission medications   Medication Sig Start Date End Date Taking? Authorizing Provider  albuterol (PROVENTIL HFA;VENTOLIN HFA) 108 (90 BASE) MCG/ACT inhaler Inhale 2 puffs into the lungs every 6 (six) hours as needed for wheezing or shortness of breath. 10/08/14  Yes Waymon Budge, MD  albuterol (PROVENTIL) (2.5 MG/3ML) 0.083% nebulizer solution Take 3 mLs (2.5 mg total) by nebulization every 4 (four) hours as needed. Shortness of breath 10/03/14  Yes Waymon Budge, MD  ALPRAZolam Prudy Feeler) 0.5 MG tablet 1 tab by mouth four times daily as needed for anxiety and or insomnia 04/23/15  Yes Dianne Dun, MD  arformoterol (BROVANA) 15 MCG/2ML  NEBU Take 2 mLs (15 mcg total) by nebulization 2 (two) times daily. 08/14/14  Yes Waymon Budge, MD  Cholecalciferol (VITAMIN D3) 2000 units capsule Take 2,000 Units by mouth daily.   Yes Historical Provider, MD  magnesium oxide (MAG-OX) 400 MG tablet Take 400 mg by mouth daily.    Yes Historical Provider, MD  omeprazole (PRILOSEC) 20 MG capsule TAKE ONE CAPSULE BY MOUTH DAILY 12/03/14  Yes Dianne Dun, MD  sevelamer carbonate (RENVELA) 800 MG tablet Take 2,400 mg by mouth 3 (three) times daily with meals.   Yes Historical Provider, MD  azithromycin (ZITHROMAX) 250 MG tablet 2 today then one daily 04/25/15   Waymon Budge, MD  isosorbide mononitrate (IMDUR) 30 MG 24 hr tablet TAKE TWO TABLETS BY MOUTH DAILY 08/13/14   Lyn Records, MD  nitroGLYCERIN (NITROSTAT) 0.4  MG SL tablet Place 1 tablet (0.4 mg total) under the tongue every 5 (five) minutes as needed. Chest pain 04/27/13   Lyn Records, MD      VITAL SIGNS:  Blood pressure 116/47, pulse 67, temperature 97.5 F (36.4 C), temperature source Oral, resp. rate 16, height  (1.6 m), weight 49.896 kg (110 lb), SpO2 100 %.  PHYSICAL EXAMINATION:  GENERAL:  82 y.o.-year-old patient lying in the bed with no acute distress.  EYES: Pupils equal, round, reactive to light and accommodation. No scleral icterus. Extraocular muscles intact.  HEENT: Head atraumatic, normocephalic. Oropharynx and nasopharynx clear.  NECK:  Supple, no jugular venous distention. No thyroid enlargement, no tenderness.  LUNGS: Normal breath sounds bilaterally, no wheezing, rales,rhonchi or crepitation. No use of accessory muscles of respiration.  CARDIOVASCULAR: S1, S2 normal. No murmurs, rubs, or gallops.  ABDOMEN: Soft, nontender, nondistended. Bowel sounds present. No organomegaly or mass.  EXTREMITIES: Bilateral extremity edema present. Right leg is more erythematous than the left leg. NEUROLOGIC: Cranial nerves II through XII are intact. Muscle strength 5/5 in all  extremities. Sensation intact. Gait not checked. Slight weakness noted on right side, PSYCHIATRIC: The patient is alert and oriented x 3.  SKIN: No obvious rash, lesion, or ulcer.   LABORATORY PANEL:   CBC  Recent Labs Lab 05/03/15 1210  WBC 8.7  HGB 11.1*  HCT 35.2  PLT 106*   ------------------------------------------------------------------------------------------------------------------  Chemistries   Recent Labs Lab 05/03/15 1210  NA 134*  K 3.6  CL 96*  CO2 27  GLUCOSE 109*  BUN 28*  CREATININE 5.09*  CALCIUM 8.3*  AST 21  ALT 23  ALKPHOS 236*  BILITOT 1.8*   ------------------------------------------------------------------------------------------------------------------  Cardiac Enzymes  Recent Labs Lab 05/03/15 1210  TROPONINI <0.03   ------------------------------------------------------------------------------------------------------------------  RADIOLOGY:  Dg Chest 2 View  05/03/2015  CLINICAL DATA:  Generalized week this is this morning. History of COPD, CHF, dialysis. EXAM: CHEST  2 VIEW COMPARISON:  Chest x-rays dated 04/25/2015 and 04/10/2015. FINDINGS: Heart size is normal. Overall cardiomediastinal silhouette is stable in size and configuration. Right chest wall dialysis catheter appears stable in position with tip overlying the upper SVC. Coarse interstitial markings bilaterally are chronic. No new lung findings seen. No pleural effusion. No pneumothorax. There is a new slightly displaced fracture of the left eighth posterior-lateral rib. This appears acute. Osseous structures otherwise unremarkable. IMPRESSION: 1. Acute fracture of the left eighth rib, posterior-lateral aspect, slightly displaced. 2. No evidence of acute cardiopulmonary abnormality. Electronically Signed   By: Bary Richard M.D.   On: 05/03/2015 13:35   Ct Head Wo Contrast  05/03/2015  CLINICAL DATA:  Weakness with recent fall, initial encounter EXAM: CT HEAD WITHOUT CONTRAST  TECHNIQUE: Contiguous axial images were obtained from the base of the skull through the vertex without intravenous contrast. COMPARISON:  None. FINDINGS: The bony calvarium is intact. Paranasal sinuses and mastoid air cells are well aerated. Very minimal atrophic changes are noted commenced with the patient's given age. No findings to suggest acute hemorrhage, acute infarction or space-occupying mass lesion are noted. Basal ganglia calcifications are seen bilaterally. IMPRESSION: Mild atrophy without acute abnormality. Electronically Signed   By: Alcide Clever M.D.   On: 05/03/2015 11:40    EKG:   Orders placed or performed during the hospital encounter of 05/03/15  . ED EKG  . ED EKG  . ED EKG  . ED EKG  . EKG 12-Lead  . EKG 12-Lead  . EKG 12-Lead  .  EKG 12-Lead  . EKG 12-Lead  . EKG 12-Lead   normal sinus rhythm 70 beats per minute. No ST-T changes.  IMPRESSION AND PLAN:  #1. Right-sided weakness and double vision likely secondary to UTI and the neck to stroke, because of multiple risk factors we are getting more F the brain, ultrasound of carotids and echocardiogram to evaluate for TIA. Unable to give aspirin because of coagulopathy and liver cirrhosis. Obtain fasting lipase. And do neuro checks. #2. UTI: Patient makes the Urine,  even though she is on dialysis: Follow urine cultures, start Rocephin. #3 cellulitis  Of leg; Patient already on Rocephin. #4 ESRD on hemodialysis Tuesday, Thursday, Saturday. Patient missed hemodialysis today. Obtain nephrology consult for dialysis needs. #5 COPD: Patient to home medications with Proventil, Brovana, are restarted. Anxiety continue Xanax 0.5 mg 4 times daily as needed.  #6. Coagulopathy with cirrhosis of the liver:follows up with Dr.lateef/    All the records are reviewed and case discussed with ED provider. Management plans discussed with the patient, family and they are in agreement.  CODE STATUS: full  TOTAL TIME TAKING CARE OF THIS  PATIENT: 60 minutes.    Katha Hamming M.D on 05/03/2015 at 5:01 PM  Between 7am to 6pm - Pager - [phone]  After 6pm go to [email or web address] - password [email or web address]  [facility]  Office  [phone]  CC: Primary care physician; [name], MD  Note: This dictation was prepared with Dragon dictation along with smaller phrase technology. Any transcriptional errors that result from this process are unintentional.

## 2015-05-03 NOTE — ED Notes (Addendum)
Pt went to pick up remote and fell to floor because so weak. Unable to get up.  Has double vision in right eye since last night.  Supposed to get dialysis today.  pts sister unable to reach pt and went over and found pt in floor.  C/o weakness in right arm and left.  Slightly lighter grip in right hand.  Appears to have equal strength in lower extremities in triage.  Weakness started this morning at 0730 but double vision in right eye started last night.

## 2015-05-04 ENCOUNTER — Inpatient Hospital Stay (HOSPITAL_BASED_OUTPATIENT_CLINIC_OR_DEPARTMENT_OTHER)
Admit: 2015-05-04 | Discharge: 2015-05-04 | Disposition: A | Discharge status: Home / Self Care | Payer: Medicare HMO | Attending: Internal Medicine | Admitting: Internal Medicine

## 2015-05-04 DIAGNOSIS — G459 Transient cerebral ischemic attack, unspecified: Secondary | ICD-10-CM

## 2015-05-04 LAB — BASIC METABOLIC PANEL
Anion gap: 12 (ref 5–15)
BUN: 33 mg/dL — AB (ref 6–20)
CO2: 25 mmol/L (ref 22–32)
Calcium: 8.1 mg/dL — ABNORMAL LOW (ref 8.9–10.3)
Chloride: 96 mmol/L — ABNORMAL LOW (ref 101–111)
Creatinine, Ser: 5.44 mg/dL — ABNORMAL HIGH (ref 0.44–1.00)
GFR calc Af Amer: 8 mL/min — ABNORMAL LOW (ref >60)
GFR, EST NON AFRICAN AMERICAN: 7 mL/min — AB (ref >60)
GLUCOSE: 89 mg/dL (ref 65–99)
POTASSIUM: 3.3 mmol/L — AB (ref 3.5–5.1)
Sodium: 133 mmol/L — ABNORMAL LOW (ref 135–145)

## 2015-05-04 LAB — CBC
HEMATOCRIT: 35 % (ref 35.0–47.0)
Hemoglobin: 10.9 g/dL — ABNORMAL LOW (ref 12.0–16.0)
MCH: 27.5 pg (ref 26.0–34.0)
MCHC: 31.3 g/dL — AB (ref 32.0–36.0)
MCV: 87.9 fL (ref 80.0–100.0)
Platelets: 99 10*3/uL — ABNORMAL LOW (ref 150–440)
RBC: 3.98 MIL/uL (ref 3.80–5.20)
RDW: 20.4 % — AB (ref 11.5–14.5)
WBC: 6.4 10*3/uL (ref 3.6–11.0)

## 2015-05-04 LAB — PROTIME-INR
INR: 2.38
Prothrombin Time: 25.7 seconds — ABNORMAL HIGH (ref 11.4–15.0)

## 2015-05-04 MED ORDER — CEFUROXIME AXETIL 500 MG PO TABS: 500.0000 mg | ORAL_TABLET | Freq: Two times a day (BID) | ORAL | Status: AC

## 2015-05-04 NOTE — Progress Notes (Signed)
Clinical Child psychotherapist (CSW) received verbal consult from PT that recommendation is for SNF. Patient has a discharge order in for home today. Per PT patient will refuse SNF. CSW contacted patient and discussed SNF. Patient refused SNF and reported that she is going home with home health. Per patient she lives in [place] and goes to dialysis at [facility] on [address]. RN Case Manager is aware of above. Please reconsult if future social work needs arise. CSW signing off.   Jetta Lout, LCSW [phone]

## 2015-05-04 NOTE — Care Management Note (Signed)
Case Management Note  Patient Details  Name: Brandi Cline MRN: [id number] Date of Birth: 1931-03-29  Subjective/Objective:    [facility] and [facility] do not accept Ms [employer]. Discussed this with Ms [place] and she agreed upon a referral for home health PT and RN to [facility] who reported that they do accept Ms [employer]. A referral was faxed to [facility] requesting RN and PT.                 Action/Plan:   Expected Discharge Date:                  Expected Discharge Plan:     In-House Referral:     Discharge planning Services     Post Acute Care Choice:    Choice offered to:     DME Arranged:    DME Agency:     HH Arranged:    HH Agency:     Status of Service:     Medicare Important Message Given:    Date Medicare IM Given:    Medicare IM give by:    Date Additional Medicare IM Given:    Additional Medicare Important Message give by:     If discussed at Long Length of Stay Meetings, dates discussed:    Additional Comments:  Ellieana Dolecki A, RN 05/04/2015, 2:05 PM

## 2015-05-04 NOTE — Progress Notes (Signed)
[facility] - [place] at [facility]   PATIENT NAME: Brandi Cline    MR#:  [id number]  DATE OF BIRTH:  [birth year]  SUBJECTIVE:   Patient is doing well this morning. Patient denies weakness on the right or left side.  REVIEW OF SYSTEMS:    Review of Systems  Constitutional: Negative for fever, chills and malaise/fatigue.  HENT: Negative for sore throat.   Eyes: Negative for blurred vision.  Respiratory: Negative for cough, hemoptysis, shortness of breath and wheezing.   Cardiovascular: Positive for leg swelling. Negative for chest pain and palpitations.  Gastrointestinal: Negative for nausea, vomiting, abdominal pain, diarrhea and blood in stool.  Genitourinary: Negative for dysuria.  Musculoskeletal: Negative for back pain.  Skin: Positive for rash.  Neurological: Negative for dizziness, tremors and headaches.  Endo/Heme/Allergies: Does not bruise/bleed easily.    Tolerating Diet: Yes       DRUG ALLERGIES:   Allergies  Allergen Reactions  . Avelox [Moxifloxacin Hcl In Nacl] Shortness Of Breath, Swelling and Rash  . Codeine Shortness Of Breath and Other (See Comments)    Couldn't breath  . Theophyllines     Slight increased heart rate  . Ciprofloxacin Other (See Comments)    nervous  . Milk-Related Compounds Other (See Comments)    Whole milk hurts stomach. Not all dairy products do this.  . Hydrocodone Other (See Comments)    "messes with my head."  . Tape     Please use paper tape only  . Lisinopril Rash    VITALS:  Blood pressure 107/43, pulse 67, temperature 97.5 F (36.4 C), temperature source Oral, resp. rate 18, height 5\' 3"  (1.6 m), weight 63.458 kg (139 lb 14.4 oz), SpO2 97 %.  PHYSICAL EXAMINATION:   Physical Exam  Constitutional: She is oriented to person, place, and time and well-developed, well-nourished, and in no distress. No distress.  HENT:  Head: Normocephalic.  Eyes: No scleral icterus.  Neck: Normal range of  motion. Neck supple. No JVD present. No tracheal deviation present.  Cardiovascular: Normal rate, regular rhythm and normal heart sounds.  Exam reveals no gallop and no friction rub.   No murmur heard. Pulmonary/Chest: Effort normal and breath sounds normal. No respiratory distress. She has no wheezes. She has no rales. She exhibits no tenderness.  Abdominal: Soft. Bowel sounds are normal. She exhibits distension. She exhibits no mass. There is no tenderness. There is no rebound and no guarding.  Musculoskeletal: Normal range of motion. She exhibits edema.  Neurological: She is alert and oriented to person, place, and time.  Skin: Skin is warm. No rash noted. No erythema.  Psychiatric: Affect and judgment normal.      LABORATORY PANEL:   CBC  Recent Labs Lab 05/04/15 0452  WBC 6.4  HGB 10.9*  HCT 35.0  PLT 99*   ------------------------------------------------------------------------------------------------------------------  Chemistries   Recent Labs Lab 05/03/15 1210 05/04/15 0452  NA 134* 133*  K 3.6 3.3*  CL 96* 96*  CO2 27 25  GLUCOSE 109* 89  BUN 28* 33*  CREATININE 5.09* 5.44*  CALCIUM 8.3* 8.1*  AST 21  --   ALT 23  --   ALKPHOS 236*  --   BILITOT 1.8*  --    ------------------------------------------------------------------------------------------------------------------  Cardiac Enzymes  Recent Labs Lab 05/03/15 1210  TROPONINI <0.03   ------------------------------------------------------------------------------------------------------------------  RADIOLOGY:  Dg Chest 2 View  05/03/2015  CLINICAL DATA:  Generalized week this is this morning. History of COPD, CHF, dialysis. EXAM: CHEST  2 VIEW COMPARISON:  Chest x-rays dated 04/25/2015 and 04/10/2015. FINDINGS: Heart size is normal. Overall cardiomediastinal silhouette is stable in size and configuration. Right chest wall dialysis catheter appears stable in position with tip overlying the upper  SVC. Coarse interstitial markings bilaterally are chronic. No new lung findings seen. No pleural effusion. No pneumothorax. There is a new slightly displaced fracture of the left eighth posterior-lateral rib. This appears acute. Osseous structures otherwise unremarkable. IMPRESSION: 1. Acute fracture of the left eighth rib, posterior-lateral aspect, slightly displaced. 2. No evidence of acute cardiopulmonary abnormality. Electronically Signed   By: Bary Richard M.D.   On: 05/03/2015 13:35   Ct Head Wo Contrast  05/03/2015  CLINICAL DATA:  Weakness with recent fall, initial encounter EXAM: CT HEAD WITHOUT CONTRAST TECHNIQUE: Contiguous axial images were obtained from the base of the skull through the vertex without intravenous contrast. COMPARISON:  None. FINDINGS: The bony calvarium is intact. Paranasal sinuses and mastoid air cells are well aerated. Very minimal atrophic changes are noted commenced with the patient's given age. No findings to suggest acute hemorrhage, acute infarction or space-occupying mass lesion are noted. Basal ganglia calcifications are seen bilaterally. IMPRESSION: Mild atrophy without acute abnormality. Electronically Signed   By: Alcide Clever M.D.   On: 05/03/2015 11:40   Mr Brain Wo Contrast  05/03/2015  CLINICAL DATA:  82 year old female on hemodialysis presenting with generalized weakness and intermittent double vision. Slurred speech. Cirrhosis. Subsequent encounter. EXAM: MRI HEAD WITHOUT CONTRAST TECHNIQUE: Multiplanar, multiecho pulse sequences of the brain and surrounding structures were obtained without intravenous contrast. COMPARISON:  05/03/2015 head CT. FINDINGS: No acute infarct or intracranial hemorrhage. Mild chronic small vessel disease changes most notable periventricular region. Global atrophy without hydrocephalus. Mild mineralization globus pallidus without significant T1 shortening as can be seen with hepatic encephalopathy. Major intracranial vascular  structures are patent with ectatic vertebral arteries and basilar artery. Post lens replacement. Bone marrow changes of renal failure/ anemia. Cervical medullary junction, pituitary region and honey region unremarkable. Partial opacification mastoid air cells without obstructing lesion noted. Minimal mucosal thickening ethmoid sinus air cells. IMPRESSION: No acute infarct or intracranial hemorrhage. Mild chronic small vessel disease changes most notable periventricular region. Global atrophy without hydrocephalus. Mild mineralization globus pallidus without significant T1 shortening as can be seen with hepatic encephalopathy. Bone marrow changes of renal failure/ anemia. Electronically Signed   By: Lacy Duverney M.D.   On: 05/03/2015 19:15   US Carotid Bilateral  05/03/2015  CLINICAL DATA:  Right-sided weakness and blurred vision. EXAM: BILATERAL CAROTID DUPLEX ULTRASOUND TECHNIQUE: Wallace Cullens scale imaging, color Doppler and duplex ultrasound were performed of bilateral carotid and vertebral arteries in the neck. COMPARISON:  None. FINDINGS: Criteria: Quantification of carotid stenosis is based on velocity parameters that correlate the residual internal carotid diameter with NASCET-based stenosis levels, using the diameter of the distal internal carotid lumen as the denominator for stenosis measurement. The following velocity measurements were obtained: RIGHT ICA:  88/10 cm/sec CCA:  127/10 cm/sec SYSTOLIC ICA/CCA RATIO:  0.7 DIASTOLIC ICA/CCA RATIO:  1.0 ECA:  141 cm/sec LEFT ICA:  82/13 cm/sec CCA:  60/9 cm/sec SYSTOLIC ICA/CCA RATIO:  1.4 DIASTOLIC ICA/CCA RATIO:  1.5 ECA:  79 cm/sec RIGHT CAROTID ARTERY: There is a mild amount of calcified plaque at the level of the common carotid artery. A mild amount of partially calcified plaque at the level of the carotid bulb extends into the proximal ICA. Velocities and waveforms are normal and estimated right ICA stenosis is  less than 50%. RIGHT VERTEBRAL ARTERY: Antegrade  flow with normal waveform and velocity. LEFT CAROTID ARTERY: Moderate amount of predominately calcified plaque is present at the level of the carotid bulb and extending into the proximal left ICA. Velocities and waveforms are normal and estimated left ICA stenosis is less than 50%. LEFT VERTEBRAL ARTERY: Antegrade flow with normal waveform and velocity. IMPRESSION: Plaque at the level of both carotid bulbs and proximal internal carotid arteries with mild plaque on the right and moderate plaque on the left. No significant carotid stenosis is identified with estimated bilateral ICA stenoses of less than 50%. Electronically Signed   By: Irish Lack M.D.   On: 05/03/2015 17:52     ASSESSMENT AND PLAN:   82 year old female with end-stage renal disease and liver cirrhosis who presented with right-sided weakness.  1. Right-sided weakness: This is due to urinary tract infection. Patient did have stroke workup including MRI which was negative for an acute stroke and carotid Doppler which showed no hemodynamically significant stenosis.  2. Urinary tract infection, acute cystitis without hematuria: Patient will be discharged on Ceftin. Urine cultures pending. Patient was on Rocephin in the emergency room.  3.. End-stage renal disease on hemodialysis: Patient will resume her Tuesday, Thursday and Saturday dialysis. She did miss dialysis on Saturday.  4.. COPD: Patient has no signs of exacerbation. Continue inhalers.  5. Anxiety: Patient is on Xanax which she'll continue.  6. Liver cirrhosis with coagulopathy: Patient reports she follows up with Dr. Cherylann Ratel.    Management plans discussed with the patient and she is in agreement.  CODE STATUS: full  TOTAL TIME TAKING CARE OF THIS PATIENT: 30 minutes.     POSSIBLE D/C today, DEPENDING ON CLINICAL CONDITION.   Issam Carlyon M.D on 05/04/2015 at 9:43 AM  Between 7am to 6pm - Pager - [phone] After 6pm go to [email or web address] - password [email or web address]  [email or web address]  [facility]  Office  [phone]  CC: Primary care physician; [name], MD  Note: This dictation was prepared with Dragon dictation along with smaller phrase technology. Any transcriptional errors that result from this process are unintentional.

## 2015-05-04 NOTE — Discharge Summary (Signed)
[facility] - [place] at [facility]   PATIENT NAME: Brandi Cline    MR#:  [id number]  DATE OF BIRTH:  [birth year]  DATE OF ADMISSION:  05/03/2015 ADMITTING PHYSICIAN: Katha Hamming, MD  DATE OF DISCHARGE: 05/04/2015 PRIMARY CARE PHYSICIAN: [name], MD    ADMISSION DIAGNOSIS:  UTI (lower urinary tract infection) [N39.0] Right sided weakness [M62.89] Rib fracture, left, closed, initial encounter [S22.32XA]  DISCHARGE DIAGNOSIS:  Active Problems:   UTI (lower urinary tract infection)   SECONDARY DIAGNOSIS:   Past Medical History  Diagnosis Date  . Dyspnea   . Esophageal reflux   . CAD (coronary artery disease)   . Hypertension   . Crohn's disease (HCC)   . Allergic rhinitis   . COPD (chronic obstructive pulmonary disease) (HCC)   . History of kidney stones   . History of shingles   . Trigeminal neuralgia   . Family history of anesthesia complication     " SISTER HAD BAD FEELINGS "  . Anginal pain (HCC)   . Anxiety     ' JUST FOR MY BREATHING "  . Arthritis     mild in hands  . Hyperlipidemia   . CKD (chronic kidney disease)   . Kidney stones   . CHF (congestive heart failure) (HCC)   . Anemia     HOSPITAL COURSE:  82 year old female with end-stage renal disease and liver cirrhosis who presented with right-sided weakness.  1. Right-sided weakness: This is due to urinary tract infection. Patient did have stroke workup including MRI which was negative for an acute stroke and carotid Doppler which showed no hemodynamically significant stenosis.  2. Urinary tract infection, acute cystitis without hematuria: Patient will be discharged on Ceftin. Patient was on Rocephin in the hospital  3.. End-stage renal disease on hemodialysis: Patient will resume her Tuesday, Thursday and Saturday dialysis. She did miss dialysis on Saturday.  4.. COPD: Patient has no signs of exacerbation. Continue inhalers.  5. Anxiety: Patient is on Xanax which  she'll continue.  6. Liver cirrhosis with coagulopathy: Patient reports she follows up with Dr. Cherylann Ratel.    DISCHARGE CONDITIONS AND DIET:   Patient is stable for discharge on a renal diet  CONSULTS OBTAINED:  Treatment Team:  Mady Haagensen, MD  DRUG ALLERGIES:   Allergies  Allergen Reactions  . Avelox [Moxifloxacin Hcl In Nacl] Shortness Of Breath, Swelling and Rash  . Codeine Shortness Of Breath and Other (See Comments)    Couldn't breath  . Theophyllines     Slight increased heart rate  . Ciprofloxacin Other (See Comments)    nervous  . Milk-Related Compounds Other (See Comments)    Whole milk hurts stomach. Not all dairy products do this.  . Hydrocodone Other (See Comments)    "messes with my head."  . Tape     Please use paper tape only  . Lisinopril Rash    DISCHARGE MEDICATIONS:   Discharge Medication List as of 05/04/2015 10:05 AM    START taking these medications   Details  cefUROXime (CEFTIN) 500 MG tablet Take 1 tablet (500 mg total) by mouth 2 (two) times daily with a meal., Starting 05/04/2015, Until Discontinued, Normal      CONTINUE these medications which have NOT CHANGED   Details  albuterol (PROVENTIL HFA;VENTOLIN HFA) 108 (90 BASE) MCG/ACT inhaler Inhale 2 puffs into the lungs every 6 (six) hours as needed for wheezing or shortness of breath., Starting 10/08/2014, Until Discontinued, Normal  albuterol (PROVENTIL) (2.5 MG/3ML) 0.083% nebulizer solution Take 3 mLs (2.5 mg total) by nebulization every 4 (four) hours as needed. Shortness of breath, Starting 10/03/2014, Until Discontinued, Print    ALPRAZolam (XANAX) 0.5 MG tablet 1 tab by mouth four times daily as needed for anxiety and or insomnia, Print    arformoterol (BROVANA) 15 MCG/2ML NEBU Take 2 mLs (15 mcg total) by nebulization 2 (two) times daily., Starting 08/14/2014, Until Discontinued, Print    Cholecalciferol (VITAMIN D3) 2000 units capsule Take 2,000 Units by mouth daily., Until  Discontinued, Historical Med    magnesium oxide (MAG-OX) 400 MG tablet Take 400 mg by mouth daily. , Until Discontinued, Historical Med    omeprazole (PRILOSEC) 20 MG capsule TAKE ONE CAPSULE BY MOUTH DAILY, Normal    sevelamer carbonate (RENVELA) 800 MG tablet Take 2,400 mg by mouth 3 (three) times daily with meals., Until Discontinued, Historical Med    isosorbide mononitrate (IMDUR) 30 MG 24 hr tablet TAKE TWO TABLETS BY MOUTH DAILY, Normal    nitroGLYCERIN (NITROSTAT) 0.4 MG SL tablet Place 1 tablet (0.4 mg total) under the tongue every 5 (five) minutes as needed. Chest pain, Starting 04/27/2013, Until Discontinued, Normal      STOP taking these medications     azithromycin (ZITHROMAX) 250 MG tablet               Today   CHIEF COMPLAINT:  Patient is doing well this point. Patient reports no weakness. She would like to be discharged home after dialysis if possible.   VITAL SIGNS:  Blood pressure 125/35, pulse 82, temperature 97.6 F (36.4 C), temperature source Oral, resp. rate 18, height  (1.6 m), weight 63.458 kg (139 lb 14.4 oz), SpO2 97 %.   REVIEW OF SYSTEMS:  Review of Systems  Constitutional: Negative for fever, chills and malaise/fatigue.  HENT: Negative for sore throat.   Eyes: Negative for blurred vision.  Respiratory: Negative for cough, hemoptysis, shortness of breath and wheezing.   Cardiovascular: Positive for leg swelling. Negative for chest pain and palpitations.  Gastrointestinal: Negative for nausea, vomiting, abdominal pain, diarrhea and blood in stool.  Genitourinary: Negative for dysuria.  Musculoskeletal: Negative for back pain.  Skin: Positive for rash.  Neurological: Negative for dizziness, tremors and headaches.  Endo/Heme/Allergies: Does not bruise/bleed easily.     PHYSICAL EXAMINATION:  GENERAL:  82 y.o.-year-old patient lying in the bed with no acute distress.  NECK:  Supple, no jugular venous distention. No thyroid  enlargement, no tenderness.  LUNGS: Normal breath sounds bilaterally, no wheezing, rales,rhonchi  No use of accessory muscles of respiration.  CARDIOVASCULAR: S1, S2 normal. No murmurs, rubs, or gallops.  ABDOMEN: Soft, non-tender, non-distended. Bowel sounds present. No organomegaly or mass.  EXTREMITIES: Minimal bilateral  edema, no cyanosis, or clubbing.  PSYCHIATRIC: The patient is alert and oriented x 3.  Neuro: Cranial nerves II through XII are intact there is no focal deficits.   DATA REVIEW:   CBC  Recent Labs Lab 05/04/15 0452  WBC 6.4  HGB 10.9*  HCT 35.0  PLT 99*    Chemistries   Recent Labs Lab 05/03/15 1210 05/04/15 0452  NA 134* 133*  K 3.6 3.3*  CL 96* 96*  CO2 27 25  GLUCOSE 109* 89  BUN 28* 33*  CREATININE 5.09* 5.44*  CALCIUM 8.3* 8.1*  AST 21  --   ALT 23  --   ALKPHOS 236*  --   BILITOT 1.8*  --  Cardiac Enzymes  Recent Labs Lab 05/03/15 1210  TROPONINI <0.03    Microbiology Results  @[email or web address] @  RADIOLOGY:  Dg Chest 2 View  05/03/2015  CLINICAL DATA:  Generalized week this is this morning. History of COPD, CHF, dialysis. EXAM: CHEST  2 VIEW COMPARISON:  Chest x-rays dated 04/25/2015 and 04/10/2015. FINDINGS: Heart size is normal. Overall cardiomediastinal silhouette is stable in size and configuration. Right chest wall dialysis catheter appears stable in position with tip overlying the upper SVC. Coarse interstitial markings bilaterally are chronic. No new lung findings seen. No pleural effusion. No pneumothorax. There is a new slightly displaced fracture of the left eighth posterior-lateral rib. This appears acute. Osseous structures otherwise unremarkable. IMPRESSION: 1. Acute fracture of the left eighth rib, posterior-lateral aspect, slightly displaced. 2. No evidence of acute cardiopulmonary abnormality. Electronically Signed   By: Bary Richard M.D.   On: 05/03/2015 13:35   Mr Brain Wo Contrast  05/03/2015  CLINICAL DATA:   81 year old female on hemodialysis presenting with generalized weakness and intermittent double vision. Slurred speech. Cirrhosis. Subsequent encounter. EXAM: MRI HEAD WITHOUT CONTRAST TECHNIQUE: Multiplanar, multiecho pulse sequences of the brain and surrounding structures were obtained without intravenous contrast. COMPARISON:  05/03/2015 head CT. FINDINGS: No acute infarct or intracranial hemorrhage. Mild chronic small vessel disease changes most notable periventricular region. Global atrophy without hydrocephalus. Mild mineralization globus pallidus without significant T1 shortening as can be seen with hepatic encephalopathy. Major intracranial vascular structures are patent with ectatic vertebral arteries and basilar artery. Post lens replacement. Bone marrow changes of renal failure/ anemia. Cervical medullary junction, pituitary region and honey region unremarkable. Partial opacification mastoid air cells without obstructing lesion noted. Minimal mucosal thickening ethmoid sinus air cells. IMPRESSION: No acute infarct or intracranial hemorrhage. Mild chronic small vessel disease changes most notable periventricular region. Global atrophy without hydrocephalus. Mild mineralization globus pallidus without significant T1 shortening as can be seen with hepatic encephalopathy. Bone marrow changes of renal failure/ anemia. Electronically Signed   By: Lacy Duverney M.D.   On: 05/03/2015 19:15   US Carotid Bilateral  05/03/2015  CLINICAL DATA:  Right-sided weakness and blurred vision. EXAM: BILATERAL CAROTID DUPLEX ULTRASOUND TECHNIQUE: Wallace Cullens scale imaging, color Doppler and duplex ultrasound were performed of bilateral carotid and vertebral arteries in the neck. COMPARISON:  None. FINDINGS: Criteria: Quantification of carotid stenosis is based on velocity parameters that correlate the residual internal carotid diameter with NASCET-based stenosis levels, using the diameter of the distal internal carotid lumen as  the denominator for stenosis measurement. The following velocity measurements were obtained: RIGHT ICA:  88/10 cm/sec CCA:  127/10 cm/sec SYSTOLIC ICA/CCA RATIO:  0.7 DIASTOLIC ICA/CCA RATIO:  1.0 ECA:  141 cm/sec LEFT ICA:  82/13 cm/sec CCA:  60/9 cm/sec SYSTOLIC ICA/CCA RATIO:  1.4 DIASTOLIC ICA/CCA RATIO:  1.5 ECA:  79 cm/sec RIGHT CAROTID ARTERY: There is a mild amount of calcified plaque at the level of the common carotid artery. A mild amount of partially calcified plaque at the level of the carotid bulb extends into the proximal ICA. Velocities and waveforms are normal and estimated right ICA stenosis is less than 50%. RIGHT VERTEBRAL ARTERY: Antegrade flow with normal waveform and velocity. LEFT CAROTID ARTERY: Moderate amount of predominately calcified plaque is present at the level of the carotid bulb and extending into the proximal left ICA. Velocities and waveforms are normal and estimated left ICA stenosis is less than 50%. LEFT VERTEBRAL ARTERY: Antegrade flow with normal waveform and velocity. IMPRESSION: Plaque at the  level of both carotid bulbs and proximal internal carotid arteries with mild plaque on the right and moderate plaque on the left. No significant carotid stenosis is identified with estimated bilateral ICA stenoses of less than 50%. Electronically Signed   By: Irish Lack M.D.   On: 05/03/2015 17:52      Management plans discussed with the patient and she is in agreement. Stable for discharge   Patient should follow up with dr Cherylann Ratel  CODE STATUS:     Code Status Orders        Start     Ordered   05/03/15 1603  Full code   Continuous     05/03/15 1606    Code Status History    Date Active Date Inactive Code Status Order ID Comments User Context   01/22/2015  4:41 PM 02/11/2015  8:58 PM DNR [id number]  Gale Journey, MD Inpatient   01/22/2015 12:44 PM 01/22/2015  4:41 PM Full Code [id number]  Gale Journey, MD Inpatient   01/10/2015 12:47 PM 01/15/2015   3:27 PM Full Code [id number]  Auburn Bilberry, MD Inpatient   09/19/2013 10:56 PM 09/23/2013  8:12 PM Full Code [id number]  Doree Albee, MD ED   05/13/2012  3:32 PM 05/15/2012  4:55 PM Full Code [id number]  Artelia Laroche, RN Inpatient   05/13/2012 10:24 AM 05/13/2012  3:32 PM Full Code [id number]  Hewitt Shorts, RN ED   10/05/2011  9:35 PM 10/08/2011  2:55 PM Full Code [id number]  Idelle Jo., RN ED    Advance Directive Documentation        Most Recent Value   Type of Advance Directive  Healthcare Power of Attorney   Pre-existing out of facility DNR order (yellow form or pink MOST form)     "MOST" Form in Place?        TOTAL TIME TAKING CARE OF THIS PATIENT: 35 minutes.    Note: This dictation was prepared with Dragon dictation along with smaller phrase technology. Any transcriptional errors that result from this process are unintentional.  Sanskriti Greenlaw M.D on 05/05/2015 at 12:16 PM  Between 7am to 6pm - Pager - [phone] After 6pm go to [email or web address] - password [email or web address] [facility]  [facility]  Office  [phone]  CC: Primary care physician; [name], MD

## 2015-05-04 NOTE — Evaluation (Signed)
Physical Therapy Evaluation Patient Details Name: Brandi Cline MRN: [id number] DOB: [birth year] Today's Date: 05/04/2015   History of Present Illness  presented to ER secondary to generalized weakness, double vision and intermittent slurred speech; admitted with UTI/acute cystitis.  CVA work-up negative for acute change.  Clinical Impression  Upon evaluation, patient alert and oriented to basic information; follows all commands and demonstrates fair/good safety awareness and insight.  Bilat UE/LE strength and ROM grossly WFL for basic transfers and mobility, though generally deconditioned due to acute illness.  No focal weakness, sensory or coordination deficit appreciated.  Currently able to complete bed mobility with mod indep; sit/stand, basic transfers and short-distance gait (50-60') with RW, cga/close sup.  Very slow and deliberate ("slower than normal" per patient), but no overt buckling or LOB. Unable to tolerate additional distance due to fatigue (BORG 6/10); question ability to safely complete all daily household activities required to facilitate safe discharge home alone at this time given significant cardiopulmonary endurance deficits. Would benefit from skilled PT to address above deficits and promote optimal return to PLOF; recommend transition to STR upon discharge from acute hospitalization.  May progress to HHPT as cardiorespiratory status improves--will continue to monitor. Of note, patient preferring home with HHPT at this time.     Follow Up Recommendations SNF (to address cardiopulmonary endurance)    Equipment Recommendations  Rolling walker with 5" wheels    Recommendations for Other Services       Precautions / Restrictions Precautions Precautions: Fall Precaution Comments: L UE AVF, R IJ permcath, fluid restriction Restrictions Weight Bearing Restrictions: No      Mobility  Bed Mobility Overal bed mobility: Modified Independent             General  bed mobility comments: increased time to complete  Transfers Overall transfer level: Needs assistance Equipment used: Rolling walker (2 wheeled) Transfers: Sit to/from Stand Sit to Stand: Min guard;Supervision            Ambulation/Gait Ambulation/Gait assistance: Supervision;Min guard Ambulation Distance (Feet): 60 Feet Assistive device: Rolling walker (2 wheeled)     Gait velocity interpretation: <1.8 ft/sec, indicative of risk for recurrent falls General Gait Details: reciprocal stepping pattern with fair step height/length; very slow cadence and gait speed, but no overt buckling or LOB.  Vitals stable and WFL, but patient notably fatigued with minimal activity (BORG 6/10)  Stairs            Wheelchair Mobility    Modified Rankin (Stroke Patients Only)       Balance Overall balance assessment: Needs assistance Sitting-balance support: No upper extremity supported;Feet supported Sitting balance-Leahy Scale: Good     Standing balance support: Bilateral upper extremity supported Standing balance-Leahy Scale: Fair                               Pertinent Vitals/Pain Pain Assessment: No/denies pain    Home Living Family/patient expects to be discharged to:: Private residence Living Arrangements: Alone Available Help at Discharge: Family (per previous records sister lives next door, other relatives close by) Type of Home: House Home Access: Stairs to enter Entrance Stairs-Rails: Right Entrance Stairs-Number of Steps: 3 Home Layout: One level Home Equipment: Environmental consultant - 2 wheels;Walker - 4 wheels;Cane - single point      Prior Function Level of Independence: Independent with assistive device(s)         Comments: Indep without assist device for household  mobility, intermittent use of SPC at times (when patient feels she needs it due to 'weakness'); denies fall history.  Daughter drives patient and assists with community errands as needed; friend  transports to/from dialysis.     Hand Dominance        Extremity/Trunk Assessment   Upper Extremity Assessment: Overall WFL for tasks assessed           Lower Extremity Assessment: Generalized weakness (grossly 4/5 throughout, significant pitting edema bilat LEs knees distally (3+))         Communication   Communication: No difficulties  Cognition Arousal/Alertness: Awake/alert Behavior During Therapy: WFL for tasks assessed/performed Overall Cognitive Status: Within Functional Limits for tasks assessed                      General Comments      Exercises        Assessment/Plan    PT Assessment Patient needs continued PT services  PT Diagnosis Difficulty walking;Generalized weakness   PT Problem List Decreased activity tolerance;Decreased balance;Decreased mobility;Cardiopulmonary status limiting activity  PT Treatment Interventions DME instruction;Gait training;Stair training;Functional mobility training;Therapeutic activities;Therapeutic exercise;Balance training;Patient/family education   PT Goals (Current goals can be found in the Care Plan section) Acute Rehab PT Goals Patient Stated Goal: "to go home and have therapy come to my house" PT Goal Formulation: With patient Time For Goal Achievement: 05/18/15 Potential to Achieve Goals: Good    Frequency Min 2X/week   Barriers to discharge Decreased caregiver support      Co-evaluation               End of Session Equipment Utilized During Treatment: Gait belt;Oxygen Activity Tolerance: Patient limited by fatigue Patient left: in chair;with call bell/phone within reach;with chair alarm set           Time: 4098-1191 PT Time Calculation (min) (ACUTE ONLY): 22 min   Charges:   PT Evaluation $PT Eval Low Complexity: 1 Procedure     PT G Codes:        Eron Goble H. Manson Passey, PT, DPT, NCS 05/04/2015, 12:22 PM [phone]

## 2015-05-04 NOTE — Discharge Instructions (Signed)
Notify your doctor if your symptoms return and/or worsen.  Take all medications as prescribed. Take your antibiotics until complete even if you are feeling better.  Drink plenty of fluids.  Notify your doctor with any questions or concerns.

## 2015-05-06 ENCOUNTER — Telehealth: Payer: Self-pay | Admitting: *Deleted

## 2015-05-06 LAB — URINE CULTURE: Culture: 30000

## 2015-05-06 NOTE — Telephone Encounter (Signed)
Transition Care Management Follow-up Telephone Call   Date discharged? 05/04/15   How have you been since you were released from the hospital? Slowly improving.   Do you understand why you were in the hospital? yes   Do you understand the discharge instructions? yes   Where were you discharged to? home   Items Reviewed:  Medications reviewed: yes  Allergies reviewed: yes  Dietary changes reviewed: yes, renal diet  Referrals reviewed: yes, home health/PT   Functional Questionnaire:   Activities of Daily Living (ADLs):   She states they are independent in the following: ambulation, bathing and hygiene, feeding, continence, grooming, toileting and dressing States they require assistance with the following: none   Any transportation issues/concerns?: no   Any patient concerns? no   Confirmed importance and date/time of follow-up visits scheduled yes, 05/12/15 @ 1400  Provider Appointment booked with [name], MD  Confirmed with patient if condition begins to worsen call PCP or go to the ER.  Patient was given the office number and encouraged to call back with question or concerns.  : yes

## 2015-05-07 ENCOUNTER — Ambulatory Visit
Admission: RE | Admit: 2015-05-07 | Discharge: 2015-05-07 | Disposition: A | Discharge status: Home / Self Care | Payer: Medicare HMO | Source: Ambulatory Visit | Attending: Nephrology | Admitting: Nephrology

## 2015-05-07 DIAGNOSIS — K7460 Unspecified cirrhosis of liver: Secondary | ICD-10-CM

## 2015-05-07 DIAGNOSIS — R188 Other ascites: Secondary | ICD-10-CM | POA: Diagnosis present

## 2015-05-12 ENCOUNTER — Ambulatory Visit: Payer: Medicare HMO | Admitting: Family Medicine

## 2015-05-14 ENCOUNTER — Ambulatory Visit: Payer: Medicare HMO | Admitting: Family Medicine

## 2015-05-17 ENCOUNTER — Emergency Department: Payer: Medicare HMO

## 2015-05-17 ENCOUNTER — Encounter: Payer: Self-pay | Admitting: Emergency Medicine

## 2015-05-17 ENCOUNTER — Inpatient Hospital Stay
Admission: EM | Admit: 2015-05-17 | Discharge: 2015-06-11 | DRG: 291 | Disposition: E | Payer: Medicare HMO | Attending: Internal Medicine | Admitting: Internal Medicine

## 2015-05-17 DIAGNOSIS — Z515 Encounter for palliative care: Secondary | ICD-10-CM | POA: Diagnosis present

## 2015-05-17 DIAGNOSIS — J9811 Atelectasis: Secondary | ICD-10-CM | POA: Diagnosis present

## 2015-05-17 DIAGNOSIS — Z8619 Personal history of other infectious and parasitic diseases: Secondary | ICD-10-CM | POA: Diagnosis not present

## 2015-05-17 DIAGNOSIS — J449 Chronic obstructive pulmonary disease, unspecified: Secondary | ICD-10-CM | POA: Diagnosis present

## 2015-05-17 DIAGNOSIS — Z66 Do not resuscitate: Secondary | ICD-10-CM | POA: Diagnosis present

## 2015-05-17 DIAGNOSIS — K5090 Crohn's disease, unspecified, without complications: Secondary | ICD-10-CM | POA: Diagnosis present

## 2015-05-17 DIAGNOSIS — Z992 Dependence on renal dialysis: Secondary | ICD-10-CM

## 2015-05-17 DIAGNOSIS — E876 Hypokalemia: Secondary | ICD-10-CM | POA: Diagnosis present

## 2015-05-17 DIAGNOSIS — I959 Hypotension, unspecified: Secondary | ICD-10-CM | POA: Diagnosis present

## 2015-05-17 DIAGNOSIS — N186 End stage renal disease: Secondary | ICD-10-CM | POA: Diagnosis present

## 2015-05-17 DIAGNOSIS — E785 Hyperlipidemia, unspecified: Secondary | ICD-10-CM | POA: Diagnosis present

## 2015-05-17 DIAGNOSIS — K7290 Hepatic failure, unspecified without coma: Secondary | ICD-10-CM

## 2015-05-17 DIAGNOSIS — Z825 Family history of asthma and other chronic lower respiratory diseases: Secondary | ICD-10-CM

## 2015-05-17 DIAGNOSIS — K7210 Chronic hepatic failure without coma: Secondary | ICD-10-CM | POA: Diagnosis present

## 2015-05-17 DIAGNOSIS — I5032 Chronic diastolic (congestive) heart failure: Secondary | ICD-10-CM | POA: Diagnosis present

## 2015-05-17 DIAGNOSIS — Z803 Family history of malignant neoplasm of breast: Secondary | ICD-10-CM

## 2015-05-17 DIAGNOSIS — J9611 Chronic respiratory failure with hypoxia: Secondary | ICD-10-CM | POA: Diagnosis present

## 2015-05-17 DIAGNOSIS — Z87442 Personal history of urinary calculi: Secondary | ICD-10-CM

## 2015-05-17 DIAGNOSIS — I2510 Atherosclerotic heart disease of native coronary artery without angina pectoris: Secondary | ICD-10-CM | POA: Diagnosis present

## 2015-05-17 DIAGNOSIS — K219 Gastro-esophageal reflux disease without esophagitis: Secondary | ICD-10-CM | POA: Diagnosis present

## 2015-05-17 DIAGNOSIS — Z9981 Dependence on supplemental oxygen: Secondary | ICD-10-CM

## 2015-05-17 DIAGNOSIS — Z955 Presence of coronary angioplasty implant and graft: Secondary | ICD-10-CM | POA: Diagnosis not present

## 2015-05-17 DIAGNOSIS — Z8249 Family history of ischemic heart disease and other diseases of the circulatory system: Secondary | ICD-10-CM | POA: Diagnosis not present

## 2015-05-17 DIAGNOSIS — Z823 Family history of stroke: Secondary | ICD-10-CM | POA: Diagnosis not present

## 2015-05-17 DIAGNOSIS — R627 Adult failure to thrive: Secondary | ICD-10-CM | POA: Diagnosis present

## 2015-05-17 DIAGNOSIS — R630 Anorexia: Secondary | ICD-10-CM | POA: Diagnosis present

## 2015-05-17 DIAGNOSIS — Z87891 Personal history of nicotine dependence: Secondary | ICD-10-CM

## 2015-05-17 DIAGNOSIS — K7682 Hepatic encephalopathy: Secondary | ICD-10-CM

## 2015-05-17 DIAGNOSIS — K7460 Unspecified cirrhosis of liver: Secondary | ICD-10-CM | POA: Diagnosis present

## 2015-05-17 DIAGNOSIS — I509 Heart failure, unspecified: Secondary | ICD-10-CM

## 2015-05-17 DIAGNOSIS — I132 Hypertensive heart and chronic kidney disease with heart failure and with stage 5 chronic kidney disease, or end stage renal disease: Secondary | ICD-10-CM | POA: Diagnosis present

## 2015-05-17 DIAGNOSIS — Z79899 Other long term (current) drug therapy: Secondary | ICD-10-CM | POA: Diagnosis not present

## 2015-05-17 LAB — CBC WITH DIFFERENTIAL/PLATELET
Basophils Absolute: 0.1 10*3/uL (ref 0–0.1)
Basophils Relative: 1 %
Eosinophils Absolute: 0 10*3/uL (ref 0–0.7)
Eosinophils Relative: 0 %
HEMATOCRIT: 42.9 % (ref 35.0–47.0)
HEMOGLOBIN: 13.9 g/dL (ref 12.0–16.0)
LYMPHS ABS: 1.1 10*3/uL (ref 1.0–3.6)
MCH: 28.7 pg (ref 26.0–34.0)
MCHC: 32.5 g/dL (ref 32.0–36.0)
MCV: 88.4 fL (ref 80.0–100.0)
Monocytes Absolute: 0.9 10*3/uL (ref 0.2–0.9)
Neutro Abs: 6.7 10*3/uL — ABNORMAL HIGH (ref 1.4–6.5)
Platelets: 77 10*3/uL — ABNORMAL LOW (ref 150–440)
RBC: 4.85 MIL/uL (ref 3.80–5.20)
RDW: 20.7 % — ABNORMAL HIGH (ref 11.5–14.5)
WBC: 8.8 10*3/uL (ref 3.6–11.0)

## 2015-05-17 LAB — COMPREHENSIVE METABOLIC PANEL
ALBUMIN: 2.1 g/dL — AB (ref 3.5–5.0)
ALK PHOS: 290 U/L — AB (ref 38–126)
ALT: 41 U/L (ref 14–54)
ANION GAP: 13 (ref 5–15)
AST: 47 U/L — AB (ref 15–41)
BILIRUBIN TOTAL: 2 mg/dL — AB (ref 0.3–1.2)
BUN: 20 mg/dL (ref 6–20)
CO2: 26 mmol/L (ref 22–32)
Calcium: 7.9 mg/dL — ABNORMAL LOW (ref 8.9–10.3)
Chloride: 98 mmol/L — ABNORMAL LOW (ref 101–111)
Creatinine, Ser: 4.11 mg/dL — ABNORMAL HIGH (ref 0.44–1.00)
GFR calc Af Amer: 11 mL/min — ABNORMAL LOW (ref >60)
GFR calc non Af Amer: 9 mL/min — ABNORMAL LOW (ref >60)
GLUCOSE: 85 mg/dL (ref 65–99)
POTASSIUM: 2.7 mmol/L — AB (ref 3.5–5.1)
SODIUM: 137 mmol/L (ref 135–145)
TOTAL PROTEIN: 5 g/dL — AB (ref 6.5–8.1)

## 2015-05-17 LAB — PROTIME-INR
INR: 3.1
Prothrombin Time: 31.4 seconds — ABNORMAL HIGH (ref 11.4–15.0)

## 2015-05-17 LAB — APTT: aPTT: 64 seconds — ABNORMAL HIGH (ref 24–36)

## 2015-05-17 LAB — BRAIN NATRIURETIC PEPTIDE: B Natriuretic Peptide: 151 pg/mL — ABNORMAL HIGH (ref 0.0–100.0)

## 2015-05-17 LAB — TROPONIN I: Troponin I: 0.05 ng/mL — ABNORMAL HIGH (ref <0.031)

## 2015-05-17 LAB — AMMONIA: Ammonia: 101 umol/L — ABNORMAL HIGH (ref 9–35)

## 2015-05-17 MED ORDER — MORPHINE SULFATE (PF) 2 MG/ML IV SOLN
2.0000 mg | INTRAVENOUS | Status: DC | PRN
  Filled 2015-05-17: qty 1

## 2015-05-17 MED ORDER — POTASSIUM CHLORIDE CRYS ER 20 MEQ PO TBCR
EXTENDED_RELEASE_TABLET | ORAL | Status: AC
  Administered 2015-05-17: 40 meq via ORAL
  Filled 2015-05-17: qty 2

## 2015-05-17 MED ORDER — SODIUM CHLORIDE 0.9 % IV BOLUS (SEPSIS)
250.0000 mL | Freq: Once | INTRAVENOUS | Status: AC
  Administered 2015-05-17: 250 mL via INTRAVENOUS

## 2015-05-17 MED ORDER — ONDANSETRON HCL 4 MG/2ML IJ SOLN
4.0000 mg | Freq: Four times a day (QID) | INTRAMUSCULAR | Status: DC | PRN
  Administered 2015-05-19 (×2): 4 mg via INTRAVENOUS
  Filled 2015-05-17 (×2): qty 2

## 2015-05-17 MED ORDER — KCL IN DEXTROSE-NACL 20-5-0.9 MEQ/L-%-% IV SOLN
INTRAVENOUS | Status: DC
  Administered 2015-05-17: 20:00:00 via INTRAVENOUS
  Filled 2015-05-17 (×2): qty 1000

## 2015-05-17 MED ORDER — ONDANSETRON HCL 4 MG PO TABS: 4.0000 mg | ORAL_TABLET | Freq: Four times a day (QID) | ORAL | Status: DC | PRN

## 2015-05-17 MED ORDER — POTASSIUM CHLORIDE CRYS ER 20 MEQ PO TBCR
40.0000 meq | EXTENDED_RELEASE_TABLET | Freq: Once | ORAL | Status: AC
  Administered 2015-05-17: 40 meq via ORAL

## 2015-05-17 MED ORDER — NITROGLYCERIN 0.4 MG SL SUBL: 0.4000 mg | SUBLINGUAL_TABLET | SUBLINGUAL | Status: DC | PRN

## 2015-05-17 MED ORDER — ACETAMINOPHEN 325 MG PO TABS: 650.0000 mg | ORAL_TABLET | Freq: Four times a day (QID) | ORAL | Status: DC | PRN

## 2015-05-17 MED ORDER — ALPRAZOLAM 0.5 MG PO TABS
0.5000 mg | ORAL_TABLET | Freq: Four times a day (QID) | ORAL | Status: DC | PRN
  Administered 2015-05-19: 0.5 mg via ORAL
  Filled 2015-05-17: qty 1

## 2015-05-17 MED ORDER — ACETAMINOPHEN 650 MG RE SUPP: 650.0000 mg | Freq: Four times a day (QID) | RECTAL | Status: DC | PRN

## 2015-05-17 MED ORDER — POTASSIUM CHLORIDE 20 MEQ PO PACK
20.0000 meq | PACK | Freq: Two times a day (BID) | ORAL | Status: DC
  Administered 2015-05-17 – 2015-05-18 (×2): 20 meq via ORAL
  Filled 2015-05-17 (×2): qty 1

## 2015-05-17 MED ORDER — ALBUTEROL SULFATE (2.5 MG/3ML) 0.083% IN NEBU: 3.0000 mL | INHALATION_SOLUTION | Freq: Four times a day (QID) | RESPIRATORY_TRACT | Status: DC | PRN

## 2015-05-17 MED ORDER — ALBUTEROL SULFATE (2.5 MG/3ML) 0.083% IN NEBU: 2.5000 mg | INHALATION_SOLUTION | RESPIRATORY_TRACT | Status: DC | PRN

## 2015-05-17 MED ORDER — BISACODYL 10 MG RE SUPP: 10.0000 mg | Freq: Every day | RECTAL | Status: DC | PRN

## 2015-05-17 NOTE — ED Notes (Signed)
Pt to ED via EMS from [facility] dialysis center. Dailysis staff report bp 80/35 and that they were unable to manage it. Pt has extensive bruising to L arm from above fistula site to fingers, positive radial pulses. Pt denies pain to L arm. 3+/4+ pitting edema to BLE, with some redness and tenderness worse on L side. Pt has hx cellulitis and PNA 6 wks ago.

## 2015-05-17 NOTE — ED Notes (Signed)
Pt requiring lab draw done by phlebotomist d/t multiple draws from IV lines hemolyzing as well as peripheral butterfly sticks hemolyzing.

## 2015-05-17 NOTE — H&P (Signed)
History and Physical    TUESDAE SENTMAN GNO:[id number] DOB: 1930/10/31 DOA: 05/22/2015  Referring physician: Dr. Alphonzo Lemmings PCP: [name], MD  Specialists: Dr. Cherylann Ratel  Chief Complaint: hypotension, weakness  HPI: Brandi Cline is a 82 y.o. female has a past medical history significant for COPD with chronic respiratory failure on O2, chronic diastolic CHF, Crohn's disease, and ESRD on HD who presents today from HD center with weakness and hypotension. In the ER, the patient was found to be hypokalemic and hypotensive. Labs revealed both renal and hepatic abnormalities. The family cannot take her home and request she be admitted for "comfort care" with no aggressive measures. She is now admitted. The patient does admit to SOB, anorexia, LE edema, and chronic diarrhea.  Review of Systems: The patient denies  fever, weight loss,, vision loss, decreased hearing, hoarseness, chest pain, syncope, balance deficits, hemoptysis, abdominal pain, melena, hematochezia, severe indigestion/heartburn, hematuria, incontinence, genital sores, muscle weakness, suspicious skin lesions, transient blindness,  depression, unusual weight change, abnormal bleeding, enlarged lymph nodes, angioedema, and breast masses.   Past Medical History  Diagnosis Date  . Dyspnea   . Esophageal reflux   . CAD (coronary artery disease)   . Hypertension   . Crohn's disease (HCC)   . Allergic rhinitis   . COPD (chronic obstructive pulmonary disease) (HCC)   . History of kidney stones   . History of shingles   . Trigeminal neuralgia   . Family history of anesthesia complication     " SISTER HAD BAD FEELINGS "  . Anginal pain (HCC)   . Anxiety     ' JUST FOR MY BREATHING "  . Arthritis     mild in hands  . Hyperlipidemia   . CKD (chronic kidney disease)   . Kidney stones   . CHF (congestive heart failure) (HCC)   . Anemia    Past Surgical History  Procedure Laterality Date  . Coronary stents    . Lithotripsy    . Ureteral  stent placement    . Cholecystectomy    . Total abdominal hysterectomy    . Partial resections large and small bowel for crohn's    . Breast lumps benign    . Left heart catheterization with coronary angiogram N/A 10/28/2011    Procedure: LEFT HEART CATHETERIZATION WITH CORONARY ANGIOGRAM;  Surgeon: Lesleigh Noe, MD;  Location: [facility] CATH LAB;  Service: Cardiovascular;  Laterality: N/A;  . Peripheral vascular catheterization N/A 01/31/2015    Procedure: Dialysis/Perma Catheter Insertion;  Surgeon: Renford Dills, MD;  Location: [facility] INVASIVE CV LAB;  Service: Cardiovascular;  Laterality: N/A;  . Endobronchial ultrasound N/A 02/05/2015    Procedure: ENDOBRONCHIAL ULTRASOUND;  Surgeon: Stephanie Acre, MD;  Location: [facility] ORS;  Service: Cardiopulmonary;  Laterality: N/A;  . Esophagogastroduodenoscopy (egd) with propofol N/A 02/03/2015    Procedure: ESOPHAGOGASTRODUODENOSCOPY (EGD) WITH PROPOFOL;  Surgeon: Midge Minium, MD;  Location: [facility] ENDOSCOPY;  Service: Endoscopy;  Laterality: N/A;  Look into the colon and stomach with a light to look for the source of bleeding.  . Colonoscopy with propofol N/A 02/03/2015    Procedure: COLONOSCOPY WITH PROPOFOL;  Surgeon: Midge Minium, MD;  Location: [facility] ENDOSCOPY;  Service: Endoscopy;  Laterality: N/A;  . Av fistula placement Left 03/28/2015    Procedure: Creation of arteriovenous fistula left arm;  Surgeon: Renford Dills, MD;  Location: [facility] ORS;  Service: Vascular;  Laterality: Left;   Social History:  reports that she quit smoking about 25  years ago. Her smoking use included Cigarettes. She has a 30 pack-year smoking history. She has never used smokeless tobacco. She reports that she does not drink alcohol or use illicit drugs.  Allergies  Allergen Reactions  . Avelox [Moxifloxacin Hcl In Nacl] Shortness Of Breath, Swelling and Rash  . Codeine Shortness Of Breath and Other (See Comments)    Couldn't breath  . Theophyllines     Slight increased  heart rate  . Ciprofloxacin Other (See Comments)    nervous  . Milk-Related Compounds Other (See Comments)    Whole milk hurts stomach. Not all dairy products do this.  . Hydrocodone Other (See Comments)    "messes with my head."  . Tape     Please use paper tape only  . Lisinopril Rash    Family History  Problem Relation Age of Onset  . Breast cancer Sister   . COPD Sister   . Stroke Mother   . Prostate cancer Father   . Prostate cancer Brother   . Heart attack Brother     Prior to Admission medications   Medication Sig Start Date End Date Taking? Authorizing Provider  albuterol (PROVENTIL HFA;VENTOLIN HFA) 108 (90 BASE) MCG/ACT inhaler Inhale 2 puffs into the lungs every 6 (six) hours as needed for wheezing or shortness of breath. 10/08/14   Waymon Budge, MD  albuterol (PROVENTIL) (2.5 MG/3ML) 0.083% nebulizer solution Take 3 mLs (2.5 mg total) by nebulization every 4 (four) hours as needed. Shortness of breath 10/03/14   Waymon Budge, MD  ALPRAZolam Prudy Feeler) 0.5 MG tablet 1 tab by mouth four times daily as needed for anxiety and or insomnia 04/23/15   Dianne Dun, MD  arformoterol (BROVANA) 15 MCG/2ML NEBU Take 2 mLs (15 mcg total) by nebulization 2 (two) times daily. 08/14/14   Waymon Budge, MD  cefUROXime (CEFTIN) 500 MG tablet Take 1 tablet (500 mg total) by mouth 2 (two) times daily with a meal. 05/04/15   Adrian Saran, MD  Cholecalciferol (VITAMIN D3) 2000 units capsule Take 2,000 Units by mouth daily.    Historical Provider, MD  isosorbide mononitrate (IMDUR) 30 MG 24 hr tablet TAKE TWO TABLETS BY MOUTH DAILY 08/13/14   Lyn Records, MD  magnesium oxide (MAG-OX) 400 MG tablet Take 400 mg by mouth daily.     Historical Provider, MD  nitroGLYCERIN (NITROSTAT) 0.4 MG SL tablet Place 1 tablet (0.4 mg total) under the tongue every 5 (five) minutes as needed. Chest pain 04/27/13   Lyn Records, MD  omeprazole (PRILOSEC) 20 MG capsule TAKE ONE CAPSULE BY MOUTH DAILY 12/03/14   Dianne Dun, MD  sevelamer carbonate (RENVELA) 800 MG tablet Take 2,400 mg by mouth 3 (three) times daily with meals.    Historical Provider, MD   Physical Exam: Filed Vitals:   05/20/2015 1829 06/10/2015 1830 05/23/2015 1831 05/18/2015 1832  BP:  118/56    Pulse: 88 79 82 83  Temp:      TempSrc:      Resp: Height:      Weight:      SpO2: 100% 100% 100% 100%     General:  chronically ill appearing, in NAD  Eyes: PERRL, EOMI, no scleral icterus, conjunctiva clear  ENT:  Oropharynx dry, cracking, and red, dentition poor  Neck: supple, no lymphadenopathy, no bruits or thyromegaly  Cardiovascular: regular rate with occasional premature beat without MRG; 2+ peripheral pulses, no JVD, 2+  peripheral edema  Respiratory: decreased breath sounds with scattered rhonchi. No wheezes. Basilar rales noted  Abdomen: distended, non tender to palpation, decreased bowel sounds, no guarding, no rebound. Ascites noted  Skin: no rashes or lesions  Musculoskeletal: normal bulk and tone, no joint swelling  Psychiatric: normal mood and affect  Neurologic: CN 2-12 grossly intact, Motor strength 5/5 in all 4 groups, DTR's symmetric. Sensory exam non-focal  Labs on Admission:  Basic Metabolic Panel:  Recent Labs Lab 06/13/2015 1642  NA 137  K 2.7*  CL 98*  CO2 26  GLUCOSE 85  BUN 20  CREATININE 4.11*  CALCIUM 7.9*   Liver Function Tests:  Recent Labs Lab 13-Jun-2015 1642  AST 47*  ALT 41  ALKPHOS 290*  BILITOT 2.0*  PROT 5.0*  ALBUMIN 2.1*   No results for input(s): LIPASE, AMYLASE in the last 168 hours.  Recent Labs Lab 13-Jun-2015 1452  AMMONIA 101*   CBC:  Recent Labs Lab Jun 13, 2015 1452  WBC 8.8  NEUTROABS 6.7*  HGB 13.9  HCT 42.9  MCV 88.4  PLT 77*   Cardiac Enzymes:  Recent Labs Lab 13-Jun-2015 1642  TROPONINI 0.05*    BNP (last 3 results)  Recent Labs  13-Jun-2015 1642  BNP 151.0*    ProBNP (last 3 results)  Recent Labs  10/04/14 1224 10/21/14 1516   PROBNP 77.0 121.0*    CBG: No results for input(s): GLUCAP in the last 168 hours.  Radiological Exams on Admission: Dg Chest 2 View  2015/06/13  CLINICAL DATA:  Pt was at the dialysis center about to get her treatment and they checked her BP. Pt was hypotensive. Hx of COPD, CKD, CHF, CAD. HX of coronary stents. Former smoker. EXAM: CHEST  2 VIEW COMPARISON:  04/25/2015 FINDINGS: Right-sided dialysis catheter tip to the lower superior vena cava. Heart size is upper limits normal. Lungs are mildly hyperinflated. There are small bilateral pleural effusions. Perihilar bronchitic changes are probably chronic. There is bibasilar scarring or atelectasis. No consolidations or pulmonary edema. Subacute fracture of the left eighth rib. Remote wedge compression fracture at T12. IMPRESSION: 1. Cardiomegaly. 2. Bibasilar atelectasis or scarring and small effusions. 3. Bronchitic changes likely chronic. Electronically Signed   By: Norva Pavlov M.D.   On: 06-13-15 16:04    EKG: Independently reviewed.  Assessment/Plan Principal Problem:   Failure to thrive in adult Active Problems:   Chronic diastolic congestive heart failure, NYHA class 1 (HCC)   Chronic respiratory failure with hypoxia (HCC)   Hypotension   ESRD on dialysis [facility])   Will admit to floor as DNR with IV fluids, po K+, and O2. Consult CM for Hospice Home placement. Comfort measures for now.  Diet: soft Fluids: D5NS with K+@50  DVT Prophylaxis: none  Code Status: DNR  Family Communication: yes  Disposition Plan: Hospice Home  Time spent: 55 min

## 2015-05-17 NOTE — ED Provider Notes (Addendum)
[facility] Emergency Department Provider Note  ____________________________________________   I have reviewed the triage vital signs and the nursing notes.   HISTORY  Chief Complaint Hypotension    HPI Brandi Cline is a 82 y.o. female , most is a history is from her son who is power of attorney as well as her daughter. Patient lives at home has home oxygen. She has multiple very significant medical complaints at baseline including liver failure for which she gets routine paracentesis, CHF, COPD, home oxygen, and Crohn's disease with chronic diarrhea. Patient went to her dialysis today, her last dialysis was Thursday, they found her to be too lethargic and hypotensive to do the procedure. Patient has been getting gradually more somnolent over the last few weeks according to family. No fever or chills no cough no shortness of breath over her baseline. No change in her chronic diarrhea. No change in her chronic lower extremity edema and occasional chronic ascites. Herself cannot give a good history, speaks in a whisper and unable to answer most questions very somnolent but awake  Past Medical History  Diagnosis Date  . Dyspnea   . Esophageal reflux   . CAD (coronary artery disease)   . Hypertension   . Crohn's disease (HCC)   . Allergic rhinitis   . COPD (chronic obstructive pulmonary disease) (HCC)   . History of kidney stones   . History of shingles   . Trigeminal neuralgia   . Family history of anesthesia complication     " SISTER HAD BAD FEELINGS "  . Anginal pain (HCC)   . Anxiety     ' JUST FOR MY BREATHING "  . Arthritis     mild in hands  . Hyperlipidemia   . CKD (chronic kidney disease)   . Kidney stones   . CHF (congestive heart failure) (HCC)   . Anemia     Patient Active Problem List   Diagnosis Date Noted  . UTI (lower urinary tract infection) 05/03/2015  . Left-sided chest wall pain 04/25/2015  . Cough 04/10/2015  . Hearing loss in  left ear 04/02/2015  . ETD (eustachian tube dysfunction) 02/25/2015  . Abnormal chest CT   . Iron deficiency anemia, unspecified   . Heme + stool   . Intestinal bypass or anastomosis status   . Acute on chronic renal failure (HCC) 01/22/2015  . Cellulitis of leg, left 01/22/2015  . Acute renal failure (HCC) 01/10/2015  . Leg edema 10/21/2014  . Anemia 10/21/2014  . Abdominal pressure 10/21/2014  . Elevated serum creatinine 10/05/2014  . Thrombocytopenia (HCC) 10/03/2014  . Cystitis 09/07/2014  . Chronic respiratory failure with hypoxia (HCC) 09/03/2014  . Hyperglycemia 06/27/2014  . Elevated liver function tests 06/27/2014  . History of nephrolithiasis 01/10/2014  . Splenic varices 10/25/2013  . Cirrhosis of liver (HCC) 10/03/2013  . CKD (chronic kidney disease), stage II 09/20/2013  . Post herpetic neuralgia 06/13/2013  . Chronic diastolic congestive heart failure, NYHA class 1 (HCC) 04/25/2013  . Essential hypertension 03/21/2007  . Coronary atherosclerosis 03/21/2007  . ALLERGIC RHINITIS 03/21/2007  . COPD mixed type (HCC) 03/21/2007  . Esophageal reflux 03/21/2007  . Regional enteritis [facility]) 03/21/2007    Past Surgical History  Procedure Laterality Date  . Coronary stents    . Lithotripsy    . Ureteral stent placement    . Cholecystectomy    . Total abdominal hysterectomy    . Partial resections large and small bowel for crohn's    .  Breast lumps benign    . Left heart catheterization with coronary angiogram N/A 10/28/2011    Procedure: LEFT HEART CATHETERIZATION WITH CORONARY ANGIOGRAM;  Surgeon: Lesleigh Noe, MD;  Location: [facility] CATH LAB;  Service: Cardiovascular;  Laterality: N/A;  . Peripheral vascular catheterization N/A 01/31/2015    Procedure: Dialysis/Perma Catheter Insertion;  Surgeon: Renford Dills, MD;  Location: [facility] INVASIVE CV LAB;  Service: Cardiovascular;  Laterality: N/A;  . Endobronchial ultrasound N/A 02/05/2015    Procedure: ENDOBRONCHIAL  ULTRASOUND;  Surgeon: Stephanie Acre, MD;  Location: [facility] ORS;  Service: Cardiopulmonary;  Laterality: N/A;  . Esophagogastroduodenoscopy (egd) with propofol N/A 02/03/2015    Procedure: ESOPHAGOGASTRODUODENOSCOPY (EGD) WITH PROPOFOL;  Surgeon: Midge Minium, MD;  Location: [facility] ENDOSCOPY;  Service: Endoscopy;  Laterality: N/A;  Look into the colon and stomach with a light to look for the source of bleeding.  . Colonoscopy with propofol N/A 02/03/2015    Procedure: COLONOSCOPY WITH PROPOFOL;  Surgeon: Midge Minium, MD;  Location: [facility] ENDOSCOPY;  Service: Endoscopy;  Laterality: N/A;  . Av fistula placement Left 03/28/2015    Procedure: Creation of arteriovenous fistula left arm;  Surgeon: Renford Dills, MD;  Location: [facility] ORS;  Service: Vascular;  Laterality: Left;    Current Outpatient Rx  Name  Route  Sig  Dispense  Refill  . albuterol (PROVENTIL HFA;VENTOLIN HFA) 108 (90 BASE) MCG/ACT inhaler   Inhalation   Inhale 2 puffs into the lungs every 6 (six) hours as needed for wheezing or shortness of breath.   1 Inhaler   11     Ventolin HFA   . albuterol (PROVENTIL) (2.5 MG/3ML) 0.083% nebulizer solution   Nebulization   Take 3 mLs (2.5 mg total) by nebulization every 4 (four) hours as needed. Shortness of breath   75 mL   11   . ALPRAZolam (XANAX) 0.5 MG tablet      1 tab by mouth four times daily as needed for anxiety and or insomnia   120 tablet   0   . arformoterol (BROVANA) 15 MCG/2ML NEBU   Nebulization   Take 2 mLs (15 mcg total) by nebulization 2 (two) times daily.   60 mL   prn   . cefUROXime (CEFTIN) 500 MG tablet   Oral   Take 1 tablet (500 mg total) by mouth 2 (two) times daily with a meal.   20 tablet   0   . Cholecalciferol (VITAMIN D3) 2000 units capsule   Oral   Take 2,000 Units by mouth daily.         . isosorbide mononitrate (IMDUR) 30 MG 24 hr tablet      TAKE TWO TABLETS BY MOUTH DAILY   60 tablet   11   . magnesium oxide (MAG-OX) 400 MG  tablet   Oral   Take 400 mg by mouth daily.          . nitroGLYCERIN (NITROSTAT) 0.4 MG SL tablet   Sublingual   Place 1 tablet (0.4 mg total) under the tongue every 5 (five) minutes as needed. Chest pain   25 tablet   3   . omeprazole (PRILOSEC) 20 MG capsule      TAKE ONE CAPSULE BY MOUTH DAILY   90 capsule   3   . sevelamer carbonate (RENVELA) 800 MG tablet   Oral   Take 2,400 mg by mouth 3 (three) times daily with meals.           Allergies Avelox;  Codeine; Theophyllines; Ciprofloxacin; Milk-related compounds; Hydrocodone; Tape; and Lisinopril  Family History  Problem Relation Age of Onset  . Breast cancer Sister   . COPD Sister   . Stroke Mother   . Prostate cancer Father   . Prostate cancer Brother   . Heart attack Brother     Social History Social History  Substance Use Topics  . Smoking status: Former Smoker -- 1.00 packs/day for 30 years    Types: Cigarettes    Quit date: 04/12/1990  . Smokeless tobacco: Never Used  . Alcohol Use: No    Review of Systems See history of present illness most of this is per family some help from patient Constitutional: No fever/chills Eyes: No visual changes. ENT: No sore throat. No stiff neck no neck pain Cardiovascular: Denies chest pain. Respiratory: Positive chronic shortness of breath. Gastrointestinal:   no vomiting.  Ulcerative chronic chronic diarrhea.  No constipation. Genitourinary: Patient does not make urine Musculoskeletal: Negative lower extremity swelling Skin: Negative for rash. Neurological: Negative for headaches, focal weakness or numbness. 10-point ROS otherwise negative.  ____________________________________________   PHYSICAL EXAM:  VITAL SIGNS: ED Triage Vitals  Enc Vitals Group     BP 06/05/2015 1439 117/40 mmHg     Pulse Rate 06/05/2015 1440 79     Resp 06/02/2015 1438 13     Temp 05/30/2015 1440 97.3 F (36.3 C)     Temp Source 05/16/2015 1440 Axillary     SpO2 06/06/2015 1440 100 %      Weight 05/22/2015 1440 153 lb 6.4 oz (69.582 kg)     Height 05/26/2015 1440 5\' 3"  (1.6 m)     Head Cir --      Peak Flow --      Pain Score 05/26/2015 1447 0     Pain Loc --      Pain Edu? --      Excl. in GC? --     Constitutional: Chronically ill and a fragile appearing woman who is somnolent but awake with her eyes open. Follow [place] commands. Eyes: Conjunctivae are normal. PERRL. EOMI. Head: Atraumatic. Nose: No congestion/rhinnorhea. Mouth/Throat: Mucous membranes are dry.  Oropharynx non-erythematous. Neck: No stridor.   Nontender with no meningismus Cardiovascular: Normal rate, regular rhythm. Grossly normal heart sounds.  Good peripheral circulation. Respiratory: Normal respiratory effort.  No retractions. Diminished in the bases Abdominal: Soft and nontender. Studies appreciated No guarding no rebound Back:  There is no focal tenderness or step off there is no midline tenderness there are no lesions noted. there is no CVA tenderness Musculoskeletal: No lower extremity tenderness. No joint effusions, no DVT signs strong distal pulses significant pitting doughy bilateral edema Neurologic:  Normal speech and language. No gross focal neurologic deficits are appreciated.  Skin:  Skin is warm, dry and intact. Diffuse bruising noted as versus the left upper extremity Psychiatric: Mood and affect are normal. Speech and behavior are normal.  ____________________________________________   LABS (all labs ordered are listed, but only abnormal results are displayed)  Labs Reviewed  CBC WITH DIFFERENTIAL/PLATELET - Abnormal; Notable for the following:    RDW 20.7 (*)    Platelets 77 (*)    Neutro Abs 6.7 (*)    All other components within normal limits  AMMONIA - Abnormal; Notable for the following:    Ammonia 101 (*)    All other components within normal limits  BRAIN NATRIURETIC PEPTIDE   ____________________________________________  EKG  I personally interpreted any EKGs ordered by  me or triage Atrial fibrillation, PVCs noted, no acute ST elevation or depression, nonspecific ST changes rate 88 ____________________________________________  RADIOLOGY  I reviewed any imaging ordered by me or triage that were performed during my shift ____________________________________________   PROCEDURES  Procedure(s) performed: None  Critical Care performed: CRITICAL CARE Performed by: Jeanmarie Plant   Total critical care time: 45 minutes  Critical care time was exclusive of separately billable procedures and treating other patients.  Critical care was necessary to treat or prevent imminent or life-threatening deterioration.  Critical care was time spent personally by me on the following activities: development of treatment plan with patient and/or surrogate as well as nursing, discussions with consultants, evaluation of patient's response to treatment, examination of patient, obtaining history from patient or surrogate, ordering and performing treatments and interventions, ordering and review of laboratory studies, ordering and review of radiographic studies, pulse oximetry and re-evaluation of patient's condition.   ____________________________________________   INITIAL IMPRESSION / ASSESSMENT AND PLAN / ED COURSE  Pertinent labs & imaging results that were available during my care of the patient were reviewed by me and considered in my medical decision making (see chart for details). Patient with multiple end organ system pathologies noted including chronic liver failure chronic heart failure chronic pulmonary failure on oxygen, chronic renal failure on dialysis who presents with increasing somnolence and who was to hypotensive earlier to get dialysis. Her pressures are better here. Unclear what the etiology was initially. I had a long discussion with the family. Patient is DNR/DNI. I feel that end-of-life care is appropriate for this patient given her inability have  dialysis today, her elevated ammonia chronic diarrhea and multiorgan system failure. Family is in complete agreement with this plan. I put them in touch with hospice. This is in accordance with the patient's desires as well. They are discussing with the hospice nurse now. In the meantime they would likely to defer giving lactulose for the patient's elevated ammonia because they do not feel that giving the patient diarrhea would be her best interest. They do understand that does limit my ability to reverse some of her hyperammonemia. Patient vital signs are reassuring. Workup thus far is pending blood work which had to be redrawn. Patient has very poor IV access. Depending on the family's decision about withdrawing care we will proceed in conjunction with them to facilitate this patient's end-of-life care. I will also give a small aliquot of IV fluid is likely will be third spaced as does appear intravascularly depleted.  ----------------------------------------- 5:30 PM on 05/25/2015 -----------------------------------------  Many different blood draws hemolyzed, lab was able to get a good sample of a tummy and they are running it now. Vital signs are reassuring awaiting further input from family and blood work.  ----------------------------------------- 6:30 PM on 05/20/2015 -----------------------------------------  Family does not wish the patient have lactulose because her concerned about the difficulty with diarrhea. They feel the patient should be mostly comfort care, they do wish me to replace her potassium. I cannot give her potassium through her tiny 24 IV &  do not wish further IV sticks. Patient will ultimately go it is hoped to the hospice house and this is what they're trying to work out but that cannot happen tonight. I talked to Dr. Judithann Sheen who was kind enough to accept the patient was service for comfort and palliative care essentially until further disposition can be  made.  ____________________________________________   FINAL CLINICAL IMPRESSION(S) / ED DIAGNOSES  Final diagnoses:  None      This chart was dictated using voice recognition software.  Despite best efforts to proofread,  errors can occur which can change meaning.     Jeanmarie Plant, MD 2015-06-14 1641  Jeanmarie Plant, MD 2015/06/14 1643  Jeanmarie Plant, MD 06-14-15 1730  Jeanmarie Plant, MD 06/14/15 1831  Jeanmarie Plant, MD 2015-06-14 4098  Jeanmarie Plant, MD 05/18/15 [phone]

## 2015-05-18 LAB — COMPREHENSIVE METABOLIC PANEL
ALK PHOS: 278 U/L — AB (ref 38–126)
ALT: 42 U/L (ref 14–54)
AST: 42 U/L — ABNORMAL HIGH (ref 15–41)
Albumin: 2.1 g/dL — ABNORMAL LOW (ref 3.5–5.0)
Anion gap: 12 (ref 5–15)
BILIRUBIN TOTAL: 2.1 mg/dL — AB (ref 0.3–1.2)
BUN: 23 mg/dL — ABNORMAL HIGH (ref 6–20)
CALCIUM: 8.4 mg/dL — AB (ref 8.9–10.3)
CO2: 26 mmol/L (ref 22–32)
CREATININE: 4.87 mg/dL — AB (ref 0.44–1.00)
Chloride: 102 mmol/L (ref 101–111)
GFR calc non Af Amer: 8 mL/min — ABNORMAL LOW (ref >60)
GFR, EST AFRICAN AMERICAN: 9 mL/min — AB (ref >60)
GLUCOSE: 92 mg/dL (ref 65–99)
Potassium: 3.6 mmol/L (ref 3.5–5.1)
SODIUM: 140 mmol/L (ref 135–145)
TOTAL PROTEIN: 5.1 g/dL — AB (ref 6.5–8.1)

## 2015-05-18 LAB — C DIFFICILE QUICK SCREEN W PCR REFLEX
C DIFFICLE (CDIFF) ANTIGEN: NEGATIVE
C Diff interpretation: NEGATIVE
C Diff toxin: NEGATIVE

## 2015-05-18 LAB — CBC
HEMATOCRIT: 37.8 % (ref 35.0–47.0)
HEMOGLOBIN: 12.1 g/dL (ref 12.0–16.0)
MCH: 28.4 pg (ref 26.0–34.0)
MCHC: 31.9 g/dL — AB (ref 32.0–36.0)
MCV: 88.9 fL (ref 80.0–100.0)
Platelets: 100 10*3/uL — ABNORMAL LOW (ref 150–440)
RBC: 4.25 MIL/uL (ref 3.80–5.20)
RDW: 21.4 % — ABNORMAL HIGH (ref 11.5–14.5)
WBC: 7.8 10*3/uL (ref 3.6–11.0)

## 2015-05-18 LAB — PROTIME-INR
INR: 3.28
Prothrombin Time: 32.7 seconds — ABNORMAL HIGH (ref 11.4–15.0)

## 2015-05-18 MED ORDER — MORPHINE SULFATE (CONCENTRATE) 10 MG/0.5ML PO SOLN
10.0000 mg | ORAL | Status: DC | PRN
  Administered 2015-05-19: 10 mg via ORAL
  Filled 2015-05-18 (×2): qty 1

## 2015-05-18 MED ORDER — LOPERAMIDE HCL 2 MG PO CAPS
2.0000 mg | ORAL_CAPSULE | Freq: Four times a day (QID) | ORAL | Status: DC | PRN
  Administered 2015-05-18 – 2015-05-19 (×2): 2 mg via ORAL
  Filled 2015-05-18 (×2): qty 1

## 2015-05-18 MED ORDER — CHLORHEXIDINE GLUCONATE 0.12 % MT SOLN
15.0000 mL | Freq: Two times a day (BID) | OROMUCOSAL | Status: DC
  Administered 2015-05-18 – 2015-05-19 (×3): 15 mL via OROMUCOSAL
  Filled 2015-05-18 (×3): qty 15

## 2015-05-18 MED ORDER — CETYLPYRIDINIUM CHLORIDE 0.05 % MT LIQD
7.0000 mL | Freq: Two times a day (BID) | OROMUCOSAL | Status: DC
  Administered 2015-05-18 (×2): 7 mL via OROMUCOSAL

## 2015-05-18 NOTE — Progress Notes (Signed)
Patient ID: Brandi Cline, female   DOB: Dec 17, 1930, 82 y.o.   MRN: [id number]  Face to face with patient and family  They are interested in hospice home in [place]. They will stop dialysis Patient having some abdominal pain.  Roxanol ordered.  Dr Renae Gloss.  Another 

## 2015-05-18 NOTE — Progress Notes (Signed)
Patient ID: Brandi Cline, female   DOB: 08/27/1930, 82 y.o.   MRN: [id number] [facility] PROGRESS NOTE  Brandi Cline:[id number] DOB: 01/18/1931 DOA: 05/28/2015 PCP: [name], MD  HPI/Subjective: Patient feels okay. Was sent in from dialysis for hypotension and hypokalemia. Family was thinking about comfort care measures. Patient states that she wants to continue dialysis. I will have family talk again with the patient about goals of care.  Objective: Filed Vitals:   05/18/15 0036 05/18/15 0555  BP: 124/45 109/49  Pulse: 82 87  Temp: 97.4 F (36.3 C)   Resp: 16     Filed Weights   06/01/2015 1440 05/31/2015 2034  Weight: 69.582 kg (153 lb 6.4 oz) 63.1 kg (139 lb 1.8 oz)    ROS: Review of Systems  Constitutional: Negative for fever and chills.  Eyes: Negative for blurred vision.  Respiratory: Negative for cough and shortness of breath.   Cardiovascular: Negative for chest pain.  Gastrointestinal: Negative for nausea, vomiting, abdominal pain, diarrhea and constipation.  Genitourinary: Negative for dysuria.  Musculoskeletal: Negative for joint pain.  Neurological: Positive for weakness. Negative for dizziness and headaches.   Exam: Physical Exam  HENT:  Nose: No mucosal edema.  Mouth/Throat: No oropharyngeal exudate or posterior oropharyngeal edema.  Eyes: Conjunctivae, EOM and lids are normal. Pupils are equal, round, and reactive to light.  Neck: No JVD present. Carotid bruit is not present. No edema present. No thyroid mass and no thyromegaly present.  Cardiovascular: S1 normal and S2 normal.  Exam reveals no gallop.   No murmur heard. Pulses:      Dorsalis pedis pulses are 2+ on the right side, and 2+ on the left side.  Respiratory: No respiratory distress. She has no wheezes. She has no rhonchi. She has no rales.  GI: Soft. Bowel sounds are normal. There is no tenderness.  Musculoskeletal:       Right shoulder: She exhibits no swelling.  Lymphadenopathy:     She has no cervical adenopathy.  Neurological: She is alert. No cranial nerve deficit.  Skin: Skin is warm. No rash noted. Nails show no clubbing.  Psychiatric: She has a normal mood and affect.      Data Reviewed: Basic Metabolic Panel:  Recent Labs Lab 05/26/2015 1642 05/18/15 0454  NA 137 140  K 2.7* 3.6  CL 98* 102  CO2 26 26  GLUCOSE 85 92  BUN 20 23*  CREATININE 4.11* 4.87*  CALCIUM 7.9* 8.4*   Liver Function Tests:  Recent Labs Lab 05/20/2015 1642 05/18/15 0454  AST 47* 42*  ALT 41 42  ALKPHOS 290* 278*  BILITOT 2.0* 2.1*  PROT 5.0* 5.1*  ALBUMIN 2.1* 2.1*    Recent Labs Lab 05/28/2015 1452  AMMONIA 101*   CBC:  Recent Labs Lab 06/09/2015 1452 05/18/15 0454  WBC 8.8 7.8  NEUTROABS 6.7*  --   HGB 13.9 12.1  HCT 42.9 37.8  MCV 88.4 88.9  PLT 77* 100*   Cardiac Enzymes:  Recent Labs Lab 06/08/2015 1642  TROPONINI 0.05*   BNP (last 3 results)  Recent Labs  05/28/2015 1642  BNP 151.0*    ProBNP (last 3 results)  Recent Labs  10/04/14 1224 10/21/14 1516  PROBNP 77.0 121.0*      Studies: Dg Chest 2 View  05/18/2015  CLINICAL DATA:  Pt was at the dialysis center about to get her treatment and they checked her BP. Pt was hypotensive. Hx of COPD, CKD, CHF, CAD. HX  of coronary stents. Former smoker. EXAM: CHEST  2 VIEW COMPARISON:  04/25/2015 FINDINGS: Right-sided dialysis catheter tip to the lower superior vena cava. Heart size is upper limits normal. Lungs are mildly hyperinflated. There are small bilateral pleural effusions. Perihilar bronchitic changes are probably chronic. There is bibasilar scarring or atelectasis. No consolidations or pulmonary edema. Subacute fracture of the left eighth rib. Remote wedge compression fracture at T12. IMPRESSION: 1. Cardiomegaly. 2. Bibasilar atelectasis or scarring and small effusions. 3. Bronchitic changes likely chronic. Electronically Signed   By: Norva Pavlov M.D.   On: 05/29/15 16:04     Scheduled Meds: . antiseptic oral rinse  7 mL Mouth Rinse q12n4p  . chlorhexidine  15 mL Mouth Rinse BID  . potassium chloride  20 mEq Oral BID   Continuous Infusions: . dextrose 5 % and 0.9 % NaCl with KCl 20 mEq/L 50 mL/hr at 2015-05-29 1936    Assessment/Plan:  1. Hypotension. Patient's blood pressure is better today. 2. Severe hypokalemia. This has been replaced. I will stop IV fluids. 3. End-stage renal disease on dialysis. Family to discuss with patient on whether she wants to continue dialysis or not. If she does not want to continue dialysis then hospice would be appropriate. I will have to get a nephrology consultation for Tuesday for dialysis if they're going to continue with dialysis. 4. Failure to thrive 5. Weakness- we'll get physical therapy evaluation 6. Chronic respiratory failure with hypoxia on oxygen 7. History of CAD and CHF- currently no signs of congestive heart failure.  Code Status:     Code Status Orders        Start     Ordered   May 29, 2015 2227  Do not attempt resuscitation (DNR)   Continuous    Question Answer Comment  In the event of cardiac or respiratory ARREST Do not call a "code blue"   In the event of cardiac or respiratory ARREST Do not perform Intubation, CPR, defibrillation or ACLS   In the event of cardiac or respiratory ARREST Use medication by any route, position, wound care, and other measures to relive pain and suffering. May use oxygen, suction and manual treatment of airway obstruction as needed for comfort.      05/29/15 2226    Code Status History    Date Active Date Inactive Code Status Order ID Comments User Context   05/03/2015  4:06 PM 05/04/2015  5:58 PM Full Code [id number]  Katha Hamming, MD ED   01/22/2015  4:41 PM 02/11/2015  8:58 PM DNR [id number]  Gale Journey, MD Inpatient   01/22/2015 12:44 PM 01/22/2015  4:41 PM Full Code [id number]  Gale Journey, MD Inpatient   01/10/2015 12:47 PM 01/15/2015  3:27 PM Full  Code [id number]  Auburn Bilberry, MD Inpatient   09/19/2013 10:56 PM 09/23/2013  8:12 PM Full Code [id number]  Doree Albee, MD ED   05/13/2012  3:32 PM 05/15/2012  4:55 PM Full Code [id number]  Artelia Laroche, RN Inpatient   05/13/2012 10:24 AM 05/13/2012  3:32 PM Full Code [id number]  Hewitt Shorts, RN ED   10/05/2011  9:35 PM 10/08/2011  2:55 PM Full Code [id number]  Idelle Jo., RN ED    Advance Directive Documentation        Most Recent Value   Type of Advance Directive  Living will   Pre-existing out of facility DNR order (yellow form or pink MOST form)     "MOST" Form in Place?  Family Communication: Spoke with son on phone, he will get back to me on whether or not to continue dialysis or not. Disposition Plan: To be determined based on continuing dialysis or not  Time spent: 25 minutes  Alford Highland  [facility]

## 2015-06-02 ENCOUNTER — Ambulatory Visit: Payer: Medicare HMO | Admitting: Family Medicine

## 2015-06-11 NOTE — Discharge Summary (Signed)
[facility] [place] [facility]   PATIENT NAME: Brandi Cline    MR#:  [id number]  DATE OF BIRTH:  1930/09/22  DATE OF ADMISSION:  June 12, 2015 ADMITTING PHYSICIAN: Marguarite Arbour, MD  DATE OF Death: Jun 14, 2015  PRIMARY CARE PHYSICIAN: [name], MD    ADMISSION DIAGNOSIS:  Hepatic encephalopathy (HCC) [K72.90] Hypokalemia [E87.6] Chronic liver failure without hepatic coma (HCC) [K72.10]  DISCHARGE DIAGNOSIS:  Principal Problem:   Failure to thrive in adult Active Problems:   Chronic diastolic congestive heart failure, NYHA class 1 (HCC)   Chronic respiratory failure with hypoxia (HCC)   Hypotension   ESRD on dialysis (HCC)   CHF (congestive heart failure) (HCC)   SECONDARY DIAGNOSIS:   Past Medical History  Diagnosis Date  . Dyspnea   . Esophageal reflux   . CAD (coronary artery disease)   . Hypertension   . Crohn's disease (HCC)   . Allergic rhinitis   . COPD (chronic obstructive pulmonary disease) (HCC)   . History of kidney stones   . History of shingles   . Trigeminal neuralgia   . Family history of anesthesia complication     " SISTER HAD BAD FEELINGS "  . Anginal pain (HCC)   . Anxiety     ' JUST FOR MY BREATHING "  . Arthritis     mild in hands  . Hyperlipidemia   . CKD (chronic kidney disease)   . Kidney stones   . CHF (congestive heart failure) (HCC)   . Anemia     HOSPITAL COURSE:   1. Hypotension unspecified - patient was placed on IV fluids. 2. Hypokalemia which was severe- patient was given potassium replacement 3. End-stage renal disease- patient having trouble with tolerating dialysis secondary to hypotension and other medical issues. Patient and family decided not to continue dialysis. 4. Failure to thrive and unable to take care of herself. 5. Weakness 6. Chronic respiratory failure with hypoxia on oxygen 7. History of CAD and congestive heart failure 8. Cirrhosis with hepatic encephalopathy  Patient was  made comfort care and was on the wait list to go to hospice but she decompensated on Jun 14, 2015 in the afternoon. Patient was given Roxanol for pain. Patient had increased respirations and decreased pulse and passed away at 2:38 PM.   DRUG ALLERGIES:   Allergies  Allergen Reactions  . Avelox [Moxifloxacin Hcl In Nacl] Shortness Of Breath, Swelling and Rash  . Codeine Shortness Of Breath and Other (See Comments)    Couldn't breath  . Theophyllines     Slight increased heart rate  . Ciprofloxacin Other (See Comments)    nervous  . Milk-Related Compounds Other (See Comments)    Whole milk hurts stomach. Not all dairy products do this.  . Hydrocodone Other (See Comments)    "messes with my head."  . Tape     Please use paper tape only  . Lisinopril Rash      DATA REVIEW:   CBC  Recent Labs Lab 05/18/15 0454  WBC 7.8  HGB 12.1  HCT 37.8  PLT 100*    Chemistries   Recent Labs Lab 05/18/15 0454  NA 140  K 3.6  CL 102  CO2 26  GLUCOSE 92  BUN 23*  CREATININE 4.87*  CALCIUM 8.4*  AST 42*  ALT 42  ALKPHOS 278*  BILITOT 2.1*    Cardiac Enzymes  Recent Labs Lab 2015-06-12 1642  TROPONINI 0.05*    Microbiology Results  Results for  orders placed or performed during the hospital encounter of 05/18/2015  C difficile quick scan w PCR reflex     Status: None   Collection Time: 05/18/15  8:13 AM  Result Value Ref Range Status   C Diff antigen NEGATIVE NEGATIVE Final   C Diff toxin NEGATIVE NEGATIVE Final   C Diff interpretation Negative for C. difficile  Final    RADIOLOGY:  Dg Chest 2 View  05/22/2015  CLINICAL DATA:  Pt was at the dialysis center about to get her treatment and they checked her BP. Pt was hypotensive. Hx of COPD, CKD, CHF, CAD. HX of coronary stents. Former smoker. EXAM: CHEST  2 VIEW COMPARISON:  04/25/2015 FINDINGS: Right-sided dialysis catheter tip to the lower superior vena cava. Heart size is upper limits normal. Lungs are mildly  hyperinflated. There are small bilateral pleural effusions. Perihilar bronchitic changes are probably chronic. There is bibasilar scarring or atelectasis. No consolidations or pulmonary edema. Subacute fracture of the left eighth rib. Remote wedge compression fracture at T12. IMPRESSION: 1. Cardiomegaly. 2. Bibasilar atelectasis or scarring and small effusions. 3. Bronchitic changes likely chronic. Electronically Signed   By: Norva Pavlov M.D.   On: 05/23/2015 16:04    Management plans discussed with the patient, family and they are in agreement.  CODE STATUS:     Code Status Orders        Start     Ordered   05/27/2015 2227  Do not attempt resuscitation (DNR)   Continuous    Question Answer Comment  In the event of cardiac or respiratory ARREST Do not call a "code blue"   In the event of cardiac or respiratory ARREST Do not perform Intubation, CPR, defibrillation or ACLS   In the event of cardiac or respiratory ARREST Use medication by any route, position, wound care, and other measures to relive pain and suffering. May use oxygen, suction and manual treatment of airway obstruction as needed for comfort.      05/30/2015 2226    Code Status History    Date Active Date Inactive Code Status Order ID Comments User Context   05/03/2015  4:06 PM 05/04/2015  5:58 PM Full Code [id number]  Katha Hamming, MD ED   01/22/2015  4:41 PM 02/11/2015  8:58 PM DNR [id number]  Gale Journey, MD Inpatient   01/22/2015 12:44 PM 01/22/2015  4:41 PM Full Code [id number]  Gale Journey, MD Inpatient   01/10/2015 12:47 PM 01/15/2015  3:27 PM Full Code [id number]  Auburn Bilberry, MD Inpatient   09/19/2013 10:56 PM 09/23/2013  8:12 PM Full Code [id number]  Doree Albee, MD ED   05/13/2012  3:32 PM 05/15/2012  4:55 PM Full Code [id number]  Artelia Laroche, RN Inpatient   05/13/2012 10:24 AM 05/13/2012  3:32 PM Full Code [id number]  Hewitt Shorts, RN ED   10/05/2011  9:35 PM 10/08/2011  2:55 PM Full Code [id number]   Idelle Jo., RN ED    Advance Directive Documentation        Most Recent Value   Type of Advance Directive  Living will   Pre-existing out of facility DNR order (yellow form or pink MOST form)     "MOST" Form in Place?        TOTAL TIME TAKING CARE OF THIS PATIENT: 35 minutes.    Alford Highland M.D on June 08, 2015 at 2:55 PM  Between 7am to 6pm - Pager - [phone]  After 6pm go to [email or web address] -  password [email or web address] [facility]  [facility]  Office  [phone]  CC: Primary care physician; [name], MD

## 2015-06-11 NOTE — Progress Notes (Signed)
Patient ID: Brandi Cline, female   DOB: [birth year], 82 y.o.   MRN: [id number] [facility] PROGRESS NOTE  DHANA TOTTON QIO:[id number] DOB: January 03, 1931 DOA: 2015-05-28 PCP: [name], MD  HPI/Subjective: Patient was seen earlier this morning. She felt more lethargic today. She's felt that she slept okay. I did talk to her this morning about taking the Roxanol to help out with her pain. As per the nurse she took the Roxanol and did better. This afternoon she had a change in her status and pulse went down and respirations are up and she is less responsive. I called into the room and the daughter was there and other family members are on the way.  Objective: Filed Vitals:   05/18/15 2022 06/08/2015 0601  BP: 120/40 96/79  Pulse: 88 82  Temp: 97.7 F (36.5 C) 97.4 F (36.3 C)  Resp: 20 20    Filed Weights   28-May-2015 1440 2015/05/28 2034  Weight: 69.582 kg (153 lb 6.4 oz) 63.1 kg (139 lb 1.8 oz)    ROS: Review of Systems  Constitutional: Negative for fever and chills.  Eyes: Negative for blurred vision.  Respiratory: Negative for shortness of breath.   Cardiovascular: Negative for chest pain.  Gastrointestinal: Positive for abdominal pain and diarrhea. Negative for nausea and vomiting.  Musculoskeletal: Negative for joint pain.  Neurological: Positive for weakness. Negative for dizziness and headaches.   Exam: Physical Exam  Constitutional: She appears lethargic.  HENT:  Nose: No mucosal edema.  Mouth/Throat: No oropharyngeal exudate or posterior oropharyngeal edema.  Eyes: Conjunctivae, EOM and lids are normal. Pupils are equal, round, and reactive to light.  Neck: No JVD present. Carotid bruit is not present. No edema present. No thyroid mass and no thyromegaly present.  Cardiovascular: S1 normal and S2 normal.  Exam reveals no gallop.   No murmur heard. Pulses:      Dorsalis pedis pulses are 2+ on the right side, and 2+ on the left side.  Respiratory: No respiratory  distress. She has no wheezes. She has no rhonchi. She has no rales.  GI: Soft. Bowel sounds are normal. There is no tenderness.  Musculoskeletal:       Right ankle: She exhibits swelling.       Left ankle: She exhibits swelling.  Lymphadenopathy:    She has no cervical adenopathy.  Neurological: She appears lethargic.  Skin: Skin is warm. No rash noted. Nails show no clubbing.  Psychiatric: She has a normal mood and affect.      Data Reviewed: Basic Metabolic Panel:  Recent Labs Lab 05-28-2015 1642 05/18/15 0454  NA 137 140  K 2.7* 3.6  CL 98* 102  CO2 26 26  GLUCOSE 85 92  BUN 20 23*  CREATININE 4.11* 4.87*  CALCIUM 7.9* 8.4*   Liver Function Tests:  Recent Labs Lab 2015-05-28 1642 05/18/15 0454  AST 47* 42*  ALT 41 42  ALKPHOS 290* 278*  BILITOT 2.0* 2.1*  PROT 5.0* 5.1*  ALBUMIN 2.1* 2.1*    Recent Labs Lab 05/28/15 1452  AMMONIA 101*   CBC:  Recent Labs Lab 05-28-2015 1452 05/18/15 0454  WBC 8.8 7.8  NEUTROABS 6.7*  --   HGB 13.9 12.1  HCT 42.9 37.8  MCV 88.4 88.9  PLT 77* 100*   Cardiac Enzymes:  Recent Labs Lab 05/28/15 1642  TROPONINI 0.05*   BNP (last 3 results)  Recent Labs  May 28, 2015 1642  BNP 151.0*    ProBNP (last 3 results)  Recent Labs  10/04/14 1224 10/21/14 1516  PROBNP 77.0 121.0*      Studies: Dg Chest 2 View  May 23, 2015  CLINICAL DATA:  Pt was at the dialysis center about to get her treatment and they checked her BP. Pt was hypotensive. Hx of COPD, CKD, CHF, CAD. HX of coronary stents. Former smoker. EXAM: CHEST  2 VIEW COMPARISON:  04/25/2015 FINDINGS: Right-sided dialysis catheter tip to the lower superior vena cava. Heart size is upper limits normal. Lungs are mildly hyperinflated. There are small bilateral pleural effusions. Perihilar bronchitic changes are probably chronic. There is bibasilar scarring or atelectasis. No consolidations or pulmonary edema. Subacute fracture of the left eighth rib. Remote wedge  compression fracture at T12. IMPRESSION: 1. Cardiomegaly. 2. Bibasilar atelectasis or scarring and small effusions. 3. Bronchitic changes likely chronic. Electronically Signed   By: Norva Pavlov M.D.   On: 05/23/15 16:04    Scheduled Meds: . antiseptic oral rinse  7 mL Mouth Rinse q12n4p  . chlorhexidine  15 mL Mouth Rinse BID    Assessment/Plan:  1. Hypotension. 2. Severe hypokalemia.  3. End-stage renal disease and now stopping dialysis 4. Failure to thrive 5. Weakness- we'll get physical therapy evaluation 6. Chronic respiratory failure with hypoxia on oxygen 7. History of CAD and CHF  Patient this afternoon had a change in status with pulse going down and respirations increasing and less responsive. I spoke with the daughter and told her that things may be close to her passing away. She is on the hospice home waiting list at this point. I am hesitant about transferring her out of the hospital at this point since things may be close. The patient's daughter is okay with this. Roxanol and morphine as needed.  Code Status:     Code Status Orders        Start     Ordered   05/23/15 2227  Do not attempt resuscitation (DNR)   Continuous    Question Answer Comment  In the event of cardiac or respiratory ARREST Do not call a "code blue"   In the event of cardiac or respiratory ARREST Do not perform Intubation, CPR, defibrillation or ACLS   In the event of cardiac or respiratory ARREST Use medication by any route, position, wound care, and other measures to relive pain and suffering. May use oxygen, suction and manual treatment of airway obstruction as needed for comfort.      2015/05/23 2226    Advance Directive Documentation        Most Recent Value   Type of Advance Directive  Living will   Pre-existing out of facility DNR order (yellow form or pink MOST form)     "MOST" Form in Place?       Family Communication: Spoke with the daughter on the phone Disposition Plan:  Since she's had a change in status this afternoon, we will likely keep here today and reassess things tomorrow. She is currently on the waiting list to the hospice home.  Time spent: 25 minutes  Alford Highland  [facility]

## 2015-06-11 NOTE — Care Management (Signed)
RNCM consult placed. Patient is being transitioned to comfort care.  Family has requested Hospice Home in [place].  CSW has contacted [facility].  RNCM signing off

## 2015-06-11 NOTE — Progress Notes (Signed)
Pt awaiting transport for hospice care.  Pt DNR.  Pt daughter came to bedside this morning.  Pt discussed taking Roxanol with Dr. Renae Gloss as prior to taking she was hesitant.  Pt c/o stomach pain therefore Roxanol  oral was administered.  Pt also c/o feeling jittery and anxious, therefore  0.5 mg oral Xanax was administered along with  Zofran IV given for pt c/o nausea as well.  Pt was re-assessed about 30 minutes after administration of medicaions and was able to verbalize that pain and nausea was better.  Pt was found to be resting comfortably as well, with daughter lying beside her in the bed.    Approximately 2 hours after medication administration I re-assessed pt for pain control.  Pt was sleepy and lethargic and slightly responsive to name.  The daughter requested for pt to have more pain medication, however after careful assessment with the charge nurse, and a check of pts vital signs, the reflection was that pt was beginning to pass.  At this time, the pain Darnell Level was held and Dr. Renae Gloss was informed of pt change in status and VS.  The daughter was informed by Dr. Renae Gloss to contact family members if needed.  Pt was re-assessed approximately 30-45 minutes after this and found to have a non-palpable pulse and agonal breathing.  Pt was unresponsive and was not blinking her eyes.  I notified Dr. Renae Gloss who visited the pt and family.  The charge nurse and nursing supervisor were also informed.   Post mortem care was performed.

## 2015-06-11 NOTE — Progress Notes (Signed)
MD informed CSW that patient's daughter has requested the Hospice Home in [place].  Call to [facility]. Spoke to [employer] [phone] No beds at this time.   Carley Hammed will review patient's information in Epic and call CSW.  Call to patient's daughter Brandi Cline [phone] to provide an update.  Left message for her to call CSW.  Sammuel Hines. LCSWA Clinical Social Work Department [phone] 8:51 AM

## 2015-06-11 NOTE — Progress Notes (Signed)
Patient ID: Brandi Cline, female   DOB: 1930/05/29, 82 y.o.   MRN: [id number]  Patient pronounced dead at 2:38 PM. No spontaneous heartbeat or respirations  Patient's sisters and daughter at the bedside. They thanked me for my care.  Dictated by Dr. Alford Highland

## 2015-06-11 NOTE — Progress Notes (Signed)
Nutrition Brief Note  Chart reviewed secondary to positive screen from Malnutrition Screening Tool. Pt now transitioning to comfort care.  No further nutrition interventions warranted at this time.  Please re-consult as needed.   Yarnell Arvidson B. Freida Busman, RD, LDN [phone] (pager) Weekend/On-Call pager [phone])

## 2015-06-11 NOTE — Progress Notes (Signed)
CSW met with patient's daughter Bobetta Lime in [facility] room to discuss Hospice Home referral process and informed her that patient is currently on the wait list for [facility] in [place].  Daughter was appreciative of meeting with CSW.  Casimer Lanius. [facility] Work Department [phone] 11:45 AM

## 2015-06-11 DEATH — deceased

## 2015-08-25 ENCOUNTER — Ambulatory Visit: Payer: Medicare HMO | Admitting: Internal Medicine

## 2015-09-26 ENCOUNTER — Other Ambulatory Visit: Payer: Self-pay | Admitting: Nurse Practitioner

## 2016-08-04 IMAGING — US US BIOPSY
1 series · 14 of 24 positions shown · non-contrast
Comparison: none

EXAM:
ULTRASOUND GUIDED CORE BIOPSY OF KIDNEY

MEDICATIONS:
Op note
PROCEDURE:
Please refer to the dictated operative report for full details of
intraoperative findings and procedure.

[Series 1: us biopsy · 0.19mm/px · 14 of 24 slices shown]
[im 1/24]
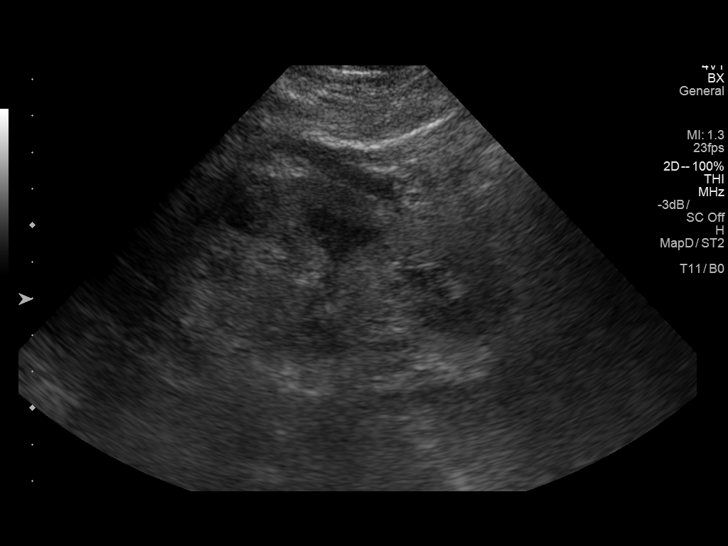
[im 3/24]
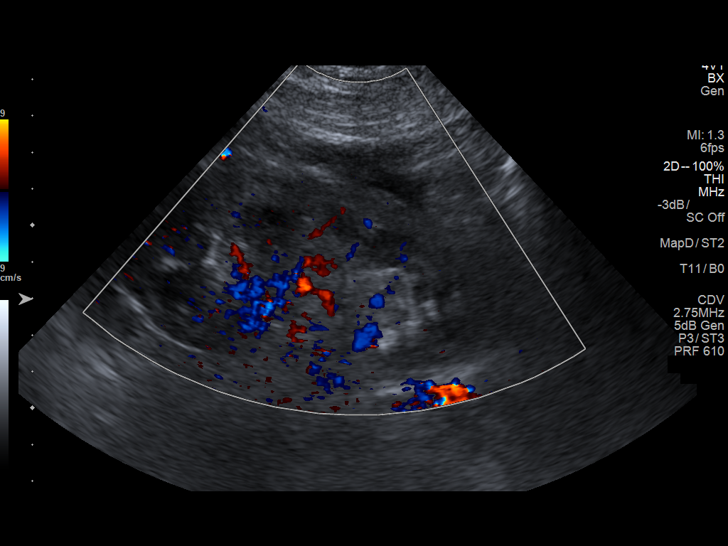
[im 5/24]
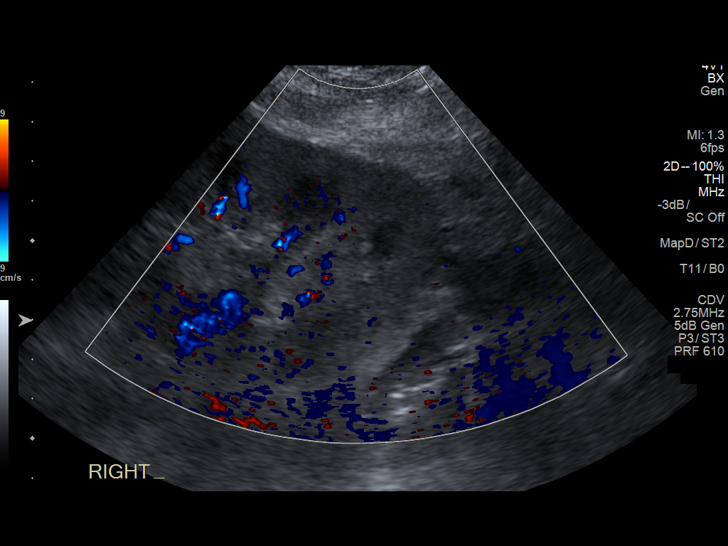
[im 7/24]
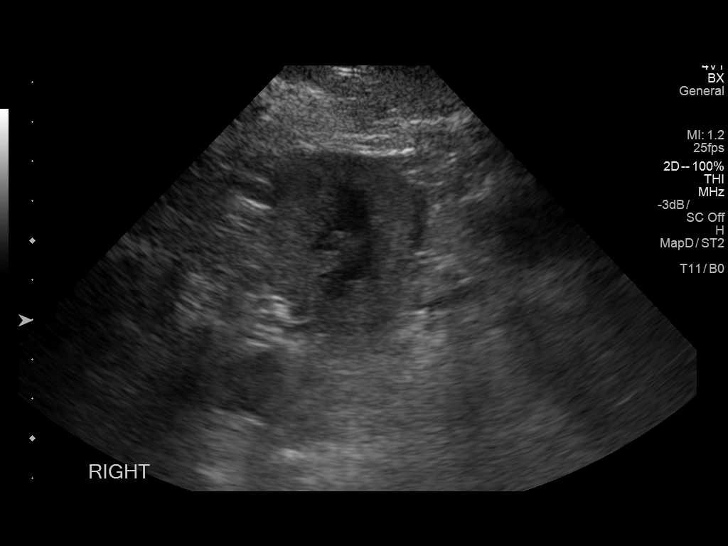
[im 8/24]
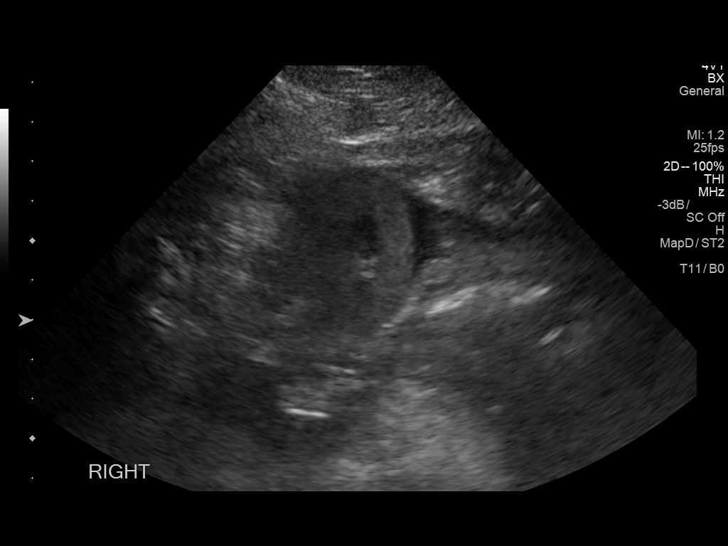
[im 10/24]
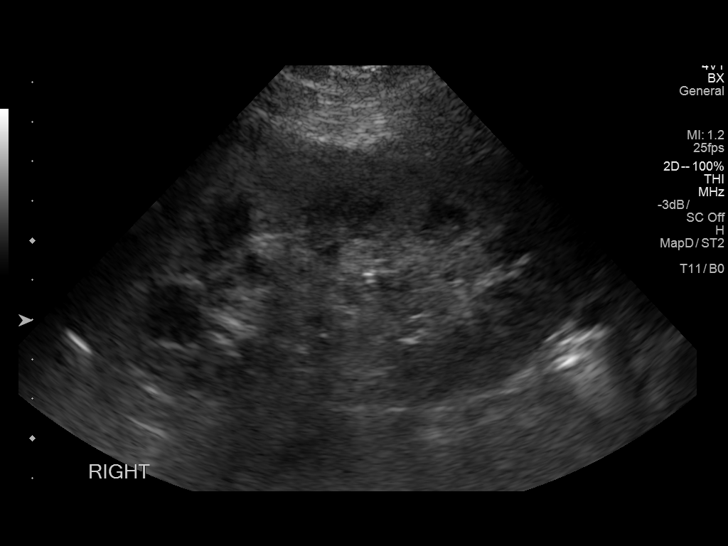
[im 12/24]
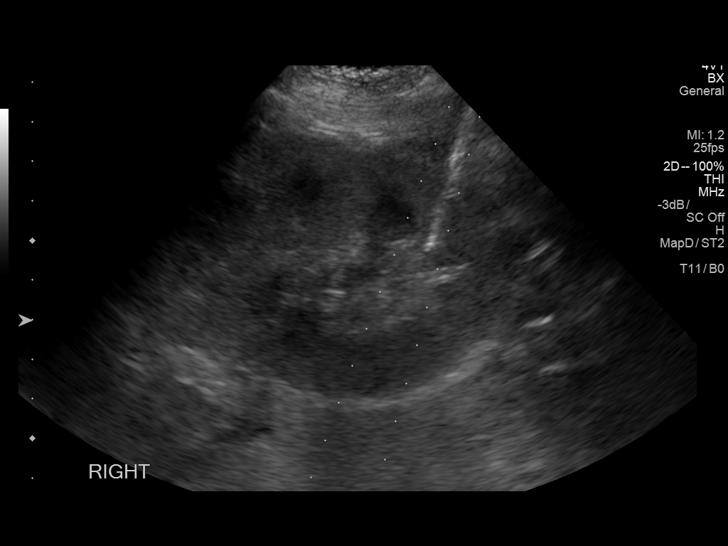
[im 13/24]
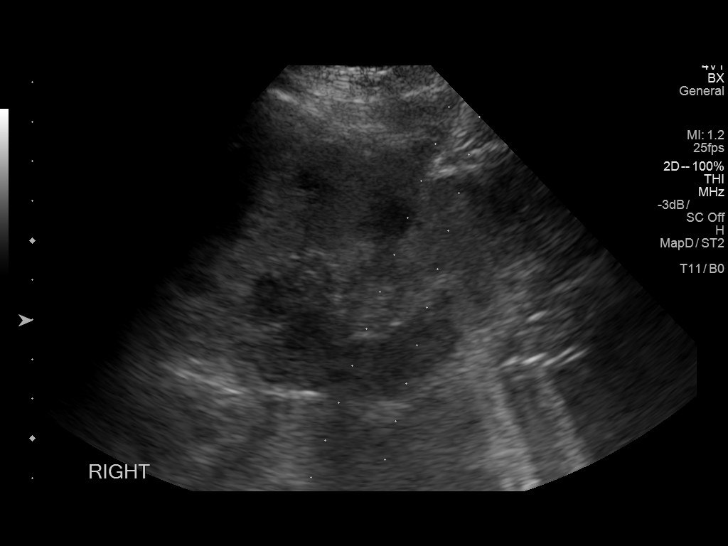
[im 15/24]
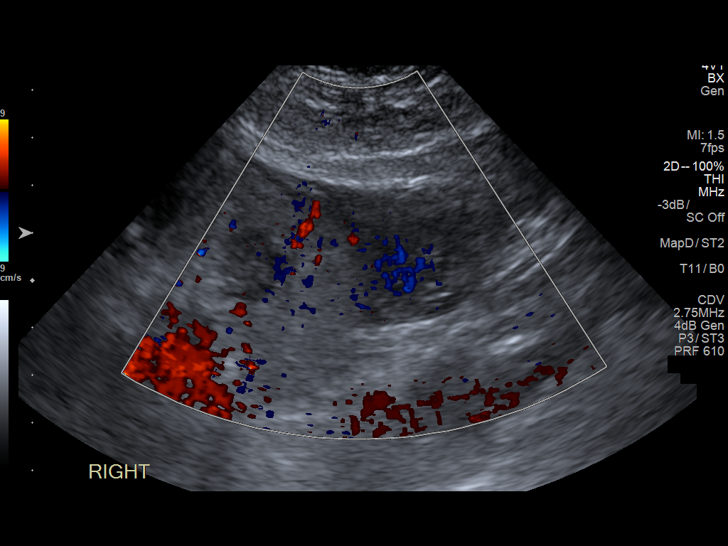
[im 17/24]
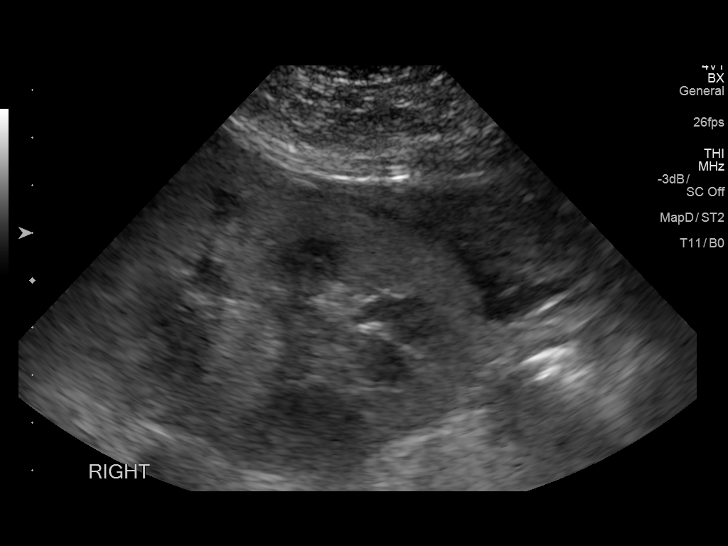
[im 19/24]
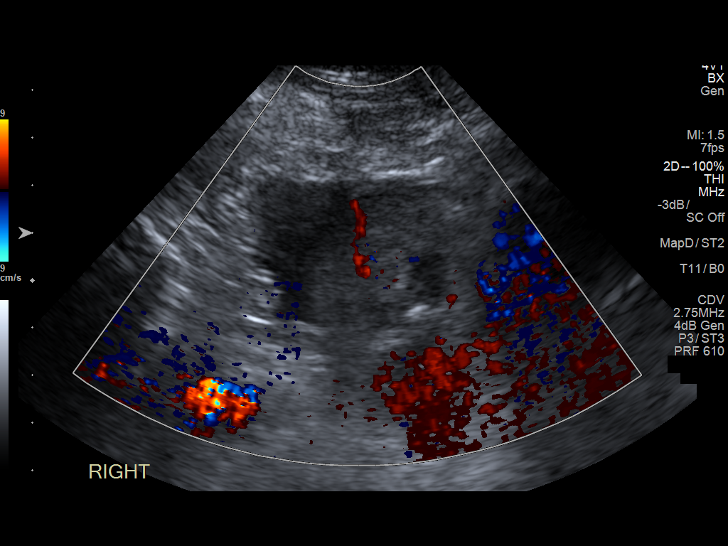
[im 20/24]
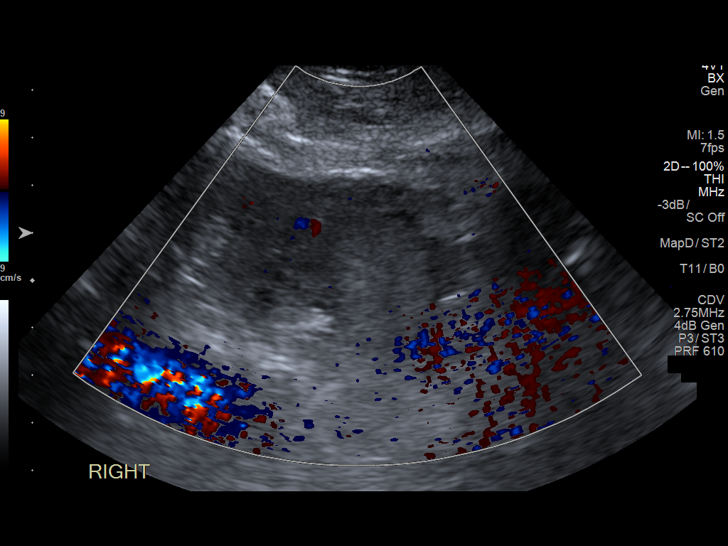
[im 22/24]
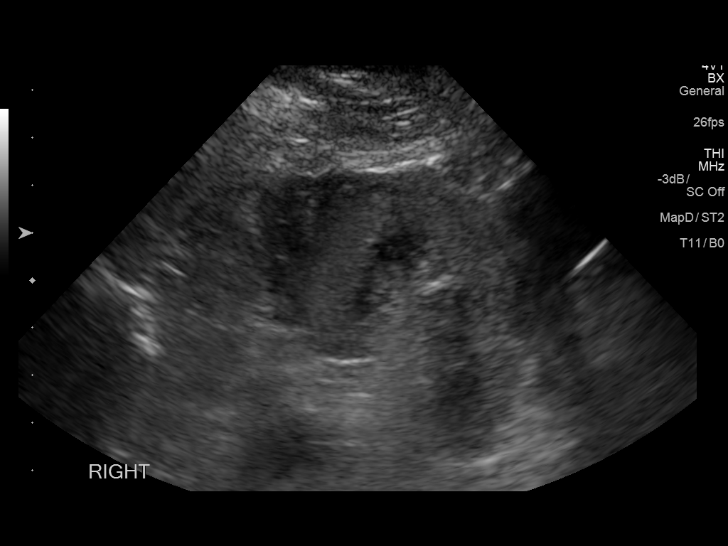
[im 24/24]
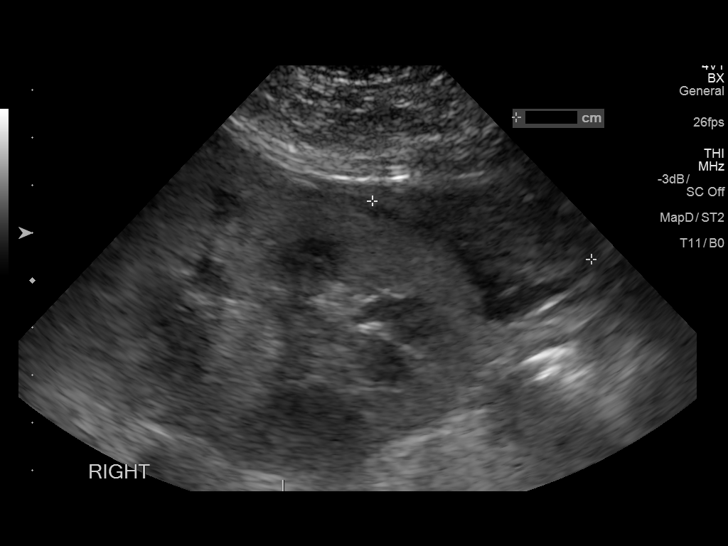

[14 of 24 positions shown; findings below may reference images not displayed]

FINDINGS: Duplex ultrasound performed during ultrasound guided medical renal
biopsy.

Initial image demonstrates trace fluid in the perinephric space.

No hydronephrosis.

Inferior pole of the right kidney targeted for biopsy.

Small amount of heterogeneous capsular fluid present after the
biopsy.
IMPRESSION: Ultrasound-guided right medical renal biopsy with small hematoma
after the procedure.

Please refer to the dictated operative report for full details of
intraoperative findings and procedure.

## 2016-11-21 IMAGING — CR DG CHEST 1V PORT
1 series · 1 of 1 positions shown · non-contrast
Comparison: 04/10/2014

CLINICAL DATA: Increasing shortness of Breath, history of COPD

EXAM:
PORTABLE CHEST - 1 VIEW

[ap]
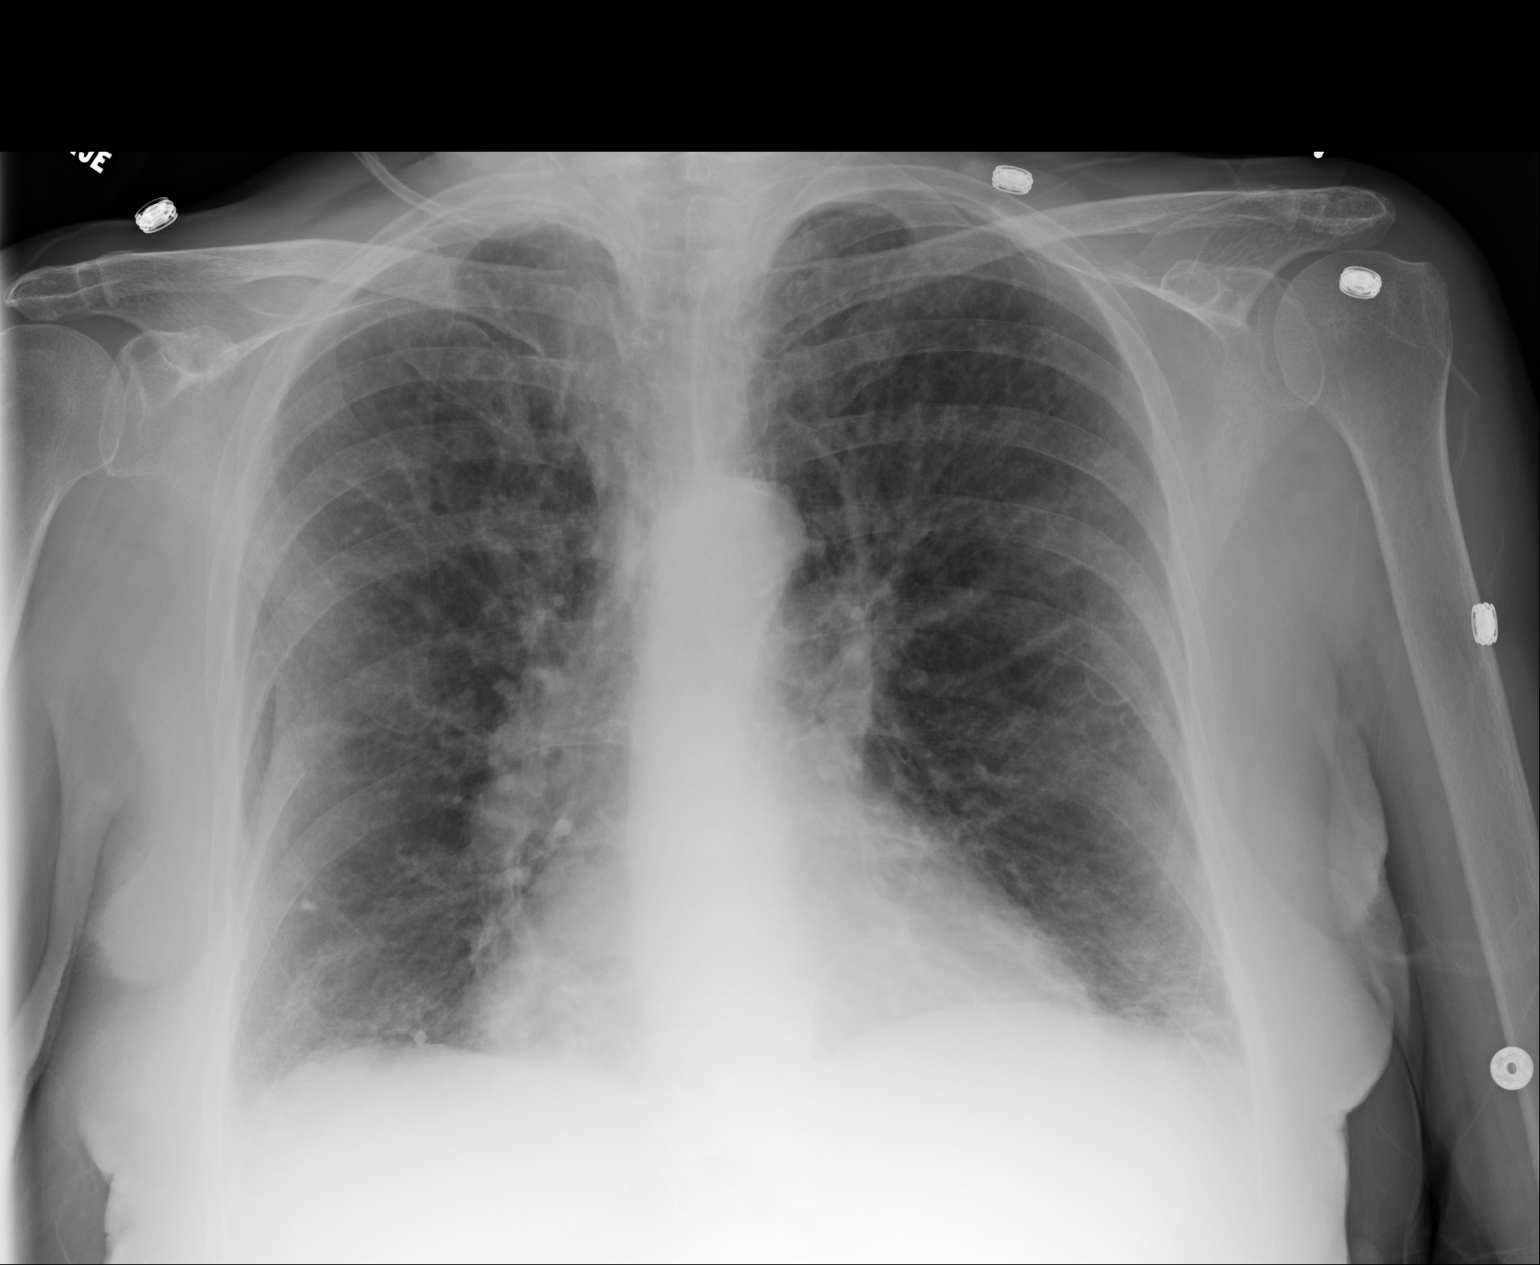

[1 of 1 positions shown; findings below may reference images not displayed]

FINDINGS: Cardiac shadow is again mildly enlarged. Diffuse interstitial
changes are again noted. No focal infiltrate or sizable effusion is
seen. No bony abnormality is noted.
IMPRESSION: Chronic changes without acute abnormality.

## 2017-01-18 IMAGING — CR DG CHEST 2V
1 series · 2 of 2 positions shown · non-contrast
Comparison: 01/28/2015 chest radiograph.

CLINICAL DATA: Av fistula evaluation.

EXAM:
CHEST  2 VIEW

[Series 1: dg chest 2 view · 0.14mm/px · 2 of 2 slices shown]
[im 1/2]
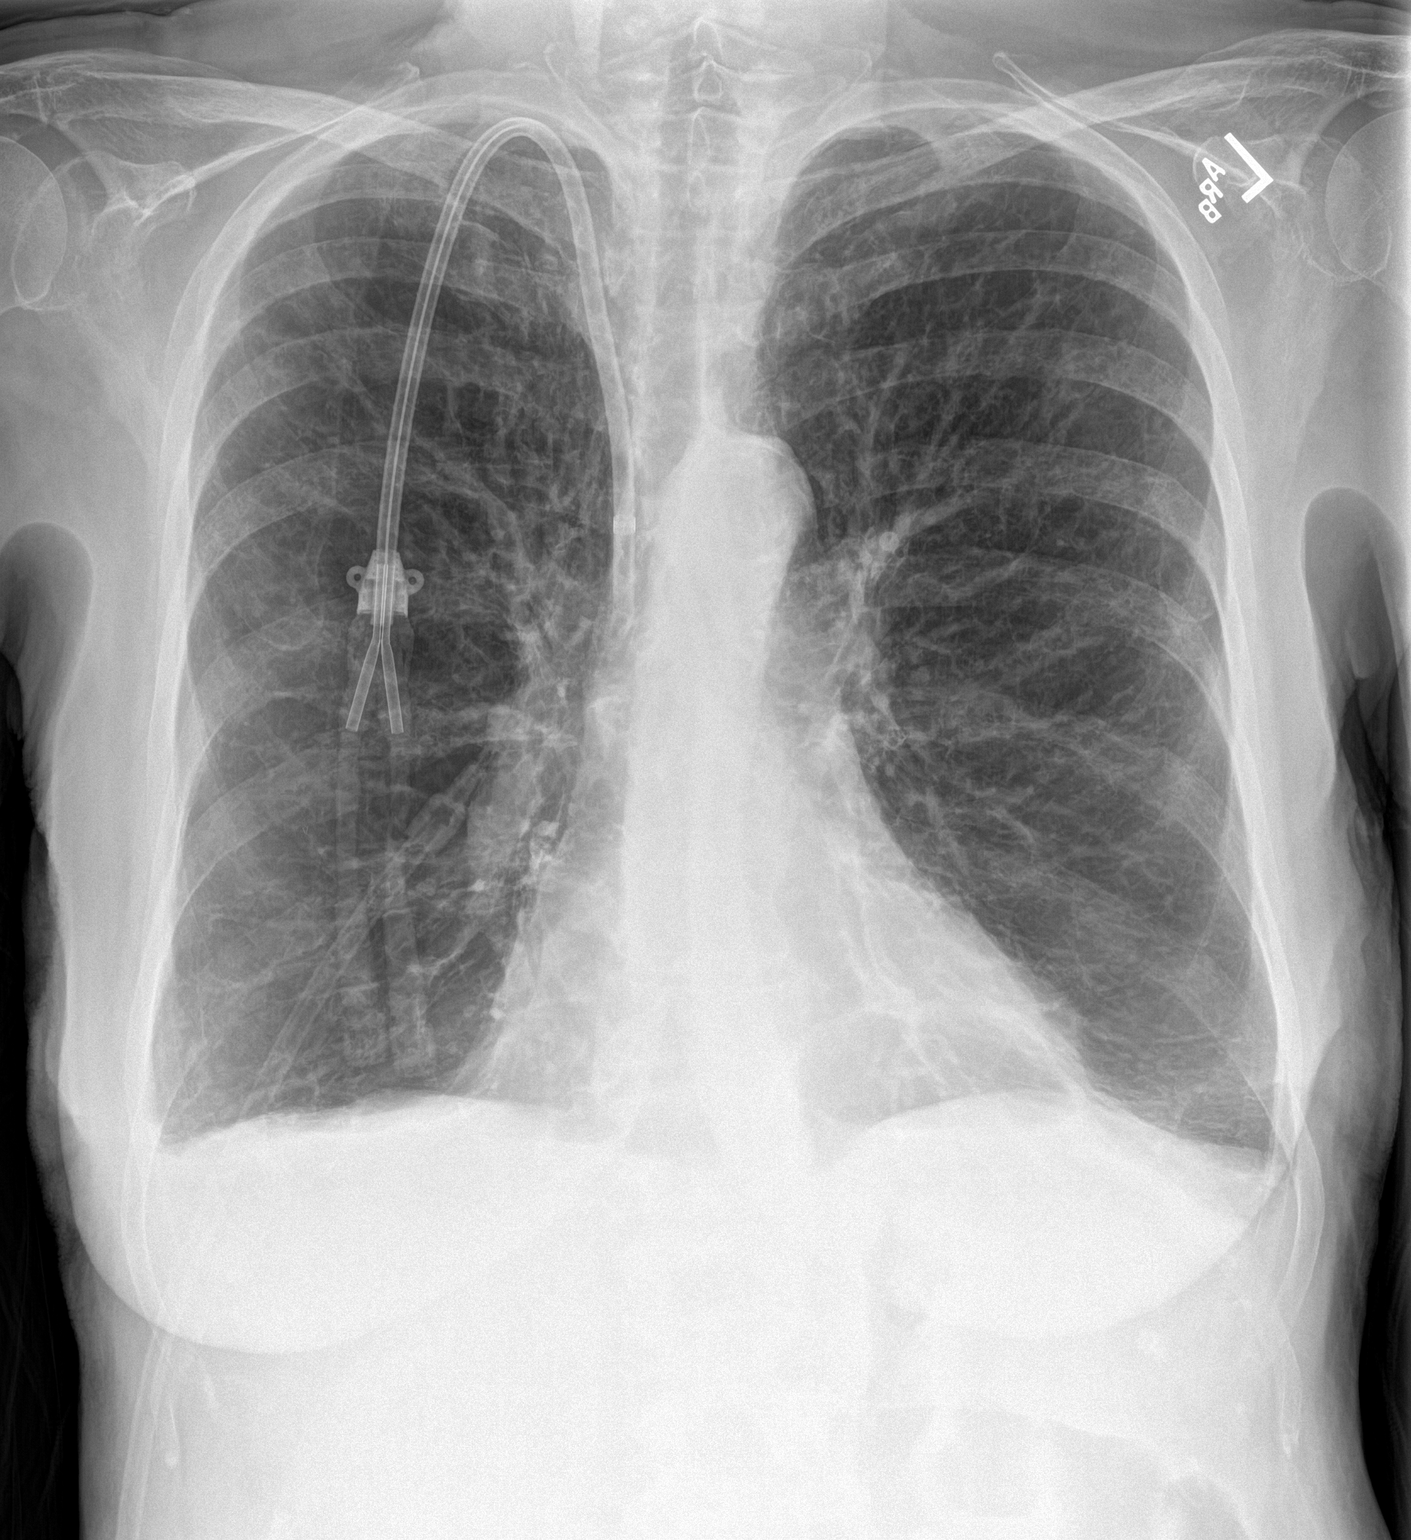
[im 2/2]
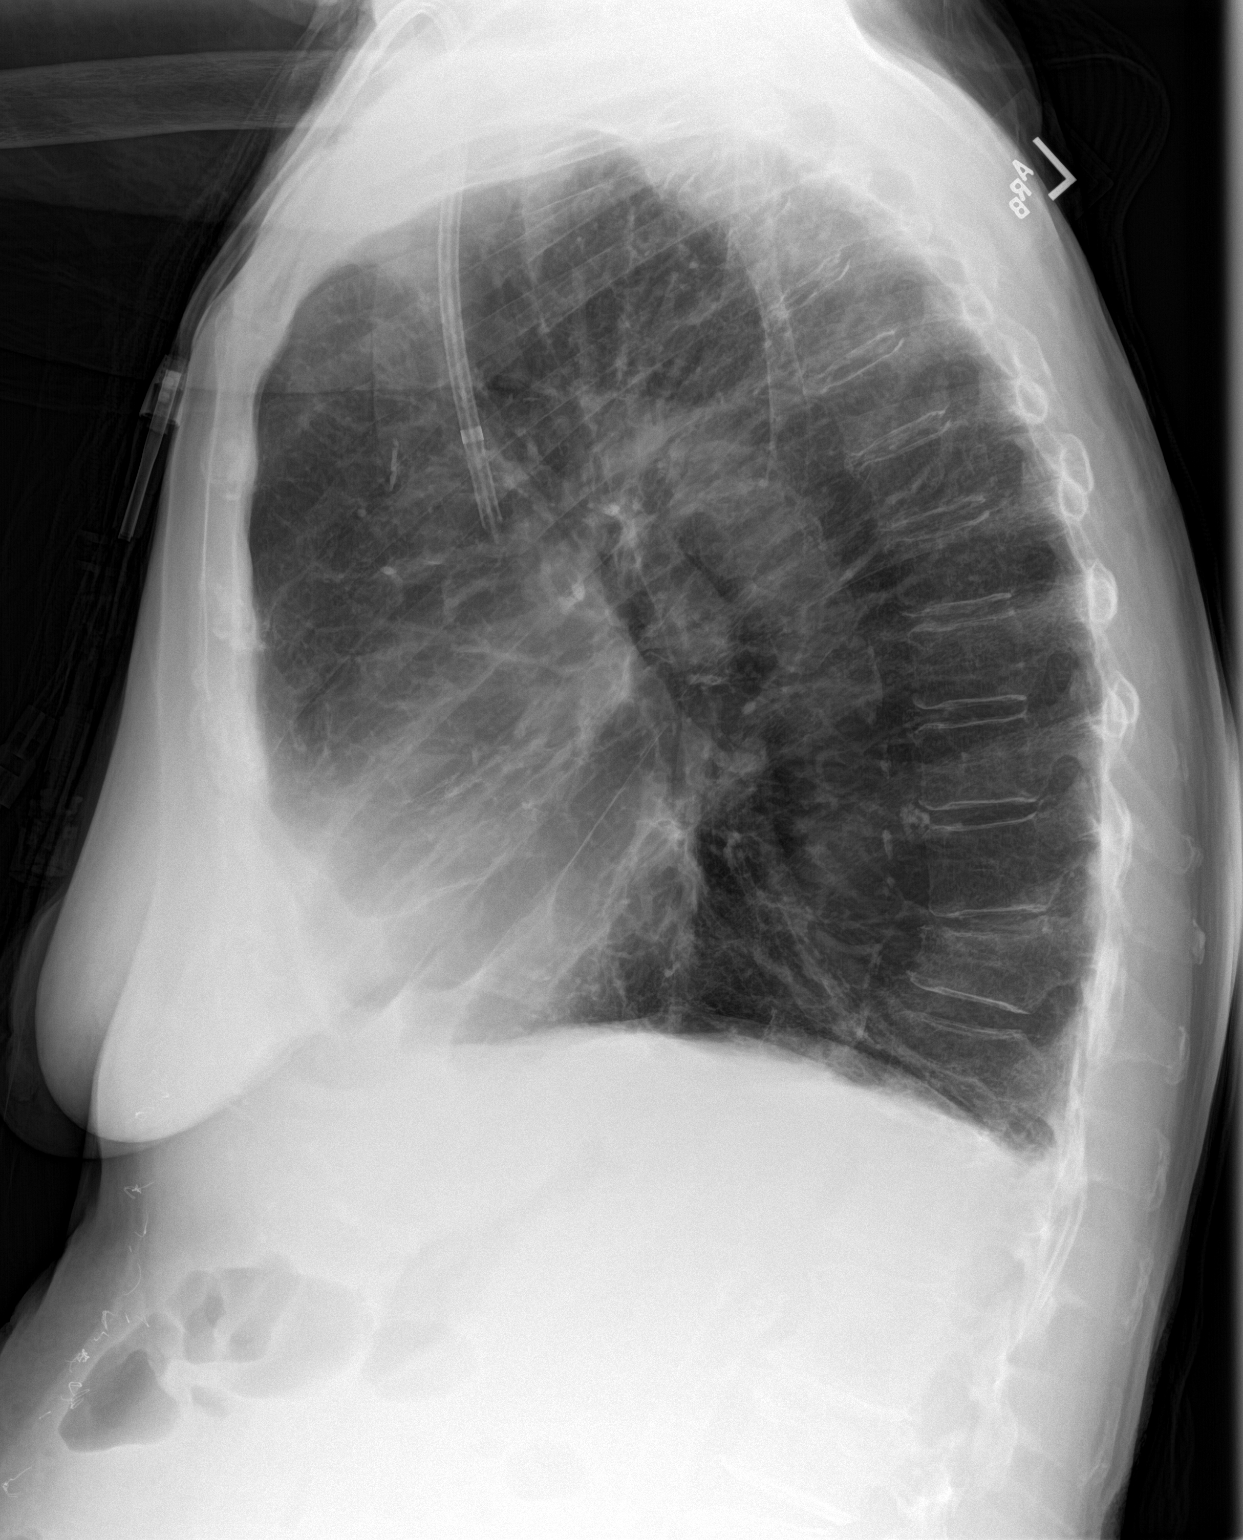

[2 of 2 positions shown; findings below may reference images not displayed]

FINDINGS: Right internal jugular central venous catheter terminates in the
lower third of the superior vena cava. Stable cardiomediastinal
silhouette with normal heart size. No pneumothorax. No pleural
effusion. Hyperinflated lungs. No pulmonary edema. Mild scarring
versus atelectasis at the lung bases. No focal lung consolidation.
Stable chronic mild-to-moderate vertebral compression fracture in
the lower thoracic spine.
IMPRESSION: 1. Hyperinflated lungs, suggesting COPD.
2. Bibasilar mild-to-moderate scarring versus atelectasis. No focal
lung consolidation. No pulmonary edema.

## 2017-01-22 IMAGING — CR DG CHEST 1V PORT
1 series · 1 of 1 positions shown · non-contrast
Comparison: PA and lateral chest 03/21/2015.  CT chest 01/31/2015.

CLINICAL DATA: Hypotension and onset of chest pain and dialysis
today. Initial encounter.

EXAM:
PORTABLE CHEST 1 VIEW

[ap]
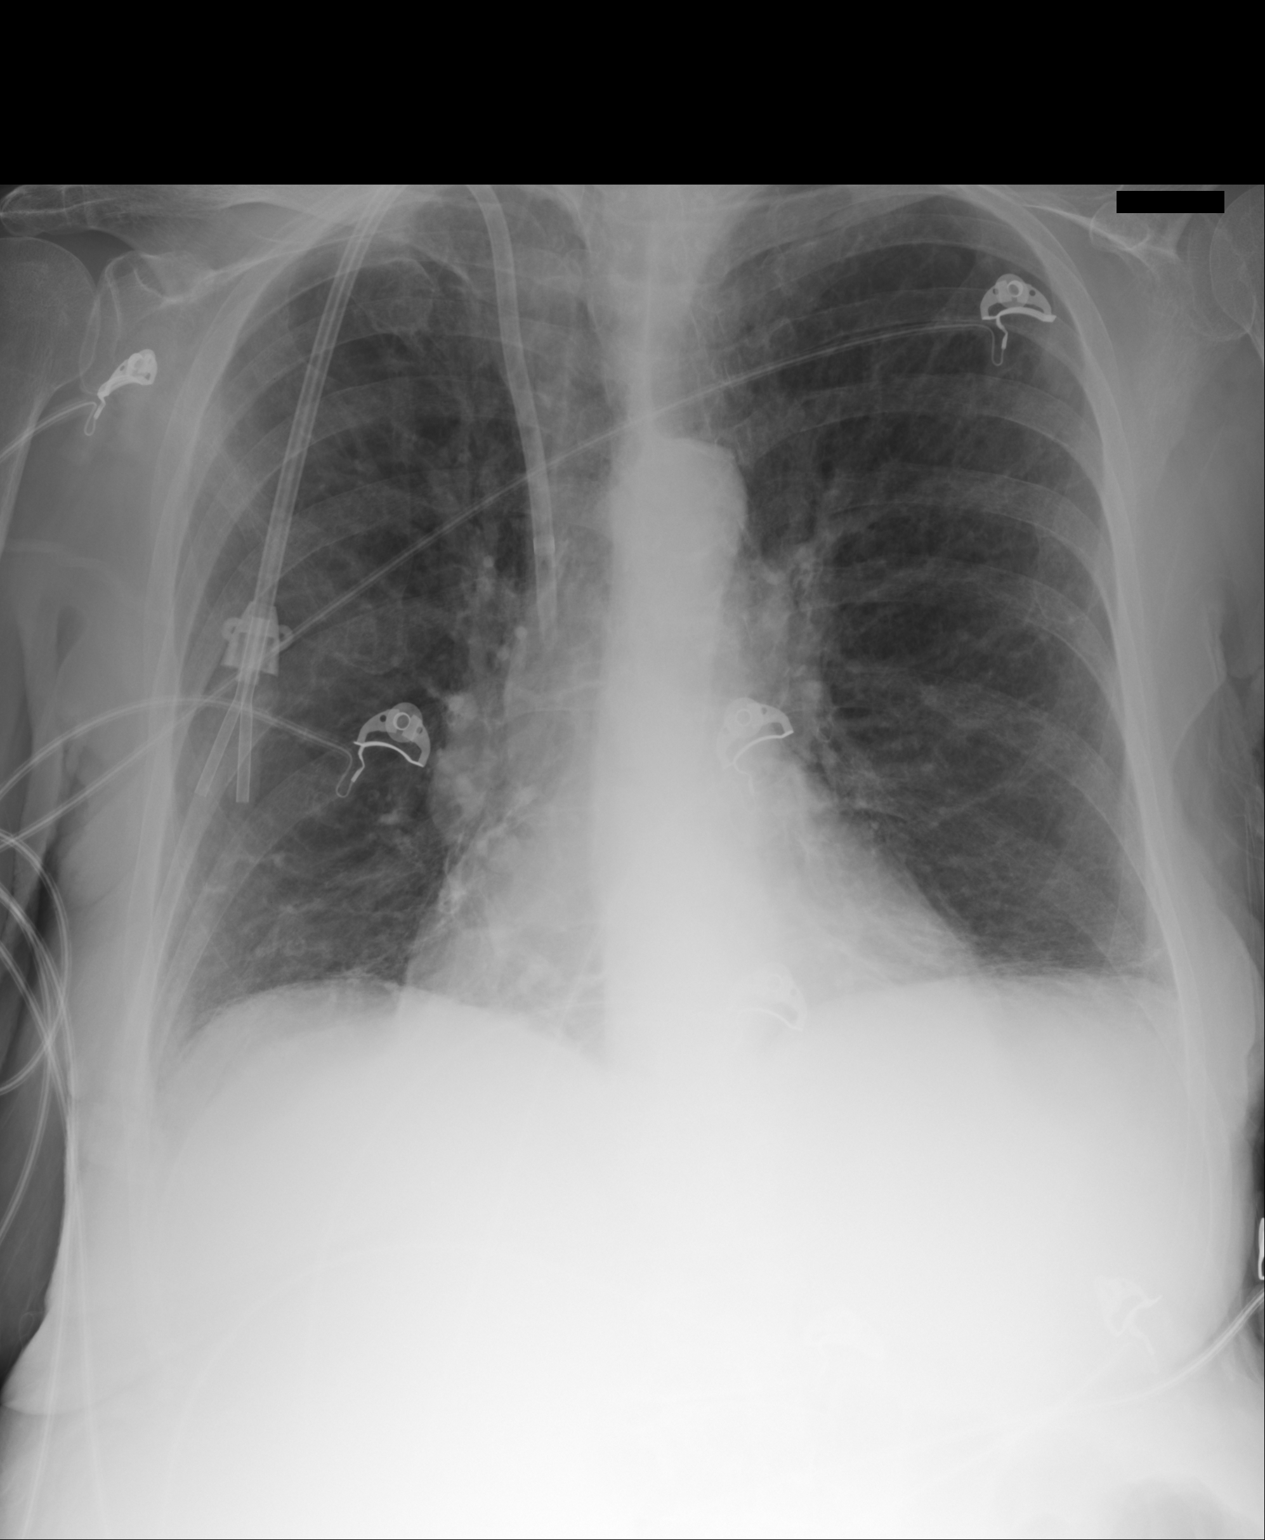

[1 of 1 positions shown; findings below may reference images not displayed]

FINDINGS: The lungs are severely emphysematous. Mild basilar atelectasis is
noted. No pneumothorax or pleural effusion. Heart size is upper
normal.
IMPRESSION: No acute disease.

Emphysema.
